# Patient Record
Sex: Male | Born: 1955 | Race: White | Hispanic: No | Marital: Married | Smoking: Current some day smoker
Health system: Southern US, Community
[De-identification: ages and names within clinical notes are randomized; demographics above are authoritative.]

## PROBLEM LIST (undated history)

## (undated) DIAGNOSIS — E7800 Pure hypercholesterolemia, unspecified: Secondary | ICD-10-CM

## (undated) DIAGNOSIS — G894 Chronic pain syndrome: Secondary | ICD-10-CM

## (undated) DIAGNOSIS — M549 Dorsalgia, unspecified: Secondary | ICD-10-CM

## (undated) DIAGNOSIS — J189 Pneumonia, unspecified organism: Secondary | ICD-10-CM

## (undated) DIAGNOSIS — R0600 Dyspnea, unspecified: Secondary | ICD-10-CM

## (undated) DIAGNOSIS — F419 Anxiety disorder, unspecified: Secondary | ICD-10-CM

## (undated) DIAGNOSIS — M1990 Unspecified osteoarthritis, unspecified site: Secondary | ICD-10-CM

## (undated) DIAGNOSIS — H3530 Unspecified macular degeneration: Secondary | ICD-10-CM

## (undated) DIAGNOSIS — R0609 Other forms of dyspnea: Secondary | ICD-10-CM

## (undated) DIAGNOSIS — I4439 Other atrioventricular block: Secondary | ICD-10-CM

## (undated) DIAGNOSIS — I10 Essential (primary) hypertension: Secondary | ICD-10-CM

## (undated) DIAGNOSIS — G8929 Other chronic pain: Secondary | ICD-10-CM

## (undated) DIAGNOSIS — I219 Acute myocardial infarction, unspecified: Secondary | ICD-10-CM

## (undated) DIAGNOSIS — I442 Atrioventricular block, complete: Secondary | ICD-10-CM

## (undated) DIAGNOSIS — I5033 Acute on chronic diastolic (congestive) heart failure: Secondary | ICD-10-CM

## (undated) DIAGNOSIS — J42 Unspecified chronic bronchitis: Secondary | ICD-10-CM

## (undated) DIAGNOSIS — J449 Chronic obstructive pulmonary disease, unspecified: Secondary | ICD-10-CM

## (undated) DIAGNOSIS — J439 Emphysema, unspecified: Secondary | ICD-10-CM

## (undated) DIAGNOSIS — Z8719 Personal history of other diseases of the digestive system: Secondary | ICD-10-CM

## (undated) DIAGNOSIS — K760 Fatty (change of) liver, not elsewhere classified: Secondary | ICD-10-CM

## (undated) DIAGNOSIS — I509 Heart failure, unspecified: Secondary | ICD-10-CM

## (undated) HISTORY — PX: JOINT REPLACEMENT: SHX530

## (undated) HISTORY — PX: BACK SURGERY: SHX140

## (undated) HISTORY — PX: CARDIAC CATHETERIZATION: SHX172

## (undated) HISTORY — DX: Other forms of dyspnea: R06.09

## (undated) HISTORY — PX: SHOULDER ARTHROSCOPY WITH ROTATOR CUFF REPAIR: SHX5685

## (undated) HISTORY — DX: Other atrioventricular block: I44.39

---

## 1986-12-09 HISTORY — PX: ANTERIOR CERVICAL DECOMP/DISCECTOMY FUSION: SHX1161

## 1998-10-18 ENCOUNTER — Encounter: Admission: RE | Admit: 1998-10-18 | Discharge: 1998-10-18 | Payer: Self-pay | Admitting: Family Medicine

## 1998-11-13 ENCOUNTER — Encounter: Admission: RE | Admit: 1998-11-13 | Discharge: 1998-11-13 | Payer: Self-pay | Admitting: Sports Medicine

## 1999-03-07 ENCOUNTER — Encounter: Admission: RE | Admit: 1999-03-07 | Discharge: 1999-03-07 | Payer: Self-pay | Admitting: Sports Medicine

## 1999-07-10 ENCOUNTER — Encounter: Admission: RE | Admit: 1999-07-10 | Discharge: 1999-07-10 | Payer: Self-pay | Admitting: Family Medicine

## 1999-08-09 ENCOUNTER — Encounter: Admission: RE | Admit: 1999-08-09 | Discharge: 1999-08-09 | Payer: Self-pay | Admitting: Family Medicine

## 1999-09-27 ENCOUNTER — Encounter: Admission: RE | Admit: 1999-09-27 | Discharge: 1999-09-27 | Payer: Self-pay | Admitting: Family Medicine

## 1999-11-23 ENCOUNTER — Ambulatory Visit (HOSPITAL_COMMUNITY): Admission: RE | Admit: 1999-11-23 | Discharge: 1999-11-23 | Payer: Self-pay | Admitting: Family Medicine

## 1999-11-23 ENCOUNTER — Encounter: Admission: RE | Admit: 1999-11-23 | Discharge: 1999-11-23 | Payer: Self-pay | Admitting: Family Medicine

## 1999-12-17 ENCOUNTER — Ambulatory Visit (HOSPITAL_COMMUNITY): Admission: RE | Admit: 1999-12-17 | Discharge: 1999-12-17 | Payer: Self-pay | Admitting: Sports Medicine

## 2000-01-07 ENCOUNTER — Encounter: Admission: RE | Admit: 2000-01-07 | Discharge: 2000-01-07 | Payer: Self-pay | Admitting: Family Medicine

## 2001-03-23 ENCOUNTER — Encounter: Admission: RE | Admit: 2001-03-23 | Discharge: 2001-03-23 | Payer: Self-pay | Admitting: Family Medicine

## 2001-04-09 ENCOUNTER — Encounter: Admission: RE | Admit: 2001-04-09 | Discharge: 2001-04-09 | Payer: Self-pay | Admitting: Family Medicine

## 2001-05-15 ENCOUNTER — Encounter: Admission: RE | Admit: 2001-05-15 | Discharge: 2001-05-15 | Payer: Self-pay | Admitting: Family Medicine

## 2002-03-02 ENCOUNTER — Ambulatory Visit (HOSPITAL_BASED_OUTPATIENT_CLINIC_OR_DEPARTMENT_OTHER): Admission: RE | Admit: 2002-03-02 | Discharge: 2002-03-02 | Payer: Self-pay | Admitting: Orthopaedic Surgery

## 2002-10-11 ENCOUNTER — Inpatient Hospital Stay (HOSPITAL_COMMUNITY): Admission: RE | Admit: 2002-10-11 | Discharge: 2002-10-12 | Payer: Self-pay | Admitting: Neurosurgery

## 2002-10-11 ENCOUNTER — Encounter: Payer: Self-pay | Admitting: Neurosurgery

## 2003-01-17 ENCOUNTER — Encounter: Admission: RE | Admit: 2003-01-17 | Discharge: 2003-01-17 | Payer: Self-pay | Admitting: Family Medicine

## 2003-02-09 ENCOUNTER — Encounter: Payer: Self-pay | Admitting: Emergency Medicine

## 2003-02-09 ENCOUNTER — Observation Stay (HOSPITAL_COMMUNITY): Admission: EM | Admit: 2003-02-09 | Discharge: 2003-02-10 | Payer: Self-pay | Admitting: Emergency Medicine

## 2003-02-23 ENCOUNTER — Encounter: Admission: RE | Admit: 2003-02-23 | Discharge: 2003-02-23 | Payer: Self-pay | Admitting: Family Medicine

## 2003-02-28 ENCOUNTER — Encounter: Admission: RE | Admit: 2003-02-28 | Discharge: 2003-02-28 | Payer: Self-pay | Admitting: Family Medicine

## 2003-03-14 ENCOUNTER — Encounter: Admission: RE | Admit: 2003-03-14 | Discharge: 2003-03-14 | Payer: Self-pay | Admitting: Family Medicine

## 2003-04-04 ENCOUNTER — Encounter: Admission: RE | Admit: 2003-04-04 | Discharge: 2003-04-04 | Payer: Self-pay | Admitting: Family Medicine

## 2003-04-26 ENCOUNTER — Encounter: Admission: RE | Admit: 2003-04-26 | Discharge: 2003-04-26 | Payer: Self-pay | Admitting: Family Medicine

## 2003-05-27 ENCOUNTER — Encounter: Admission: RE | Admit: 2003-05-27 | Discharge: 2003-05-27 | Payer: Self-pay | Admitting: Family Medicine

## 2003-06-29 ENCOUNTER — Encounter: Admission: RE | Admit: 2003-06-29 | Discharge: 2003-06-29 | Payer: Self-pay | Admitting: Family Medicine

## 2003-08-09 ENCOUNTER — Encounter: Admission: RE | Admit: 2003-08-09 | Discharge: 2003-08-09 | Payer: Self-pay | Admitting: Family Medicine

## 2003-09-08 ENCOUNTER — Encounter: Admission: RE | Admit: 2003-09-08 | Discharge: 2003-09-08 | Payer: Self-pay | Admitting: Family Medicine

## 2003-10-11 ENCOUNTER — Encounter: Admission: RE | Admit: 2003-10-11 | Discharge: 2003-10-11 | Payer: Self-pay | Admitting: Sports Medicine

## 2003-11-10 ENCOUNTER — Encounter: Admission: RE | Admit: 2003-11-10 | Discharge: 2003-11-10 | Payer: Self-pay | Admitting: Family Medicine

## 2003-12-15 ENCOUNTER — Encounter: Admission: RE | Admit: 2003-12-15 | Discharge: 2003-12-15 | Payer: Self-pay | Admitting: Family Medicine

## 2004-01-11 ENCOUNTER — Encounter: Admission: RE | Admit: 2004-01-11 | Discharge: 2004-01-11 | Payer: Self-pay | Admitting: Family Medicine

## 2004-02-24 ENCOUNTER — Encounter: Admission: RE | Admit: 2004-02-24 | Discharge: 2004-02-24 | Payer: Self-pay | Admitting: Sports Medicine

## 2004-03-27 ENCOUNTER — Encounter: Admission: RE | Admit: 2004-03-27 | Discharge: 2004-03-27 | Payer: Self-pay | Admitting: Family Medicine

## 2004-12-28 ENCOUNTER — Ambulatory Visit: Payer: Self-pay | Admitting: Sports Medicine

## 2005-06-10 ENCOUNTER — Ambulatory Visit: Payer: Self-pay | Admitting: Family Medicine

## 2005-07-23 ENCOUNTER — Ambulatory Visit: Payer: Self-pay | Admitting: Family Medicine

## 2005-09-13 ENCOUNTER — Ambulatory Visit: Payer: Self-pay | Admitting: Family Medicine

## 2005-12-30 ENCOUNTER — Ambulatory Visit: Payer: Self-pay | Admitting: Sports Medicine

## 2006-01-30 ENCOUNTER — Ambulatory Visit: Payer: Self-pay | Admitting: Family Medicine

## 2006-02-26 ENCOUNTER — Ambulatory Visit: Payer: Self-pay | Admitting: Family Medicine

## 2006-11-04 ENCOUNTER — Ambulatory Visit (HOSPITAL_BASED_OUTPATIENT_CLINIC_OR_DEPARTMENT_OTHER): Admission: RE | Admit: 2006-11-04 | Discharge: 2006-11-04 | Payer: Self-pay | Admitting: Orthopaedic Surgery

## 2007-01-07 ENCOUNTER — Ambulatory Visit: Payer: Self-pay | Admitting: Family Medicine

## 2007-02-16 ENCOUNTER — Ambulatory Visit: Payer: Self-pay | Admitting: Family Medicine

## 2007-02-16 ENCOUNTER — Encounter (INDEPENDENT_AMBULATORY_CARE_PROVIDER_SITE_OTHER): Payer: Self-pay | Admitting: *Deleted

## 2007-02-16 DIAGNOSIS — E7800 Pure hypercholesterolemia, unspecified: Secondary | ICD-10-CM | POA: Insufficient documentation

## 2007-02-16 DIAGNOSIS — I10 Essential (primary) hypertension: Secondary | ICD-10-CM | POA: Insufficient documentation

## 2007-02-18 ENCOUNTER — Encounter (INDEPENDENT_AMBULATORY_CARE_PROVIDER_SITE_OTHER): Payer: Self-pay | Admitting: *Deleted

## 2007-02-18 LAB — CONVERTED CEMR LAB
ALT: 18 units/L (ref 0–53)
AST: 16 units/L (ref 0–37)
Albumin: 4.2 g/dL (ref 3.5–5.2)
Alkaline Phosphatase: 65 units/L (ref 39–117)
BUN: 9 mg/dL (ref 6–23)
CO2: 21 meq/L (ref 19–32)
Calcium: 9.3 mg/dL (ref 8.4–10.5)
Chloride: 104 meq/L (ref 96–112)
Cholesterol: 181 mg/dL (ref 0–200)
Creatinine, Ser: 0.77 mg/dL (ref 0.40–1.50)
Glucose, Bld: 100 mg/dL — ABNORMAL HIGH (ref 70–99)
HDL: 32 mg/dL — ABNORMAL LOW (ref >39)
LDL Cholesterol: 113 mg/dL — ABNORMAL HIGH (ref 0–99)
Potassium: 4.9 meq/L (ref 3.5–5.3)
Sodium: 140 meq/L (ref 135–145)
TSH: 1.158 microintl units/mL (ref 0.350–5.50)
Total Bilirubin: 0.5 mg/dL (ref 0.3–1.2)
Total CHOL/HDL Ratio: 5.7
Total Protein: 7 g/dL (ref 6.0–8.3)
Triglycerides: 180 mg/dL — ABNORMAL HIGH (ref <150)
VLDL: 36 mg/dL (ref 0–40)

## 2007-04-07 ENCOUNTER — Ambulatory Visit: Payer: Self-pay | Admitting: Family Medicine

## 2007-04-07 ENCOUNTER — Encounter (INDEPENDENT_AMBULATORY_CARE_PROVIDER_SITE_OTHER): Payer: Self-pay | Admitting: *Deleted

## 2007-04-07 DIAGNOSIS — Z0000 Encounter for general adult medical examination without abnormal findings: Secondary | ICD-10-CM | POA: Insufficient documentation

## 2007-04-07 DIAGNOSIS — F17200 Nicotine dependence, unspecified, uncomplicated: Secondary | ICD-10-CM | POA: Insufficient documentation

## 2007-04-08 LAB — CONVERTED CEMR LAB: Uric Acid, Serum: 5.4 mg/dL (ref 2.4–7.0)

## 2007-05-07 ENCOUNTER — Telehealth (INDEPENDENT_AMBULATORY_CARE_PROVIDER_SITE_OTHER): Payer: Self-pay | Admitting: *Deleted

## 2007-05-27 ENCOUNTER — Inpatient Hospital Stay (HOSPITAL_COMMUNITY): Admission: EM | Admit: 2007-05-27 | Discharge: 2007-05-29 | Payer: Self-pay | Admitting: Family Medicine

## 2007-05-27 ENCOUNTER — Ambulatory Visit: Payer: Self-pay | Admitting: Family Medicine

## 2007-06-19 ENCOUNTER — Ambulatory Visit: Payer: Self-pay | Admitting: Family Medicine

## 2007-06-19 DIAGNOSIS — K219 Gastro-esophageal reflux disease without esophagitis: Secondary | ICD-10-CM | POA: Insufficient documentation

## 2007-06-22 ENCOUNTER — Ambulatory Visit: Payer: Self-pay | Admitting: Family Medicine

## 2007-06-25 ENCOUNTER — Telehealth (INDEPENDENT_AMBULATORY_CARE_PROVIDER_SITE_OTHER): Payer: Self-pay | Admitting: *Deleted

## 2007-06-25 ENCOUNTER — Ambulatory Visit: Payer: Self-pay | Admitting: Family Medicine

## 2007-09-23 ENCOUNTER — Ambulatory Visit: Payer: Self-pay | Admitting: Family Medicine

## 2007-09-23 ENCOUNTER — Telehealth (INDEPENDENT_AMBULATORY_CARE_PROVIDER_SITE_OTHER): Payer: Self-pay | Admitting: *Deleted

## 2007-10-07 ENCOUNTER — Ambulatory Visit: Payer: Self-pay | Admitting: Family Medicine

## 2007-10-15 ENCOUNTER — Ambulatory Visit: Payer: Self-pay | Admitting: Family Medicine

## 2007-10-15 ENCOUNTER — Encounter: Admission: RE | Admit: 2007-10-15 | Discharge: 2007-10-15 | Payer: Self-pay | Admitting: Sports Medicine

## 2007-10-15 ENCOUNTER — Encounter (INDEPENDENT_AMBULATORY_CARE_PROVIDER_SITE_OTHER): Payer: Self-pay | Admitting: Family Medicine

## 2007-10-15 LAB — CONVERTED CEMR LAB
Basophils Absolute: 0 10*3/uL (ref 0.0–0.1)
Basophils Relative: 0 % (ref 0–1)
Eosinophils Absolute: 0.2 10*3/uL (ref 0.0–0.7)
Eosinophils Relative: 2 % (ref 0–5)
HCT: 43.2 % (ref 39.0–52.0)
Hemoglobin: 14.1 g/dL (ref 13.0–17.0)
Lymphocytes Relative: 38 % (ref 12–46)
Lymphs Abs: 3.7 10*3/uL — ABNORMAL HIGH (ref 0.7–3.3)
MCHC: 32.6 g/dL (ref 30.0–36.0)
MCV: 93.5 fL (ref 78.0–100.0)
Monocytes Absolute: 0.9 10*3/uL — ABNORMAL HIGH (ref 0.2–0.7)
Monocytes Relative: 9 % (ref 3–11)
Neutro Abs: 4.9 10*3/uL (ref 1.7–7.7)
Neutrophils Relative %: 51 % (ref 43–77)
Platelets: 274 10*3/uL (ref 150–400)
RBC: 4.62 M/uL (ref 4.22–5.81)
RDW: 13.8 % (ref 11.5–14.0)
WBC: 9.7 10*3/uL (ref 4.0–10.5)

## 2007-11-13 ENCOUNTER — Ambulatory Visit: Payer: Self-pay | Admitting: Family Medicine

## 2008-01-06 ENCOUNTER — Telehealth: Payer: Self-pay | Admitting: *Deleted

## 2008-01-06 ENCOUNTER — Ambulatory Visit: Payer: Self-pay | Admitting: Family Medicine

## 2008-01-19 ENCOUNTER — Telehealth (INDEPENDENT_AMBULATORY_CARE_PROVIDER_SITE_OTHER): Payer: Self-pay | Admitting: *Deleted

## 2008-01-27 ENCOUNTER — Ambulatory Visit: Payer: Self-pay | Admitting: Family Medicine

## 2008-02-03 ENCOUNTER — Telehealth: Payer: Self-pay | Admitting: *Deleted

## 2008-02-17 ENCOUNTER — Telehealth (INDEPENDENT_AMBULATORY_CARE_PROVIDER_SITE_OTHER): Payer: Self-pay | Admitting: *Deleted

## 2008-02-27 ENCOUNTER — Telehealth (INDEPENDENT_AMBULATORY_CARE_PROVIDER_SITE_OTHER): Payer: Self-pay | Admitting: Family Medicine

## 2008-02-27 ENCOUNTER — Emergency Department (HOSPITAL_COMMUNITY): Admission: EM | Admit: 2008-02-27 | Discharge: 2008-02-27 | Payer: Self-pay | Admitting: Emergency Medicine

## 2008-04-20 ENCOUNTER — Encounter (INDEPENDENT_AMBULATORY_CARE_PROVIDER_SITE_OTHER): Payer: Self-pay | Admitting: *Deleted

## 2008-04-20 ENCOUNTER — Ambulatory Visit: Payer: Self-pay | Admitting: Family Medicine

## 2008-05-11 ENCOUNTER — Encounter (INDEPENDENT_AMBULATORY_CARE_PROVIDER_SITE_OTHER): Payer: Self-pay | Admitting: *Deleted

## 2008-05-26 ENCOUNTER — Encounter (INDEPENDENT_AMBULATORY_CARE_PROVIDER_SITE_OTHER): Payer: Self-pay | Admitting: *Deleted

## 2008-05-26 ENCOUNTER — Ambulatory Visit: Payer: Self-pay | Admitting: Family Medicine

## 2008-05-31 ENCOUNTER — Encounter (INDEPENDENT_AMBULATORY_CARE_PROVIDER_SITE_OTHER): Payer: Self-pay | Admitting: *Deleted

## 2008-07-13 ENCOUNTER — Telehealth (INDEPENDENT_AMBULATORY_CARE_PROVIDER_SITE_OTHER): Payer: Self-pay | Admitting: Family Medicine

## 2008-07-21 ENCOUNTER — Ambulatory Visit: Payer: Self-pay | Admitting: Family Medicine

## 2008-07-21 ENCOUNTER — Encounter (INDEPENDENT_AMBULATORY_CARE_PROVIDER_SITE_OTHER): Payer: Self-pay | Admitting: Family Medicine

## 2008-07-21 LAB — CONVERTED CEMR LAB

## 2008-07-22 LAB — CONVERTED CEMR LAB
ALT: 10 units/L (ref 0–53)
AST: 11 units/L (ref 0–37)
Albumin: 4.3 g/dL (ref 3.5–5.2)
Alkaline Phosphatase: 66 units/L (ref 39–117)
BUN: 7 mg/dL (ref 6–23)
CO2: 22 meq/L (ref 19–32)
Calcium: 9.2 mg/dL (ref 8.4–10.5)
Chloride: 107 meq/L (ref 96–112)
Creatinine, Ser: 0.54 mg/dL (ref 0.40–1.50)
Direct LDL: 85 mg/dL
Glucose, Bld: 94 mg/dL (ref 70–99)
PSA: 0.15 ng/mL (ref 0.10–4.00)
Potassium: 4.2 meq/L (ref 3.5–5.3)
Sodium: 140 meq/L (ref 135–145)
Total Bilirubin: 0.4 mg/dL (ref 0.3–1.2)
Total Protein: 7.1 g/dL (ref 6.0–8.3)

## 2008-08-09 ENCOUNTER — Telehealth (INDEPENDENT_AMBULATORY_CARE_PROVIDER_SITE_OTHER): Payer: Self-pay | Admitting: *Deleted

## 2008-08-25 ENCOUNTER — Ambulatory Visit: Payer: Self-pay | Admitting: Family Medicine

## 2008-08-25 DIAGNOSIS — M159 Polyosteoarthritis, unspecified: Secondary | ICD-10-CM

## 2008-09-29 ENCOUNTER — Telehealth (INDEPENDENT_AMBULATORY_CARE_PROVIDER_SITE_OTHER): Payer: Self-pay | Admitting: *Deleted

## 2008-11-21 ENCOUNTER — Telehealth: Payer: Self-pay | Admitting: *Deleted

## 2008-11-29 ENCOUNTER — Ambulatory Visit: Payer: Self-pay | Admitting: Family Medicine

## 2008-11-29 ENCOUNTER — Encounter (INDEPENDENT_AMBULATORY_CARE_PROVIDER_SITE_OTHER): Payer: Self-pay | Admitting: Family Medicine

## 2008-11-30 LAB — CONVERTED CEMR LAB
Albumin: 4.1 g/dL (ref 3.5–5.2)
Alkaline Phosphatase: 74 units/L (ref 39–117)
BUN: 6 mg/dL (ref 6–23)
Chloride: 106 meq/L (ref 96–112)
Creatinine, Ser: 0.58 mg/dL (ref 0.40–1.50)
Glucose, Bld: 92 mg/dL (ref 70–99)
HDL: 44 mg/dL (ref >39)
LDL Cholesterol: 65 mg/dL (ref 0–99)
Sodium: 141 meq/L (ref 135–145)
Total Bilirubin: 0.4 mg/dL (ref 0.3–1.2)
Total CHOL/HDL Ratio: 2.9
Triglycerides: 98 mg/dL (ref <150)

## 2008-12-14 ENCOUNTER — Telehealth (INDEPENDENT_AMBULATORY_CARE_PROVIDER_SITE_OTHER): Payer: Self-pay | Admitting: *Deleted

## 2008-12-30 ENCOUNTER — Ambulatory Visit: Payer: Self-pay | Admitting: Family Medicine

## 2009-02-08 ENCOUNTER — Ambulatory Visit: Payer: Self-pay | Admitting: Family Medicine

## 2009-02-24 ENCOUNTER — Telehealth (INDEPENDENT_AMBULATORY_CARE_PROVIDER_SITE_OTHER): Payer: Self-pay | Admitting: Family Medicine

## 2009-03-27 ENCOUNTER — Telehealth: Payer: Self-pay | Admitting: *Deleted

## 2009-04-25 ENCOUNTER — Telehealth: Payer: Self-pay | Admitting: *Deleted

## 2009-05-24 ENCOUNTER — Ambulatory Visit: Payer: Self-pay | Admitting: Family Medicine

## 2009-05-24 DIAGNOSIS — F411 Generalized anxiety disorder: Secondary | ICD-10-CM

## 2009-06-23 ENCOUNTER — Telehealth: Payer: Self-pay | Admitting: *Deleted

## 2009-07-03 ENCOUNTER — Ambulatory Visit: Payer: Self-pay | Admitting: Family Medicine

## 2009-07-03 ENCOUNTER — Telehealth: Payer: Self-pay | Admitting: Family Medicine

## 2009-07-04 ENCOUNTER — Telehealth: Payer: Self-pay | Admitting: Family Medicine

## 2009-07-13 ENCOUNTER — Ambulatory Visit: Payer: Self-pay | Admitting: Family Medicine

## 2009-07-19 ENCOUNTER — Encounter: Payer: Self-pay | Admitting: Family Medicine

## 2009-08-23 ENCOUNTER — Telehealth: Payer: Self-pay | Admitting: *Deleted

## 2009-10-13 ENCOUNTER — Telehealth: Payer: Self-pay | Admitting: *Deleted

## 2009-11-22 ENCOUNTER — Telehealth: Payer: Self-pay | Admitting: Family Medicine

## 2009-12-06 ENCOUNTER — Ambulatory Visit: Payer: Self-pay | Admitting: Family Medicine

## 2009-12-27 ENCOUNTER — Ambulatory Visit: Payer: Self-pay | Admitting: Family Medicine

## 2009-12-27 ENCOUNTER — Telehealth: Payer: Self-pay | Admitting: Family Medicine

## 2010-03-26 ENCOUNTER — Telehealth: Payer: Self-pay | Admitting: Family Medicine

## 2010-05-10 ENCOUNTER — Encounter: Payer: Self-pay | Admitting: Family Medicine

## 2010-05-22 ENCOUNTER — Telehealth: Payer: Self-pay | Admitting: *Deleted

## 2010-06-05 ENCOUNTER — Encounter: Payer: Self-pay | Admitting: Sports Medicine

## 2010-06-05 ENCOUNTER — Ambulatory Visit: Payer: Self-pay | Admitting: Family Medicine

## 2010-10-15 ENCOUNTER — Encounter: Payer: Self-pay | Admitting: Sports Medicine

## 2010-10-26 ENCOUNTER — Encounter: Payer: Self-pay | Admitting: Sports Medicine

## 2010-10-26 ENCOUNTER — Ambulatory Visit: Payer: Self-pay | Admitting: Family Medicine

## 2010-10-26 DIAGNOSIS — J449 Chronic obstructive pulmonary disease, unspecified: Secondary | ICD-10-CM | POA: Insufficient documentation

## 2010-10-26 LAB — CONVERTED CEMR LAB
ALT: 12 units/L (ref 0–53)
AST: 16 U/L (ref 0–37)
Albumin: 4.1 g/dL (ref 3.5–5.2)
Alkaline Phosphatase: 57 U/L (ref 39–117)
BUN: 5 mg/dL — ABNORMAL LOW (ref 6–23)
CO2: 24 meq/L (ref 19–32)
Calcium: 8.6 mg/dL (ref 8.4–10.5)
Chloride: 105 meq/L (ref 96–112)
Creatinine, Ser: 0.53 mg/dL (ref 0.40–1.50)
Direct LDL: 118 mg/dL — ABNORMAL HIGH
Glucose, Bld: 95 mg/dL (ref 70–99)
PSA: 0.24 ng/mL (ref <=4.00)
Potassium: 4 meq/L (ref 3.5–5.3)
Pro B Natriuretic peptide (BNP): 14.9 pg/mL (ref 0.0–100.0)
Sodium: 140 meq/L (ref 135–145)
Total Bilirubin: 0.4 mg/dL (ref 0.3–1.2)
Total Protein: 6.8 g/dL (ref 6.0–8.3)

## 2010-11-16 ENCOUNTER — Ambulatory Visit: Payer: Self-pay | Admitting: Family Medicine

## 2010-11-16 DIAGNOSIS — M6740 Ganglion, unspecified site: Secondary | ICD-10-CM

## 2010-12-10 ENCOUNTER — Emergency Department (HOSPITAL_BASED_OUTPATIENT_CLINIC_OR_DEPARTMENT_OTHER)
Admission: EM | Admit: 2010-12-10 | Discharge: 2010-12-10 | Payer: Self-pay | Source: Home / Self Care | Admitting: Emergency Medicine

## 2010-12-14 ENCOUNTER — Telehealth: Payer: Self-pay | Admitting: Sports Medicine

## 2010-12-17 ENCOUNTER — Encounter: Payer: Self-pay | Admitting: *Deleted

## 2010-12-25 ENCOUNTER — Ambulatory Visit: Admission: RE | Admit: 2010-12-25 | Discharge: 2010-12-25 | Payer: Self-pay | Source: Home / Self Care

## 2011-01-08 NOTE — Assessment & Plan Note (Signed)
Summary: mouth infection/bolden,df   Vital Signs:  Patient profile:   55 year old male Height:      67.75 inches Weight:      232 pounds BMI:     35.66 Temp:     98.4 degrees F oral Pulse rate:   72 / minute BP sitting:   158 / 98  (left arm) Cuff size:   regular  Vitals Entered By: Tessie Fass CMA (December 27, 2009 10:40 AM) CC: mouth infection Is Patient Diabetic? No Pain Assessment Patient in pain? yes     Location: dental Intensity: 9   Primary Care Provider:  Lequita Asal  MD  CC:  mouth infection.  History of Present Illness: 1) Tooth pain: Presents with acute LEFT mandibular and gingival pain.  Started yesterday wiith pain and swelling in left madibular region, relieved somewhat by vicodin and wamr compresses. History of chronic poor dentition w/ caries. Has appointment with dentist in February for extraction.  Pain is particularly bad with eating. Denies fever, emesis, nausea, dysphagia, difficulty swallowing, diarrhea, purulent drainage, eye pain.   Habits & Providers  Alcohol-Tobacco-Diet     Tobacco Status: current     Cigarette Packs/Day: 2.0  Current Medications (verified): 1)  Lisinopril 20 Mg Tabs (Lisinopril) .... Take 1 Tablet By Mouth Once A Day 2)  Lopressor 50 Mg  Tabs (Metoprolol Tartrate) .... One Tab By Mouth Bid 3)  Neurontin 300 Mg Caps (Gabapentin) .... One Tab By Mouth Qhs 4)  Nexium 40 Mg Cpdr (Esomeprazole Magnesium) .... Take 1 Capsule By Mouth Every Morning 5)  Simvastatin 40 Mg Tabs (Simvastatin) .Marland Kitchen.. 1 Tablet By Mouth Every Evening 6)  Hydrocodone-Acetaminophen 5-500 Mg Tabs (Hydrocodone-Acetaminophen) .Marland Kitchen.. 1-2 Tabs By Mouth Three Times A Day As Needed 7)  Fish Oil 1200 Mg  Caps (Omega-3 Fatty Acids) .... 2 Tablets By Mouth Daily 8)  Ranitidine Hcl 150 Mg Caps (Ranitidine Hcl) .Marland Kitchen.. 1 Tablet By Mouth Two Times A Day For Heartburn/reflux - Pt May Not Pick Up Right Away 9)  Buspirone Hcl 10 Mg Tabs (Buspirone Hcl) .... 1/2 Tablet By  Mouth Two Times A Day X 3 Days, Then 1 Tablet By Mouth Two Times A Day 10)  Ativan 0.5 Mg Tabs (Lorazepam) .Marland Kitchen.. 1 Tablet By Mouth As Needed For Severe Panic Attack 11)  Penicillin V Potassium 500 Mg Tabs (Penicillin V Potassium) .Marland Kitchen.. 1 Tablet By Mouth Three Times A Day X 10 Days For Dental Infection  Allergies (verified): 1)  Lasix (Furosemide) 2)  Sulfamethoxazole (Sulfamethoxazole) 3)  Fentanyl Citrate (Fentanyl Citrate) 4)  Hydrochlorothiazide (Hydrochlorothiazide)  Social History: Packs/Day:  2.0  Physical Exam  General:  Pleasant, cooperative, poorly groomed male in no acute distress Head:  no frontal or maxillary sinus pain w/ palpation  Mouth:  Edentulous maxilla.  Mandible with several molars missing.  Premolars on LEFT mandible are in various states of rotting.  Gingivitis. Tender to palpation w/o obvious abscess Neck:  Swollen LEFT neck with no fluctuance or tenderness Lungs:  Clear to auscultation bilaterally.  Normal work of breathing.  No wheezes, rales, or rhonchi.  Heart:  Regular rate and rhythm.  No murmur, rub, or gallop.  2+ Dorsalis Pedis pulses.  Neurologic:  alert & oriented X3 and cranial nerves II-XII intact.     Impression & Recommendations:  Problem # 1:  DENTAL PAIN (ICD-525.9) Assessment Deteriorated  No obvious drainable abscess. Will give PCN x10 days.  No narcotics provided, he is already prescribed this by Dr. Lanier Prude  and has refills available. Advised sooner follow up with dentist.  Orders: [facility]- Est Level  3 ([address]78)  Orders: [facility]- Est Level  3 ([address])  Complete Medication List: 1)  Lisinopril 20 Mg Tabs (Lisinopril) .... Take 1 tablet by mouth once a day 2)  Lopressor 50 Mg Tabs (Metoprolol tartrate) .... One tab by mouth bid 3)  Neurontin 300 Mg Caps (Gabapentin) .... One tab by mouth qhs 4)  Nexium 40 Mg Cpdr (Esomeprazole magnesium) .... Take 1 capsule by mouth every morning 5)  Simvastatin 40 Mg Tabs (Simvastatin) .Marland Kitchen.. 1 tablet by mouth  every evening 6)  Hydrocodone-acetaminophen 5-500 Mg Tabs (Hydrocodone-acetaminophen) .Marland Kitchen.. 1-2 tabs by mouth three times a day as needed 7)  Fish Oil 1200 Mg Caps (Omega-3 fatty acids) .... 2 tablets by mouth daily 8)  Ranitidine Hcl 150 Mg Caps (Ranitidine hcl) .Marland Kitchen.. 1 tablet by mouth two times a day for heartburn/reflux - pt may not pick up right away 9)  Buspirone Hcl 10 Mg Tabs (Buspirone hcl) .... 1/2 tablet by mouth two times a day x 3 days, then 1 tablet by mouth two times a day 10)  Ativan 0.5 Mg Tabs (Lorazepam) .Marland Kitchen.. 1 tablet by mouth as needed for severe panic attack 11)  Penicillin V Potassium 500 Mg Tabs (Penicillin v potassium) .Marland Kitchen.. 1 tablet by mouth three times a day x 10 days for dental infection  Patient Instructions: 1)  It was great to see you today!  2)  I have written a prescription for Penicillin, please take as instructed. 3)  Get to a dentist as soon as possible to get this taken care of. You run the risk of making this infection worse.  4)  It can be dangerous to your liver to take too much Vicodin.  Please only take as directed. Prescriptions: PENICILLIN V POTASSIUM 500 MG TABS (PENICILLIN V POTASSIUM) 1 tablet by mouth three times a day x 10 days for dental infection  #30 x 0   Entered and Authorized by:   Bobby Rumpf  MD   Signed by:   Bobby Rumpf  MD on 12/27/2009   Method used:   Electronically to        [facility] [address] [phone]* (retail)       [address] [place], [place]  [address]       Ph: [phone]       Fax: [phone]   RxID:   [id number]

## 2011-01-08 NOTE — Miscellaneous (Signed)
  Clinical Lists Changes  Problems: Removed problem of DENTAL PAIN (ICD-525.9) Removed problem of ANKLE PAIN, CHRONIC (ICD-719.47) Removed problem of KNEE PAIN, CHRONIC (ICD-719.46) Removed problem of SPECIAL SCREENING FOR MALIGNANT NEOPLASMS COLON (ICD-V76.51)

## 2011-01-08 NOTE — Progress Notes (Signed)
  Phone Note Call from Patient   Caller: Patient Summary of Call: Patient calls stating his mouth is infected again.  Has this happen to him frequently.  Requests to be added to the "work-in list" this morning.  Informed him I'd be happy to forward a note to our [place], our triage nurse, but that I can't put him in the schedule.  He'll still need to call at 8:30 to make the appointment.  Patient agreed.  Initial call taken by: Lamar Laundry, MD January 19th, 2010 5:35AM     Appended Document:  left message

## 2011-01-08 NOTE — Assessment & Plan Note (Signed)
Summary: f/up,tcb   Vital Signs:  Patient profile:   55 year old male Weight:      239.1 pounds Temp:     98.4 degrees F oral Pulse rate:   100 / minute Pulse rhythm:   regular BP sitting:   168 / 109  (right arm) Cuff size:   regular  Vitals Entered By: Loralee Pacas CMA (June 05, 2010 4:38 PM)  Primary Care Provider:  Lequita Asal  MD  CC:  f/u pain meds and HTN.  History of Present Illness: 1. Dental Pain - Has several bad teeth, but has been unable to see a dentist due to lack of insurance or employment.  Reports that one in particularly tends to flare up intermittantly and get infected.  currentlywith swelling, pain of L mandible. denies fever, chills, n/v  2. Chronic Knee Pain - Unchanged.  Has Osteoarthritis, diagnosed long ago, but hasn't had recent x-rays due to inability to afford them.  On chronic Vicodin for knee pain and multiple other sources of pain (back/neck).  Steroid injections have helped in the past and patient had been getting them Q3 months. .  He is willing to see an orthopedist once he has insurance again.  3. HTN- recent death of family member where patient had to perform CPR. denies chest pain, headaches, blurred vision, swelling.   Habits & Providers  Alcohol-Tobacco-Diet     Tobacco Status: current     Tobacco Counseling: to quit use of tobacco products     Cigarette Packs/Day: 2.0  Current Medications (verified): 1)  Lisinopril 20 Mg Tabs (Lisinopril) .... Take 1 Tablet By Mouth Once A Day 2)  Lopressor 50 Mg  Tabs (Metoprolol Tartrate) .... One Tab By Mouth Bid 3)  Simvastatin 40 Mg Tabs (Simvastatin) .Marland Kitchen.. 1 Tablet By Mouth Every Evening 4)  Fish Oil 1200 Mg  Caps (Omega-3 Fatty Acids) .... 2 Tablets By Mouth Daily 5)  Ranitidine Hcl 150 Mg Caps (Ranitidine Hcl) .Marland Kitchen.. 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 vitals reviewed.  Mouth:  poor dentition. bullous lesion along left lower gum line. +TTP Lungs:  Clear to auscultation bilaterally.  Normal work of breathing.  No wheezes, rales, or rhonchi.  Heart:  Regular rate and rhythm.  No murmur, rub, or gallop.  2+ Dorsalis Pedis pulses.  Extremities:  no edema of BLE   Impression & Recommendations:  Problem # 1:  HYPERTENSION, BENIGN (ICD-401.1) Assessment Deteriorated  likely 2/2 stress. no changes for now. patient refused labs.   His updated medication list for this problem includes:    Lisinopril 20 Mg Tabs (Lisinopril) .Marland Kitchen... Take 1 tablet by mouth once a day    Lopressor 50 Mg Tabs (Metoprolol tartrate) ..... One tab by mouth bid  Orders: [facility]- Est Level  3 ([address])  Problem # 2:  DENTAL PAIN (ICD-525.9) Assessment: Deteriorated  rx for penicillin and pain meds.  Orders: [facility]- Est Level  3 ([address])  Problem # 3:  OSTEOARTHRITIS, MULTIPLE JOINTS (ICD-715.89) Assessment: Unchanged  vicodin refilled; narc contract renewed.   His updated medication list for this problem includes:    Vicodin 5-500 Mg Tabs (Hydrocodone-acetaminophen) ..... One to two tabs by mouth three times a day as needed for pain. do not exceed 8 tablets a day.  Orders: [facility]- Est Level  3 ([address])  Patient Instructions: 1)  Follow up in 6 months  with Dr. [name] Prescriptions: VICODIN 5-500 MG TABS (HYDROCODONE-ACETAMINOPHEN) one to two tabs by mouth three times a day as needed for pain. DO NOT EXCEED 8 TABLETS A DAY.  #180 x 5   Entered and Authorized by:   Lequita Asal  MD   Signed by:   Lequita Asal  MD on 06/05/2010   Method used:   Telephoned to ...       [facility]  [address] [phone]* (retail)       [address]       [place], [place]  [address]       Ph: [phone] or [phone]       Fax: [phone]   RxID:   [id number] VICODIN 5-500 MG TABS (HYDROCODONE-ACETAMINOPHEN) one to two tabs by mouth three times a day as needed for pain. DO NOT EXCEED 8 TABLETS A DAY.  #180 x 3   Entered and Authorized by:   Lequita Asal  MD   Signed by:   Lequita Asal  MD on 06/05/2010   Method used:   Printed then faxed to ...       [facility] [address] [phone]* (retail)       [address]       [place], [place]  [address]       Ph: [phone] or [phone]       Fax: [phone]   RxID:   [id number]  Rxs called in by T. Laureen Ochs to [facility]   Prevention & Chronic Care Immunizations   Influenza vaccine: Not documented   Influenza vaccine deferral: Not available  (06/05/2010)    Tetanus booster: 06/13/2005: Done.   Tetanus booster due: 06/14/2015    Pneumococcal vaccine: Not documented  Colorectal Screening   Hemoccult: Not documented    Colonoscopy: Not documented  Other Screening   PSA: 0.15  (07/21/2008)   PSA due due: 07/21/2009   Smoking status: current  (06/05/2010)   Smoking cessation counseling: yes  (02/08/2009)  Lipids   Total Cholesterol: 129  (11/29/2008)   Lipid panel action/deferral: Refused   LDL: 65  (11/29/2008)   LDL Direct: 85  (07/21/2008)   HDL: 44  (11/29/2008)   Triglycerides: 98  (11/29/2008)    SGOT (AST): 9  (11/29/2008)   BMP action: Refused   SGPT (ALT): 10  (11/29/2008)   Alkaline phosphatase: 74  (11/29/2008)   Total  bilirubin: 0.4  (11/29/2008)    Lipid flowsheet reviewed?: Yes   Progress toward LDL goal: At goal  Hypertension   Last Blood Pressure: 168 / 109  (06/05/2010)   Serum creatinine: 0.58  (11/29/2008)   BMP action: Refused   Serum potassium 4.4  (11/29/2008)    Hypertension flowsheet reviewed?: Yes   Progress toward BP goal: Deteriorated  Self-Management Support :   Personal Goals (by the next clinic visit) :      Personal blood pressure goal: 140/90  (06/05/2010)     Personal LDL goal: 130  (06/05/2010)  Hypertension self-management support: Not documented    Lipid self-management support: Not documented

## 2011-01-08 NOTE — Progress Notes (Signed)
Summary: Rx Req  Phone Note Refill Request Call back at [phone] Message from:  George Leon  Refills Requested: Medication #1:  LISINOPRIL 20 MG TABS Take 1 tablet by mouth once a day  Medication #2:  RANITIDINE HCL 150 MG CAPS 1 tablet by mouth two times a day for heartburn/reflux - pt may not pick up right away.  Medication #3:  LOPRESSOR 50 MG  TABS one tab by mouth bid The first 3 listed above go to [facility]/ Simvastatin goes to [facility]/Hydrocodone #[facility] [address]  All the others he likes 90 days supply of.  Has a f/up on Tues the 28th.  He will be going out of town leaving this Saturday.  Initial call taken by: Clydell Hakim,  May 22, 2010 2:29 PM  Follow-up for Phone Call        wil not refill narcotic as I have not seen him since August of 2010.  Follow-up by: Lequita Asal  MD,  May 22, 2010 2:38 PM    New/Updated Medications: LISINOPRIL 20 MG TABS (LISINOPRIL) Take 1 tablet by mouth once a day LOPRESSOR 50 MG  TABS (METOPROLOL TARTRATE) one tab by mouth bid SIMVASTATIN 40 MG TABS (SIMVASTATIN) 1 tablet by mouth every evening RANITIDINE HCL 150 MG CAPS (RANITIDINE HCL) 1 tablet by mouth two times a day for heartburn/reflux Prescriptions: SIMVASTATIN 40 MG TABS (SIMVASTATIN) 1 tablet by mouth every evening  #90 x 0   Entered and Authorized by:   Lequita Asal  MD   Signed by:   Lequita Asal  MD on 05/22/2010   Method used:   Faxed to ...       [facility] [phone] (retail)       [address]       [place], [place]  [address]       Ph: [phone]       Fax: [phone]   RxID:   [id number] RANITIDINE HCL 150 MG CAPS (RANITIDINE HCL) 1 tablet by mouth two times a day for heartburn/reflux  #180 x 0   Entered and Authorized by:   Lequita Asal  MD   Signed by:   Lequita Asal  MD on 05/22/2010   Method used:   Electronically to        [facility] (retail)       [address]  [place], [place]  [address]       Ph: [phone]       Fax: [phone]   RxID:   [id number] LOPRESSOR 50 MG  TABS (METOPROLOL TARTRATE) one tab by mouth bid  #180 x 0   Entered and Authorized by:   Lequita Asal  MD   Signed by:   Lequita Asal  MD on 05/22/2010   Method used:   Electronically to        [facility] (retail)       [address]       [place], [place]  [address]       Ph: [phone]       Fax: [phone]   RxID:   [id number] LISINOPRIL 20 MG TABS (LISINOPRIL) Take 1 tablet by mouth once a day  #90 x 0   Entered and Authorized by:   Lequita Asal  MD   Signed by:   Lequita Asal  MD on 05/22/2010   Method used:  Electronically to        [facility] (retail)       [address]       [place], [place]  [address]       Ph: [phone]       Fax: [phone]   RxID:   [id number]  scripts called in to [facility] pt notified.Loralee Pacas CMA  May 22, 2010 4:34 PM

## 2011-01-08 NOTE — Miscellaneous (Signed)
Summary: NO MORE NARC REFILLS/SUMMARY  Patient has not been seen in several months for chronic problems. Previously prescribed Vicodin for chronic pain. Last refill in April 2011, with none further without appointment. No appointment made.  ***WOULD NOT REFILL ANYMORE WITHOUT APPOINTMENT***  Patient usually comes sporadically for dental pain/infection. Below is summary of two problems which concern patient most according to last PCP.  1. Dental Pain - Has several bad teeth, but has been unable to see a dentist due to lack of insurance or employment.  Reports that one in particularly tends to flare up intermittantly and get infected.    2. Chronic Knee Pain - Unchanged.  Has Osteoarthritis, diagnosed long ago, but hasn't had recent x-rays due to inability to afford them.  On chronic Vicodin for knee pain and multiple other sources of pain (back/neck).  Steroid injections have helped in the past and patient has been getting them Q3 months.  He is aware of the risks of repeated injections including accelerated cartilage deterioration and wishes to proceed.  He is willing to see an orthopedist once he has insurance again.  Medications: Removed medication of ATIVAN 0.5 MG TABS (LORAZEPAM) 1 tablet by mouth as needed for severe panic attack Removed medication of HYDROCODONE-ACETAMINOPHEN 5-500 MG TABS (HYDROCODONE-ACETAMINOPHEN) 1-2 tabs by mouth three times a day as needed Removed medication of BUSPIRONE HCL 10 MG TABS (BUSPIRONE HCL) 1/2 tablet by mouth two times a day x 3 days, then 1 tablet by mouth two times a day Removed medication of PENICILLIN V POTASSIUM 500 MG TABS (PENICILLIN V POTASSIUM) 1 tablet by mouth three times a day x 10 days for dental infection

## 2011-01-08 NOTE — Miscellaneous (Signed)
  Clinical Lists Changes  Medications: Removed medication of NEURONTIN 300 MG CAPS (GABAPENTIN) One tab by mouth qhs Removed medication of NEXIUM 40 MG CPDR (ESOMEPRAZOLE MAGNESIUM) Take 1 capsule by mouth every morning

## 2011-01-08 NOTE — Progress Notes (Signed)
Summary: Rx Req  Phone Note Refill Request Call back at [phone] Message from:  Patient  Refills Requested: Medication #1:  HYDROCODONE-ACETAMINOPHEN 5-500 MG TABS 1-2 tabs by mouth three times a day as needed Initial call taken by: Clydell Hakim,  March 26, 2010 2:32 PM  Follow-up for Phone Call        will send additional refill, but patient needs appt.  Follow-up by: Lequita Asal  MD,  March 26, 2010 2:42 PM  Additional Follow-up for Phone Call Additional follow up Details #1::        pt notified Rx at pharm and to call to sched apt before he needs refill, voiced understanding Additional Follow-up by: Gladstone Pih,  March 26, 2010 2:59 PM    Prescriptions: HYDROCODONE-ACETAMINOPHEN 5-500 MG TABS (HYDROCODONE-ACETAMINOPHEN) 1-2 tabs by mouth three times a day as needed  #180 x 0   Entered and Authorized by:   Lequita Asal  MD   Signed by:   Lequita Asal  MD on 03/26/2010   Method used:   Printed then faxed to ...       [facility] (retail)       [address]       [place], [place]  [address]       Ph: [phone]       Fax: [phone]   RxID:   [id number]

## 2011-01-08 NOTE — Miscellaneous (Signed)
Summary: [facility] Controlled Substance Contract  [facility] Controlled [facility]stance Contract   Imported By: Clydell Hakim 06/13/2010 16:01:06  _____________________________________________________________________  External Attachment:    Type:   Image     Comment:   External Document

## 2011-01-08 NOTE — Assessment & Plan Note (Signed)
Summary: f/u uri,df   Vital Signs:  Patient profile:   55 year old male Height:      67.75 inches Weight:      245.6 pounds BMI:     37.76 Temp:     98.4 degrees F oral Pulse rate:   83 / minute BP sitting:   154 / 93  (left arm) Cuff size:   large  Vitals Entered By: Jimmy Footman, CMA (November 16, 2010 2:44 PM) CC: URI follow up, pain med refill Is Patient Diabetic? No   Primary Care Provider:  Rodney Langton MD  CC:  URI follow up and pain med refill.  History of Present Illness: 55 yo male here fo fu.  Sinusitis:  Better/resolved s/p augmentin.  L wrist:  mass palpable lateral aspect.  Present a long time.  painful.  Chronic pain:  needs vicodin.  Habits & Providers  Alcohol-Tobacco-Diet     Tobacco Status: current     Tobacco Counseling: to quit use of tobacco products     Cigarette Packs/Day: 2.0     Year Quit: 2 weeks  Current Medications (verified): 1)  Lisinopril 20 Mg Tabs (Lisinopril) .... Take 1 Tablet By Mouth Once A Day 2)  Lopressor 100 Mg Tabs (Metoprolol Tartrate) .... One Tab By Mouth Two Times A Day 3)  Pravastatin Sodium 40 Mg Tabs (Pravastatin Sodium) .... One Tab By Mouth Daily 4)  Fish Oil 1200 Mg  Caps (Omega-3 Fatty Acids) .... 2 Tablets By Mouth Daily 5)  Ranitidine Hcl 150 Mg Caps (Ranitidine Hcl) .Marland Kitchen.. 1 Tablet By Mouth Two Times A Day For Heartburn/reflux 6)  Vicodin 5-500 Mg Tabs (Hydrocodone-Acetaminophen) .... One To Two Tabs By Mouth Three Times A Day As Needed For Pain. Do Not Exceed 8 Tablets A Day. 7)  Augmentin 500-125 Mg Tabs (Amoxicillin-Pot Clavulanate) .... One Tab By Mouth Two Times A Day X 10d 8)  Mucinex Maximum Strength 1200 Mg Xr12h-Tab (Guaifenesin) .... One Tab Po Two Times A Day 9)  Proair Hfa 108 (90 Base) Mcg/act Aers (Albuterol Sulfate) .... One To Two Puffs Q4h As Needed For Sob/wheeze/cough 10)  Prednisone 50 Mg Tabs (Prednisone) .... One Tab By Mouth Daily X 5d  Allergies (verified): 1)  Lasix  (Furosemide) 2)  Sulfamethoxazole (Sulfamethoxazole) 3)  Fentanyl Citrate (Fentanyl Citrate) 4)  Hydrochlorothiazide (Hydrochlorothiazide)  Review of Systems       See HPI  Physical Exam  General:  Well-developed,well-nourished,in no acute distress; alert,appropriate and cooperative throughout examination Lungs:  Normal respiratory effort, chest expands symmetrically. Lungs are clear to auscultation, no crackles or wheezes. Heart:  Normal rate and regular rhythm. S1 and S2 normal without gallop, murmur, click, rub or other extra sounds. Msk:  L Wrist: Inspection normal with no visible erythema or swelling. ROM smooth and normal with good flexion and extension and ulnar/radial deviation that is symmetrical with opposite wrist. Palpation is normal over metacarpals, navicular, lunate, and TFCC; tendons without tenderness.  Pt does have well defined, hard palpable/moveable mass over 1st extensor compartment at radiocarpal joint. Strength 5/5 in all directions without pain. Negative Finkelstein, tinel's and phalens.    Impression & Recommendations:  Problem # 1:  GANGLION CYST, WRIST, LEFT (ICD-727.41) Assessment New Will MSK U/S at next visit and if far enough from radial artery, will aspirate and then inject with kenalog.  Orders: [facility]- Est  Level 4 (56213)  Problem # 2:  HYPERTENSION, BENIGN (ICD-401.1) Assessment: Improved Better but not at goal Increasing lopressor.  His updated medication list for this problem includes:    Lisinopril 20 Mg Tabs (Lisinopril) .Marland Kitchen... Take 1 tablet by mouth once a day    Lopressor 100 Mg Tabs (Metoprolol tartrate) ..... One tab by mouth two times a day  Orders: [facility]- Est  Level 4 (25366)  Problem # 3:  OSTEOARTHRITIS, MULTIPLE JOINTS (ICD-715.89) Assessment: Unchanged Refilled vicodin.  His updated medication list for this problem includes:    Vicodin 5-500 Mg Tabs (Hydrocodone-acetaminophen) ..... One to two tabs by mouth three times a day  as needed for pain. do not exceed 8 tablets a day.  Complete Medication List: 1)  Lisinopril 20 Mg Tabs (Lisinopril) .... Take 1 tablet by mouth once a day 2)  Lopressor 100 Mg Tabs (Metoprolol tartrate) .... One tab by mouth two times a day 3)  Pravastatin Sodium 40 Mg Tabs (Pravastatin sodium) .... One tab by mouth daily 4)  Fish Oil 1200 Mg Caps (Omega-3 fatty acids) .... 2 tablets by mouth daily 5)  Ranitidine Hcl 150 Mg Caps (Ranitidine hcl) .Marland Kitchen.. 1 tablet by mouth two times a day for heartburn/reflux 6)  Vicodin 5-500 Mg Tabs (Hydrocodone-acetaminophen) .... One to two tabs by mouth three times a day as needed for pain. do not exceed 8 tablets a day. 7)  Augmentin 500-125 Mg Tabs (Amoxicillin-pot clavulanate) .... One tab by mouth two times a day x 10d 8)  Mucinex Maximum Strength 1200 Mg Xr12h-tab (Guaifenesin) .... One tab po two times a day 9)  Proair Hfa 108 (90 Base) Mcg/act Aers (Albuterol sulfate) .... One to two puffs q4h as needed for sob/wheeze/cough 10)  Prednisone 50 Mg Tabs (Prednisone) .... One tab by mouth daily x 5d  Patient Instructions: 1)  Increase lopressor to 100mg  two times a day. 2)  Refilled vicodin. 3)  Come back to look at the cyst on your wrist, I will ultrasound it and if it is far enough from the radial artery we will aspirate it. 4)  -Dr. Karie Schwalbe. Prescriptions: VICODIN 5-500 MG TABS (HYDROCODONE-ACETAMINOPHEN) one to two tabs by mouth three times a day as needed for pain. DO NOT EXCEED 8 TABLETS A DAY.  #180 x 0   Entered and Authorized by:   Rodney Langton MD   Signed by:   Rodney Langton MD on 11/16/2010   Method used:   Print then Give to Patient   RxID:   [id number] LOPRESSOR 100 MG TABS (METOPROLOL TARTRATE) One tab by mouth two times a day  #60 x 2   Entered and Authorized by:   Rodney Langton MD   Signed by:   Rodney Langton MD on 11/16/2010   Method used:   Print then Give to Patient   RxID:   [id number]    Orders  Added: 1)  [facility]- Est  Level 4 [30160]

## 2011-01-08 NOTE — Assessment & Plan Note (Signed)
Summary: meet new doc,df   Vital Signs:  Patient profile:   55 year old male Height:      67.75 inches Weight:      241 pounds BMI:     37.05 Temp:     98.2 degrees F oral Pulse rate:   94 / minute BP sitting:   179 / 109  (left arm) Cuff size:   large  Vitals Entered By: Jimmy Footman, CMA (October 26, 2010 9:16 AM) CC: URI, cough x10 days Is Patient Diabetic? No   Primary Care Provider:  Rodney Langton MD  CC:  URI and cough x10 days.  History of Present Illness: 55 yo male with MMP here for URI symptoms and cough.  Cough: Present 10 d, congestion, occasional epistaxis, subjective fevers/chills, NP cough, facial pain/pressure, double sickening present, R ear pain, no myalgias.  Habits & Providers  Alcohol-Tobacco-Diet     Tobacco Status: current  Current Medications (verified): 1)  Lisinopril 20 Mg Tabs (Lisinopril) .... Take 1 Tablet By Mouth Once A Day 2)  Lopressor 50 Mg  Tabs (Metoprolol Tartrate) .... One Tab By Mouth Bid 3)  Pravastatin Sodium 40 Mg Tabs (Pravastatin Sodium) .... One Tab By Mouth Daily 4)  Fish Oil 1200 Mg  Caps (Omega-3 Fatty Acids) .... 2 Tablets By Mouth Daily 5)  Ranitidine Hcl 150 Mg Caps (Ranitidine Hcl) .Marland Kitchen.. 1 Tablet By Mouth Two Times A Day For Heartburn/reflux 6)  Vicodin 5-500 Mg Tabs (Hydrocodone-Acetaminophen) .... One To Two Tabs By Mouth Three Times A Day As Needed For Pain. Do Not Exceed 8 Tablets A Day. 7)  Augmentin 500-125 Mg Tabs (Amoxicillin-Pot Clavulanate) .... One Tab By Mouth Two Times A Day X 10d 8)  Mucinex Maximum Strength 1200 Mg Xr12h-Tab (Guaifenesin) .... One Tab Po Two Times A Day 9)  Proair Hfa 108 (90 Base) Mcg/act Aers (Albuterol Sulfate) .... One To Two Puffs Q4h As Needed For Sob/wheeze/cough 10)  Prednisone 50 Mg Tabs (Prednisone) .... One Tab By Mouth Daily X 5d  Allergies (verified): 1)  Lasix (Furosemide) 2)  Sulfamethoxazole (Sulfamethoxazole) 3)  Fentanyl Citrate (Fentanyl Citrate) 4)   Hydrochlorothiazide (Hydrochlorothiazide)  Review of Systems       See HPI  Physical Exam  General:  Well-developed,well-nourished,in no acute distress; alert,appropriate and cooperative throughout examination Head:  Normocephalic and atraumatic without obvious abnormalities.  Eyes:  No corneal or conjunctival inflammation noted. EOMI. Perrl Ears:  External ear exam shows no significant lesions or deformities.  Otoscopic examination reveals clear canals, tympanic membranes are intact bilaterally without bulging, retraction, inflammation or discharge. Hearing is grossly normal bilaterally. Nose:  External nasal examination shows no deformity or inflammation. Nasal mucosa are pink and moist without lesions or exudates. Mouth:  Oral mucosa and oropharynx without lesions or exudates. Neck:  No deformities, masses, or tenderness noted. Lungs:  Diffuse instp and exp wheezing, no crackles or rales. Heart:  Normal rate and regular rhythm. S1 and S2 normal without gallop, murmur, click, rub or other extra sounds. Abdomen:  Bowel sounds positive,abdomen soft and non-tender without masses, organomegaly or hernias noted. Extremities:  1+ pitting edema.   Impression & Recommendations:  Problem # 1:  ACUTE SINUSITIS, UNSPECIFIED (ICD-461.9) Assessment New Augmentin as below. mucinex. RTC as needed.  His updated medication list for this problem includes:    Augmentin 500-125 Mg Tabs (Amoxicillin-pot clavulanate) ..... One tab by mouth two times a day x 10d    Mucinex Maximum Strength 1200 Mg Xr12h-tab (Guaifenesin) ..... One  tab po two times a day  Orders: [facility]- Est  Level 4 (46962)  Problem # 2:  COPD (ICD-496) Assessment: New No definitive diagnosis with PFTs however long history of smoking and current smoking, with symptoms is suggestive. he has no money and no bronchodilators or inhaled steroids and cannot afford such.  Makes too much money for [facility] per pt. Rx;ed albuterol. With LE  edema, checking BNP.  His updated medication list for this problem includes:    Proair Hfa 108 (90 Base) Mcg/act Aers (Albuterol sulfate) ..... One to two puffs q4h as needed for sob/wheeze/cough  Orders: [facility]- Est  Level 4 (99214)  Problem # 3:  SPECIAL SCREENING MALIGNANT NEOPLASM OF PROSTATE (ICD-V76.44) Assessment: New Ordered PSA.  Orders: PSA-[facility] (95284-13244)  Problem # 4:  SHORTNESS OF BREATH (ICD-786.05) Assessment: New See #2. Orders: B Nat Peptide-[facility] ([phone])  Problem # 5:  HYPERCHOLESTEROLEMIA (ICD-272.0) Assessment: Unchanged Checking lipids.  His updated medication list for this problem includes:    Pravastatin Sodium 40 Mg Tabs (Pravastatin sodium) ..... One tab by mouth daily  Orders: [facility]- Est  Level 4 (99214) Comp Met-[facility] (44034-74259) Direct LDL-[facility] (56387-56433)  Problem # 6:  HYPERTENSION, BENIGN (ICD-401.1) Assessment: Unchanged Very elevated however pt not taking any of his HTN or HLD medications.  His updated medication list for this problem includes:    Lisinopril 20 Mg Tabs (Lisinopril) .Marland Kitchen... Take 1 tablet by mouth once a day    Lopressor 50 Mg Tabs (Metoprolol tartrate) ..... One tab by mouth bid  Orders: [facility]- Est  Level 4 (99214) Comp Met-[facility] (29518-84166)  Complete Medication List: 1)  Lisinopril 20 Mg Tabs (Lisinopril) .... Take 1 tablet by mouth once a day 2)  Lopressor 50 Mg Tabs (Metoprolol tartrate) .... One tab by mouth bid 3)  Pravastatin Sodium 40 Mg Tabs (Pravastatin sodium) .... One tab by mouth daily 4)  Fish Oil 1200 Mg Caps (Omega-3 fatty acids) .... 2 tablets by mouth daily 5)  Ranitidine Hcl 150 Mg Caps (Ranitidine hcl) .Marland Kitchen.. 1 tablet by mouth two times a day for heartburn/reflux 6)  Vicodin 5-500 Mg Tabs (Hydrocodone-acetaminophen) .... One to two tabs by mouth three times a day as needed for pain. do not exceed 8 tablets a day. 7)  Augmentin 500-125 Mg Tabs (Amoxicillin-pot clavulanate) .... One tab by mouth two  times a day x 10d 8)  Mucinex Maximum Strength 1200 Mg Xr12h-tab (Guaifenesin) .... One tab po two times a day 9)  Proair Hfa 108 (90 Base) Mcg/act Aers (Albuterol sulfate) .... One to two puffs q4h as needed for sob/wheeze/cough 10)  Prednisone 50 Mg Tabs (Prednisone) .... One tab by mouth daily x 5d  Patient Instructions: 1)  Refilleds meds. 2)  Augmentin x 10d for sinusitis. 3)  Come back to see me in 2 weeks to discuss other medical problems. 4)  -Dr. Karie Schwalbe. Prescriptions: PREDNISONE 50 MG TABS (PREDNISONE) One tab by mouth daily x 5d  #5 x 0   Entered and Authorized by:   Rodney Langton MD   Signed by:   Rodney Langton MD on 10/26/2010   Method used:   Print then Give to Patient   RxID:   [id number] PRAVASTATIN SODIUM 40 MG TABS (PRAVASTATIN SODIUM) One tab by mouth daily  #90 x 0   Entered and Authorized by:   Rodney Langton MD   Signed by:   Rodney Langton MD on 10/26/2010   Method used:   Print then Give to Patient  RxID:   [id number] FISH OIL 1200 MG  CAPS (OMEGA-3 FATTY ACIDS) 2 tablets by mouth daily  #180 x 0   Entered and Authorized by:   Rodney Langton MD   Signed by:   Rodney Langton MD on 10/26/2010   Method used:   Print then Give to Patient   RxID:   [id number] RANITIDINE HCL 150 MG CAPS (RANITIDINE HCL) 1 tablet by mouth two times a day for heartburn/reflux  #180 x 0   Entered and Authorized by:   Rodney Langton MD   Signed by:   Rodney Langton MD on 10/26/2010   Method used:   Print then Give to Patient   RxID:   [id number] LOPRESSOR 50 MG  TABS (METOPROLOL TARTRATE) one tab by mouth bid  #180 x 0   Entered and Authorized by:   Rodney Langton MD   Signed by:   Rodney Langton MD on 10/26/2010   Method used:   Print then Give to Patient   RxID:   [id number] LISINOPRIL 20 MG TABS (LISINOPRIL) Take 1 tablet by mouth once a day  #90 x 0   Entered and Authorized by:   Rodney Langton  MD   Signed by:   Rodney Langton MD on 10/26/2010   Method used:   Print then Give to Patient   RxID:   [id number] PROAIR HFA 108 (90 BASE) MCG/ACT AERS (ALBUTEROL SULFATE) One to two puffs q4h as needed for SOB/wheeze/cough  #2 x 0   Entered and Authorized by:   Rodney Langton MD   Signed by:   Rodney Langton MD on 10/26/2010   Method used:   Print then Give to Patient   RxID:   [id number] MUCINEX MAXIMUM STRENGTH 1200 MG XR12H-TAB (GUAIFENESIN) One tab Po two times a day  #30 x 0   Entered and Authorized by:   Rodney Langton MD   Signed by:   Rodney Langton MD on 10/26/2010   Method used:   Print then Give to Patient   RxID:   [id number] AUGMENTIN 500-125 MG TABS (AMOXICILLIN-POT CLAVULANATE) One tab by mouth two times a day x 10d  #20 x 0   Entered and Authorized by:   Rodney Langton MD   Signed by:   Rodney Langton MD on 10/26/2010   Method used:   Print then Give to Patient   RxID:   [id number]    Orders Added: 1)  [facility]- Est  Level 4 [60737] 2)  Comp Met-[facility] [10626-94854] 3)  Direct LDL-[facility] [62703-50093] 4)  B Nat Peptide-[facility] [83880-55185] 5)  PSA-[facility] [81829-93716]

## 2011-01-10 NOTE — Assessment & Plan Note (Signed)
Summary: f/u ear/kh   Vital Signs:  Patient profile:   55 year old male Height:      67.75 inches Weight:      241.1 pounds BMI:     37.06 Temp:     98.6 degrees F oral Pulse rate:   71 / minute BP sitting:   143 / 84  (left arm) Cuff size:   large  Vitals Entered By: Garen Grams LPN (December 25, 2010 8:56 AM) CC: f/u wrist Is Patient Diabetic? No Pain Assessment Patient in pain? yes     Location: left ear   Primary Care Provider:  Rodney Langton MD  CC:  f/u wrist.  History of Present Illness: 55 yo male with L wrist ganglion cyst here for aspiration/injection.  Habits & Providers  Alcohol-Tobacco-Diet     Tobacco Status: current     Tobacco Counseling: to quit use of tobacco products     Cigarette Packs/Day: 2.0     Year Quit: 2 weeks  Current Medications (verified): 1)  Lisinopril 20 Mg Tabs (Lisinopril) .... Take 1 Tablet By Mouth Once A Day 2)  Lopressor 100 Mg Tabs (Metoprolol Tartrate) .... One Tab By Mouth Two Times A Day 3)  Pravastatin Sodium 40 Mg Tabs (Pravastatin Sodium) .... One Tab By Mouth Daily 4)  Fish Oil 1200 Mg  Caps (Omega-3 Fatty Acids) .... 2 Tablets By Mouth Daily 5)  Ranitidine Hcl 150 Mg Caps (Ranitidine Hcl) .Marland Kitchen.. 1 Tablet By Mouth Two Times A Day For Heartburn/reflux 6)  Vicodin 5-500 Mg Tabs (Hydrocodone-Acetaminophen) .... One To Two Tabs By Mouth Three Times A Day As Needed For Pain. Do Not Exceed 8 Tablets A Day. 7)  Augmentin 500-125 Mg Tabs (Amoxicillin-Pot Clavulanate) .... One Tab By Mouth Two Times A Day X 10d 8)  Mucinex Maximum Strength 1200 Mg Xr12h-Tab (Guaifenesin) .... One Tab Po Two Times A Day 9)  Proair Hfa 108 (90 Base) Mcg/act Aers (Albuterol Sulfate) .... One To Two Puffs Q4h As Needed For Sob/wheeze/cough 10)  Prednisone 50 Mg Tabs (Prednisone) .... One Tab By Mouth Daily X 5d  Allergies (verified): 1)  Lasix (Furosemide) 2)  Sulfamethoxazole (Sulfamethoxazole) 3)  Fentanyl Citrate (Fentanyl Citrate) 4)   Hydrochlorothiazide (Hydrochlorothiazide)  Review of Systems       See HPI  Physical Exam  General:  Well-developed,well-nourished,in no acute distress; alert,appropriate and cooperative throughout examination Msk:  L Wrist: Inspection normal with no visible erythema or swelling. ROM smooth and normal with good flexion and extension and ulnar/radial deviation that is symmetrical with opposite wrist. Palpation is normal over metacarpals, navicular, lunate, and TFCC; tendons without tenderness.  Pt does have well defined, hard palpable/moveable mass over 1st extensor compartment at radiocarpal joint. Strength 5/5 in all directions without pain. Negative Finkelstein, tinel's and phalens.  Additional Exam:  MSK US guided aspiration/injection  performed. Trans and long views taken, it is cystic, overlying the radiocarpal joint.  It is a good distance away from the radial artery, there are some compressible veins in the vicinity.  Verbal consent obtained. Area prepped with alcohol. 1cc lidocaine 1% infiltrated over surface. US probe reapplied, area cleaned again. 23g needle advanced into lesion under real time guidance, images saved. Attempted aspiration, no fluid obtained. Syringe switched and 1cc kenalog 40 injected into cyst, capsule seen distending. Area cleaned, gauze placed and ace wrapped. Aftercare advised. Images saved.    Impression & Recommendations:  Problem # 1:  GANGLION CYST, WRIST, LEFT (ICD-727.41) Assessment Improved Injected with  kenalog under real time US guidance. Will follow for resolution. Expect 50% to resolve and 50% to recur.  Orders: [place] NDL PLMT IMG S&I [phone]) Aspirate/Inject Ganglion Cyst (60454) [place] LIMITED (09811)  Complete Medication List: 1)  Lisinopril 20 Mg Tabs (Lisinopril) .... Take 1 tablet by mouth once a day 2)  Lopressor 100 Mg Tabs (Metoprolol tartrate) .... One tab by mouth two times a day 3)  Pravastatin Sodium 40 Mg Tabs  (Pravastatin sodium) .... One tab by mouth daily 4)  Fish Oil 1200 Mg Caps (Omega-3 fatty acids) .... 2 tablets by mouth daily 5)  Ranitidine Hcl 150 Mg Caps (Ranitidine hcl) .Marland Kitchen.. 1 tablet by mouth two times a day for heartburn/reflux 6)  Vicodin 5-500 Mg Tabs (Hydrocodone-acetaminophen) .... One to two tabs by mouth three times a day as needed for pain. do not exceed 8 tablets a day. 7)  Proair Hfa 108 (90 Base) Mcg/act Aers (Albuterol sulfate) .... One to two puffs q4h as needed for sob/wheeze/cough 8)  Nexium 40 Mg Cpdr (Esomeprazole magnesium) .... One tab by mouth at dinnertime. Prescriptions: NEXIUM 40 MG CPDR (ESOMEPRAZOLE MAGNESIUM) One tab by mouth at dinnertime.  #30 x 11   Entered and Authorized by:   Rodney Langton MD   Signed by:   Rodney Langton MD on 12/25/2010   Method used:   Handwritten   RxID:   [id number]    Orders Added: 1)  [place] NDL PLMT IMG S&I [78469] 2)  Aspirate/Inject Ganglion Cyst [20612] 3)  [place] LIMITED [62952]

## 2011-01-10 NOTE — Progress Notes (Signed)
Summary: Vicodin Refill  Phone Note Refill Request Call back at [phone]   Refills Requested: Medication #1:  VICODIN 5-500 MG TABS one to two tabs by mouth three times a day as needed for pain. DO NOT EXCEED 8 TABLETS A DAY. Call patient when ready.  Initial call taken by: Abundio Miu,  December 14, 2010 2:44 PM  Follow-up for Phone Call        Script at front. Follow-up by: Rodney Langton MD,  December 14, 2010 5:16 PM    Prescriptions: VICODIN 5-500 MG TABS (HYDROCODONE-ACETAMINOPHEN) one to two tabs by mouth three times a day as needed for pain. DO NOT EXCEED 8 TABLETS A DAY.  #180 x 0   Entered and Authorized by:   Rodney Langton MD   Signed by:   Rodney Langton MD on 12/14/2010   Method used:   Print then Give to Patient   RxID:   [id number]

## 2011-01-14 ENCOUNTER — Encounter: Payer: Self-pay | Admitting: Sports Medicine

## 2011-01-16 ENCOUNTER — Telehealth: Payer: Self-pay | Admitting: Sports Medicine

## 2011-01-16 MED ORDER — HYDROCODONE-ACETAMINOPHEN 5-500 MG PO TABS: 1.0000 | ORAL_TABLET | Freq: Four times a day (QID) | ORAL | Status: DC | PRN

## 2011-01-16 NOTE — Telephone Encounter (Signed)
Refilled, script at front.

## 2011-01-21 ENCOUNTER — Emergency Department (HOSPITAL_COMMUNITY): Payer: No Typology Code available for payment source

## 2011-01-21 ENCOUNTER — Emergency Department (HOSPITAL_COMMUNITY)
Admission: EM | Admit: 2011-01-21 | Discharge: 2011-01-21 | Disposition: A | Discharge status: Home / Self Care | Payer: No Typology Code available for payment source | Attending: Emergency Medicine | Admitting: Emergency Medicine

## 2011-01-21 DIAGNOSIS — M542 Cervicalgia: Secondary | ICD-10-CM | POA: Insufficient documentation

## 2011-01-21 DIAGNOSIS — E7800 Pure hypercholesterolemia, unspecified: Secondary | ICD-10-CM | POA: Insufficient documentation

## 2011-01-21 DIAGNOSIS — I700 Atherosclerosis of aorta: Secondary | ICD-10-CM | POA: Insufficient documentation

## 2011-01-21 DIAGNOSIS — S335XXA Sprain of ligaments of lumbar spine, initial encounter: Secondary | ICD-10-CM | POA: Insufficient documentation

## 2011-01-21 DIAGNOSIS — I10 Essential (primary) hypertension: Secondary | ICD-10-CM | POA: Insufficient documentation

## 2011-01-21 DIAGNOSIS — Z79899 Other long term (current) drug therapy: Secondary | ICD-10-CM | POA: Insufficient documentation

## 2011-01-21 DIAGNOSIS — M47817 Spondylosis without myelopathy or radiculopathy, lumbosacral region: Secondary | ICD-10-CM | POA: Insufficient documentation

## 2011-01-21 DIAGNOSIS — M47814 Spondylosis without myelopathy or radiculopathy, thoracic region: Secondary | ICD-10-CM | POA: Insufficient documentation

## 2011-01-24 NOTE — Miscellaneous (Signed)
  Clinical Lists Changes  Medications: Changed medication from LISINOPRIL 20 MG TABS (LISINOPRIL) Take 1 tablet by mouth once a day to ACCUPRIL 20 MG TABS (QUINAPRIL HCL) One tab by mouth daily - Signed Changed medication from PRAVASTATIN SODIUM 40 MG TABS (PRAVASTATIN SODIUM) One tab by mouth daily to LIPITOR 10 MG TABS (ATORVASTATIN CALCIUM) One tab by mouth daily - Signed Changed medication from LOPRESSOR 100 MG TABS (METOPROLOL TARTRATE) One tab by mouth two times a day to TOPROL XL 200 MG XR24H-TAB (METOPROLOL SUCCINATE) One tab by mouth daily - Signed Rx of ACCUPRIL 20 MG TABS (QUINAPRIL HCL) One tab by mouth daily;  #90 x 3;  Signed;  Entered by: Rodney Langton MD;  Authorized by: Rodney Langton MD;  Method used: Faxed to [facility], [address] [place], [place], [place]  [address], Ph: [phone], Fax: [phone] Rx of LIPITOR 10 MG TABS (ATORVASTATIN CALCIUM) One tab by mouth daily;  #90 x 3;  Signed;  Entered by: Rodney Langton MD;  Authorized by: Rodney Langton MD;  Method used: Faxed to [facility], [address] [place], [place], [place]  [address], Ph: [phone], Fax: [phone] Rx of TOPROL XL 200 MG XR24H-TAB (METOPROLOL SUCCINATE) One tab by mouth daily;  #90 x 3;  Signed;  Entered by: Rodney Langton MD;  Authorized by: Rodney Langton MD;  Method used: Faxed to [facility], [address] [place], [place], [place]  [address], Ph: [phone], Fax: [phone]    Prescriptions: TOPROL XL 200 MG XR24H-TAB (METOPROLOL SUCCINATE) One tab by mouth daily  #90 x 3   Entered and Authorized by:   Rodney Langton MD   Signed by:   Rodney Langton MD on 01/14/2011   Method used:   Faxed to ...       [facility] Department (retail)       [address] [place], [place]  [address]       Ph: [phone]       Fax: [phone]   RxID:   [id number] LIPITOR 10 MG TABS (ATORVASTATIN  CALCIUM) One tab by mouth daily  #90 x 3   Entered and Authorized by:   Rodney Langton MD   Signed by:   Rodney Langton MD on 01/14/2011   Method used:   Faxed to ...       [facility] Department (retail)       [address] [place], [place]  [address]       Ph: [phone]       Fax: [phone]   RxID:   [id number] ACCUPRIL 20 MG TABS (QUINAPRIL HCL) One tab by mouth daily  #90 x 3   Entered and Authorized by:   Rodney Langton MD   Signed by:   Rodney Langton MD on 01/14/2011   Method used:   Faxed to ...       [facility] Department (retail)       [address] [place], [place]  [address]       Ph: [phone]       Fax: [phone]   RxID:   [id number]

## 2011-02-12 ENCOUNTER — Encounter: Payer: Self-pay | Admitting: Family Medicine

## 2011-02-12 ENCOUNTER — Ambulatory Visit (INDEPENDENT_AMBULATORY_CARE_PROVIDER_SITE_OTHER): Payer: Self-pay | Admitting: Family Medicine

## 2011-02-12 DIAGNOSIS — S335XXA Sprain of ligaments of lumbar spine, initial encounter: Secondary | ICD-10-CM | POA: Insufficient documentation

## 2011-02-12 DIAGNOSIS — Z9189 Other specified personal risk factors, not elsewhere classified: Secondary | ICD-10-CM | POA: Insufficient documentation

## 2011-02-14 ENCOUNTER — Telehealth: Payer: Self-pay | Admitting: Sports Medicine

## 2011-02-14 ENCOUNTER — Other Ambulatory Visit: Payer: Self-pay | Admitting: Sports Medicine

## 2011-02-14 MED ORDER — HYDROCODONE-ACETAMINOPHEN 5-325 MG PO TABS: ORAL_TABLET | ORAL | Status: DC

## 2011-02-14 NOTE — Telephone Encounter (Signed)
Need rx for his Hydrocodone but need the miligrams changed from 5/500 to  50 5/ 325.

## 2011-02-14 NOTE — Telephone Encounter (Signed)
Script at front in envelope. 
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
## 2011-02-26 ENCOUNTER — Encounter: Payer: Self-pay | Admitting: Family Medicine

## 2011-02-26 ENCOUNTER — Ambulatory Visit (INDEPENDENT_AMBULATORY_CARE_PROVIDER_SITE_OTHER): Payer: Self-pay | Admitting: Family Medicine

## 2011-02-26 DIAGNOSIS — S335XXA Sprain of ligaments of lumbar spine, initial encounter: Secondary | ICD-10-CM

## 2011-02-26 DIAGNOSIS — Z9189 Other specified personal risk factors, not elsewhere classified: Secondary | ICD-10-CM

## 2011-02-26 MED ORDER — MELOXICAM 15 MG PO TABS: 15.0000 mg | ORAL_TABLET | Freq: Every day | ORAL | Status: DC
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
## 2011-03-01 ENCOUNTER — Telehealth: Payer: Self-pay | Admitting: Family Medicine
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
## 2011-03-04 ENCOUNTER — Encounter: Payer: Self-pay | Admitting: Family Medicine

## 2011-03-18 ENCOUNTER — Telehealth: Payer: Self-pay | Admitting: Sports Medicine

## 2011-03-18 NOTE — Telephone Encounter (Signed)
Needs refill on Hydrocodone °

## 2011-03-21 MED ORDER — HYDROCODONE-ACETAMINOPHEN 5-325 MG PO TABS: ORAL_TABLET | ORAL | Status: DC

## 2011-03-21 NOTE — Telephone Encounter (Signed)
Please call him at [phone] when ready - he called on Monday and needs this week.

## 2011-03-21 NOTE — Telephone Encounter (Signed)
Will provide enough to get him through until Dr. Karie Schwalbe gets back.  Rx ready up front.

## 2011-03-26 ENCOUNTER — Ambulatory Visit: Payer: No Typology Code available for payment source | Admitting: Family Medicine

## 2011-03-28 ENCOUNTER — Telehealth: Payer: Self-pay | Admitting: Sports Medicine

## 2011-03-28 NOTE — Telephone Encounter (Signed)
Needs refill on Hydrocodone 5/325 - will be out on Saturday -

## 2011-03-29 ENCOUNTER — Other Ambulatory Visit: Payer: Self-pay | Admitting: Sports Medicine

## 2011-03-29 MED ORDER — HYDROCODONE-ACETAMINOPHEN 5-325 MG PO TABS: ORAL_TABLET | ORAL | Status: DC

## 2011-03-29 NOTE — Telephone Encounter (Signed)
In envelope at front.  George Leon. [name], M.D.

## 2011-04-03 ENCOUNTER — Emergency Department (HOSPITAL_COMMUNITY): Payer: Self-pay

## 2011-04-03 ENCOUNTER — Emergency Department (HOSPITAL_COMMUNITY)
Admission: EM | Admit: 2011-04-03 | Discharge: 2011-04-03 | Disposition: A | Discharge status: Home / Self Care | Payer: Self-pay | Attending: Emergency Medicine | Admitting: Emergency Medicine

## 2011-04-03 DIAGNOSIS — I2510 Atherosclerotic heart disease of native coronary artery without angina pectoris: Secondary | ICD-10-CM | POA: Insufficient documentation

## 2011-04-03 DIAGNOSIS — I10 Essential (primary) hypertension: Secondary | ICD-10-CM | POA: Insufficient documentation

## 2011-04-03 DIAGNOSIS — R61 Generalized hyperhidrosis: Secondary | ICD-10-CM | POA: Insufficient documentation

## 2011-04-03 DIAGNOSIS — E669 Obesity, unspecified: Secondary | ICD-10-CM | POA: Insufficient documentation

## 2011-04-03 DIAGNOSIS — R091 Pleurisy: Secondary | ICD-10-CM | POA: Insufficient documentation

## 2011-04-03 DIAGNOSIS — R0602 Shortness of breath: Secondary | ICD-10-CM | POA: Insufficient documentation

## 2011-04-03 DIAGNOSIS — R0609 Other forms of dyspnea: Secondary | ICD-10-CM | POA: Insufficient documentation

## 2011-04-03 DIAGNOSIS — J4489 Other specified chronic obstructive pulmonary disease: Secondary | ICD-10-CM | POA: Insufficient documentation

## 2011-04-03 DIAGNOSIS — R0989 Other specified symptoms and signs involving the circulatory and respiratory systems: Secondary | ICD-10-CM | POA: Insufficient documentation

## 2011-04-03 DIAGNOSIS — R079 Chest pain, unspecified: Secondary | ICD-10-CM | POA: Insufficient documentation

## 2011-04-03 DIAGNOSIS — R112 Nausea with vomiting, unspecified: Secondary | ICD-10-CM | POA: Insufficient documentation

## 2011-04-03 DIAGNOSIS — J449 Chronic obstructive pulmonary disease, unspecified: Secondary | ICD-10-CM | POA: Insufficient documentation

## 2011-04-03 DIAGNOSIS — R609 Edema, unspecified: Secondary | ICD-10-CM | POA: Insufficient documentation

## 2011-04-03 DIAGNOSIS — Z79899 Other long term (current) drug therapy: Secondary | ICD-10-CM | POA: Insufficient documentation

## 2011-04-03 LAB — CBC
Hemoglobin: 13.7 g/dL (ref 13.0–17.0)
MCH: 31 pg (ref 26.0–34.0)
Platelets: 196 10*3/uL (ref 150–400)
RBC: 4.42 MIL/uL (ref 4.22–5.81)

## 2011-04-03 LAB — CK TOTAL AND CKMB (NOT AT ARMC)
CK, MB: 1.9 ng/mL (ref 0.3–4.0)
Total CK: 88 U/L (ref 7–232)

## 2011-04-03 LAB — BASIC METABOLIC PANEL
CO2: 26 mEq/L (ref 19–32)
Chloride: 107 mEq/L (ref 96–112)
Glucose, Bld: 90 mg/dL (ref 70–99)
Potassium: 4 mEq/L (ref 3.5–5.1)
Sodium: 139 mEq/L (ref 135–145)

## 2011-04-03 LAB — DIFFERENTIAL
Basophils Absolute: 0.1 10*3/uL (ref 0.0–0.1)
Basophils Relative: 0 % (ref 0–1)
Eosinophils Absolute: 0.2 10*3/uL (ref 0.0–0.7)
Monocytes Relative: 8 % (ref 3–12)
Neutro Abs: 7 10*3/uL (ref 1.7–7.7)
Neutrophils Relative %: 51 % (ref 43–77)

## 2011-04-03 LAB — TROPONIN I: Troponin I: 0.02 ng/mL (ref 0.00–0.06)

## 2011-04-08 ENCOUNTER — Ambulatory Visit (INDEPENDENT_AMBULATORY_CARE_PROVIDER_SITE_OTHER): Payer: Self-pay | Admitting: Sports Medicine

## 2011-04-08 ENCOUNTER — Encounter: Payer: Self-pay | Admitting: Sports Medicine

## 2011-04-08 ENCOUNTER — Ambulatory Visit: Payer: No Typology Code available for payment source | Admitting: Sports Medicine

## 2011-04-08 VITALS — BP 138/90 | HR 69 | Temp 98.1°F | Ht 69.0 in | Wt 234.0 lb

## 2011-04-08 DIAGNOSIS — R5381 Other malaise: Secondary | ICD-10-CM

## 2011-04-08 DIAGNOSIS — R5383 Other fatigue: Secondary | ICD-10-CM

## 2011-04-08 DIAGNOSIS — E349 Endocrine disorder, unspecified: Secondary | ICD-10-CM | POA: Insufficient documentation

## 2011-04-08 DIAGNOSIS — M6740 Ganglion, unspecified site: Secondary | ICD-10-CM

## 2011-04-08 LAB — COMPREHENSIVE METABOLIC PANEL
ALT: 9 U/L (ref 0–53)
AST: 12 U/L (ref 0–37)
Albumin: 4.3 g/dL (ref 3.5–5.2)
Alkaline Phosphatase: 59 U/L (ref 39–117)
Calcium: 9.4 mg/dL (ref 8.4–10.5)
Chloride: 103 mEq/L (ref 96–112)
Potassium: 4.3 mEq/L (ref 3.5–5.3)

## 2011-04-08 MED ORDER — HYDROCODONE-ACETAMINOPHEN 5-325 MG PO TABS: ORAL_TABLET | ORAL | Status: DC

## 2011-04-08 NOTE — Assessment & Plan Note (Addendum)
Pt agrees to further discuss this at next office visit. Will go ahead and check some prelim bloodwork. CBC normal 2d ago in ED. CMET, testosterone, TSH.

## 2011-04-08 NOTE — Assessment & Plan Note (Signed)
Recurrent. Last inj/asp 8 months ago. Pt prefers re-injection, then if recurs again can send to hand surgery for excision.

## 2011-04-08 NOTE — Progress Notes (Signed)
  Subjective:    Patient ID: REG BIRCHER, male    DOB: [birth year], 55 y.o.   MRN: [id number]  HPI Comes in for tx of his L wrist ganglion cyst. Last inj 12/2010.  Symptoms have been gone for 4 months.  No fevers/chills.  No trauma.   Review of Systems    See HPI Objective:   Physical Exam     General:  Well-developed,well-nourished,in no acute distress; alert,appropriate and cooperative throughout examination Msk:  L Wrist: Inspection normal with no visible erythema or swelling. ROM smooth and normal with good flexion and extension and ulnar/radial deviation that is symmetrical with opposite wrist. Palpation is normal over metacarpals, navicular, lunate, and TFCC; tendons without tenderness.  Pt does have well defined, hard palpable/moveable mass over 1st extensor compartment at radiocarpal joint. Strength 5/5 in all directions without pain. Negative Finkelstein, tinel's and phalens.  No overlying erythema, induration. Additional Exam:   MSK US  performed. Trans and long views taken, it is cystic, overlying the radiocarpal joint.  It is a good distance away from the radial artery, there are some compressible veins in the vicinity.  Written consent obtained. Area prepped with alcohol. 2cc lidocaine 1% infiltrated over surface. 25g needle advanced into cyst. Attempted aspiration, no fluid obtained. 0.5cc kenalog 40 injected into cyst. Area cleaned, bandaid applied Aftercare advised.    Assessment & Plan:

## 2011-04-08 NOTE — Patient Instructions (Signed)
Injected your wrist. Checking some bloodwork. Come back to see me to discuss the fatigue.  George Leon. [name], M.D.

## 2011-04-09 ENCOUNTER — Telehealth: Payer: Self-pay | Admitting: *Deleted

## 2011-04-09 LAB — TESTOSTERONE, FREE, TOTAL, SHBG: Testosterone-% Free: 1.9 % (ref 1.6–2.9)

## 2011-04-09 NOTE — Telephone Encounter (Signed)
Message copied by Jimmy Footman on Tue Apr 09, 2011  9:46 AM ------      Message from: Rodney Langton      Created: Tue Apr 09, 2011  9:04 AM       Pls let Darious know his testosterone levels were normal but in the lower part of the normal range.  We will discuss this at his next office visit.      Ihor Austin. [name], M.D.

## 2011-04-09 NOTE — Telephone Encounter (Signed)
LVM for patient to call back to inform of below 

## 2011-04-09 NOTE — Telephone Encounter (Signed)
Message copied by Jimmy Footman on Tue Apr 09, 2011  9:45 AM ------      Message from: Rodney Langton      Created: Tue Apr 09, 2011  9:04 AM       Pls let Jylan know his testosterone levels were normal but in the lower part of the normal range.  We will discuss this at his next office visit.      Ihor Austin. [name], M.D.

## 2011-04-09 NOTE — Telephone Encounter (Signed)
LVM for patient to call back. ?

## 2011-04-10 NOTE — Telephone Encounter (Signed)
LVM for patient to call back. ?

## 2011-04-11 ENCOUNTER — Ambulatory Visit: Payer: No Typology Code available for payment source | Admitting: Family Medicine

## 2011-04-11 NOTE — Telephone Encounter (Signed)
Will send letter to patient due to being unable to reach numerous times

## 2011-04-18 ENCOUNTER — Ambulatory Visit (INDEPENDENT_AMBULATORY_CARE_PROVIDER_SITE_OTHER): Payer: Self-pay | Admitting: Family Medicine

## 2011-04-18 VITALS — BP 152/92

## 2011-04-18 DIAGNOSIS — S335XXA Sprain of ligaments of lumbar spine, initial encounter: Secondary | ICD-10-CM
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
## 2011-04-22 ENCOUNTER — Ambulatory Visit (INDEPENDENT_AMBULATORY_CARE_PROVIDER_SITE_OTHER): Payer: Self-pay | Admitting: Sports Medicine

## 2011-04-22 ENCOUNTER — Encounter: Payer: Self-pay | Admitting: Sports Medicine

## 2011-04-22 DIAGNOSIS — R5383 Other fatigue: Secondary | ICD-10-CM

## 2011-04-22 DIAGNOSIS — M159 Polyosteoarthritis, unspecified: Secondary | ICD-10-CM

## 2011-04-22 DIAGNOSIS — R5381 Other malaise: Secondary | ICD-10-CM

## 2011-04-22 MED ORDER — TESTOSTERONE 12.5 MG/ACT (1%) TD GEL: 4.0000 | Freq: Every day | TRANSDERMAL | Status: DC

## 2011-04-22 NOTE — Assessment & Plan Note (Signed)
Injected both knees,  RTC prn.

## 2011-04-22 NOTE — Patient Instructions (Addendum)
Great to see you. Script for testosterone. Come back to see [place] to recheck testosterone in 3 months if you get the medication. Injected both knees.  George Leon. [name], M.D.

## 2011-04-22 NOTE — Progress Notes (Signed)
  Subjective:    Patient ID: George Leon, male    DOB: [birth year], 55 y.o.   MRN: [id number]  HPI Pt comes in for f/u of labs:  Fatigue:  Recent labs with normal CMET, TSH, CBC in the past, testosterone total was low.  He had been having a generalized sensation of fatigue.  Lack of sexual desire, function.  Interested in testo supplementation.  Knee pain:  Bilateral, for decades, has been getting injections by Dr. Deirdre Peer, last injection was over a year ago.  Pain is bilateral, worse with walking, better with rest, dull, located inside the knee.  No trauma.   Review of Systems    See HPI Objective:   Physical Exam  Constitutional: He appears well-developed and well-nourished.  Skin: Skin is warm and dry.   Consent obtained and verified. Time-out conducted. Noted no overlying erythema, induration, or other signs of local infection. Sterile betadine prep. Furthur cleansed with alcohol. Topical analgesic spray: Ethyl chloride. Joint: Bilateral knees Approached in typical fashion with: 25g needle, medial approach. Completed without difficulty Meds: 1cc kenalog 40, 2 cc lidocaine 2% no epi. Advised to call if fevers/chills, erythema, induration, drainage, or persistent bleeding.     Assessment & Plan:

## 2011-04-22 NOTE — Assessment & Plan Note (Signed)
Will replete. Androgel 4 pumps daily. Recheck q3 months until testosterone stable >500. Then may space out to q 6 months. Recent PSA 10/26/10: 0.24.

## 2011-04-23 NOTE — Cardiovascular Report (Signed)
NAMEJERMEL, George Leon NO.:  [id number]   MEDICAL RECORD NO.:  [id number]          PATIENT TYPE:  INP   LOCATION:  3710                         FACILITY:  [facility]   PHYSICIAN:  Madaline Savage, M.D.DATE OF BIRTH:  06/19/1956   DATE OF PROCEDURE:  05/29/2007  DATE OF DISCHARGE:                            CARDIAC CATHETERIZATION   The patient is a 55 year old white gentleman who went to the Urgent Care  Center for chest pain, graded 4 on a scale from 1-10.  He was  hospitalized here by the hospitalist service members and his subsequent  cardiac enzymes have been negative.  He had a similar presentation for  chest pain, tobacco use, incomplete right bundle branch block, and  obesity back in March 2008.  Today, he presents to the cath lab.  The  procedure was completed without complications.  No intervention was  required.   RESULTS:  Pressures the left ventricular pressure was 130/11, end-  diastolic pressure is 20, central aortic pressure 130/78, mean of 102.  No aortic valve gradient by pullback technique.   ANGIOGRAPHIC RESULTS:  The patient's coronary arteries were patent  throughout.   Left main coronary artery was medium in length and about 4.5-mm in  diameter.  No lesions were seen.  LAD coursed to the cardiac apex giving  rise to one major diagonal branch and a second smaller diagonal branch  and two tiny diagonal branches distally.  The LAD bifurcated at the  apex.  There was one major septal perforator branch.  No lesions were  seen in the entirety of the LAD coronary system.   Left circumflex coronary artery gave rise to a single obtuse marginal  branch.  The circumflex itself proximally mid and distal was normal and  terminated as a small posterolateral branch.  There was an atrial  circumflex branch arising from the circumflex as well that was normal.   Right coronary artery was a large and dominant vessel approximately 5-mm  in diameter.  No  lesions were seen throughout the vessel.  There was a  shepherd's crook deformity proximally, some tortuosity in the  posterolateral branch.  No lesions seen throughout the dominant RCA.   The left ventricle showed an ejection fraction of 50-60%, qualitatively  estimated.  There were no wall motion abnormalities and no evidence of  mitral regurgitation.   FINAL DIAGNOSES:  1. Angiographically patent coronary arteries with normal left      ventricular systolic function.  2. Multiple causes for shortness of breath including tobacco smoking,      obesity, and an elevated blood pressure.   PLAN:  Per hospitalist group.           ______________________________  Madaline Savage, M.D.     WHG/MEDQ  D:  05/29/2007  T:  05/29/2007  Job:  [id number]   cc:   Etta Grandchild, M.D.  [facility]

## 2011-04-23 NOTE — H&P (Signed)
NAMETITUS, DRONE NO.:  [id number]   MEDICAL RECORD NO.:  [id number]          PATIENT TYPE:  INP   LOCATION:  3710                         FACILITY:  [facility]   PHYSICIAN:  Johney Maine, M.D.   DATE OF BIRTH:  01-22-1956   DATE OF ADMISSION:  05/27/2007  DATE OF DISCHARGE:                              HISTORY & PHYSICAL   CHIEF COMPLAINT:  Chest pain, dizziness, left arm pain.   HISTORY OF PRESENT ILLNESS:  This is a 55 year old male who has not  been feeling right for the  past 3 days.  At 8:00 a.m. this morning he  had an episode of chest pain with dizziness and left arm pain.  At this  time he was sitting at his desk.  He went to Urgent Care for this chest  pain which he described as a dull ache and rated it a 4/10.  However, it  got as high as an 8/10.  The patient said that the dull ache then  radiated to his left arm with tingling.  This pain is different than his  usual tingling of his arms because it was in different fingers and felt  different.  The patient also had some sweating at this time with the  chest pain.  However, that is no longer there.  Later this afternoon he  complained of chills.  Patient says IV morphine helped the pain;  however, the nitroglycerin did not.  He had an episode of nausea that  was mild;  however, no vomiting and now the nausea is gone.  The patient  also had some abdominal pain this afternoon.  The patient was short of  breath chronically; however, his wife feels that he may have a little  more short of breath earlier with this episode of chest pain..  The  patient states that his chest pain occurred sitting and at rest and was  not worse with exertion and nothing relieved it.  He says it is been off  and on for few days; however, this morning was worse.  During the  interview,  he also commented the patient the pain almost feels like a  muscle pull at times.   PAST MEDICAL HISTORY:  1. Hypertension.  2.  Hypertriglyceridemia.  3. 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  4. He had disk removal at C4-C5 with plate and he has neuropathic      chronic pain   MEDICATIONS:  1. Amlodipine 10 mg p.o. daily.  2. Lisinopril 20 mg p.o. daily.  3. Neurontin 300 mg q.i.d. per patient.  4. Norco 10/325 mg t.i.d.  5. Nexium daily.   SURGERIES:  1. Disk removal with plate insertion.  2. Arthroscopic surgery of the right shoulder.  3. Plantar fasciitis surgery.   SOCIAL HISTORY:  He smokes Smokes two packs a day for 35 years and no  desire to quit.  He is  a social drinker with one every 2 weeks at most  although he has a significant history of alcohol use in the remote past.  He denies drug use.  Also has a past history of this in his younger  years.  He lives with his wife.   FAMILY HISTORY:  Father with a ruptured aortic aneurysm.  He has  multiple family members with either aortic aneurysm or brain aneurysm  and was told that this skips a generation and his son needs a work-up.  No other known heart history.   ALLERGIES:  Is allergic to SULFA MEDICATIONS and he gets anaphylaxis.   REVIEW OF SYSTEMS:  As in HPI with the following additions:  Positive  numbness and tingling on his middle fingers and little fingers of both  hands.  This is chronic.  He denies bowel movement changes.  He denies  tarry stools.  He denies blood in stools.  He denies weight loss.  He  denies dysuria.   PHYSICAL EXAMINATION:  VITAL SIGNS:  His temperature 96.6 with a T-max  of  97.3.  Heart rate ranges from 88 to 104, blood pressure is 136/88  with a high of 157/107.  Respiratory rate is 18.  His oxygen is 98 on  room air.   PHYSICAL EXAMINATION:  GENERAL:  Not in acute distress.  HEENT:  Pupils equal, round, react to light and accommodation.  Extraocular muscles intact.  Throat is  without erythema.  He has moist  mucous membranes.  NECK:  Negative lymphadenopathy.  Negative thyromegaly.  CARDIOVASCULAR:  Regular rate and rhythm.  No rubs, gallops or murmurs.  CHEST WALL:  Nonreproducible chest pain, nontender.  PULMONARY:  Clear to auscultation bilaterally.  ABDOMEN:  Soft.  Mild tenderness to palpation in the right middle  section of the abdomen;  however, nonspecific quadrant.  MUSCULOSKELETAL:  Shows 5/5 upper and lower strength bilaterally.  Full  range of movement.  NEUROLOGIC:  Cranial nerves II-XII grossly intact.  Cerebellar function  intact.  He is alert and oriented x3.  SKIN:  There are no lesions or rashes.   LABORATORY DATA:  CMP:  Sodium 135, potassium 3.8, chloride 105, bicarb  21, BUN 4, creatinine 0.79, glucose 90, calcium 8.9, albumin 3.9, AST  19, ALT 20.  CBC:  White blood cells 13.7, hemoglobin 15.2, hematocrit  44.6, platelets 239,000, 64% neutrophils,  ANC mildly elevated at 8.8.  Point of care enzymes were all negative.  Troponin less than 0.05.  CK-  MB less than 1, myoglobin 55, INR 0.9.  ESR is 3.  Chest x-ray shows no  active cardiopulmonary disease.  A D-dimer is negative at less than  0.22.  EKG shows normal sinus rhythm with a right bundle branch block,  unchanged from previous exam.  A fasting lipid panel performed on February 16, 2007 in the [facility] showed total cholesterol 181, HDL  32, LDL 115, triglycerides 180.   ASSESSMENT/PLAN:  This is a 55 year old male with atypical chest pain.  1. Chest pain atypical nature.  Differential diagnosis of cardiac, GI      versus musculoskeletal etiology.  Will admit for rule out given his      questionable history of myocardial infarction.  Will do cardiac      enzymes q.6 h x2.  He has already had three negative point of cares      in the emergency department.  We will get an EKG in the morning  although the current one shows no change.  Will start aspirin.  We      will  try a GI cocktail if this offers relief.  Will continue the      patient's Nexium.  Though likely not cardiac in nature given      negative point of cares and no EKG changes and no relief with      nitroglycerin, fasting lipid panel was reasonable.  If cardiac      enzymes are negative, likely okay for discharge.  Will check a      morning EKG.  If any concerns, will consult [facility]      [facility].  2. Hypertension.  Continue amlodipine.  Continue Norvasc.  3. Neck pain, tingling:  Continue Neurontin and Norco as per home      regimen.  4. Tobacco abuse.  The patient does not want to quit nor does he want      to even discuss it.   DISPOSITION:  Discharge if cardiac enzymes negative or unchanged EKG in  the morning and no further events.           ______________________________  Johney Maine, M.D.     JT/MEDQ  D:  05/28/2007  T:  05/28/2007  Job:  [id number]

## 2011-04-26 NOTE — Op Note (Signed)
[name], [name]              ACCOUNT NO.:  [id number]   MEDICAL RECORD NO.:  [id number]          PATIENT TYPE:  AMB   LOCATION:  DSC                          FACILITY:  [facility]   PHYSICIAN:  Lubertha Basque. Dalldorf, M.D.DATE OF BIRTH:  1956-10-13   DATE OF PROCEDURE:  11/04/2006  DATE OF DISCHARGE:                               OPERATIVE REPORT   PREOPERATIVE DIAGNOSIS:  Right foot chronic plantar fasciitis.   POSTOPERATIVE DIAGNOSIS:  Right foot chronic plantar fasciitis.   PROCEDURE:  Right foot endoscopic plantar fascial release.   ANESTHESIA:  General.   ATTENDING SURGEON:  Lubertha Basque. Jerl Santos, M.D.   ASSISTANTHarolyn Rutherford, PA   INDICATIONS FOR PROCEDURE:  The patient is a 55 year old male with a  long history of right heel pain.  This has persisted despite shoe  inserts and stretching as well as at least three plantar fascial  injections.  He has pain which limits his ability to walk and stand and  rest and he is offered an endoscopic plantar fascial release.  Informed  operative consent was obtained after discussion of possible  complications of reaction to anesthesia, infection, neurovascular  injury.   SUMMARY OF FINDINGS AND PROCEDURE:  Under general anesthesia through two  portals, a right plantar fascial endoscopic release was performed.  We  used the Topaz device by [employer] to perform our release.   DESCRIPTION OF PROCEDURE:  The patient was taken to the operating suite  where general anesthetic was applied without difficulty.  He was  positioned supine and prepped, draped normal sterile fashion.  After  administration of preop IV Kefzol, the right leg was elevated,  exsanguinated, tourniquet inflated about the calf.  We made a small  portal sharply through skin with blunt dissection down to the plantar  fascia on the medial aspect of the heel.  A trocar was then placed  across to the lateral aspect of the heel where a second stab wound was  made through skin  only with blunt dissection down to the trocar which  then was pushed through the lateral portal.  I then placed a scope and  visualized the plantar fascia.  I used the Topaz device by [employer]  and performed the perforation of the fascia in approximately 20  locations both above and below this cannula.  The portals were irrigated  followed by release of tourniquet.  The foot became pink and warm  immediately.  We then injected with some Depo-Medrol about the plantar  fascial origin.  We used simple sutures of nylon to loosely  reapproximate the portals followed by dry gauze and loose Ace wrap.  Estimated blood loss and intraoperative fluids as well as accurate  tourniquet time can obtained from anesthesia records.   DISPOSITION:  The patient was extubated in the operating room and taken  to recovery room in stable addition.  He was to go home same-day and  follow up in the office less than week.  I will contact him by phone  tonight.      Lubertha Basque Jerl Santos, M.D.  Electronically Signed     PGD/MEDQ  D:  11/04/2006  T:  11/04/2006  Job:  [id number]

## 2011-04-26 NOTE — Discharge Summary (Signed)
NAME:  HERON, PITCOCK                        ACCOUNT NO.:  [id number]   MEDICAL RECORD NO.:  [id number]                   PATIENT TYPE:  INP   LOCATION:  4734                                 FACILITY:  [facility]   PHYSICIAN:  Leighton Roach McDiarmid, M.D.             DATE OF BIRTH:  26-Dec-1955   DATE OF ADMISSION:  02/09/2003  DATE OF DISCHARGE:  02/10/2003                                 DISCHARGE SUMMARY   DISCHARGE DIAGNOSES:  1. Chest pain, rule out myocardial infarction.  2. Tobacco abuse.  3. Incomplete right bundle branch block.  4. Hypercholesterolemia.  5. Obesity.  6. History of peptic ulcer disease and gastroesophageal reflux disease.   DISCHARGE MEDICATIONS:  1. Protonix 80 mg p.o. daily x2 weeks.  2. Robaxin 750 mg one p.o. q.4h p.r.n. muscle spasm.  3. Lasix 40 mg one p.o. q.a.m. x4 days, then stop.   DISCHARGE INSTRUCTIONS:  The patient is to stop all NSAID use including  Advil, Motrin, and ibuprofen. He may take Tylenol as needed.  He is  instructed on no heavy lifting or vigorous activity until his Cardiolite  study is done. He is to stick to a low fat, low cholesterol diet.  For any  worsening of his chest pain associated with trouble breathing, sweating,  nausea, arm pain, or jaw pain that does not resolve with rest, he is to call  his doctor.   FOLLOW UP:  At [facility] [place] on March 17, at 10:40 with Dalbert Mayotte, M.D.  His Cardiolite stress test is set up with [facility] on March 12, at 1:15.  Phone number provided.   HISTORY OF PRESENT ILLNESS:  The patient is a 55 year old Caucasian male  that is obese and has a past medical history consistent with tobacco abuse,  dyspepsia, and high cholesterol, who presented with a one-day history of  intermittent, left-sided chest pain, lightheadedness, and dizziness.  It  started on the morning of admission while he was at work during his  sedentary job.  He described the chest pain as pressure  rated at 3 to 4 out  of 10 and lasting about 10 to 15 minutes with radiation to his left arm and  associated mild nausea. He had no associated chest pain, diaphoresis, or  similar symptoms previously.  He also reported three to four days of  bilateral lower extremity edema.   REVIEW OF SYSTEMS:  As per above, plus dyspnea on exertion and cervical disk  disease.   PHYSICAL EXAMINATION:  VITAL SIGNS:  Normal.  HEART:  Regular rate and rhythm. Normal PMI.  EXTREMITIES: No lower extremity edema.  LUNGS:  Clear to auscultation bilaterally with decreased breath sounds  throughout with prolonged expiratory phase and nonlabored breathing.  NEUROLOGY: Nonfocal.   LABORATORY DATA:  CBC; white count 8.4, hemoglobin 15.5, hematocrit 44.5,  platelet count 166.  His chemistry panel showed a sodium of 138,  potassium  4.0, chloride 105, bicarb 26, BUN 8, creatinine 0.7, glucose 96, calcium  9.1.   His chest x-ray on admission showed COPD with mild vascular congestion and a  slightly prominent heart.   EKG on admission showed normal sinus rhythm with an incomplete right bundle  branch block.   HOSPITAL COURSE:  Problem 1.  For his chest pain we wanted to rule out  myocardial infarction.  Three sets of cardiac enzymes were obtained x3 q.8h  and were negative x3.  EKG revealed no ischemic changes and the patient was  monitored on telemetry floor for 23-hour observation.  For his other risk  factors including tobacco abuse, we encouraged cessation. He had a  noticeably elevated blood pressure with no history of hypertension. This was  likely related to his fluid status as he has been taking multiple NSAIDs  daily.  Given his history of peptic ulcer disease and gastroesophageal  reflux disease, etiology of his chest pain, may likely be related to his  esophageal disease.  His Protonix was increased to 80 mg p.o. daily during  his hospital stay and he will be discharged home with this dose for two   weeks. In order to completely risk stratify, a Cardiolite stress test has  been set up for discharge at [facility] and will be performed  on March 12.   FOLLOW UP:  1. Compliance with stopping his NSAID use.  2. Evaluate his lower extremity edema.  He will receive Lasix for four days     following discharge home.  3. Blood pressure as his blood pressures have been elevated during his     hospital stay with no history of hypertension.  4. Follow up on results of the Cardiolite stress test.  5. Follow-up on chest pain relief with Protonix at high dose. He may warrant     a further GI workup.  6. Follow-up on low fat diet, cholesterol, and weight loss.     Lorne Skeens, D.O.                         Etta Grandchild, M.D.    Erick Alley  D:  02/10/2003  T:  02/11/2003  Job:  [id number]

## 2011-04-26 NOTE — Op Note (Signed)
NAME:  George Leon, George Leon                      ACCOUNT NO.:  [id number]   MEDICAL RECORD NO.:  [id number]                   PATIENT TYPE:  INP   LOCATION:  NA                                   FACILITY:  [facility]   PHYSICIAN:  Hewitt Shorts, M.D.            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   OPERATION PERFORMED:  C5-6 anterior cervical diskectomy and arthrodesis with  iliac crest allograft and tether cervical plating.   SURGEON:  Hewitt Shorts, M.D.   ASSISTANT:  Coletta Memos, M.D.   ANESTHESIA:  General endotracheal.   INDICATIONS FOR PROCEDURE:  The patient is a 55 year old man who presented  with myeloradiculopathy.  He had radicular symptoms.  He was found to have  some hyperreflexia on exam.  MRI scan revealed spondylitic disk herniation  at C5-6 with significant canal stenosis.  There was increased signal and  spinal cord x-ray showed mild retrolisthesis of C5 on 6 which was stable and  static through flexion and extension.  Decision was made to proceed with  diskectomy and arthrodesis.   DESCRIPTION OF PROCEDURE:  The patient was brought to the operating room and  placed under general endotracheal anesthesia.  The patient was placed in 10  pounds of halter traction, neck was prepped with Betadine soap and solution  and draped in sterile fashion.  A horizontal incision was made on the left  side of the neck.  The line of the incision was infiltrated with local  anesthetic with epinephrine.  Incision was made with Shaw scalpel at a  temperature of 120.  Dissection was carried down to the subcutaneous tissues  and  platysma.  Dissection was then carried out through an avascular plane  leaving the sternocleidomastoid muscle, carotid artery and jugular vein  laterally and the trachea and esophagus medially.  The ventral aspect of the  vertebral column was identified and localizing x-ray taken and the C5-6  intervertebral disk space identified.  Diskectomy was begun with incision of  the annulus and continued with microcurets and pituitary rongeurs.  The  cartilaginous end plates of the corresponding vertebrae were removed using  microcurets as well as the Micromax drill.  The operating microscope was  draped and brought into the field to provide additional magnification,  illumination and visualization and the remainder of the procedure was  performed using microdissection and microsurgical technique. Posterior  osteophytic overgrowth was removed using the Micromax drill as well as 2 mm  Kerrison punches with thin footplate.  Posterior longitudinal ligament was  removed and the spinal canal and thecal sac were decompressed.  Foramina  were examined as well to  ensure that nerve roots were decompressed within  the foramina and once the decompression was completed, we proceeded with the  arthrodesis.  A wedge of iliac crest allograft was selected.  It was cut and  shaped to size and positioned in the intervertebral disk space and  countersunk.  We then selected a 14 mm tether cervical plate and it was  secured to each of the vertebrae with a pair of  4.0 x 14 mm screws.  Each screw hole was drilled and tapped.  Screws placed  and x-ray was taken.  One screw required adjustment.  This was done.  Another x-ray was taken and we were pleased with the position of the plate  and screws as well as the graft.  The wound was irrigated with bacitracin  solution and checked for hemostasis which was established and confirmed and  then we proceeded with closure.  The platysma was closed with inverted  interrupted 2-0  undyed Vicryl sutures, the subcutaneous and subcuticular  layer were closed with inverted interrupted 3-0 undyed Vicryl sutures.  The  skin was approximated with Dermabond.  The patient tolerated the procedure  well.  The estimated blood loss was  25 cc.  Sponge, needle and instrument  counts were correct.  Following surgery the patient was taken out of  cervical traction once the bone graft was placed and prior to the plating,  was then reversed from the anesthetic to be extubated and was placed in a  soft cervical collar and transferred to the recovery room for further care.  Was found to be moving all four extremities to command.                                                 Hewitt Shorts, M.D.    RWN/MEDQ  D:  10/11/2002  T:  10/11/2002  Job:  [id number]

## 2011-04-26 NOTE — Op Note (Signed)
[place]  Patient:    George Leon, George Leon Visit Number: [id number] MRN: [id number]          Service Type: DSU Location: [facility] Attending Physician:  Marcene Corning Dictated by:   Lubertha Basque. Jerl Santos, M.D. Proc. Date: 03/02/02 Admit Date:  03/02/2002                             Operative Report  PREOPERATIVE DIAGNOSIS:  Right shoulder impingement.  POSTOPERATIVE DIAGNOSIS: 1. Right shoulder impingement. 2. Right shoulder bursitis and partial rotator cuff tear.  OPERATION PERFORMED: 1. Right shoulder arthroscopic acromioplasty. 2. Right shoulder arthroscopic debridement.  ANESTHESIA:  General.  ATTENDING SURGEON:  Lubertha Basque. Jerl Santos, M.D.  ASSISTANT:  Lindwood Qua, P.A.  INDICATIONS FOR PROCEDURE:  The patient is a  55 year old man with a long history of right shoulder pain.  This has persisted despite oral anti-inflammatories and injections which have afforded him transient relief. He was offered an arthroscopy at this point as he is having difficulty with overhead use and difficulty sleeping at night.  Planned procedure was for an arthroscopy.  The procedure was discussed with the patient and informed operative consent was obtained after discussion of possible complications of reaction to anesthesia and infection.  DESCRIPTION OF PROCEDURE:  The patient was taken to an operating suite where general anesthetic was applied after a block was given in the preanesthesia area.  He was then positioned in beach chair position and prepped and draped in normal sterile fashion.  After administration of preop intravenous antibiotics, an arthroscopy of the right shoulder was performed through a total of two portals.  The glenohumeral joint showed no degenerative change and the biceps tendon and labral structures were all intact.  The rotator cuff appeared benign from below.  In the subacromial space, he had a great deal of bursitis and some  partial thickness tearing of the rotator cuff which was minor.  A thorough bursectomy and debridement was done.  No full thickness tear was seen.  No partial thickness tear of consequence was seen.  He had a prominent subacromial spur addressed with an arthroscopic subacromial decompression.  This was done with the bur in the lateral position followed by transfer of the bur to posterior position.  The [facility] joint was nontender and no spurs were seen on x-ray or arthroscopy so this joint was not addressed.  The shoulder was thoroughly irrigated at the end of the case followed by placement of Marcaine with epinephrine and morphine.  Simple sutures of nylon were used to loosely reapproximate the portals followed by Adaptic and a dry gauze dressing with tape.  Estimated blood loss and intraoperative fluids can be obtained from Anesthesia records.  DISPOSITION:  The patient was extubated in the operating room and taken to the recovery room in stable condition.  Plans were for him to go home the same day and to follow up in the office in less than a week.  I will contact him by phone tonight. Dictated by:   Lubertha Basque Jerl Santos, M.D. Attending Physician:  Marcene Corning DD:  03/02/02 TD:  03/03/02 Job: [id number]

## 2011-04-26 NOTE — Discharge Summary (Signed)
NAMEANTONIS, LOR NO.:  [id number]   MEDICAL RECORD NO.:  [id number]          PATIENT TYPE:  INP   LOCATION:  3710                         FACILITY:  [facility]   PHYSICIAN:  Johney Maine, M.D.   DATE OF BIRTH:  1956-10-28   DATE OF ADMISSION:  05/27/2007  DATE OF DISCHARGE:  05/29/2007                               DISCHARGE SUMMARY   ATTENDING PHYSICIAN:  Leighton Roach McDiarmid, M.D., [facility].   ADMISSION DIAGNOSES:  1. Chest pain.  2. Hypertension.  3. Chronic pain.  4. Hyperlipidemia.  5. Tobacco abuse.   DISCHARGE DIAGNOSES:  1. Chest pain, gastrointestinal in nature.  2. Hypertension.  3. Chronic pain.  4. Hyperlipidemia.  5. Tobacco abuse.   DISCHARGE MEDICATIONS:  1. Amlodipine 10 mg p.o. daily.  2. Lisinopril 20 mg p.o. daily.  3. Neurontin 300 mg p.o. q.i.d. or as directed by Dr. Irving Burton.  4. Metoprolol 25 mg p.o. b.i.d.  5. Nexium 40 mg p.o. daily.  6. Norco 10/325 one tablet p.o. t.i.d.  7. Zocor 40 mg p.o. daily.   HOSPITAL COURSE:  This is a 55 year old male who presented to the  emergency room with a three-day course of chest pain that worsened on  the day of admission.  There was left-sided radiation and diaphoresis,  as well as dizziness.  Depending on when you ask him, the pain was and  was not relieved by nitroglycerin.  However, it is hard to determine as  it was given at the same time as the morphine.  Over the hospital  course, the patient's chest pain got better.  He had a cardiac cath  which was within normal limits.  Please see below for details.   1. Given the patient's risk factors, we did consider that this patient      had atypical chest pain.  We wanted to rule him out for myocardial      infarction.  His troponins remained negative throughout his      hospital course.  His EKG showed a right bundle branch block which      he had had on previous admissions.  There were no ST changes.      However,  given the patient's risk factors of hypertension and      extensive smoking history, we consulted cardiology.  Cardiology      felt also with these risk factors and his questionable history of a      myocardial infarction in the past, that he should undergo a cardiac      catheterization electively.  He underwent a cardiac catheterization      which showed patent coronary arteries with normal left ventricular      systolic function.  There was an elevated blood pressure.  However,      no treatment was needed during the cath.  Instead, for the chest      pain, we attributed it to GI in nature and continued the patient's      Nexium.  We did maximize the patient's blood pressure therapy and  cardiac protective therapy by adding on metoprolol to the patient's      medication list as well as lisinopril.  2. Hypertension.  The patient continued to have elevated pressures      during this hospital stay.  We we continued him on his home      amlodipine and lisinopril.  However, we did add metoprolol 25 mg      b.i.d. to help with cardiac protection.  The patient will be      followed by Dr. Irving Burton in the outpatient setting to help      determine any further blood pressure medication titration.  3. GERD:  The patient had GERD and this appeared to be a flareup.  We      continued him on his Nexium.  We can consider going up if this      persists.  4. Tobacco abuse:  The patient is not even in the pre-contemplation      phase of smoking cessation.  He does not want to quit smoking.  We      continued to discuss this throughout the hospital stay.  He smokes      two packs a day for 35 years and does not want to quit.  However,      after the cardiac catheterization and after numerous conversations      with the patient, he did agree to cut-back on his smoking.  During      his hospital stay, we continued a nicotine patch as the patient      wanted to smoke.  5. Hyperlipidemia.  The patient  came in without any medications as he      refused the offer of Niaspan in the outpatient setting to help his      HDL and triglycerides.  However, during his hospital stay, we      followed cardiology recommendations and started a statin as this      patient needs to be at an LDL of less than 70 ideally or at least      less than 100 which is he is not.  We started Zocor 40 mg p.o.      daily and the patient states he will continue to take this.  6. Chronic pain.  We continued the patient on his home dose of      Neurontin and Norco.   IMAGING:  Chest x-ray:  No acute pulmonary disease.   PERTINENT LABORATORY DATA:  Cardiac enzymes remain negative with a  troponin at its highest of 0.02 on admission.  He had an ESR of 3, a D-  dimer of less than 0.22.  Comprehensive metabolic panel:  Sodium 135,  potassium 3.8, chloride 105, CO2 21, glucose 90, BUN 4, creatinine 0.79.  On day of discharge, there were no further BMPs.  PT INR 12.2 and 0.9.  CBC on admission:  White blood cell count 13.7, hemoglobin 15.2,  hematocrit 44.6, platelets 239.  On day of discharge, the CBC was  unremarkable with no changes and a hemoglobin of 13.3, hematocrit 39.2,  white cells 9.6.  Lipid profile:  Total cholesterol 183, triglycerides  elevated at 241, HDL low at 23, LDL elevated at 112, BLGL 48.  No  further labs were drawn.   CONDITION ON DISCHARGE:  The patient was discharged in improved and  stable condition.  He was instructed to follow up for close follow up  with the [facility].  The patient was  given post-cath  instructions.           ______________________________  Johney Maine, M.D.     JT/MEDQ  D:  05/31/2007  T:  05/31/2007  Job:  [id number]   cc:   Arturo Morton. Riley Kill, MD, [facility]  Angeline Slim, M.D.

## 2011-05-02 ENCOUNTER — Ambulatory Visit: Payer: Self-pay | Admitting: Sports Medicine

## 2011-05-20 ENCOUNTER — Telehealth: Payer: Self-pay | Admitting: Sports Medicine

## 2011-05-20 NOTE — Telephone Encounter (Signed)
Needs for Dr T to call Cathy at [facility] about testosterone.  Cost too much out of pocket and cathy can tell him how to work around that.  Her number is [phone]

## 2011-05-20 NOTE — Telephone Encounter (Signed)
Pls just call her and ask her what form and location I need to send a script to.

## 2011-05-21 ENCOUNTER — Encounter: Payer: Self-pay | Admitting: Sports Medicine

## 2011-05-21 NOTE — Telephone Encounter (Signed)
Pt could go thru patient assistance thru the pharmaceutical to get meds for free. Testosterone is controlled substance. pcp can Write Rx for shot and get it filled at the pharmacy and he can bring it into office to be administered and take it back home with him after.Loralee Pacas [place]  Called pt and informed him that Dr. [name] would prefer that he have the topical testosterone not the injectable. He will need to go online and find out the process of how to get this medication for free. Pt stated that he will call Cathy at [facility] and find out the website and go from there.Marland KitchenMarland KitchenLoralee Pacas [place]

## 2011-05-21 NOTE — Telephone Encounter (Signed)
Called number for [facility] (cathy) and asked her to call back to obtain further information about RX.Marland KitchenLoralee Pacas [place]

## 2011-05-23 ENCOUNTER — Telehealth: Payer: Self-pay | Admitting: Sports Medicine

## 2011-05-23 MED ORDER — TESTOSTERONE 30 MG/ACT TD SOLN: 1.0000 "application " | Freq: Every day | TRANSDERMAL | Status: DC

## 2011-05-23 NOTE — Telephone Encounter (Signed)
Script at front for pickup. He will need to return in 2 months to have testosterone rechecked.

## 2011-05-23 NOTE — Telephone Encounter (Signed)
Pt is calling regarding testosterone med that was prescribed.  Says it cost 300.00 per month at that's too expensive for him.  Received a vocher for another med called Axiron topical cream.  Need a written rx for 30 day supply.  Call when ready.

## 2011-05-23 NOTE — Telephone Encounter (Signed)
Informed pt that Rx is up front for P/U.Marland KitchenLoralee Pacas [place]

## 2011-05-31 ENCOUNTER — Ambulatory Visit (INDEPENDENT_AMBULATORY_CARE_PROVIDER_SITE_OTHER): Payer: Self-pay | Admitting: Sports Medicine

## 2011-05-31 ENCOUNTER — Encounter: Payer: Self-pay | Admitting: Sports Medicine

## 2011-05-31 VITALS — BP 130/91 | HR 74 | Temp 98.0°F | Ht 69.0 in | Wt 236.0 lb

## 2011-05-31 DIAGNOSIS — L909 Atrophic disorder of skin, unspecified: Secondary | ICD-10-CM

## 2011-05-31 DIAGNOSIS — L918 Other hypertrophic disorders of the skin: Secondary | ICD-10-CM | POA: Insufficient documentation

## 2011-05-31 NOTE — Progress Notes (Signed)
  Subjective:    Patient ID: George Leon, male    DOB: [birth year], 55 y.o.   MRN: [id number]  HPI Bump on left ear, present 2 years, no change, no trauma.  Would like this removed.   Review of Systems    See HPI Objective:   Physical Exam  Constitutional: He appears well-developed and well-nourished.  Skin: Skin is warm and dry.       2 small skin tags on top of left ear.  Procedure: Removal of skin tags.  Risks, benefits, and alternatives explained and consent obtained. Time out conducted. Surface cleaned with alcohol/betadine/chlorhexidine. 0.5 cc lidocaine with epinephine infiltrated under skin tags Adequate anesthesia ensured. Forceps used to elevate skin tag and scalpel used to excise skin tag from skin surface. Hyfrecator used to achieve hemostasis and smooth edges of wound. Dressing applied. Pt stable. Pt advised to call or RTC for continued bleeding, spreading erythema/induration, fevers, or chills.     Assessment & Plan:

## 2011-05-31 NOTE — Assessment & Plan Note (Signed)
Removed and resolved.

## 2011-07-16 ENCOUNTER — Encounter: Payer: Self-pay | Admitting: Sports Medicine

## 2011-07-16 ENCOUNTER — Ambulatory Visit (INDEPENDENT_AMBULATORY_CARE_PROVIDER_SITE_OTHER): Payer: Self-pay | Admitting: Sports Medicine

## 2011-07-16 DIAGNOSIS — M159 Polyosteoarthritis, unspecified: Secondary | ICD-10-CM

## 2011-07-16 DIAGNOSIS — M6740 Ganglion, unspecified site: Secondary | ICD-10-CM

## 2011-07-16 NOTE — Assessment & Plan Note (Addendum)
This has recurred, however it is much smaller than before, and nearly imperceptible. Should this start to bother him he would need referral to hand surgery as he has already been aspirated and injected at the family practice center.

## 2011-07-16 NOTE — Progress Notes (Signed)
  Subjective:    Patient ID: George Leon, male    DOB: [birth year], 55 y.o.   MRN: [id number]  HPI Bilateral knee osteoarthritis: The patient comes in for repeat corticosteroid injections, the last of which were approximately 3 months ago.  He typically gets about 3 months of benefit. He is asking for a refill on his narcotic pain reliever, however he was made aware that his primary care provider at the family practice center should do this.  Left index finger laceration: Accidentally cut his left index finger on the dorsum one day ago. Has no loss of function, no bleeding, no drainage.  Past medical history, family family history, social history, surgical history, allergies are unchanged from prior family practice notes. Review of Systems    negative except as noted above in the history of present illness Objective:   Physical Exam General: Well-developed, well-nourished Caucasian male in no acute distress. Musculoskeletal: Index finger: There is a 1 cm laceration over the dorsum of the index finger on the left side. He has full strength to extension and flexion at the metacarpophalangeal, proximal interphalangeal, and distal interphalangeal joints. A Band-Aid was placed over the healing laceration.  Knees: Both are unremarkable to inspection, range of motion is full and there is no effusion.  Consent obtained and verified. Time-out conducted. Noted no overlying erythema, induration, or other signs of local infection. Sterile betadine prep. Furthur cleansed with alcohol. Topical analgesic spray: Ethyl chloride. Joint: Bilateral tibiofemoral joint. Approached in typical fashion with: Medial approach Completed without difficulty Meds: 40 mg of Depo-Medrol, 4 cc lidocaine 1% into each knee. Advised to call if fevers/chills, erythema, induration, drainage, or persistent bleeding.     Assessment & Plan:

## 2011-07-16 NOTE — Assessment & Plan Note (Signed)
Injected as above. He may return in 3 months for repeat injections if needed. I would like him to wear bilateral knee sleeves particularly while at work for support, we currently do not have any here at the sports medicine center and so he can go to the family practice center to pick these up. Chronic narcotics must be refilled by his primary care provider.

## 2011-08-06 ENCOUNTER — Ambulatory Visit (INDEPENDENT_AMBULATORY_CARE_PROVIDER_SITE_OTHER): Payer: Self-pay | Admitting: Family Medicine

## 2011-08-06 ENCOUNTER — Encounter: Payer: Self-pay | Admitting: Family Medicine

## 2011-08-06 VITALS — BP 136/94 | HR 79 | Temp 97.7°F | Ht 68.5 in | Wt 232.0 lb

## 2011-08-06 DIAGNOSIS — I10 Essential (primary) hypertension: Secondary | ICD-10-CM

## 2011-08-06 DIAGNOSIS — E7800 Pure hypercholesterolemia, unspecified: Secondary | ICD-10-CM

## 2011-08-06 DIAGNOSIS — G8929 Other chronic pain: Secondary | ICD-10-CM

## 2011-08-06 DIAGNOSIS — R252 Cramp and spasm: Secondary | ICD-10-CM

## 2011-08-06 MED ORDER — ATORVASTATIN CALCIUM 10 MG PO TABS: 10.0000 mg | ORAL_TABLET | Freq: Every day | ORAL | Status: DC

## 2011-08-06 MED ORDER — HYDROCODONE-ACETAMINOPHEN 5-325 MG PO TABS: ORAL_TABLET | ORAL | Status: DC

## 2011-08-06 MED ORDER — METOPROLOL SUCCINATE ER 200 MG PO TB24: 100.0000 mg | ORAL_TABLET | Freq: Every day | ORAL | Status: DC

## 2011-08-06 MED ORDER — QUINAPRIL HCL 20 MG PO TABS: 20.0000 mg | ORAL_TABLET | Freq: Every day | ORAL | Status: DC

## 2011-08-06 NOTE — Patient Instructions (Signed)
It was good to meet you today. I want you to try stretching your legs at night. I am providing you with refills on medications.  Any additional refills on narcotics will require office visits per clinic policy. I am getting some electrolyte labs on you today to see if there is anything you need to supplement in your diet.

## 2011-08-07 LAB — BASIC METABOLIC PANEL
BUN: 6 mg/dL (ref 6–23)
Chloride: 105 mEq/L (ref 96–112)
Glucose, Bld: 89 mg/dL (ref 70–99)
Potassium: 4.1 mEq/L (ref 3.5–5.3)
Sodium: 137 mEq/L (ref 135–145)

## 2011-08-07 LAB — MAGNESIUM: Magnesium: 1.9 mg/dL (ref 1.5–2.5)

## 2011-08-07 LAB — PHOSPHORUS: Phosphorus: 3.8 mg/dL (ref 2.3–4.6)

## 2011-08-08 ENCOUNTER — Encounter: Payer: Self-pay | Admitting: Family Medicine

## 2011-08-15 NOTE — Assessment & Plan Note (Signed)
Pt has been maintained on chronic narcotics for some time now and they seem to significantly help his function.  Will continue to provide refills on these.

## 2011-08-16 NOTE — Assessment & Plan Note (Signed)
Will continue pt's current management.  Has slightly high diastolic but will not add any new medications at this time.

## 2011-08-16 NOTE — Assessment & Plan Note (Signed)
Changed to lipitor 10 due to pharmacy formulary.  Will continue this.  Pt due for FLP at the time of his next visit.

## 2011-08-16 NOTE — Progress Notes (Signed)
Subjective: Pt presents today to meet new doctor and to obtain medication refills.  He reports that he has one  New problem.  He has been having nocternal foot cramps 4-5 times per week.  Cramps are on the lateral aspect of his foot and extend somewhat to the bottom.  They only occur at night.  They are bilateral.  No aggrevating/alleviating factors.  Does not sleep with a tight sheet over his feet.  No recent trauma.  Pt also has problems with right arm/hand pain that radiates from his neck.  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  This was to prevent loss of use of his hand.  However, he has continued to have pain for years.  He has tried morphine, percocet, lortab, and methadone, none of which have worked terribly well.  He says that high doses of neurontin did work for him but made him too sleepy.  He has also tried elivil an dflexeral, both of which made hime too sleepy.  Medications reviewed and updated.  Objective:  Filed Vitals:   08/06/11 1015  BP: 136/94  Pulse: 79  Temp: 97.7 F (36.5 C)   Gen: NAD CV: RRR Resp: Resp Abd: SNTND Ext: No swelling/erythema.  Slightly decreased strength on the right.  Decreased sensation in the right arm.  Assessment/Plan: Will obtain BMET/Mg/Phos to eval for electrolyte abnormalities that might account for cramps.  Otherwise, recommend stretching and exercise.  No evidence of concerning etiology for cramps. Please also see individual problems in problem list for problem-specific plans.

## 2011-09-19 ENCOUNTER — Ambulatory Visit (INDEPENDENT_AMBULATORY_CARE_PROVIDER_SITE_OTHER): Payer: Self-pay | Admitting: Family Medicine

## 2011-09-19 ENCOUNTER — Encounter: Payer: Self-pay | Admitting: Family Medicine

## 2011-09-19 VITALS — BP 164/85 | HR 69 | Temp 98.3°F | Ht 68.0 in | Wt 237.0 lb

## 2011-09-19 DIAGNOSIS — K044 Acute apical periodontitis of pulpal origin: Secondary | ICD-10-CM

## 2011-09-19 DIAGNOSIS — R6882 Decreased libido: Secondary | ICD-10-CM

## 2011-09-19 DIAGNOSIS — E291 Testicular hypofunction: Secondary | ICD-10-CM

## 2011-09-19 DIAGNOSIS — R5383 Other fatigue: Secondary | ICD-10-CM

## 2011-09-19 DIAGNOSIS — K047 Periapical abscess without sinus: Secondary | ICD-10-CM | POA: Insufficient documentation

## 2011-09-19 DIAGNOSIS — R5381 Other malaise: Secondary | ICD-10-CM

## 2011-09-19 DIAGNOSIS — Z23 Encounter for immunization: Secondary | ICD-10-CM

## 2011-09-19 MED ORDER — OXYCODONE-ACETAMINOPHEN 5-325 MG PO TABS: 1.0000 | ORAL_TABLET | Freq: Four times a day (QID) | ORAL | Status: DC | PRN

## 2011-09-19 MED ORDER — AMOXICILLIN 500 MG PO CAPS: 500.0000 mg | ORAL_CAPSULE | Freq: Two times a day (BID) | ORAL | Status: DC

## 2011-09-19 NOTE — Assessment & Plan Note (Signed)
I am not entirely clear per the patient's diagnosis of low testosterone comes from. The only labs I can find are approximately 6 months old and at that time the patient's testosterone level was normal. We will recheck the patient's testosterone level today and have him return to clinic to discuss the results at a later point.

## 2011-09-19 NOTE — Patient Instructions (Signed)
It was good to see you today. I have sent in a antibiotic for you to take for the next 10 days. I'm also giving her a small supply of Percocet to take for the pain for the next several days. We need to check your testosterone level before we do any serious discussions about how much to give you. Make an appointment to get her blood drawn in the next several days and then come back to see me in about a week so we can discuss it further.

## 2011-09-19 NOTE — Progress Notes (Signed)
Subjective: The patient presents today with several complaints.  1: The patient reports that approximately 3 days ago he injured one of his bottom teeth. This has been a chronic issue for him, as he has very poor dentition. Since that time he has been having problems with significantly increased pain in his jaw, some facial swelling, and some low-grade fevers. He has had problems with this in the past and has required antibiotics. He has not seen a dentist in some time.  2: The patient does have significant problems sleeping due to the pain caused by #1. He has been taking his Norco, but it does not appear to be significantly helping his pain, and this is causing significant problems for his sleep.  3: The patient had previously been placed on topical testosterone but was unable to afford this. He is interested in testosterone injections as these will be covered by an orange card. He reports that he generally feels wiped out and does have some sexual dysfunction that he feels would be helped by these  Objective:  Filed Vitals:   09/19/11 1551  BP: 164/85  Pulse: 69  Temp: 98.3 F (36.8 C)   Gen: No acute distress HEENT: The patient is missing most T., and his front teeth are in the process of DKA. There is no visible purulence, however there is some soft tissue swelling on the right side of the patient's face along with significant tenderness along the mandible and in the submandibular area.   Assessment/Plan: I am not entirely clear per the patient's diagnosis of low testosterone comes from. The only labs I can find are approximately 6 months old and at that time the patient's testosterone level was normal. We will recheck the patient's testosterone level today and have him return to clinic to discuss the results at a later point.  I am concerned that the patient may have a dental infection. I encouraged him strongly to go to a dentist. I will provide him with a prescription for amoxicillin,  and I will also give him a short course of Percocet as this has helped significantly in the past. I am somewhat concerned having reviewed some of his old records that he may begin to exhibit narcotic seeking behavior. While I have agreed to continue providing him with Norco for his chronic pain related to his neck injury, I feel that great caution is needed in any other narcotic prescriptions.  Please also see individual problems in problem list for problem-specific plans.

## 2011-09-19 NOTE — Assessment & Plan Note (Signed)
I am concerned that the patient may have a dental infection. I encouraged him strongly to go to a dentist. I will provide him with a prescription for amoxicillin, and I will also give him a short course of Percocet as this has helped significantly in the past. I am somewhat concerned having reviewed some of his old records that he may begin to exhibit narcotic seeking behavior. While I have agreed to continue providing him with Norco for his chronic pain related to his neck injury, I feel that great caution is needed in any other narcotic prescriptions.

## 2011-09-24 ENCOUNTER — Other Ambulatory Visit: Payer: Self-pay

## 2011-09-24 ENCOUNTER — Ambulatory Visit (INDEPENDENT_AMBULATORY_CARE_PROVIDER_SITE_OTHER): Payer: Self-pay | Admitting: Sports Medicine

## 2011-09-24 ENCOUNTER — Encounter: Payer: Self-pay | Admitting: Sports Medicine

## 2011-09-24 DIAGNOSIS — M179 Osteoarthritis of knee, unspecified: Secondary | ICD-10-CM

## 2011-09-24 DIAGNOSIS — R5383 Other fatigue: Secondary | ICD-10-CM

## 2011-09-24 DIAGNOSIS — M722 Plantar fascial fibromatosis: Secondary | ICD-10-CM

## 2011-09-24 DIAGNOSIS — IMO0002 Reserved for concepts with insufficient information to code with codable children: Secondary | ICD-10-CM

## 2011-09-24 DIAGNOSIS — R6882 Decreased libido: Secondary | ICD-10-CM

## 2011-09-24 DIAGNOSIS — I10 Essential (primary) hypertension: Secondary | ICD-10-CM

## 2011-09-24 DIAGNOSIS — M1710 Unilateral primary osteoarthritis, unspecified knee: Secondary | ICD-10-CM

## 2011-09-24 NOTE — Patient Instructions (Signed)
I am sorry you are hurting today.  Your last knee injection was in August, we can only do them every three months.  Please come back in one month for injections.  You also likely have plantar fascitis in your right foot.  Try wearing these green sports insoles to try to relieve the pressure.

## 2011-09-24 NOTE — Progress Notes (Signed)
  Subjective:    Patient ID: George Leon, male    DOB: 11/03/1956, 55 y.o.   MRN: [id number]  HPI The patient returns with bilateral knee pain. He was injected approximately 2 months ago, and is starting to have some pain come back. He didn't realize it was too early to have another set of injections.  He also is complaining of some pain in his right heel. He notes this after walking from the bus stop to the sports medicine center. This is not necessarily worse in the morning. It does not radiate. Is not associated with swelling. He does complain of some numbness in his toes after walking long distances. He's had an injection for this in the past, he's also had heel cups, neither of which have provided him much benefit.   Review of Systems    no fevers, chills, night sweats, weight loss. Objective:   Physical Exam General: Well-developed, overweight Caucasian male in no acute distress. Skin: Warm and dry. Respiratory: Not using accessory muscles. Neuro: Alert and oriented x3, extraocular muscles intact. Musculoskeletal: Both knees are unremarkable to inspection, no effusion, full range of motion from 0-135, strength 5 out of 5, no joint line tenderness. Right heel tender to palpation the calcaneal insertion of the plantar fascia.  MSK ultrasound: Right plantar fascia 0.53 cm. Left plantar fascia is 0.39 cm. Images saved.     Assessment & Plan:  1. Osteoarthritis of both knees: He will come back in a period of one month, at that point we can inject his knees again. We did discuss Visco supplementation, however he has no insurance and no job and is unable to afford this currently.  2. Plantar fasciitis: I placed sports insoles into his shoes. The sports insoles do have a doughnut under the plantar fascia that she take some pressure off of it. I will see him back in one month to discuss this as well. Should his symptoms be no better we can consider an injection, or heel lifts.  3.  Hypertension: Advised to followup with his primary care physician regarding his elevated blood pressure.

## 2011-09-24 NOTE — Progress Notes (Signed)
LABS DONE TODAY Amber Williard 

## 2011-09-25 LAB — CK TOTAL AND CKMB (NOT AT ARMC)
CK, MB: 2.1
CK, MB: 2.3
Relative Index: 1.8
Total CK: 115
Total CK: 184

## 2011-09-25 LAB — POCT CARDIAC MARKERS
CKMB, poc: <1 — ABNORMAL LOW
Myoglobin, poc: 49.3
Myoglobin, poc: 60.9
Operator id: 285841
Troponin i, poc: <0.05

## 2011-09-25 LAB — CBC
HCT: 39.2
MCHC: 33.8
MCHC: 34.1
MCV: 86.6
MCV: 87
Platelets: 218
Platelets: 239
RBC: 5.13
WBC: 9.6

## 2011-09-25 LAB — PROTIME-INR: INR: 0.9

## 2011-09-25 LAB — DIFFERENTIAL
Eosinophils Absolute: 0.2
Eosinophils Relative: 2
Lymphocytes Relative: 28
Lymphs Abs: 3.9 — ABNORMAL HIGH
Monocytes Absolute: 0.7
Monocytes Relative: 5

## 2011-09-25 LAB — APTT: aPTT: 25

## 2011-09-25 LAB — TESTOSTERONE: Testosterone: 224.24 ng/dL — ABNORMAL LOW (ref 250–890)

## 2011-09-25 LAB — TROPONIN I: Troponin I: 0.02

## 2011-09-25 LAB — COMPREHENSIVE METABOLIC PANEL
ALT: 20
AST: 19
Albumin: 3.9
CO2: 21
Calcium: 8.9
Creatinine, Ser: 0.79
GFR calc Af Amer: >60
GFR calc non Af Amer: >60
Sodium: 135
Total Protein: 6.9

## 2011-09-25 LAB — LIPID PANEL: HDL: 23 — ABNORMAL LOW

## 2011-09-25 LAB — D-DIMER, QUANTITATIVE: D-Dimer, Quant: <0.22

## 2011-09-25 LAB — TESTOSTERONE, % FREE: Testosterone-% Free: 1.5 % — ABNORMAL LOW (ref 1.6–2.9)

## 2011-09-25 LAB — TESTOSTERONE, FREE: Testosterone, Free: 34.2 pg/mL — ABNORMAL LOW (ref 47.0–244.0)

## 2011-10-02 ENCOUNTER — Ambulatory Visit (INDEPENDENT_AMBULATORY_CARE_PROVIDER_SITE_OTHER): Payer: Self-pay | Admitting: Family Medicine

## 2011-10-02 ENCOUNTER — Encounter: Payer: Self-pay | Admitting: Family Medicine

## 2011-10-02 DIAGNOSIS — R7989 Other specified abnormal findings of blood chemistry: Secondary | ICD-10-CM

## 2011-10-02 DIAGNOSIS — E291 Testicular hypofunction: Secondary | ICD-10-CM

## 2011-10-04 MED ORDER — TESTOSTERONE CYPIONATE 100 MG/ML IM SOLN: 100.0000 mg | INTRAMUSCULAR | Status: AC

## 2011-10-06 DIAGNOSIS — R7989 Other specified abnormal findings of blood chemistry: Secondary | ICD-10-CM | POA: Insufficient documentation

## 2011-10-06 NOTE — Assessment & Plan Note (Signed)
I will call in the patient's testosterone injections. We will plan to administer them at home, although I have talked with him about administering the first one in clinic in a nurse visit so we can make certain that he is doing it correctly. We will start at 100 mg injected every 14 days and recheck his testosterone level in about five weeks.

## 2011-10-06 NOTE — Progress Notes (Signed)
Subjective: The patient presents today to follow-up his lab results. He continues to report decreased libido, poor energy, and easy fatigue. The patient's lab results were reviewed, and he is noted to have a mildly decreased testosterone level. The patient has previously had this diagnosis, and was tried on topical androgens. Unfortunately, this was too expensive for the patient. Accordingly, he is interested in testosterone injections.  Objective:  Filed Vitals:   10/02/11 1527  BP: 160/92  Pulse: 90  Temp: 98 F (36.7 C)   Gen: No acute distress CV: Regular rate and rhythm Resp: Cleared auscultation bilaterally Extremities: no edema, 2+ pulses  Assessment/Plan: I will call in the patient's testosterone injections. We will plan to administer them at home, although I have talked with him about administering the first one in clinic in a nurse visit so we can make certain that he is doing it correctly. We will start at 100 mg injected every 14 days and recheck his testosterone level in about five weeks.  Please also see individual problems in problem list for problem-specific plans.

## 2011-10-07 ENCOUNTER — Ambulatory Visit: Payer: Self-pay | Admitting: Family Medicine

## 2011-10-10 ENCOUNTER — Encounter (HOSPITAL_BASED_OUTPATIENT_CLINIC_OR_DEPARTMENT_OTHER): Payer: Self-pay | Admitting: *Deleted

## 2011-10-10 ENCOUNTER — Ambulatory Visit: Payer: Self-pay

## 2011-10-10 ENCOUNTER — Emergency Department (HOSPITAL_BASED_OUTPATIENT_CLINIC_OR_DEPARTMENT_OTHER)
Admission: EM | Admit: 2011-10-10 | Discharge: 2011-10-10 | Disposition: A | Discharge status: Home / Self Care | Payer: Self-pay | Attending: Emergency Medicine | Admitting: Emergency Medicine

## 2011-10-10 DIAGNOSIS — H109 Unspecified conjunctivitis: Secondary | ICD-10-CM | POA: Insufficient documentation

## 2011-10-10 DIAGNOSIS — H00019 Hordeolum externum unspecified eye, unspecified eyelid: Secondary | ICD-10-CM | POA: Insufficient documentation

## 2011-10-10 DIAGNOSIS — I252 Old myocardial infarction: Secondary | ICD-10-CM | POA: Insufficient documentation

## 2011-10-10 DIAGNOSIS — I10 Essential (primary) hypertension: Secondary | ICD-10-CM | POA: Insufficient documentation

## 2011-10-10 DIAGNOSIS — G8929 Other chronic pain: Secondary | ICD-10-CM | POA: Insufficient documentation

## 2011-10-10 HISTORY — DX: Essential (primary) hypertension: I10

## 2011-10-10 HISTORY — DX: Acute myocardial infarction, unspecified: I21.9

## 2011-10-10 MED ORDER — ERYTHROMYCIN 5 MG/GM OP OINT
TOPICAL_OINTMENT | Freq: Four times a day (QID) | OPHTHALMIC | Status: DC
  Administered 2011-10-10: 21:00:00 via OPHTHALMIC

## 2011-10-10 MED ORDER — ERYTHROMYCIN 5 MG/GM OP OINT
TOPICAL_OINTMENT | OPHTHALMIC | Status: AC
  Filled 2011-10-10: qty 3.5

## 2011-10-10 NOTE — ED Provider Notes (Signed)
History     CSN: [id number] Arrival date & time: 10/10/2011  8:18 PM   First MD Initiated Contact with Patient 10/10/11 2020      Chief Complaint  Patient presents with  . Eye Problem    (Consider location/radiation/quality/duration/timing/severity/associated sxs/prior treatment) HPI Comments: Pt states that he has swelling to both lids last week and with compresses the left swelling and redness went away:pt states that the right has not gone away and now he is having redness and drainage to the left eye  Patient is a 55 y.o. male presenting with eye problem. The history is provided by the patient. No language interpreter was used.  Eye Problem  This is a new problem. The current episode started more than 2 days ago. The problem occurs constantly. The problem has not changed since onset.There is pain in the right eye. There was no injury mechanism. The patient is experiencing no pain. There is no history of trauma to the eye. There is no known exposure to pink eye. He does not wear contacts. Associated symptoms include eye redness and itching. Pertinent negatives include no foreign body sensation. Treatments tried: warm soaks. The treatment provided mild relief.    Past Medical History  Diagnosis Date  . Hypertension   . Acute MI   . Chronic pain     Past Surgical History  Procedure Date  . Joint replacement   . Back surgery     History reviewed. No pertinent family history.  History  Substance Use Topics  . Smoking status: Current Everyday Smoker -- 1.0 packs/day  . Smokeless tobacco: Never Used  . Alcohol Use: No      Review of Systems  Constitutional: Negative.   Eyes: Positive for redness.  Respiratory: Negative.   Cardiovascular: Negative.   Skin: Positive for itching.  Neurological: Negative.     Allergies  Furosemide; Hydrochlorothiazide; Sulfamethoxazole; and Adhesive  Home Medications   Current Outpatient Rx  Name Route Sig Dispense Refill  .  ALBUTEROL SULFATE HFA 108 (90 BASE) MCG/ACT IN AERS Inhalation Inhale 1-2 puffs into the lungs every 4 (four) hours as needed. As needed for SOB/wheeze/cough    . ATORVASTATIN CALCIUM 10 MG PO TABS Oral Take 1 tablet (10 mg total) by mouth daily. 30 tablet 3  . ESOMEPRAZOLE MAGNESIUM 40 MG PO CPDR Oral Take 40 mg by mouth daily.      Marland Kitchen HYDROCODONE-ACETAMINOPHEN 5-325 MG PO TABS Oral Take 2 tablets by mouth 4 (four) times daily as needed. For pain     . METOPROLOL SUCCINATE 200 MG PO TB24 Oral Take 200 mg by mouth daily.      . QUINAPRIL HCL 20 MG PO TABS Oral Take 1 tablet (20 mg total) by mouth at bedtime. 30 tablet 3  . TESTOSTERONE CYPIONATE 100 MG/ML IM OIL Intramuscular Inject 1 mL (100 mg total) into the muscle every 14 (fourteen) days. 2 mL 3    BP 149/97  Pulse 87  Temp(Src) 98.7 F (37.1 C) (Oral)  Resp 16  Ht 5\' 7"  (1.702 m)  Wt 230 lb (104.327 kg)  BMI 36.02 kg/m2  SpO2 99%  Physical Exam  Nursing note and vitals reviewed. Constitutional: He is oriented to person, place, and time. He appears well-developed and well-nourished.  HENT:  Head: Normocephalic and atraumatic.  Eyes: Pupils are equal, round, and reactive to light. Right eye exhibits discharge and hordeolum. Right conjunctiva is injected.  Cardiovascular: Normal rate and regular rhythm.   Pulmonary/Chest: Effort normal  and breath sounds normal.  Neurological: He is alert and oriented to person, place, and time.    ED Course  Procedures (including critical care time)  Labs Reviewed - No data to display No results found.   1. Stye   2. Conjunctivitis       MDM  Pt given ointment here to treat conjunctivitis   Osvaldo Human, M.D.      Teressa Lower, NP 10/10/11 2112  Carleene Cooper III, MD 10/11/11 [id number]

## 2011-10-10 NOTE — ED Notes (Signed)
Pt c/o right eye lid swelling and redness

## 2011-10-15 ENCOUNTER — Encounter: Payer: Self-pay | Admitting: Sports Medicine

## 2011-10-15 ENCOUNTER — Ambulatory Visit (INDEPENDENT_AMBULATORY_CARE_PROVIDER_SITE_OTHER): Payer: Self-pay | Admitting: Sports Medicine

## 2011-10-15 DIAGNOSIS — S335XXA Sprain of ligaments of lumbar spine, initial encounter: Secondary | ICD-10-CM

## 2011-10-15 DIAGNOSIS — M159 Polyosteoarthritis, unspecified: Secondary | ICD-10-CM

## 2011-10-15 MED ORDER — AMITRIPTYLINE HCL 25 MG PO TABS: 25.0000 mg | ORAL_TABLET | Freq: Every day | ORAL | Status: DC

## 2011-10-15 NOTE — Progress Notes (Addendum)
  Subjective:    Patient ID: George Leon, male    DOB: 02/23/1956, 55 y.o.   MRN: [id number]  HPI  Bilateral knee osteoarthritis: The patient comes in for followup of his bilateral knee degenerative joint disease, we've been maintaining him on bilateral knee corticosteroid injections, the last of which were approximately 3 months ago on July 16, 2011.  He typically gets about 3 months of benefit.   He had some heel pain at the last visit consistent with plantar fasciitis, I placed some sports insoles at that point.  Past medical history, family family history, social history, surgical history, allergies are unchanged from prior family practice notes. Review of Systems    negative except as noted above in the history of present illness Objective:   Physical Exam  General: Well-developed, well-nourished Caucasian male in no acute distress. Musculoskeletal: Knees: Both are unremarkable to inspection, range of motion is full and there is no effusion.  Consent obtained and verified. Time-out conducted. Noted no overlying erythema, induration, or other signs of local infection. Sterile betadine prep. Furthur cleansed with alcohol. Topical analgesic spray: Ethyl chloride. Joint: Bilateral tibiofemoral joint. Approached in typical fashion with: Medial approach Completed without difficulty Meds: 40 mg of Depo-Medrol, 4 cc lidocaine 1% into each knee. Advised to call if fevers/chills, erythema, induration, drainage, or persistent bleeding.     Assessment & Plan:  1. bilateral knee osteoarthritis: Injected as above, he may continue his home modalities. We can see him back in a period of 3 months to reinject. If the benefit of the injections wanes, and we can send him to [facility] orthopedics for Visco supplementation.  2. Lumbar spasm: The patient is having a flare of his lumbar spasm, he does have Flexeril but this makes him too drowsy, we'll go ahead and try some amitriptyline 25 at  bedtime and see him back in 3 months to reassess his knee osteoarthritis.

## 2011-10-21 ENCOUNTER — Other Ambulatory Visit: Payer: Self-pay | Admitting: Family Medicine

## 2011-10-21 NOTE — Telephone Encounter (Signed)
Refill request

## 2011-10-24 ENCOUNTER — Encounter: Payer: Self-pay | Admitting: Family Medicine

## 2011-10-24 ENCOUNTER — Ambulatory Visit (INDEPENDENT_AMBULATORY_CARE_PROVIDER_SITE_OTHER): Payer: Self-pay | Admitting: Family Medicine

## 2011-10-24 DIAGNOSIS — G8929 Other chronic pain: Secondary | ICD-10-CM

## 2011-10-24 DIAGNOSIS — E291 Testicular hypofunction: Secondary | ICD-10-CM

## 2011-10-24 MED ORDER — HYDROCODONE-ACETAMINOPHEN 5-325 MG PO TABS: 2.0000 | ORAL_TABLET | Freq: Four times a day (QID) | ORAL | Status: DC | PRN

## 2011-10-24 NOTE — Progress Notes (Signed)
Subjective: The patient presents today for a monthly appointment for pain medication refills. He is one week early because he is leaving town in the next several days for a job in [place] and will be gone for the next 2 weeks. He is very happy about the job as he has been unemployed for 2 years. He is not reporting any increase in his pain symptoms, and is able to manage his activities of life on his current pain medications.  The patient reports that he began his testosterone injections 2 week ago. He thinks he may be noticing a bit of a difference in his energy level.  Objective:  Filed Vitals:   10/24/11 1341  BP: 157/94  Pulse: 65  Temp: 98.4 F (36.9 C)   Gen: NAD CV: RRR Resp: CTABL, mildly decreased Abd: SNTND Ext: No edema, 2+ pulses  Assessment/Plan:  Please also see individual problems in problem list for problem-specific plans.

## 2011-10-24 NOTE — Patient Instructions (Signed)
It was great to see you today! I am giving you a refill on your Norco prescription.  I hope your job goes well! Come back in about 4 weeks for [place] to recheck your testosterone level.

## 2011-10-24 NOTE — Assessment & Plan Note (Signed)
Will plan to recheck levels in 6 weeks.

## 2011-10-24 NOTE — Assessment & Plan Note (Signed)
I will give the patient his prescription one week early this time. If this becomes repeated pattern, we will have to have a further discussion.

## 2011-11-07 ENCOUNTER — Telehealth: Payer: Self-pay | Admitting: Family Medicine

## 2011-11-07 NOTE — Telephone Encounter (Signed)
George Leon,  I spoke with this patient and when I asked him about the appointment he mentioned he said that he did not have one at all and that he was going to "maybe" look this week into going to one. He says that something has to be done about this pain in his mouth, but he was holding off due to you giving him medication for this. He was on amoxicillin for this and it looks like the last time it was filled on 10/11. He is taking hydrocodone right now and that is not helping this. He says you usually give him amoxicillin and Percocet, not Vicodin, and that if he is unable to get the Percocet he will "double up" on vicodin. -----Huntley Dec

## 2011-11-07 NOTE — Telephone Encounter (Signed)
Needs an appointment for any of the above per clinic policy.

## 2011-11-07 NOTE — Telephone Encounter (Signed)
Pt says he is working on seeing an Transport planner next week but currently has an infection in his teeth and says Dr. Louanne Belton has given him abx in the past for it, is asking if MD will consider it again?

## 2011-11-08 ENCOUNTER — Encounter: Payer: Self-pay | Admitting: Family Medicine

## 2011-11-08 ENCOUNTER — Ambulatory Visit (INDEPENDENT_AMBULATORY_CARE_PROVIDER_SITE_OTHER): Payer: Self-pay | Admitting: Family Medicine

## 2011-11-08 DIAGNOSIS — K044 Acute apical periodontitis of pulpal origin: Secondary | ICD-10-CM

## 2011-11-08 DIAGNOSIS — K047 Periapical abscess without sinus: Secondary | ICD-10-CM

## 2011-11-08 MED ORDER — OXYCODONE-ACETAMINOPHEN 5-325 MG PO TABS: 1.0000 | ORAL_TABLET | Freq: Three times a day (TID) | ORAL | Status: AC | PRN

## 2011-11-08 MED ORDER — OXYCODONE-ACETAMINOPHEN 5-300 MG PO TABS: 1.0000 | ORAL_TABLET | Freq: Three times a day (TID) | ORAL | Status: DC | PRN

## 2011-11-08 MED ORDER — AMOXICILLIN 500 MG PO CAPS: 500.0000 mg | ORAL_CAPSULE | Freq: Two times a day (BID) | ORAL | Status: AC

## 2011-11-08 MED ORDER — CLINDAMYCIN HCL 300 MG PO CAPS: 300.0000 mg | ORAL_CAPSULE | Freq: Three times a day (TID) | ORAL | Status: AC

## 2011-11-08 NOTE — Telephone Encounter (Signed)
Made an appointment wit dr. Gwendolyn Grant today @ 4pm because patient could not wait until 12/4 to see Dr. Louanne Belton

## 2011-11-10 NOTE — Assessment & Plan Note (Signed)
Concerned for increasing swelling, questionable systemic symptoms (chills), and lymph node enlargement.   Broadening coverage to clindamycin for anaerobe coverage. I read Dr. Radonna Ricker note regarding possible narcotic seeking behavior.  With evidence of acute worsening of infection and increasing pain, provided patient with short-term course of Percocet. Discussed there is no reason to refill this medication after he has been definitely treated by oral surgeon; patient agreeable to this. Had patient NOT had previously scheduled appointment with oral surgeon and clear end-point for narcotics use (removal of teeth and possible drainage of abscess) I would NOT have filled script for Percocet.

## 2011-11-10 NOTE — Progress Notes (Signed)
  Subjective:    Patient ID: George Leon, male    DOB: 1956/09/18, 56 y.o.   MRN: [id number]  HPI  1.  Tooth infection:  Has had multiple episodes of this in past, treated with Amoxicillin and Percocet.  Patient has established an appt with oral surgeon this coming Tuesday for consultation, will establish appt for actual treatment of infection with 3-5 days of consultation (per patient).    Currently, complaining of pain and swelling Right lower mandible, pain with eating, pain when cold air touches jaw.  No fevers but does complain of chills.  No nausea, vomiting, abdominal pain.  Eating less due to pain in jaw.  Review of Systems See HPI above for review of systems.       Objective:   Physical Exam Gen:  Alert, cooperative patient who appears stated age in no distress, but clearly in pain.  Vital signs reviewed. Mouth:  MMM.  Right lower mandible at area of canine is swollen and tender to palpation.  No overlying skin edema.  Poor dentition, upper teeth have been removed.   Neck:  Cervical lymphadenopathy noted Right anterior lymph nodes       Assessment & Plan:

## 2011-12-17 ENCOUNTER — Other Ambulatory Visit: Payer: Self-pay | Admitting: Family Medicine

## 2011-12-17 ENCOUNTER — Other Ambulatory Visit: Payer: Self-pay

## 2011-12-17 DIAGNOSIS — S335XXA Sprain of ligaments of lumbar spine, initial encounter: Secondary | ICD-10-CM

## 2011-12-17 DIAGNOSIS — R7989 Other specified abnormal findings of blood chemistry: Secondary | ICD-10-CM

## 2011-12-17 MED ORDER — ATORVASTATIN CALCIUM 10 MG PO TABS: 10.0000 mg | ORAL_TABLET | Freq: Every day | ORAL | Status: DC

## 2011-12-17 MED ORDER — HYDROCODONE-ACETAMINOPHEN 5-325 MG PO TABS: 2.0000 | ORAL_TABLET | Freq: Four times a day (QID) | ORAL | Status: DC | PRN

## 2011-12-17 MED ORDER — ESOMEPRAZOLE MAGNESIUM 40 MG PO CPDR: 40.0000 mg | DELAYED_RELEASE_CAPSULE | Freq: Every day | ORAL | Status: DC

## 2011-12-17 MED ORDER — QUINAPRIL HCL 20 MG PO TABS: 20.0000 mg | ORAL_TABLET | Freq: Every day | ORAL | Status: DC

## 2011-12-17 MED ORDER — METOPROLOL SUCCINATE ER 200 MG PO TB24: 200.0000 mg | ORAL_TABLET | Freq: Every day | ORAL | Status: DC

## 2011-12-17 MED ORDER — AMITRIPTYLINE HCL 25 MG PO TABS: 25.0000 mg | ORAL_TABLET | Freq: Every day | ORAL | Status: DC

## 2011-12-17 NOTE — Progress Notes (Signed)
Pt was here with son.  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  Am refilling for 3 month supply on all meds except for Norco which is one month.  Also due for testosterone level check which I am ordering.

## 2011-12-18 ENCOUNTER — Other Ambulatory Visit: Payer: Self-pay

## 2011-12-20 ENCOUNTER — Other Ambulatory Visit: Payer: Self-pay

## 2011-12-27 ENCOUNTER — Other Ambulatory Visit: Payer: Self-pay

## 2012-01-02 ENCOUNTER — Other Ambulatory Visit: Payer: Self-pay

## 2012-01-02 DIAGNOSIS — R7989 Other specified abnormal findings of blood chemistry: Secondary | ICD-10-CM

## 2012-01-02 NOTE — Progress Notes (Signed)
Labs done today George Leon 

## 2012-01-03 LAB — TESTOSTERONE, % FREE: Testosterone-% Free: 1.5 % — ABNORMAL LOW (ref 1.6–2.9)

## 2012-01-03 LAB — SEX HORMONE BINDING GLOBULIN: Sex Hormone Binding: 50 nmol/L (ref 13–71)

## 2012-01-07 ENCOUNTER — Encounter: Payer: Self-pay | Admitting: Family Medicine

## 2012-01-07 ENCOUNTER — Ambulatory Visit (INDEPENDENT_AMBULATORY_CARE_PROVIDER_SITE_OTHER): Payer: Self-pay | Admitting: Family Medicine

## 2012-01-07 VITALS — BP 140/88 | HR 84 | Temp 97.9°F | Ht 67.0 in | Wt 235.3 lb

## 2012-01-07 DIAGNOSIS — R7989 Other specified abnormal findings of blood chemistry: Secondary | ICD-10-CM

## 2012-01-07 DIAGNOSIS — G8929 Other chronic pain: Secondary | ICD-10-CM

## 2012-01-07 DIAGNOSIS — E291 Testicular hypofunction: Secondary | ICD-10-CM

## 2012-01-07 DIAGNOSIS — M159 Polyosteoarthritis, unspecified: Secondary | ICD-10-CM

## 2012-01-07 MED ORDER — HYDROCODONE-ACETAMINOPHEN 5-500 MG PO TABS: ORAL_TABLET | ORAL | Status: DC

## 2012-01-07 NOTE — Patient Instructions (Signed)
It was good to see you today! The earliest you can get another joint injection is at the end of April. I am giving you a refill on the vicodin.  As has happened before, our clinic's policies on chronic narcotic prescriptions are in the process of changing.  The next time you need a refill we will probably have to do some additional talking about the pain and how the medicine helps. Continue the testosterone at your current dose.  We will plan to recheck your blood levels in 3-4 months.
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
## 2012-01-08 ENCOUNTER — Other Ambulatory Visit: Payer: Self-pay | Admitting: Family Medicine

## 2012-01-08 MED ORDER — ESOMEPRAZOLE MAGNESIUM 40 MG PO CPDR: 40.0000 mg | DELAYED_RELEASE_CAPSULE | Freq: Every day | ORAL | Status: DC

## 2012-01-10 ENCOUNTER — Encounter: Payer: Self-pay | Admitting: Family Medicine

## 2012-01-10 NOTE — Assessment & Plan Note (Signed)
Refill meds

## 2012-01-10 NOTE — Assessment & Plan Note (Signed)
Cont current dose, recheck levels in 3 months.  Currently appropriate and patient appears to be benifiting.

## 2012-01-10 NOTE — Assessment & Plan Note (Signed)
Bilateral knee injections today

## 2012-03-18 ENCOUNTER — Ambulatory Visit (INDEPENDENT_AMBULATORY_CARE_PROVIDER_SITE_OTHER): Payer: Self-pay | Admitting: Family Medicine

## 2012-03-18 ENCOUNTER — Encounter: Payer: Self-pay | Admitting: Family Medicine

## 2012-03-18 VITALS — BP 158/93 | HR 103 | Temp 98.1°F | Ht 69.0 in | Wt 235.1 lb

## 2012-03-18 DIAGNOSIS — R0781 Pleurodynia: Secondary | ICD-10-CM

## 2012-03-18 DIAGNOSIS — R079 Chest pain, unspecified: Secondary | ICD-10-CM

## 2012-03-18 MED ORDER — WRAP/PAIN FREE MISC: 1.0000 | Freq: Once | Status: DC

## 2012-03-18 MED ORDER — OXYCODONE HCL 5 MG PO TABS: 5.0000 mg | ORAL_TABLET | Freq: Four times a day (QID) | ORAL | Status: DC | PRN

## 2012-03-18 NOTE — Patient Instructions (Signed)
Please stop your Vicodin and use oxycodone 5 mg up to 4 times a day for the next one to 2 weeks. I have given you enough for 2 weeks. You can also take some Tylenol up to 3 g or 3000 mg a day Once you feel like the rib pain is under control, go back to regular pain regimen If the pain gets worse or is not helped by the binder, come back in for reevaluation.

## 2012-03-18 NOTE — Assessment & Plan Note (Signed)
Rib pain in a patient complicated by significant narcotic history. I think that he may have a rib fracture because there is a step off and point tenderness. I will put him in a rib binder and increase his pain regimen for the next one to 2 weeks. I discussed his pain regimen with him carefully. He has been using Vicodin 3-5 tablets per day for the last 9 years. This is consistent with what is in the chart. I have asked him to stop the Vicodin and switch to oxycodone 5 mg up to 4 times a day as needed. I given him enough for 2 weeks. He is to use it until his pain is better controlled and then switch back to his regular regimen. he is to return in 2 weeks for followup. He is to return earlier if he is worse. No x-ray today but would consider in the future. Given the low force in his injury, would consider evaluation for osteoporosis in the future especially given his testosterone deficiency.
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
## 2012-04-07 ENCOUNTER — Other Ambulatory Visit: Payer: Self-pay | Admitting: Family Medicine

## 2012-04-07 MED ORDER — ESOMEPRAZOLE MAGNESIUM 40 MG PO CPDR: 40.0000 mg | DELAYED_RELEASE_CAPSULE | Freq: Every day | ORAL | Status: DC

## 2012-04-08 ENCOUNTER — Telehealth: Payer: Self-pay | Admitting: *Deleted

## 2012-04-08 ENCOUNTER — Ambulatory Visit (INDEPENDENT_AMBULATORY_CARE_PROVIDER_SITE_OTHER): Payer: Self-pay | Admitting: Family Medicine

## 2012-04-08 ENCOUNTER — Encounter: Payer: Self-pay | Admitting: Family Medicine

## 2012-04-08 VITALS — BP 138/96 | HR 82 | Temp 97.6°F | Ht 67.0 in | Wt 233.7 lb

## 2012-04-08 DIAGNOSIS — R0781 Pleurodynia: Secondary | ICD-10-CM

## 2012-04-08 DIAGNOSIS — J069 Acute upper respiratory infection, unspecified: Secondary | ICD-10-CM

## 2012-04-08 DIAGNOSIS — R079 Chest pain, unspecified: Secondary | ICD-10-CM

## 2012-04-08 DIAGNOSIS — M159 Polyosteoarthritis, unspecified: Secondary | ICD-10-CM

## 2012-04-08 DIAGNOSIS — G8929 Other chronic pain: Secondary | ICD-10-CM

## 2012-04-08 MED ORDER — HYDROCODONE-ACETAMINOPHEN 5-500 MG PO TABS: ORAL_TABLET | ORAL | Status: DC

## 2012-04-08 MED ORDER — OXYCODONE HCL 5 MG PO TABS: 5.0000 mg | ORAL_TABLET | Freq: Four times a day (QID) | ORAL | Status: AC | PRN

## 2012-04-08 NOTE — Telephone Encounter (Signed)
Received call from Jillyn Hidden at [facility].  Calling to verify Oxycodone Rx and has question about Rx received for Hydrocodone.  Wants to make sure Dr. Louanne Belton wants patient to take both meds.  Also, Hydrocodone Rx has note not to fill until 01/17/12.  Will check with Dr. Louanne Belton and call pharmacy back.   Gaylene Brooks, RN

## 2012-04-08 NOTE — Assessment & Plan Note (Signed)
I'm happy to provide the patient with knee injections, however he will need to make a separate appointment for this.
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
## 2012-04-08 NOTE — Telephone Encounter (Signed)
Ok per Dr. Louanne Belton for patient to take both meds and can fill hydrocodone today.  Pharmacy informed.  Gaylene Brooks, RN

## 2012-04-08 NOTE — Assessment & Plan Note (Signed)
I will provide the patient with a refill on his medications. We are using Vicodin currently due to cost concerns.

## 2012-04-08 NOTE — Patient Instructions (Signed)
It was good to see you today! If you start having problems breathing, start running high fevers, or start coughing up a lot more, come back to see [place]. I am giving you another 35 of the straight oxycodone while your rib finishes healing.  I do not expect that I will be giving you more of these for this condition.  I am also giving you a new prescription for your Vicodin. Make an appointment with me sometime in the next several weeks for your knee injections.

## 2012-04-08 NOTE — Assessment & Plan Note (Signed)
Pain does appear to be improving, but is not completely resolved. Due to the patient's relatively high tolerance to opiates, I will give him 35 additional 5 mg oxycodone pills. I made clear to him that I will not be continuing this into the future. We are unable to obtain an x-ray to determine if there is an actual fracture due to the fact that he does not have an orange card and cannot afford the price of the x-ray.

## 2012-04-13 ENCOUNTER — Other Ambulatory Visit: Payer: Self-pay | Admitting: Family Medicine

## 2012-04-13 DIAGNOSIS — G8929 Other chronic pain: Secondary | ICD-10-CM

## 2012-04-23 ENCOUNTER — Ambulatory Visit (INDEPENDENT_AMBULATORY_CARE_PROVIDER_SITE_OTHER): Payer: Self-pay | Admitting: Family Medicine

## 2012-04-23 ENCOUNTER — Encounter: Payer: Self-pay | Admitting: Family Medicine

## 2012-04-23 VITALS — BP 179/108 | HR 85 | Temp 98.1°F | Ht 67.0 in | Wt 234.7 lb

## 2012-04-23 DIAGNOSIS — M159 Polyosteoarthritis, unspecified: Secondary | ICD-10-CM

## 2012-04-23 DIAGNOSIS — G8929 Other chronic pain: Secondary | ICD-10-CM

## 2012-04-23 MED ORDER — CYCLOBENZAPRINE HCL 10 MG PO TABS: 10.0000 mg | ORAL_TABLET | Freq: Three times a day (TID) | ORAL | Status: AC | PRN

## 2012-04-23 NOTE — Assessment & Plan Note (Signed)
Gave flexeril for acute low back strain today.  He requests change to vicodin 5/500 to 5/325 due to cost.  Just received rx with 2 refills on 04/08/12- not urgent.  Will send to his PCP to address.

## 2012-04-23 NOTE — Assessment & Plan Note (Signed)
Bilateral knees injected today with steroid.  Advised on aftercare and red flags for return.

## 2012-04-23 NOTE — Patient Instructions (Signed)
Both knees injected today.  Flexeril for back strain  I will send a message to Dr. Louanne Belton about your medication switch.  You should have enough medicine until then.  Follow-up with Dr. Louanne Belton  Lumbosacral Strain Lumbosacral strain is one of the most common causes of back pain. There are many causes of back pain. Most are not serious conditions. CAUSES   Your backbone (spinal column) is made up of 24 main vertebral bodies, the sacrum, and the coccyx. These are held together by muscles and tough, fibrous tissue (ligaments). Nerve roots pass through the openings between the vertebrae. A sudden move or injury to the back may cause injury to, or pressure on, these nerves. This may result in localized back pain or pain movement (radiation) into the buttocks, down the leg, and into the foot. Sharp, shooting pain from the buttock down the back of the leg (sciatica) is frequently associated with a ruptured (herniated) disk. Pain may be caused by muscle spasm alone. Your caregiver can often find the cause of your pain by the details of your symptoms and an exam. In some cases, you may need tests (such as X-rays). Your caregiver will work with you to decide if any tests are needed based on your specific exam. HOME CARE INSTRUCTIONS    Avoid an underactive lifestyle. Active exercise, as directed by your caregiver, is your greatest weapon against back pain.   Avoid hard physical activities (tennis, racquetball, waterskiing) if you are not in proper physical condition for it. This may aggravate or create problems.   If you have a back problem, avoid sports requiring sudden body movements. Swimming and walking are generally safer activities.   Maintain good posture.   Avoid becoming overweight (obese).   Use bed rest for only the most extreme, sudden (acute) episode. Your caregiver will help you determine how much bed rest is necessary.   For acute conditions, you may put ice on the injured area.   Put  ice in a plastic bag.   Place a towel between your skin and the bag.   Leave the ice on for 15 to 20 minutes at a time, every 2 hours, or as needed.   After you are improved and more active, it may help to apply heat for 30 minutes before activities.  See your caregiver if you are having pain that lasts longer than expected. Your caregiver can advise appropriate exercises or therapy if needed. With conditioning, most back problems can be avoided. SEEK IMMEDIATE MEDICAL CARE IF:    You have numbness, tingling, weakness, or problems with the use of your arms or legs.   You experience severe back pain not relieved with medicines.   There is a change in bowel or bladder control.   You have increasing pain in any area of the body, including your belly (abdomen).   You notice shortness of breath, dizziness, or feel faint.   You feel sick to your stomach (nauseous), are throwing up (vomiting), or become sweaty.   You notice discoloration of your toes or legs, or your feet get very cold.   Your back pain is getting worse.   You have a fever.  MAKE SURE YOU:    Understand these instructions.   Will watch your condition.   Will get help right away if you are not doing well or get worse.  Document Released: 09/04/2005 Document Revised: 11/14/2011 Document Reviewed: 02/24/2009 [facility] Patient Information 2012 [place], [place].

## 2012-04-23 NOTE — Progress Notes (Signed)
  Subjective:    Patient ID: George Leon, male    DOB: [birth year], 56 y.o.   MRN: [id number]  HPI Here for low back pain and knee injection  Knees:  Bilateral knee arthritis,  Gets steroid injections 3-4 times per year with good relief x 3 months.  Last injection 01-07-2012.  Requests injections today.  Back pain:  Chronic low back pain, strained it 4 days ago trying to get out of a memory foam bed.  Feels some shooting pain down his legs.  No numbness, urinary or fecal incontinence or weakness.  Requests refill of pain medications- states his 5/500 are now too expensive and would like 5/325's.  Last fill by his PCP 04/08/12   Review of Systemssee HPI    Objective:   Physical Exam GEN: Alert & Oriented, No acute distress, obese Back: paraspinal ttp lumbar, sacral spine.  Neg straight leg raise.  strength LE 5/5.  Bilateral knee injection:  A steroid injection was performed at both knees, sterilized with iodine and alcohol swab. Cold spray applied.  medial approach using 1% plain Lidocaine and 40 mg of Kenalog. This was well tolerated.         Assessment & Plan:

## 2012-06-17 ENCOUNTER — Ambulatory Visit (INDEPENDENT_AMBULATORY_CARE_PROVIDER_SITE_OTHER): Payer: Self-pay | Admitting: Family Medicine

## 2012-06-17 ENCOUNTER — Encounter: Payer: Self-pay | Admitting: Family Medicine

## 2012-06-17 VITALS — BP 142/90 | HR 77 | Temp 97.2°F | Wt 234.0 lb

## 2012-06-17 DIAGNOSIS — K224 Dyskinesia of esophagus: Secondary | ICD-10-CM

## 2012-06-17 DIAGNOSIS — G8929 Other chronic pain: Secondary | ICD-10-CM

## 2012-06-17 MED ORDER — DIAZEPAM 2 MG PO TABS: 2.0000 mg | ORAL_TABLET | Freq: Four times a day (QID) | ORAL | Status: DC | PRN

## 2012-06-17 MED ORDER — HYDROCODONE-ACETAMINOPHEN 5-325 MG PO TABS: 1.0000 | ORAL_TABLET | Freq: Four times a day (QID) | ORAL | Status: DC | PRN

## 2012-06-17 NOTE — Progress Notes (Signed)
Subjective: The patient is a 56 y.o. year old male who presents today for followup:  1. Back pain: Patient reports that his back pain and sciatica has improved significantly since his last visit. He inquires whether or not an epidural steroid injection might be something to be considered at some time in the future. He is not having significant numbness. He is not having any bowel or bladder symptoms. He is not having any weakness.  2. Neck pain: Patient returns today for refill on his pain medications. He is requesting this pain medicine be changed back to Norco from Vicodin. This pain has been relatively stable. He is making a request for an increase in the narcotic amount. He is not experiencing any new symptoms. He does report that the medication seems to be wearing off after 2-3 hours.  3. Abdominal/chest pain: Patient reports that he has had problems with spasms in his epigastric region intermittently for some time now. The spasms appear to be related to stress. They're intensely painful and are made significantly worse by swallowing. He is not previously mentioned them in his office appointments. He has not had any weight loss or dysphasia.  He was given a valium pill by his brother and tried taking it sublingually and found immediate relief.  Patient's past medical, social, and family history were reviewed and updated as appropriate. History  Substance Use Topics  . Smoking status: Current Everyday Smoker -- 1.5 packs/day  . Smokeless tobacco: Never Used  . Alcohol Use: No   Objective:  Filed Vitals:   06/17/12 1048  BP: 142/90  Pulse: 77  Temp: 97.2 F (36.2 C)   Gen: Obese male, no immediate distress Abd: Obese, no hernias, no pain on palpation. Abdomen is soft and nondistended. Back: Back is without obvious deformity. There are no palpated deformities or point tenderness.  Assessment/Plan:  I would be happy to refer the patient for an epidural steroid injection, unfortunately  do to his lack of insurance this with problematic. As he is currently asymptomatic we will hold off on any attempted referral.  Please also see individual problems in problem list for problem-specific plans.

## 2012-06-17 NOTE — Patient Instructions (Signed)
It was great to see you today! Come back to see me in 2 months. If you need to come back for labs, please schedule a lab appointment at the front desk on your way out. If we drew labs today, I will call you if any results are abnormal.  Otherwise you will get a letter in the mail.

## 2012-06-17 NOTE — Assessment & Plan Note (Signed)
I discussed with the patient the role of chronic narcotics in management of pain. I am not interested in increasing the dose of the narcotic. I am happy to change from the 500 mg of Tylenol to 325 mg. I provided him with a one-month supply with 2 refills.

## 2012-06-17 NOTE — Assessment & Plan Note (Signed)
The patient's description I wonder if this could be esophageal spasm. I will write him with a limited supply of diazepam as this has helped. If it continues to be problematic we may try a calcium channel blocker these are better evidence and benzodiazepines.

## 2012-07-22 ENCOUNTER — Other Ambulatory Visit: Payer: Self-pay | Admitting: Family Medicine

## 2012-07-22 ENCOUNTER — Encounter: Payer: Self-pay | Admitting: Family Medicine

## 2012-07-22 ENCOUNTER — Ambulatory Visit (INDEPENDENT_AMBULATORY_CARE_PROVIDER_SITE_OTHER): Payer: Self-pay | Admitting: Family Medicine

## 2012-07-22 VITALS — BP 158/92 | Temp 98.2°F | Wt 239.0 lb

## 2012-07-22 DIAGNOSIS — M159 Polyosteoarthritis, unspecified: Secondary | ICD-10-CM

## 2012-07-22 DIAGNOSIS — E7800 Pure hypercholesterolemia, unspecified: Secondary | ICD-10-CM

## 2012-07-22 DIAGNOSIS — G8929 Other chronic pain: Secondary | ICD-10-CM

## 2012-07-22 DIAGNOSIS — I10 Essential (primary) hypertension: Secondary | ICD-10-CM

## 2012-07-22 DIAGNOSIS — E291 Testicular hypofunction: Secondary | ICD-10-CM

## 2012-07-22 DIAGNOSIS — E349 Endocrine disorder, unspecified: Secondary | ICD-10-CM

## 2012-07-22 MED ORDER — AMLODIPINE BESYLATE 5 MG PO TABS: 5.0000 mg | ORAL_TABLET | Freq: Every day | ORAL | Status: DC

## 2012-07-22 MED ORDER — AMLODIPINE BESYLATE 2.5 MG PO TABS: 5.0000 mg | ORAL_TABLET | Freq: Every day | ORAL | Status: DC

## 2012-07-22 MED ORDER — HYDROCODONE-ACETAMINOPHEN 5-325 MG PO TABS
1.0000 | ORAL_TABLET | Freq: Four times a day (QID) | ORAL | Status: DC | PRN
Start: 2012-07-28 — End: 2012-09-15

## 2012-07-22 NOTE — Patient Instructions (Signed)
I have called the pharmacy about your Norco.  I have changed it to 1-2 pills every 6 hours as needed with a monthly number of 180.  You will be able to pick up the new prescription on 07/28/12.  (You should have adequate pills until then as you picked up your last prescription on 07/15/12). I will send you a letter about the results of your testosterone, if we need to adjust the dose any. We are starting the medication Amlodipine for your blood pressure.  Please come back for a nurse blood pressure check in about 2 weeks.  We may need to go up from 5mg  daily to 10mg  daily.

## 2012-07-22 NOTE — Assessment & Plan Note (Signed)
Injection today.  Next knee injection 10/2012.

## 2012-07-22 NOTE — Assessment & Plan Note (Signed)
Called pharmacy and changed rx.  Details in patient instructions.  Will not increase dose beyond 180 per month.

## 2012-07-22 NOTE — Progress Notes (Signed)
Patient ID: George Leon, male   DOB: 01/25/1956, 56 y.o.   MRN: [id number] Subjective: The patient is a 55 y.o. year old male who presents today for multiple issues  1. Knee pain: Patient gets q3 month steroid injections in knees.  This is for OA of knees.  Is here for this today.  Not interested in knee replacement at this time as symptoms well controlled with injections.  2. Neck pain: chronic.  Reports pharmacy changed him from 180 to 120 pills per month due to instructions on rx being 4 pills per day.  Requesting this be changed back.  3. BP: Patient reports bp elevated at home when checking.  No CP/SOB/DOE.  No new headaches/visual changes.  4. Testosterone: Reports continued self administration of testosterone replacement.  No problems with injections.  Reports fatigue much improved.  Is time to re-test levels to make certain replacement is appropriate.  Patient's past medical, social, and family history were reviewed and updated as appropriate. History  Substance Use Topics  . Smoking status: Current Everyday Smoker -- 1.5 packs/day  . Smokeless tobacco: Never Used  . Alcohol Use: No   Objective:  Filed Vitals:   07/22/12 1401  BP: 158/92  Temp: 98.2 F (36.8 C)   Gen: NAD, obese CV: RRR Ext: No swelling of lower extremities.  Bilateral knee injection: A steroid injection was performed at both knees, sterilized with iodine. Cold spray applied. Medial approach using 1% plain Lidocaine and 40 mg of Kenalog. This was well tolerated.   Assessment/Plan:  Please also see individual problems in problem list for problem-specific plans.

## 2012-07-22 NOTE — Assessment & Plan Note (Signed)
On continued replacement.  Recheck levels today.

## 2012-07-23 LAB — BASIC METABOLIC PANEL
CO2: 25 mEq/L (ref 19–32)
Chloride: 105 mEq/L (ref 96–112)
Creat: 0.53 mg/dL (ref 0.50–1.35)

## 2012-07-23 LAB — LDL CHOLESTEROL, DIRECT: Direct LDL: 85 mg/dL

## 2012-08-04 ENCOUNTER — Other Ambulatory Visit: Payer: Self-pay | Admitting: Family Medicine

## 2012-08-04 MED ORDER — QUINAPRIL HCL 20 MG PO TABS: 20.0000 mg | ORAL_TABLET | Freq: Every day | ORAL | Status: DC

## 2012-08-25 ENCOUNTER — Ambulatory Visit (INDEPENDENT_AMBULATORY_CARE_PROVIDER_SITE_OTHER): Payer: Self-pay | Admitting: Family Medicine

## 2012-08-25 ENCOUNTER — Encounter: Payer: Self-pay | Admitting: Family Medicine

## 2012-08-25 VITALS — BP 112/82 | HR 82 | Temp 98.7°F | Ht 67.0 in | Wt 238.0 lb

## 2012-08-25 DIAGNOSIS — K224 Dyskinesia of esophagus: Secondary | ICD-10-CM

## 2012-08-25 DIAGNOSIS — Z23 Encounter for immunization: Secondary | ICD-10-CM

## 2012-08-25 DIAGNOSIS — G8929 Other chronic pain: Secondary | ICD-10-CM

## 2012-08-25 DIAGNOSIS — I10 Essential (primary) hypertension: Secondary | ICD-10-CM

## 2012-08-25 MED ORDER — DIAZEPAM 2 MG PO TABS: 2.0000 mg | ORAL_TABLET | Freq: Four times a day (QID) | ORAL | Status: DC | PRN

## 2012-08-25 MED ORDER — DILTIAZEM HCL 60 MG PO TABS: 60.0000 mg | ORAL_TABLET | Freq: Three times a day (TID) | ORAL | Status: DC

## 2012-08-25 MED ORDER — PREDNISONE 50 MG PO TABS: 50.0000 mg | ORAL_TABLET | Freq: Every day | ORAL | Status: DC

## 2012-08-25 NOTE — Assessment & Plan Note (Signed)
Not able to see GI due to insurance.  On nexium for reflux.  Could, conceivable, by reflux esophagitis but most likely is espohageal spasm.  Will refill diazapam and also start TID dilt.  RTC 1-2 months.

## 2012-08-25 NOTE — Progress Notes (Signed)
Patient ID: George Leon, male   DOB: [birth year], 56 y.o.   MRN: [id number] Subjective: The patient is a 56 y.o. year old male who presents today for back pain.  1. Back pain: Several weeks of increased back pain.  No obvious inciting factor.  Pain is fairly constant.  No symptoms of sciatica, no weakness.  Pain in low back.  2. Esophageal spasm:  Has been having significant pain with eating.  Is out of diazapam, which had previously helped.  No actual dysphagia, just pain with swallowing solids.  No obvious inciting factor.  His mother has history of similar problem.  3. HTN: Tolerating norvasc w/o complications.  BP better controlled.  No CP or new SOB  Patient's past medical, social, and family history were reviewed and updated as appropriate. History  Substance Use Topics  . Smoking status: Current Every Day Smoker -- 1.5 packs/day  . Smokeless tobacco: Never Used  . Alcohol Use: No   Objective:  Filed Vitals:   08/25/12 0959  BP: 112/82  Pulse: 82  Temp: 98.7 F (37.1 C)   Gen: Obese, NAD Back: Diffuse paraspinous muscle tenderness in low back Abd: No obvious deformities or hernias Ext: No edema  Assessment/Plan:  Please also see individual problems in problem list for problem-specific plans.

## 2012-08-25 NOTE — Patient Instructions (Signed)
It was good to see you today! We will try a 7 day steroid burst to see if that helps your back. I am giving you another prescription for valium for your esophageal pain. I also want you to try taking the medicine diltiazem three times daily for your esophagus.

## 2012-08-25 NOTE — Assessment & Plan Note (Signed)
Per patient request will try steroid burst.  Informed patient that this would be somewhat less likely to work in him due to lack of sciatica symptoms but he feels this is appropriate to try given that only other options is eval for epidural steroid injection, which he is somewhat hesitant to do.

## 2012-08-25 NOTE — Assessment & Plan Note (Signed)
Improved.  Continue current treatment.  If dilt helps with esophagus, may come off of amlodipine and transition just to dilt.

## 2012-09-15 ENCOUNTER — Encounter (HOSPITAL_COMMUNITY): Payer: Self-pay | Admitting: Physical Medicine and Rehabilitation

## 2012-09-15 ENCOUNTER — Emergency Department (HOSPITAL_COMMUNITY)
Admission: EM | Admit: 2012-09-15 | Discharge: 2012-09-15 | Disposition: A | Discharge status: Home / Self Care | Payer: Self-pay | Attending: Emergency Medicine | Admitting: Emergency Medicine

## 2012-09-15 DIAGNOSIS — W260XXA Contact with knife, initial encounter: Secondary | ICD-10-CM | POA: Insufficient documentation

## 2012-09-15 DIAGNOSIS — Y998 Other external cause status: Secondary | ICD-10-CM | POA: Insufficient documentation

## 2012-09-15 DIAGNOSIS — F17200 Nicotine dependence, unspecified, uncomplicated: Secondary | ICD-10-CM | POA: Insufficient documentation

## 2012-09-15 DIAGNOSIS — G8929 Other chronic pain: Secondary | ICD-10-CM | POA: Insufficient documentation

## 2012-09-15 DIAGNOSIS — I252 Old myocardial infarction: Secondary | ICD-10-CM | POA: Insufficient documentation

## 2012-09-15 DIAGNOSIS — I10 Essential (primary) hypertension: Secondary | ICD-10-CM | POA: Insufficient documentation

## 2012-09-15 DIAGNOSIS — S61209A Unspecified open wound of unspecified finger without damage to nail, initial encounter: Secondary | ICD-10-CM | POA: Insufficient documentation

## 2012-09-15 DIAGNOSIS — S61219A Laceration without foreign body of unspecified finger without damage to nail, initial encounter: Secondary | ICD-10-CM

## 2012-09-15 DIAGNOSIS — Y9389 Activity, other specified: Secondary | ICD-10-CM | POA: Insufficient documentation

## 2012-09-15 MED ORDER — BACITRACIN 500 UNIT/GM EX OINT
1.0000 "application " | TOPICAL_OINTMENT | Freq: Once | CUTANEOUS | Status: AC
  Administered 2012-09-15: 1 via TOPICAL

## 2012-09-15 MED ORDER — TETANUS-DIPHTH-ACELL PERTUSSIS 5-2.5-18.5 LF-MCG/0.5 IM SUSP
0.5000 mL | Freq: Once | INTRAMUSCULAR | Status: AC
  Administered 2012-09-15: 0.5 mL via INTRAMUSCULAR
  Filled 2012-09-15: qty 0.5

## 2012-09-15 MED ORDER — IBUPROFEN 800 MG PO TABS: 800.0000 mg | ORAL_TABLET | Freq: Three times a day (TID) | ORAL | Status: DC | PRN

## 2012-09-15 MED ORDER — CEPHALEXIN 500 MG PO CAPS: 500.0000 mg | ORAL_CAPSULE | Freq: Two times a day (BID) | ORAL | Status: DC
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
## 2012-09-15 NOTE — ED Provider Notes (Signed)
Medical screening examination/treatment/procedure(s) were performed by non-physician practitioner and as supervising physician I was immediately available for consultation/collaboration.   Carleene Cooper III, MD 09/15/12 2022
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
## 2012-10-16 ENCOUNTER — Ambulatory Visit (INDEPENDENT_AMBULATORY_CARE_PROVIDER_SITE_OTHER): Payer: Self-pay | Admitting: Family Medicine

## 2012-10-16 ENCOUNTER — Encounter: Payer: Self-pay | Admitting: Family Medicine

## 2012-10-16 VITALS — BP 113/96 | HR 73 | Temp 98.1°F | Wt 248.0 lb

## 2012-10-16 DIAGNOSIS — M159 Polyosteoarthritis, unspecified: Secondary | ICD-10-CM

## 2012-10-16 DIAGNOSIS — G8929 Other chronic pain: Secondary | ICD-10-CM

## 2012-10-16 DIAGNOSIS — Z23 Encounter for immunization: Secondary | ICD-10-CM

## 2012-10-16 MED ORDER — HYDROCODONE-ACETAMINOPHEN 5-325 MG PO TABS: 1.0000 | ORAL_TABLET | Freq: Four times a day (QID) | ORAL | Status: DC | PRN

## 2012-10-16 MED ORDER — HYDROCODONE-ACETAMINOPHEN 5-325 MG PO TABS: 2.0000 | ORAL_TABLET | Freq: Four times a day (QID) | ORAL | Status: DC

## 2012-10-16 MED ORDER — DIAZEPAM 2 MG PO TABS: 2.0000 mg | ORAL_TABLET | Freq: Four times a day (QID) | ORAL | Status: DC | PRN

## 2012-10-16 MED ORDER — DILTIAZEM HCL 60 MG PO TABS: 60.0000 mg | ORAL_TABLET | Freq: Three times a day (TID) | ORAL | Status: DC

## 2012-10-16 MED ORDER — AMLODIPINE BESYLATE 5 MG PO TABS: 5.0000 mg | ORAL_TABLET | Freq: Every day | ORAL | Status: DC

## 2012-10-16 NOTE — Patient Instructions (Signed)
It was good to see you today! Plan on coming back in about 3 months

## 2012-10-22 NOTE — Assessment & Plan Note (Signed)
Pt requesting that rx be written for #240 so he will only have to fill it once every month and a half.  We will give a trial on this.  If 180 pills have been lasting for 30 days, 240 pills should last for 40 days.

## 2012-10-22 NOTE — Assessment & Plan Note (Signed)
Injected today, no complications.
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
## 2012-10-24 ENCOUNTER — Other Ambulatory Visit: Payer: Self-pay | Admitting: Family Medicine

## 2012-12-07 ENCOUNTER — Other Ambulatory Visit: Payer: Self-pay | Admitting: Family Medicine

## 2013-01-05 ENCOUNTER — Telehealth: Payer: Self-pay | Admitting: *Deleted

## 2013-01-05 NOTE — Telephone Encounter (Signed)
Needs refill on Toprol XL sent to [facility] pharmacy. Fax placed in Dr. Jamie Kato box.

## 2013-01-14 MED ORDER — METOPROLOL SUCCINATE ER 200 MG PO TB24: 200.0000 mg | ORAL_TABLET | Freq: Every day | ORAL | Status: DC

## 2013-01-15 ENCOUNTER — Ambulatory Visit (INDEPENDENT_AMBULATORY_CARE_PROVIDER_SITE_OTHER): Payer: No Typology Code available for payment source | Admitting: Family Medicine

## 2013-01-15 ENCOUNTER — Encounter: Payer: Self-pay | Admitting: Family Medicine

## 2013-01-15 VITALS — BP 186/111 | HR 79 | Temp 99.3°F | Ht 67.0 in | Wt 242.0 lb

## 2013-01-15 DIAGNOSIS — K224 Dyskinesia of esophagus: Secondary | ICD-10-CM

## 2013-01-15 DIAGNOSIS — G8929 Other chronic pain: Secondary | ICD-10-CM

## 2013-01-15 DIAGNOSIS — M159 Polyosteoarthritis, unspecified: Secondary | ICD-10-CM

## 2013-01-15 DIAGNOSIS — I10 Essential (primary) hypertension: Secondary | ICD-10-CM

## 2013-01-15 MED ORDER — AMLODIPINE BESYLATE 5 MG PO TABS: 5.0000 mg | ORAL_TABLET | Freq: Every day | ORAL | Status: DC

## 2013-01-15 MED ORDER — METHYLPREDNISOLONE ACETATE 80 MG/ML IJ SUSP
80.0000 mg | Freq: Once | INTRAMUSCULAR | Status: AC
  Administered 2013-01-15: 80 mg via INTRAMUSCULAR

## 2013-01-15 MED ORDER — QUINAPRIL HCL 20 MG PO TABS: 20.0000 mg | ORAL_TABLET | Freq: Every morning | ORAL | Status: DC

## 2013-01-15 MED ORDER — HYDROCODONE-ACETAMINOPHEN 5-325 MG PO TABS: 2.0000 | ORAL_TABLET | Freq: Four times a day (QID) | ORAL | Status: DC

## 2013-01-15 MED ORDER — METOPROLOL SUCCINATE ER 200 MG PO TB24: 200.0000 mg | ORAL_TABLET | Freq: Every day | ORAL | Status: DC

## 2013-01-15 MED ORDER — ESOMEPRAZOLE MAGNESIUM 40 MG PO CPDR: 40.0000 mg | DELAYED_RELEASE_CAPSULE | Freq: Every day | ORAL | Status: DC | PRN

## 2013-01-15 MED ORDER — DILTIAZEM HCL 60 MG PO TABS: 60.0000 mg | ORAL_TABLET | Freq: Three times a day (TID) | ORAL | Status: DC

## 2013-01-15 MED ORDER — DIAZEPAM 2 MG PO TABS: 2.0000 mg | ORAL_TABLET | Freq: Four times a day (QID) | ORAL | Status: DC | PRN

## 2013-01-15 NOTE — Assessment & Plan Note (Signed)
Restart Norvasc. Patient to monitor blood pressure at home and is not at goal he will call and make an appointment for titration medications.

## 2013-01-15 NOTE — Assessment & Plan Note (Signed)
Bilateral knee injections. Discussed natural history of disease and the fact he will likely need knee replacements at some point in time.

## 2013-01-15 NOTE — Assessment & Plan Note (Signed)
Pain meds refilled. Refills on time.
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
## 2013-01-15 NOTE — Addendum Note (Signed)
Addended by: Farrell Ours on: 01/15/2013 03:41 PM   Modules accepted: Orders

## 2013-01-15 NOTE — Patient Instructions (Signed)
It was great to see you today! We injected your knees today. I have refilled your medications today. Plan on stopping by once you are back on all your blood pressure medications for several weeks to get your blood pressure rechecked.  Make a nurse visit for this. We should check your stool for blood.

## 2013-01-15 NOTE — Assessment & Plan Note (Signed)
Meds refills

## 2013-03-15 ENCOUNTER — Other Ambulatory Visit: Payer: Self-pay | Admitting: Family Medicine

## 2013-03-16 ENCOUNTER — Encounter: Payer: Self-pay | Admitting: Family Medicine

## 2013-03-16 ENCOUNTER — Ambulatory Visit (INDEPENDENT_AMBULATORY_CARE_PROVIDER_SITE_OTHER): Payer: No Typology Code available for payment source | Admitting: Family Medicine

## 2013-03-16 VITALS — BP 192/95 | HR 74 | Temp 98.0°F | Wt 241.1 lb

## 2013-03-16 DIAGNOSIS — G8929 Other chronic pain: Secondary | ICD-10-CM

## 2013-03-16 DIAGNOSIS — I10 Essential (primary) hypertension: Secondary | ICD-10-CM

## 2013-03-16 DIAGNOSIS — K224 Dyskinesia of esophagus: Secondary | ICD-10-CM

## 2013-03-16 MED ORDER — QUINAPRIL HCL 20 MG PO TABS: 20.0000 mg | ORAL_TABLET | Freq: Every morning | ORAL | Status: DC

## 2013-03-16 MED ORDER — HYDROCODONE-ACETAMINOPHEN 5-325 MG PO TABS: 2.0000 | ORAL_TABLET | Freq: Four times a day (QID) | ORAL | Status: DC

## 2013-03-16 MED ORDER — DIAZEPAM 2 MG PO TABS: 2.0000 mg | ORAL_TABLET | Freq: Four times a day (QID) | ORAL | Status: DC | PRN

## 2013-03-16 MED ORDER — DIAZEPAM 2 MG PO TABS: 2.0000 mg | ORAL_TABLET | Freq: Four times a day (QID) | ORAL | Status: AC | PRN

## 2013-03-16 MED ORDER — METOPROLOL SUCCINATE ER 200 MG PO TB24: 200.0000 mg | ORAL_TABLET | Freq: Every day | ORAL | Status: DC

## 2013-03-16 MED ORDER — AMLODIPINE BESYLATE 5 MG PO TABS: 5.0000 mg | ORAL_TABLET | Freq: Every day | ORAL | Status: DC

## 2013-03-16 MED ORDER — DILTIAZEM HCL 60 MG PO TABS: 60.0000 mg | ORAL_TABLET | Freq: Three times a day (TID) | ORAL | Status: DC

## 2013-03-16 MED ORDER — ESOMEPRAZOLE MAGNESIUM 40 MG PO CPDR: 40.0000 mg | DELAYED_RELEASE_CAPSULE | Freq: Every day | ORAL | Status: DC | PRN

## 2013-03-16 MED ORDER — SPIRONOLACTONE 25 MG PO TABS: 25.0000 mg | ORAL_TABLET | Freq: Every day | ORAL | Status: DC

## 2013-03-16 MED ORDER — ATORVASTATIN CALCIUM 10 MG PO TABS: 10.0000 mg | ORAL_TABLET | Freq: Every day | ORAL | Status: DC

## 2013-03-16 NOTE — Patient Instructions (Addendum)
General Instructions:  I have refilled both your pain medications and your diazepam. I am also giving you refills on your other medications that you can fill as needed. We are starting a new medication, aldactone, today for your blood pressure.  You need to come in for blood work to check on it in about 2 weeks.  Treatment Goals:  Goals (1 Years of Data) as of 03/16/13         As of Today 01/15/13 10/16/12 09/15/12 08/25/12     Blood Pressure    . Blood Pressure < 140/90  192/95 186/111 113/96 117/78 112/82     Lifestyle    . Quit smoking / using tobacco           Result Component    . LDL CALC < 130            Progress Toward Treatment Goals:  Treatment Goal 03/16/2013  Blood pressure unchanged  Stop smoking smoking the same amount    Self Care Goals & Plans:  Self Care Goal 03/16/2013  Monitor my health keep track of my blood pressure       Care Management & Community Referrals:  Referral 03/16/2013  Referrals made for care management support none needed

## 2013-03-23 ENCOUNTER — Ambulatory Visit: Payer: No Typology Code available for payment source | Admitting: Family Medicine

## 2013-03-23 NOTE — Assessment & Plan Note (Signed)
Refills changed as noted in overview.

## 2013-03-23 NOTE — Progress Notes (Signed)
Patient ID: JUANDANIEL MANFREDO, male   DOB: 06-Nov-1956, 57 y.o.   MRN: [id number] Subjective: The patient is a 57 y.o. year old male who presents today for routine f/u.  1. Chronic pain: continues on norco qid for back/neck pain.  Would like to switch back to 1 month supply instead of 5 week supply as it is getting out of sync with his other meds.  2. Esophageal spasm: Ends up taking valium 2-3 times per day and verapamil.  Without these meds, he has significant problems with pain after swallowing that feels like a spasm.  Has not seen GI due to insurance problems.  3. HTN: Hypertensive today.  No cp/sob/doe.  Claims compliance with meds.  Patient's past medical, social, and family history were reviewed and updated as appropriate. History  Substance Use Topics  . Smoking status: Current Every Day Smoker -- 1.50 packs/day    Types: Cigarettes  . Smokeless tobacco: Never Used  . Alcohol Use: No   Objective:  Filed Vitals:   03/16/13 1613  BP: 192/95  Pulse: 74  Temp: 98 F (36.7 C)   Gen: NAD, overweight CV: RRR Resp: CTABL Ext: No edema  Assessment/Plan:  Please also see individual problems in problem list for problem-specific plans.

## 2013-03-23 NOTE — Assessment & Plan Note (Signed)
Refill meds.  Would like to see GI but unable to afford.  May need escalation of PPI therapy.

## 2013-03-23 NOTE — Assessment & Plan Note (Signed)
Poorly controlled despite multiple medications.  Unable to see cards or afford any workup.   Will add aldactone and recheck in several weeks.

## 2013-03-30 ENCOUNTER — Ambulatory Visit (INDEPENDENT_AMBULATORY_CARE_PROVIDER_SITE_OTHER): Payer: No Typology Code available for payment source | Admitting: Family Medicine

## 2013-03-30 ENCOUNTER — Encounter: Payer: Self-pay | Admitting: Family Medicine

## 2013-03-30 VITALS — BP 167/95 | HR 78 | Temp 98.1°F | Ht 67.0 in | Wt 241.0 lb

## 2013-03-30 DIAGNOSIS — M533 Sacrococcygeal disorders, not elsewhere classified: Secondary | ICD-10-CM | POA: Insufficient documentation

## 2013-03-30 DIAGNOSIS — M5430 Sciatica, unspecified side: Secondary | ICD-10-CM

## 2013-03-30 DIAGNOSIS — M5432 Sciatica, left side: Secondary | ICD-10-CM

## 2013-03-30 MED ORDER — METHYLPREDNISOLONE ACETATE 40 MG/ML IJ SUSP
40.0000 mg | Freq: Once | INTRAMUSCULAR | Status: AC
  Administered 2013-03-30: 40 mg via INTRAMUSCULAR

## 2013-03-30 MED ORDER — AMITRIPTYLINE HCL 25 MG PO TABS: 25.0000 mg | ORAL_TABLET | Freq: Every day | ORAL | Status: DC

## 2013-03-30 MED ORDER — OXYCODONE-ACETAMINOPHEN 10-325 MG PO TABS: 1.0000 | ORAL_TABLET | Freq: Four times a day (QID) | ORAL | Status: DC | PRN

## 2013-03-30 MED ORDER — CYCLOBENZAPRINE HCL 10 MG PO TABS: 10.0000 mg | ORAL_TABLET | Freq: Once | ORAL | Status: DC

## 2013-03-30 NOTE — Patient Instructions (Signed)
I have given you a few of the percocet 10's to help with sleep. I am also giving you amitriptyline for nerve pain and sleep. Flexeril for muscle spasm.

## 2013-03-30 NOTE — Assessment & Plan Note (Signed)
See PI for plan.  No concerning findings on exam.

## 2013-03-30 NOTE — Progress Notes (Signed)
Patient ID: KYRAN CONNAUGHTON, male   DOB: [birth year], 57 y.o.   MRN: [id number] Subjective: The patient is a 57 y.o. year old male who presents today for sciatica.  Patient reports that one week ago he stood up from a chair and got an immediate pain in his lower back radiating down his left leg. Pain is made worse by standing and is accompanied by some numbness and tingling. The patient has had sciatica before he reports that this is a same sensation. He denies any true weakness or falls. No bowel or bladder dysfunction. He's been taking his standard Vicodin with no relief.  Patient's past medical, social, and family history were reviewed and updated as appropriate. History  Substance Use Topics  . Smoking status: Current Every Day Smoker -- 1.50 packs/day    Types: Cigarettes  . Smokeless tobacco: Never Used  . Alcohol Use: No   Objective:  Filed Vitals:   03/30/13 1413  BP: 167/95  Pulse: 78  Temp: 98.1 F (36.7 C)   Gen: NAD Back: Pain on palpation over left superior SI joint.  Slight pain with pelvic rock. Legs: 5/5 strength bilaterally, 2+ reflexes bilaterally  Procedure note: Consent obtained, time out performed.  Left superior SI joint prepped and injected with 4cc 1% lidocain without epi and 1cc depo-medrol (40mg ).  Pt tolerated well.  Assessment/Plan:   Please also see individual problems in problem list for problem-specific plans.

## 2013-03-30 NOTE — Assessment & Plan Note (Signed)
Injected today.  F/u PRN

## 2013-04-30 ENCOUNTER — Ambulatory Visit (INDEPENDENT_AMBULATORY_CARE_PROVIDER_SITE_OTHER): Payer: No Typology Code available for payment source | Admitting: Family Medicine

## 2013-04-30 ENCOUNTER — Encounter: Payer: Self-pay | Admitting: Family Medicine

## 2013-04-30 VITALS — BP 146/86 | HR 93 | Temp 98.9°F | Ht 67.0 in | Wt 236.0 lb

## 2013-04-30 DIAGNOSIS — M25569 Pain in unspecified knee: Secondary | ICD-10-CM

## 2013-04-30 DIAGNOSIS — I10 Essential (primary) hypertension: Secondary | ICD-10-CM

## 2013-04-30 DIAGNOSIS — M25561 Pain in right knee: Secondary | ICD-10-CM

## 2013-04-30 DIAGNOSIS — M159 Polyosteoarthritis, unspecified: Secondary | ICD-10-CM

## 2013-04-30 MED ORDER — METHYLPREDNISOLONE ACETATE 80 MG/ML IJ SUSP
80.0000 mg | Freq: Once | INTRAMUSCULAR | Status: AC
  Administered 2013-04-30: 80 mg via INTRA_ARTICULAR

## 2013-05-10 NOTE — Assessment & Plan Note (Signed)
Injections today.  07/31/2013 is next injection date.

## 2013-05-10 NOTE — Assessment & Plan Note (Signed)
HTN better controlled today.  Does not wish to make further med changes right now.  Plan to f/u in 1-2 months.  If still elevated, will need further med adjustment.

## 2013-05-10 NOTE — Progress Notes (Signed)
Patient ID: George Leon, male   DOB: 1956-06-26, 57 y.o.   MRN: [id number] Subjective: The patient is a 57 y.o. year old male who presents today for knee injections.  Patient has osteoarthritis of bilateral knees and has been receiving knee injections every 3 months for several years now. He reports that they do provide some relief although the length of time of this relief has been decreasing recently. He reports approximately one to one and a half months of relief currently. He understands it is likely means that he will need knee replacements in the not-too-distant future but does not wish to pursue this currently.  Patient's past medical, social, and family history were reviewed and updated as appropriate. History  Substance Use Topics  . Smoking status: Current Every Day Smoker -- 1.50 packs/day    Types: Cigarettes  . Smokeless tobacco: Never Used  . Alcohol Use: No   Objective:  Filed Vitals:   04/30/13 1357  BP: 146/86  Pulse: 93  Temp: 98.9 F (37.2 C)   INJECTION: Patient was given informed consent, signed copy in the chart. Appropriate time out was taken. Area prepped and draped in usual sterile fashion. 1 cc of methylprednisolone 40 mg/ml plus  3 cc of 1% lidocaine without epinephrine was injected into the bilateral knees using a(n) medial approach. The patient tolerated the procedure well. There were no complications. Post procedure instructions were given.  Assessment/Plan:  Please also see individual problems in problem list for problem-specific plans.

## 2013-06-17 ENCOUNTER — Telehealth: Payer: Self-pay | Admitting: *Deleted

## 2013-06-17 DIAGNOSIS — J449 Chronic obstructive pulmonary disease, unspecified: Secondary | ICD-10-CM

## 2013-06-17 MED ORDER — ALBUTEROL SULFATE HFA 108 (90 BASE) MCG/ACT IN AERS: 1.0000 | INHALATION_SPRAY | RESPIRATORY_TRACT | Status: DC | PRN

## 2013-06-17 NOTE — Telephone Encounter (Signed)
Prescription request for ventolin - not on [facility] Wyatt Haste, RN-BSN

## 2013-07-13 ENCOUNTER — Telehealth: Payer: Self-pay | Admitting: Sports Medicine

## 2013-07-13 NOTE — Telephone Encounter (Signed)
Pt needs enough refill to get to his appointment on 8/15 of hydrocondone 5-325 mg and dilitiazem 2 mg . JW

## 2013-07-13 NOTE — Telephone Encounter (Signed)
Will fwd to MD.  Garland Smouse L, CMA  

## 2013-07-14 ENCOUNTER — Ambulatory Visit (INDEPENDENT_AMBULATORY_CARE_PROVIDER_SITE_OTHER): Payer: No Typology Code available for payment source | Admitting: Sports Medicine

## 2013-07-14 ENCOUNTER — Telehealth: Payer: Self-pay | Admitting: *Deleted

## 2013-07-14 ENCOUNTER — Encounter: Payer: Self-pay | Admitting: Sports Medicine

## 2013-07-14 ENCOUNTER — Ambulatory Visit: Payer: No Typology Code available for payment source | Admitting: Sports Medicine

## 2013-07-14 VITALS — BP 127/78 | HR 97 | Temp 98.8°F | Ht 67.0 in | Wt 241.0 lb

## 2013-07-14 DIAGNOSIS — M533 Sacrococcygeal disorders, not elsewhere classified: Secondary | ICD-10-CM

## 2013-07-14 DIAGNOSIS — G8929 Other chronic pain: Secondary | ICD-10-CM

## 2013-07-14 DIAGNOSIS — M159 Polyosteoarthritis, unspecified: Secondary | ICD-10-CM

## 2013-07-14 DIAGNOSIS — F17200 Nicotine dependence, unspecified, uncomplicated: Secondary | ICD-10-CM

## 2013-07-14 DIAGNOSIS — I10 Essential (primary) hypertension: Secondary | ICD-10-CM

## 2013-07-14 DIAGNOSIS — Z0000 Encounter for general adult medical examination without abnormal findings: Secondary | ICD-10-CM

## 2013-07-14 LAB — COMPREHENSIVE METABOLIC PANEL
Albumin: 4.1 g/dL (ref 3.5–5.2)
CO2: 25 mEq/L (ref 19–32)
Calcium: 9.2 mg/dL (ref 8.4–10.5)
Chloride: 100 mEq/L (ref 96–112)
Glucose, Bld: 84 mg/dL (ref 70–99)
Sodium: 133 mEq/L — ABNORMAL LOW (ref 135–145)
Total Bilirubin: 0.5 mg/dL (ref 0.3–1.2)
Total Protein: 6.6 g/dL (ref 6.0–8.3)

## 2013-07-14 MED ORDER — DIAZEPAM 5 MG PO TABS: 5.0000 mg | ORAL_TABLET | Freq: Two times a day (BID) | ORAL | Status: DC | PRN

## 2013-07-14 MED ORDER — CYCLOBENZAPRINE HCL 10 MG PO TABS: 10.0000 mg | ORAL_TABLET | Freq: Once | ORAL | Status: DC

## 2013-07-14 MED ORDER — HYDROCODONE-ACETAMINOPHEN 5-325 MG PO TABS: 2.0000 | ORAL_TABLET | Freq: Four times a day (QID) | ORAL | Status: DC

## 2013-07-14 MED ORDER — METOPROLOL TARTRATE 50 MG PO TABS: 50.0000 mg | ORAL_TABLET | Freq: Three times a day (TID) | ORAL | Status: DC

## 2013-07-14 NOTE — Assessment & Plan Note (Addendum)
Stable/Well Controlled - no changes at this time. I am checking a couple of labs today Given the patient's significant history of prior MI and questionable angina I spent extensive time in discussing the merits of referral to cardiology.  He declines referral at this time in spite of my recommendations he also declines nitroglycerin and aspirin therapy.  Emergent precautions reviewed with him as well as his high risk of sudden cardiac death. We will continue him on his Lipitor. > Will need to continue to readdress reevaluation by cardiology and aspirin therapy.

## 2013-07-14 NOTE — Patient Instructions (Signed)
It was nice to see you today, thanks for coming in!  Problem List Items Addressed This Visit   TOBACCO ABUSE - Primary     Keep cutting back Chantix and Wellbutrin not tolerated Think about calling [phone].Quit.Now    PAIN, CHRONIC NEC     Refilled #180 today    Relevant Medications      HYDROcodone-acetaminophen (NORCO/VICODIN) 5-325 MG per tablet      cyclobenzaprine (FLEXERIL) tablet   HYPERTENSION, BENIGN     Stable/Well Controlled - no changes at this time. I am checking a couple of labs today    Relevant Medications      metoprolol (LOPRESSOR) tablet   Other Relevant Orders      Comprehensive metabolic panel    Other Visit Diagnoses   Healthcare maintenance        Relevant Orders       Hepatitis C antibody       Please plan to return to see me in 1 month.  If you need anything prior to your next visit please call the clinic.  Please Bring all medications or accurate medication list with you to each appointment; an accurate medication list is essential in providing you the best care possible.

## 2013-07-14 NOTE — Telephone Encounter (Signed)
Patient originally sch'd for today, appt cancelled due to MD overbooked schedule. Patient is completely out of meds and his [employer] runs out tomorrow (reason he was getting his meds today). Patient needs his rx today and needs enough until his appt on 8/15. Please call patient when rx are ready for pickup or sent.

## 2013-07-14 NOTE — Telephone Encounter (Signed)
Pt was scheduled to be seen today at 2:45. George Leon, Virgel Bouquet

## 2013-07-14 NOTE — Assessment & Plan Note (Signed)
Keep cutting back Chantix and Wellbutrin not tolerated Think about calling [phone].Quit.Now

## 2013-07-14 NOTE — Assessment & Plan Note (Addendum)
Refilled #180 today Refilled his Valium for when necessary dosing.  This will need to be addressed at the next visit. Had patient's on chronic pain contract. Injections performed

## 2013-07-14 NOTE — Telephone Encounter (Signed)
Pt appointment rescheduled for 8/6 Wyatt Haste, RN-BSN

## 2013-07-15 LAB — HEPATITIS C ANTIBODY: HCV Ab: NEGATIVE

## 2013-07-15 NOTE — Assessment & Plan Note (Signed)
Left Knee Injection Performed today:  The risks, benefits, and expected outcomes of the injection were reviewed and he wishes to undergo the above named procedure.  After an appropriate time out was taken, the left knee was prepped in a clean fashion and injected from a inferior Lateral, bent knee approach with a 25g syringe using 4cc of 1% plain Lidocaine and 40mg  of methylprednisolone. .  A bandaid was applied to the area.  This procedure was well tolerated and there were no complications.   R Knee Injection Performed today:  The risks, benefits, and expected outcomes of the injection were reviewed and he wishes to undergo the above named procedure.  After an appropriate time out was taken, the R Knee was prepped in a clean fashion and injected from a inferior Lateral, bent approach with a 25g syringe using 4cc of 1% plain Lidocaine and 40mg  of Kenalog. .  A bandaid was applied to the area.  This procedure was well tolerated and there were no complications.

## 2013-07-15 NOTE — Assessment & Plan Note (Signed)
Left SI Injection Performed today:  The risks, benefits, and expected outcomes of the injection were reviewed and he wishes to undergo the above named procedure.  After an appropriate time out was taken, the left SI joint was prepped in a clean fashion and injected directly with a 25-gauge g syringe using 4 cc of 1% plain Lidocaine and 1 cc of 40 mg per mL of methylprednisolone. .  A bandaid was applied to the area.  This procedure was well tolerated and there were no complications.

## 2013-07-15 NOTE — Progress Notes (Signed)
[facility]  Patient name: George Leon MRN [id number]  Date of birth: September 22, 1956  CC & HPI:  George Leon is a 57 y.o. male presenting to clinic.  This is my first time meeting frank as his new PCP  Chief Complaint  Patient presents with  . Medical Managment of Chronic Issues    occasional midsternal discomfort, spasms, #  Hypertension - chronic problem, well controlled, hx of poorly controlled   no orthostasis,   no peripheral edema  occasional chest pain, midsternal described as a muscle spasm.  With last between 2-3 minutes.  Occasional has exertional component. , no dyspnea on exertion, no orthopnea/PND  no episodes of unilateral weakness, dysarthria or acute visual changes  #  Diabetes - chronic problem, poorly controlled.   no hypoglycemic symptoms/episodes.   no poluria, no polydipsia,  no new visual problems,  LE dysesthesias: Yes - chronic and unchanged  self foot checks performed: Weekly  # Tobacco dependence: Patient is potentially interested in quitting but not at this time  TLC Compliance Diet: noncompliant much of the time  Exercise: noncompliant much of the time  MEDS: noncompliant much of the time    . 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 Rash    chornic forarm, easy abraison        ROS:  PER HPI  Pertinent History Reviewed:  Medical & Surgical Hx:  Reviewed: Significant for acute MI  s/p cath, declines further cardiology intervention Medications: Reviewed & Updated - see associated section Social History: Reviewed -  reports that he has been smoking Cigarettes.  He has been smoking about 1.00 pack per day. He has never used smokeless tobacco.  Objective Findings:  Vitals: BP 127/78  Pulse 97  Temp(Src) 98.8 F (37.1 C) (Oral)  Ht 5\' 7"  (1.702 m)  Wt 241 lb (109.317 kg)  BMI 37.74 kg/m2 PE: GENERAL: Adult obese caucasian  male. In no discomfort; no respiratory distress  PSYCH:  alert and appropriate, good insight = slightly behavior but very pleasant   HNEENT:  There is mild hepatomegaly reflux   CARDIO:  RRR, S1/S2 heard, no murmur  LUNGS:  CTA B, no wheezes, no crackles  ABDOMEN:  Protuberant, no fluid wave   EXTREM:   GU:   SKIN: Significant thinning and mild senile pupura with secondary excoriations  NEUROMSK: Lower extremity myotomes R. 5+/5, lower extremity dermatomes are grossly intact to sensation DTRs are diminished diffusely in the lower extremity to 1/4   back exam Exam: Appearance:  normal-appearing   Palpation:  tender to palpation over the left SI joint that causes minimal radicular symptoms down the posterior aspect of his leg to the mid hamstring.    MSK Testing:  bilateral negative straight leg raise       bilateral  knee Exam: Appearance:  chronic osteoarthritic changes   Palpation:  there is mild patellar grind and tenderness palpation over the medial and lateral joint lines bilaterally   ROM: Active Passive  Good however limited in full extension        EKG obtained today:   Assessment & Plan:  See problem associated charting

## 2013-07-16 ENCOUNTER — Encounter: Payer: Self-pay | Admitting: Sports Medicine

## 2013-07-23 ENCOUNTER — Ambulatory Visit: Payer: Self-pay | Admitting: Sports Medicine

## 2013-08-23 ENCOUNTER — Encounter: Payer: Self-pay | Admitting: Sports Medicine

## 2013-08-23 ENCOUNTER — Ambulatory Visit (INDEPENDENT_AMBULATORY_CARE_PROVIDER_SITE_OTHER): Payer: No Typology Code available for payment source | Admitting: Sports Medicine

## 2013-08-23 VITALS — BP 139/87 | HR 87 | Temp 98.2°F | Ht 67.0 in | Wt 239.4 lb

## 2013-08-23 DIAGNOSIS — K224 Dyskinesia of esophagus: Secondary | ICD-10-CM

## 2013-08-23 DIAGNOSIS — F17200 Nicotine dependence, unspecified, uncomplicated: Secondary | ICD-10-CM

## 2013-08-23 DIAGNOSIS — I2510 Atherosclerotic heart disease of native coronary artery without angina pectoris: Secondary | ICD-10-CM

## 2013-08-23 DIAGNOSIS — I739 Peripheral vascular disease, unspecified: Secondary | ICD-10-CM | POA: Insufficient documentation

## 2013-08-23 DIAGNOSIS — Z23 Encounter for immunization: Secondary | ICD-10-CM

## 2013-08-23 DIAGNOSIS — G8929 Other chronic pain: Secondary | ICD-10-CM

## 2013-08-23 DIAGNOSIS — K219 Gastro-esophageal reflux disease without esophagitis: Secondary | ICD-10-CM

## 2013-08-23 DIAGNOSIS — I10 Essential (primary) hypertension: Secondary | ICD-10-CM

## 2013-08-23 MED ORDER — DILTIAZEM HCL 60 MG PO TABS: 60.0000 mg | ORAL_TABLET | Freq: Three times a day (TID) | ORAL | Status: DC

## 2013-08-23 MED ORDER — ASPIRIN EC 81 MG PO TBEC: 81.0000 mg | DELAYED_RELEASE_TABLET | Freq: Every day | ORAL | Status: DC

## 2013-08-23 MED ORDER — DIAZEPAM 5 MG PO TABS: 5.0000 mg | ORAL_TABLET | Freq: Two times a day (BID) | ORAL | Status: DC | PRN

## 2013-08-23 MED ORDER — METOPROLOL SUCCINATE ER 200 MG PO TB24: 200.0000 mg | ORAL_TABLET | Freq: Every day | ORAL | Status: DC

## 2013-08-23 NOTE — Patient Instructions (Signed)
It was nice to see you today, thanks for coming in!  1. Essential hypertension, benign Keep taking your regular BP meds Start ASA 81 mg daily  2. Tobacco use disorder STOP SMOKING!!!!1  3. Esophageal spasm Restart your Diltiazem Valium Refilled USE YOUR NEXIUM DAILY   Please plan to return to see me in 2 months.    If you need anything prior to your next visit please call the clinic. Please Bring all medications or accurate medication list with you to each appointment; an accurate medication list is essential in providing you the best care possible.

## 2013-08-23 NOTE — Assessment & Plan Note (Signed)
Encouraged to quit. 

## 2013-08-23 NOTE — Assessment & Plan Note (Signed)
Refilled Valium & restart Diltizem Encouraged daily use of nexium

## 2013-08-23 NOTE — Assessment & Plan Note (Addendum)
Claudication like symptoms. Start ASA > Consider starting Plavix/Pletal in the future but he should undergo a more thorough evaluation for that; declines at this time

## 2013-08-23 NOTE — Assessment & Plan Note (Signed)
Should be on his daily PPI

## 2013-08-23 NOTE — Progress Notes (Signed)
  [facility]  George Leon - 57 y.o. male MRN [id number]  Date of birth: 09/04/1956  CC & HPI & ROS  George Leon presents today for followup of his chronic medical problems:  #  Hypertension & CAD - chronic problem, marginally controlled.     no orthostasis,   no peripheral edema  no chest pain, no dyspnea on exertion, no orthopnea/PND at this time  no episodes of unilateral weakness, dysarthria or acute visual changes  He is having pain in the back of his legs  Declines further cardiology evaluation at this time   Not taking a baby aspirin  #  HLD - chronic problem, well controlled.    Continues Statin  No complaints  TLC Compliance Diet: noncompliant much of the time  Exercise: noncompliant much of the time  MEDS: noncompliant much of the time   He reports he continues to have issues with esophageal spasms and is requesting a 3 times a day does of his Valium.  He has not been taking his Nexium and was unable to fill the Cardizem he was previously prescribed due to cost.  He is having no medication side effects  Acute Medical concerns: Had a fall a 2 weeks ago.  Reports injuring his right rib .  He is not having significant difficulty with breathing.  Reports pain medicine as well.  He is not coughing.  Pt denies any fevers, chills, or rigors.    Pertinent History  Medical & Surgical Hx:  Past Medical History  Diagnosis Date  . Hypertension   . Acute MI   . Chronic pain     Medications: Reviewed - See EMR Social Hx:  Social History   Occupational History  . Not on file.   Social History Main Topics  . Smoking status: Current Every Day Smoker -- 1.00 packs/day    Types: Cigarettes  . Smokeless tobacco: Never Used  . Alcohol Use: No     Comment: 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  . Drug Use: No  . Sexual Activity: No   Objective Findings:  Vitals: BP 139/87  Pulse 87  Temp(Src) 98.2 F (36.8 C) (Oral)  Ht 5\' 7"  (1.702  m)  Wt 239 lb 6.4 oz (108.591 kg)  BMI 37.49 kg/m2 PE: GENERAL:  adult obese male. In no discomfort; no respiratory distress  PSYCH:  alert and appropriate, good insight   HNEENT:    CARDIO:  RRR, S1/S2 heard, no murmur  LUNGS:  CTA B, no wheezes, no crackles; he is tenderness palpation with small amount of residual ecchymosis on the right lateral chest wall there is no palpable crepitation, he has good movement of the thoracic cage   ABDOMEN:    EXTREM:  chronic venous stasis changes, trace edema, trace pedal pulses bilaterally   GU:   SKIN:   NEUROMSK:      Assessment & Plan:  See problem associated charting

## 2013-08-23 NOTE — Assessment & Plan Note (Signed)
Will need to be addressed at next appointment given changes with hydrocodone prescriptions.

## 2013-08-23 NOTE — Assessment & Plan Note (Addendum)
Stable/Well Controlled - no changes at this time. Restarting Cardizem for esophageal spasm

## 2013-08-23 NOTE — Assessment & Plan Note (Addendum)
Start ASA, on Lipitor, declines Nitro/further eval Stop smoking

## 2013-09-02 ENCOUNTER — Telehealth: Payer: Self-pay | Admitting: *Deleted

## 2013-09-02 NOTE — Telephone Encounter (Signed)
Dr.Rigby. Please call H.D. To clarify Rx Flexeril (to take once daily?) see Rx from 07/14/13. You can leave a message for Olegario Messier at the H.D. # [phone] Thank you. Lorenda Hatchet, Renato Battles

## 2013-09-02 NOTE — Telephone Encounter (Signed)
Clarified order.  Flexeril 10mg  tid prn #30 with 3 refills

## 2013-09-13 ENCOUNTER — Other Ambulatory Visit: Payer: Self-pay | Admitting: Sports Medicine

## 2013-09-13 NOTE — Progress Notes (Signed)
Received refill request for Valium.  He is reportedly taking 3 tablets per day now.  This is not the way his medications were written and this will need to be addressed during the next office visit.  Further refills refused at this time.

## 2013-11-01 ENCOUNTER — Encounter: Payer: Self-pay | Admitting: Sports Medicine

## 2013-11-01 ENCOUNTER — Ambulatory Visit (INDEPENDENT_AMBULATORY_CARE_PROVIDER_SITE_OTHER): Payer: No Typology Code available for payment source | Admitting: Sports Medicine

## 2013-11-01 VITALS — BP 156/78 | HR 75 | Temp 98.1°F | Ht 67.0 in | Wt 240.0 lb

## 2013-11-01 DIAGNOSIS — M159 Polyosteoarthritis, unspecified: Secondary | ICD-10-CM

## 2013-11-01 DIAGNOSIS — I10 Essential (primary) hypertension: Secondary | ICD-10-CM

## 2013-11-01 DIAGNOSIS — J449 Chronic obstructive pulmonary disease, unspecified: Secondary | ICD-10-CM

## 2013-11-01 DIAGNOSIS — E7800 Pure hypercholesterolemia, unspecified: Secondary | ICD-10-CM

## 2013-11-01 DIAGNOSIS — I2510 Atherosclerotic heart disease of native coronary artery without angina pectoris: Secondary | ICD-10-CM

## 2013-11-01 DIAGNOSIS — G8929 Other chronic pain: Secondary | ICD-10-CM

## 2013-11-01 DIAGNOSIS — F17200 Nicotine dependence, unspecified, uncomplicated: Secondary | ICD-10-CM

## 2013-11-01 MED ORDER — DIAZEPAM 5 MG PO TABS: 5.0000 mg | ORAL_TABLET | Freq: Two times a day (BID) | ORAL | Status: DC | PRN

## 2013-11-01 MED ORDER — METOPROLOL SUCCINATE ER 200 MG PO TB24: 200.0000 mg | ORAL_TABLET | Freq: Every day | ORAL | Status: DC

## 2013-11-01 MED ORDER — HYDROCODONE-ACETAMINOPHEN 5-325 MG PO TABS: 2.0000 | ORAL_TABLET | ORAL | Status: DC | PRN

## 2013-11-01 MED ORDER — ALBUTEROL SULFATE HFA 108 (90 BASE) MCG/ACT IN AERS: 1.0000 | INHALATION_SPRAY | RESPIRATORY_TRACT | Status: DC | PRN

## 2013-11-01 NOTE — Patient Instructions (Addendum)
   Think about a sleep study  Follow up in 6 weeks to recheck your BP    If you need anything prior to your next visit please call the clinic. Please Bring all medications or accurate medication list with you to each appointment; an accurate medication list is essential in providing you the best care possible.

## 2013-11-01 NOTE — Progress Notes (Deleted)
  George Leon - 57 y.o. male MRN [id number]  Date of birth: [birth year]  CC, HPI, Interval History & ROS  [name] is here today to followup on his chronic medical conditions including:  Sleep, Fatigue,    He reports ***  Other acute problems include: ***  Pertinent History & Care Coordination  GOES BY "George Leon"  History  Smoking status  . Current Every Day Smoker -- 1.00 packs/day  . Types: Cigarettes  Smokeless tobacco  . Never Used   Health Maintenance Due  Topic  . Colonoscopy    No results found for this basename: HGBA1C, TRIG, CHOL, HDL, LDLCALC, TSH,  in the last 8760 hours   Otherwise past Medical, Surgical, Social, and Family History Reviewed per EMR Medications and Allergies reviewed and all updated if necessary. Objective Findings  VITALS: HR: 75 bpm  BP: 182/92 mmHg  TEMP: 98.1 F (36.7 C) (Oral)  RESP:    HT: 5\' 7"  (170.2 cm)  WT: 240 lb (108.863 kg)  BMI: 37.7   BP Readings from Last 3 Encounters:  11/01/13 182/92  08/23/13 139/87  07/14/13 127/78   Wt Readings from Last 3 Encounters:  11/01/13 240 lb (108.863 kg)  08/23/13 239 lb 6.4 oz (108.591 kg)  07/14/13 241 lb (109.317 kg)     PHYSICAL EXAM: GENERAL: {description} male. In no discomfort; no respiratory distress  PSYCH: alert and appropriate, good insight   HNEENT:   CARDIO:   LUNGS:   ABDOMEN:   EXTREM:    GU:   SKIN:     Assessment & Plan   Problems addressed today: General Plan & Pt Instructions:  No diagnosis found.    {Pt instructions & general plan}  {Additional follow up}     For further discussion of A/P and for follow up issues see problem based charting.

## 2013-11-01 NOTE — Progress Notes (Signed)
George Leon - 57 y.o. male MRN [id number]  Date of birth: 04/16/1956  CC, HPI, Interval History & ROS  George Leon is here today to followup on his chronic medical conditions including:  Hypertension, hyperlipidemia, chronic pain syndrome, tobacco abuse, CAD, PVD  He reports doing well at his baseline  Has persistent chest pain that is unchanged in nature.  He is not interested in seeing cardiology.  Continues to smoke daily.  Chronic pain medications are keeping him functional and allowing him to perform his necessary duties at home.  Requesting bilateral knee injections today.  It has been greater than 3 months and he seemed to do well for him for usually between 2 and 3 months at a time.  Patient denies any facial asymmetry, unilateral weakness, or dysarthria.  He has been out of multiple of his blood pressure medications for 3-5 days.  He does report daytime somnolence and he does snore.  Not interested in a sleep study   Pertinent History & Care Coordination  George Leon ([employer])  major active medical problems include: # CVD: HTN, HLD, Known CAD, PVD, Obesity Pt refuses to follow up with Cardiology in spite of know whatn disease # MSK: B Knee OA, S/p Cervical  Fusion with Chronic Pain syndrome, SI Joint  Chronic Opioids per [facility]  Other Pertinent Med/Surg/Hosp History: # Tobacco abuse  # Anxiety # Morbid Obesity # COPD # Low T   Follow up Issues:  Cardiology  Sleep study     History  Smoking status  . Current Every Day Smoker -- 1.00 packs/day  . Types: Cigarettes  Smokeless tobacco  . Never Used   Health Maintenance Due  Topic  . Colonoscopy    No results found for this basename: HGBA1C, TRIG, CHOL, HDL, LDLCALC, TSH,  in the last 8760 hours   Otherwise past Medical, Surgical, Social, and Family History Reviewed per EMR Medications and Allergies reviewed and all updated if necessary. Objective Findings  VITALS: HR: 75 bpm  BP:  156/78 mmHg  TEMP: 98.1 F (36.7 C) (Oral)  RESP:    HT: 5\' 7"  (170.2 cm)  WT: 240 lb (108.863 kg)  BMI: 37.7   BP Readings from Last 3 Encounters:  11/01/13 156/78  08/23/13 139/87  07/14/13 127/78   Wt Readings from Last 3 Encounters:  11/01/13 240 lb (108.863 kg)  08/23/13 239 lb 6.4 oz (108.591 kg)  07/14/13 241 lb (109.317 kg)     PHYSICAL EXAM: GENERAL:  adult obese Caucasian male. In no discomfort; no respiratory distress  PSYCH: alert and appropriate, good insight   HNEENT:  no JVD   CARDIO: RRR, S1/S2 heard, no murmur  LUNGS: CTA B, no wheezes, no crackles  ABDOMEN:  obese abdomen, no fluid wave   EXTREM:  Warm, well perfused.  Moves all 4 extremities spontaneously; no lateralization. Distal pulses 1+/4.  Trace pretibial edema. Bilateral knee exam: Crepitation with patellar grind.  There is no effusion.  Full range of motion bilaterally.  Antalgic gait   GU:   SKIN:     Assessment & Plan   Problems addressed today: General Plan & Pt Instructions:  1. COPD   2. PAIN, CHRONIC NEC   3. CAD (coronary artery disease)   4. HYPERTENSION, BENIGN   5. TOBACCO ABUSE   6. HYPERCHOLESTEROLEMIA   7. OSTEOARTHRITIS, MULTIPLE JOINTS       Think about a sleep study  Follow up in 6 weeks to recheck your BP  For further discussion of A/P and for follow up issues see problem based charting.

## 2013-11-03 NOTE — Assessment & Plan Note (Signed)
Patient has been out of blood pressure medications. Continue TLC (Therapeutic Lifestyle Changes)

## 2013-11-03 NOTE — Assessment & Plan Note (Signed)
Once again encouraged to quit 

## 2013-11-03 NOTE — Assessment & Plan Note (Signed)
Medications refilled today with multiple prescription provided.

## 2013-11-03 NOTE — Assessment & Plan Note (Signed)
Right Knee Injection: Performed today  The risks, benefits, and expected outcomes of the injection were reviewed and he wishes to undergo the above named procedure.  After an appropriate time out was taken, the R knee was prepped in a clean fashion and injected from a inferior Lateral, bent knee approach with a 25g syringe using 5cc of 1% plain Lidocaine and 40mg  of Depomedrol.  A bandaid was applied to the area.  This procedure was well tolerated and there were no complications.  Left Knee Injection: Performed today:  The risks, benefits, and expected outcomes of the injection were reviewed and he wishes to undergo the above named procedure.  After an appropriate time out was taken, the Right Knee was prepped in a clean fashion and injected from a inferior Medial, bent knee approach with a 25g syringe using 4cc of 1% plain Lidocaine and 40mg  of Depomedrol. .  A bandaid was applied to the area.  This procedure was well tolerated and there were no complications.

## 2013-11-03 NOTE — Assessment & Plan Note (Signed)
Needs fasting levels checked. > Consider titrating Lipitor

## 2013-11-03 NOTE — Assessment & Plan Note (Signed)
Continue Lipitor, patient has refills of blood pressure medicines but just needs to pick him up from the pharmacy. Continue aspirin daily. Followup with cardiology if agreeable

## 2013-11-26 ENCOUNTER — Other Ambulatory Visit: Payer: Self-pay | Admitting: Sports Medicine

## 2013-11-26 MED ORDER — ATORVASTATIN CALCIUM 10 MG PO TABS: 10.0000 mg | ORAL_TABLET | Freq: Every day | ORAL | Status: DC

## 2013-12-22 ENCOUNTER — Ambulatory Visit: Payer: No Typology Code available for payment source

## 2013-12-23 ENCOUNTER — Encounter: Payer: Self-pay | Admitting: Sports Medicine

## 2013-12-23 ENCOUNTER — Ambulatory Visit (INDEPENDENT_AMBULATORY_CARE_PROVIDER_SITE_OTHER): Payer: No Typology Code available for payment source | Admitting: Sports Medicine

## 2013-12-23 ENCOUNTER — Ambulatory Visit: Payer: No Typology Code available for payment source

## 2013-12-23 VITALS — BP 141/91 | HR 78 | Temp 99.4°F | Ht 67.0 in | Wt 242.0 lb

## 2013-12-23 DIAGNOSIS — G8929 Other chronic pain: Secondary | ICD-10-CM

## 2013-12-23 DIAGNOSIS — M159 Polyosteoarthritis, unspecified: Secondary | ICD-10-CM

## 2013-12-23 DIAGNOSIS — I10 Essential (primary) hypertension: Secondary | ICD-10-CM

## 2013-12-23 MED ORDER — QUINAPRIL HCL 40 MG PO TABS: 40.0000 mg | ORAL_TABLET | Freq: Every morning | ORAL | Status: DC

## 2013-12-23 MED ORDER — ATORVASTATIN CALCIUM 40 MG PO TABS: 40.0000 mg | ORAL_TABLET | Freq: Every day | ORAL | Status: DC

## 2013-12-23 NOTE — Patient Instructions (Signed)
Ice compression to the knee. Follow up 1 month  Increased Lipitor to 40mg   Increase Accupril to 40mg  (okay to take 2 for now)      If you need anything prior to your next visit please call the clinic. Please Bring all medications or accurate medication list with you to each appointment; an accurate medication list is essential in providing you the best care possible.

## 2013-12-23 NOTE — Progress Notes (Signed)
  George Leon - 58 y.o. male MRN [id number]  Date of birth: Apr 14, 1956  CC, HPI, Interval History & ROS  George Leon is here today to followup on his chronic medical conditions including:  Acute on Chronic Knee Pain, HTN, CAD,   He reports his left knee has been bothering him for the past month and a half since tripping over a misplaced item on the floor.  No fall but continued pain.  BP Logs Early December [id number] systolic; Early Jan [id number] consistently.  No anginal equivalent pain.    Patient denies any facial asymmetry, unilateral weakness, or dysarthria.  Not interested in returning to cardiology or in sleep study, continue smoke.  Reports his chronic pain medications are allowing him to function on a regular basis.  He denies any significant side effects from his medications.  Reports regular bowel movements.  Pertinent History & Care Coordination  George Leon ([employer])  major active medical problems include: # CVD: HTN, HLD, Known CAD, PVD, Obesity Pt refuses to follow up with Cardiology in spite of know whatn disease # MSK: B Knee OA, S/p Cervical  Fusion with Chronic Pain syndrome, SI Joint  Chronic Opioids per [facility]  Other Pertinent Med/Surg/Hosp History: # Tobacco abuse  # Anxiety # Morbid Obesity # COPD # Low T   Follow up Issues:  Cardiology  Sleep study     History  Smoking status  . Current Every Day Smoker -- 1.00 packs/day  . Types: Cigarettes  Smokeless tobacco  . Never Used   Health Maintenance Due  Topic  . Colonoscopy    No results found for this basename: HGBA1C, TRIG, CHOL, HDL, LDLCALC, TSH,  in the last 8760 hours   Otherwise past Medical, Surgical, Social, and Family History Reviewed per EMR Medications and Allergies reviewed and all updated if necessary. Objective Findings  VITALS: HR: 78 bpm  BP: 141/91 mmHg  TEMP: 99.4 F (37.4 C) (Oral)  RESP:    HT: 5\' 7"  (170.2 cm)  WT: 242 lb (109.77 kg)  BMI: 38   BP  Readings from Last 3 Encounters:  12/23/13 141/91  11/01/13 156/78  08/23/13 139/87   Wt Readings from Last 3 Encounters:  12/23/13 242 lb (109.77 kg)  11/01/13 240 lb (108.863 kg)  08/23/13 239 lb 6.4 oz (108.591 kg)     PHYSICAL EXAM: GENERAL:  appearance older than age Caucasian male. In no discomfort; no respiratory distress  PSYCH: alert and appropriate, good insight   HNEENT:   CARDIO: RRR, S1/S2 heard, no murmur  LUNGS: CTA B, no wheezes, no crackles  ABDOMEN:  protuberant,   EXTREM:   left knee Exam: Appear:  generally edematous, no patellar ballottement, mild fluid wave   Palp:  tenderness to palpation diffusely but not along the joint line   ROM:  loss of terminal extension and flexion   NV:   neurovascularly intact   Testing:      GU:   SKIN:      Assessment & Plan   Problems addressed today: General Plan & Pt Instructions:  1. PAIN, CHRONIC NEC   2. HYPERTENSION, BENIGN   3. OSTEOARTHRITIS, MULTIPLE JOINTS       Increased Lipitor to 40mg   Increase Accupril to 40mg  (okay to take 2 for now)       For further discussion of A/P and for follow up issues see problem based charting.

## 2014-01-01 ENCOUNTER — Other Ambulatory Visit: Payer: Self-pay | Admitting: Sports Medicine

## 2014-01-01 DIAGNOSIS — J449 Chronic obstructive pulmonary disease, unspecified: Secondary | ICD-10-CM

## 2014-01-01 MED ORDER — ROSUVASTATIN CALCIUM 10 MG PO TABS: 10.0000 mg | ORAL_TABLET | Freq: Every day | ORAL | Status: DC

## 2014-01-01 MED ORDER — ALBUTEROL SULFATE HFA 108 (90 BASE) MCG/ACT IN AERS: 1.0000 | INHALATION_SPRAY | RESPIRATORY_TRACT | Status: DC | PRN

## 2014-01-01 NOTE — Assessment & Plan Note (Signed)
Accupril titrated up

## 2014-01-01 NOTE — Assessment & Plan Note (Signed)
Patient with questionable trace effusion of left knee that is likely contributing to some of his pain.  Patient is aware he is early for injections but is amenable to trying a arthrocentesis although due to his body habitus is difficult to tell if he has a true effusion or obesity and synovitis.  After consent was obtained, using sterile technique the left knee was prepped and plain Lidocaine 1% was used as local anesthetic. The joint was entered from the superior lateral aspect and minimal fluid was withdrawn.  After 3 attempts, the procedure was terminated.  The procedure was well tolerated.  The patient is asked to continue to rest and use ice daily on the joint for a few more days before resuming regular activities.  It may be more painful for the first 1-2 days.  Watch for fever, or increased swelling or persistent pain in the joint. Call or return to clinic prn if such symptoms occur or there is failure to improve as anticipated.

## 2014-01-01 NOTE — Assessment & Plan Note (Signed)
Refills provided today.

## 2014-01-21 ENCOUNTER — Other Ambulatory Visit: Payer: Self-pay | Admitting: *Deleted

## 2014-01-24 MED ORDER — ESOMEPRAZOLE MAGNESIUM 40 MG PO CPDR: 40.0000 mg | DELAYED_RELEASE_CAPSULE | Freq: Every day | ORAL | Status: DC | PRN

## 2014-01-26 ENCOUNTER — Ambulatory Visit (INDEPENDENT_AMBULATORY_CARE_PROVIDER_SITE_OTHER): Payer: No Typology Code available for payment source | Admitting: Sports Medicine

## 2014-01-26 ENCOUNTER — Encounter: Payer: Self-pay | Admitting: Sports Medicine

## 2014-01-26 VITALS — BP 146/85 | HR 71 | Temp 98.1°F | Ht 67.0 in | Wt 245.7 lb

## 2014-01-26 DIAGNOSIS — G8929 Other chronic pain: Secondary | ICD-10-CM

## 2014-01-26 DIAGNOSIS — I10 Essential (primary) hypertension: Secondary | ICD-10-CM

## 2014-01-26 DIAGNOSIS — F17200 Nicotine dependence, unspecified, uncomplicated: Secondary | ICD-10-CM

## 2014-01-26 DIAGNOSIS — M159 Polyosteoarthritis, unspecified: Secondary | ICD-10-CM

## 2014-01-26 MED ORDER — HYDROCODONE-ACETAMINOPHEN 5-325 MG PO TABS: 2.0000 | ORAL_TABLET | ORAL | Status: DC | PRN

## 2014-01-26 MED ORDER — DIAZEPAM 5 MG PO TABS: 5.0000 mg | ORAL_TABLET | Freq: Two times a day (BID) | ORAL | Status: DC | PRN

## 2014-01-26 NOTE — Progress Notes (Signed)
George Leon - 58 y.o. male MRN [id number]  Date of birth: February 06, 1956  CC & Problems Addressed: General Plan & Pt Instructions:  Chief Complaint  Patient presents with   Medication Refill      Come in sooner to further discuss your other ongoing medical issues  Refills provided, injections in both knees     SUBJECTIVE:   He reports he is here for medication refills and bilateral knee injections "like always" reports he is applying for disability due to his decreased function however does not want any further referrals at this time or changes to his regimen. For further subjective including (HPI, Interval History & ROS) please see problem based charting  HISTORY: [name]  major active medical problems include: # CVD: HTN, HLD, Known CAD, PVD, Obesity Pt refuses to follow up with Cardiology in spite of know whatn disease # MSK: B Knee OA, S/p Cervical  Fusion with Chronic Pain syndrome, SI Joint  Chronic Opioids per [facility]  Other Pertinent Med/Surg/Hosp History: # Tobacco abuse  # Anxiety # Morbid Obesity # COPD # Low T   Follow up Issues:  Cardiology  Sleep study    No results found for this basename: HGBA1C, TRIG, CHOL, HDL, LDLCALC, LDLDIRECT, TSH, T3FREE, FREET4, T3TOTAL, T4TOTAL, THYROIDAB,  in the last 8760 hours Wt Readings from Last 3 Encounters:  01/26/14 245 lb 11.2 oz (111.449 kg)  12/23/13 242 lb (109.77 kg)  11/01/13 240 lb (108.863 kg)   BP Readings from Last 3 Encounters:  01/26/14 146/85  12/23/13 141/91  11/01/13 156/78    History  Smoking status   Current Every Day Smoker -- 1.00 packs/day   Types: Cigarettes  Smokeless tobacco   Never Used   Health Maintenance Due  Topic   Colonoscopy     Otherwise past Medical, Surgical, Social, and Family History Reviewed per EMR Medications and Allergies reviewed and updated per below.  VITALS: Reviewed per EMR.   PHYSICAL EXAM: GENERAL:  adult obese  Caucasian male. In no discomfort; no respiratory distress  PSYCH: alert and appropriate, poor insight, stream of thought conversation.    HNEENT:  broad-based, no JVD   CARDIO: RRR, S1/S2 heard, no murmur  LUNGS: CTA B, no wheezes, no crackles  ABDOMEN:  obese   EXTREM:  Warm, well perfused.  Moves all 4 extremities spontaneously; no lateralization.  Distal pulses 1+/4 PT B.  trace pretibial edema.Marland Kitchen  sparse lower extremity hair   GU:   SKIN:     MEDICATIONS, LABS & OTHER ORDERS: Previous Medications   ALBUTEROL (PROAIR HFA) 108 (90 BASE) MCG/ACT INHALER    Inhale 1-2 puffs into the lungs every 4 (four) hours as needed. As needed for shortness of breath /wheeze/cough   ASPIRIN EC 81 MG TABLET    Take 1 tablet (81 mg total) by mouth daily.   CYCLOBENZAPRINE (FLEXERIL) 10 MG TABLET    Take 1 tablet (10 mg total) by mouth once.   DILTIAZEM (CARDIZEM) 60 MG TABLET    Take 1 tablet (60 mg total) by mouth 3 (three) times daily.   ESOMEPRAZOLE (NEXIUM) 40 MG CAPSULE    Take 1 capsule (40 mg total) by mouth daily as needed. indigestion   FISH OIL-OMEGA-3 FATTY ACIDS 1000 MG CAPSULE    Take 1 g by mouth 2 (two) times daily.   METOPROLOL (TOPROL XL) 200 MG 24 HR TABLET    Take 1 tablet (200 mg total) by mouth daily.  QUINAPRIL (ACCUPRIL) 40 MG TABLET    Take 1 tablet (40 mg total) by mouth every morning.   ROSUVASTATIN (CRESTOR) 10 MG TABLET    Take 1 tablet (10 mg total) by mouth daily. Substitute for Lipitor   Modified Medications   Modified Medication Previous Medication   DIAZEPAM (VALIUM) 5 MG TABLET diazepam (VALIUM) 5 MG tablet      Take 1 tablet (5 mg total) by mouth every 12 (twelve) hours as needed for anxiety.    Take 1 tablet (5 mg total) by mouth every 12 (twelve) hours as needed for anxiety.   HYDROCODONE-ACETAMINOPHEN (NORCO) 5-325 MG PER TABLET HYDROcodone-acetaminophen (NORCO) 5-325 MG per tablet      Take 2 tablets by mouth every 4 (four) hours as needed for moderate pain.    Take  2 tablets by mouth every 4 (four) hours as needed for moderate pain.   HYDROCODONE-ACETAMINOPHEN (NORCO) 5-325 MG PER TABLET HYDROcodone-acetaminophen (NORCO) 5-325 MG per tablet      Take 2 tablets by mouth every 4 (four) hours as needed for moderate pain.    Take 2 tablets by mouth every 4 (four) hours as needed for moderate pain.   HYDROCODONE-ACETAMINOPHEN (NORCO/VICODIN) 5-325 MG PER TABLET HYDROcodone-acetaminophen (NORCO/VICODIN) 5-325 MG per tablet      Take 2 tablets by mouth every 4 (four) hours as needed for moderate pain. To last 30 days    Take 2 tablets by mouth every 4 (four) hours as needed for moderate pain. To last 30 days   New Prescriptions   No medications on file   Discontinued Medications   No medications on file  No orders of the defined types were placed in this encounter.    ASSESSMENT & PLAN:  See problem based charting & AVS for pt instructions.

## 2014-01-26 NOTE — Patient Instructions (Signed)
   Come in sooner to further discuss your other ongoing medical issues  Refills provided, injections in both knees   If you need anything prior to your next visit please call the clinic. Please Bring all medications or accurate medication list with you to each appointment; an accurate medication list is essential in providing you the best care possible.

## 2014-02-03 ENCOUNTER — Encounter: Payer: Self-pay | Admitting: Sports Medicine

## 2014-02-03 NOTE — Assessment & Plan Note (Signed)
Problem Based Documentation:    Subjective Report: Denies any adverse effects to pain medications including nausea, vomiting, confusion, sleepiness, fatigue, constipation. Current medication regimen is significantly improving functional status although his functional status is declining overall. He is now reporting bilateral but right worse than left weakness in his leg with ambulation.  Reproducible.  Relieved with rest.  Occasional cramping with this     Assessment & Plan & Follow up Issues:  Chronic condition from multifactorial etiologies New lower extremity pain potentially from PVD and claudication.  Patient declines any further evaluation at this time. 1. Refill chronic pain medications given improvement in function overall > Consider readdressing evaluation by pain management however patient currently declining. > Consider some aspect of opioid induced hyperalgesia.

## 2014-02-03 NOTE — Assessment & Plan Note (Signed)
Problem Based Documentation:    Subjective Report:   Pt denies chest pain, dyspnea at rest or exertion, PND, lower extremity edema.  Patient denies any facial asymmetry, unilateral weakness, or dysarthria.  TLC Compliance Diet:  poor dietary compliance   Exercise:  minimal activity, no specific exercise   MEDS:  reports good compliance       Assessment & Plan & Follow up Issues:  Chronic, moderately controlled condition Patient not receptive to make changes at this time Unwilling to followup with cardiology 1. No changes > Consider referral back to cardiology, encourage smoking cessation, increased activity, low salt diet

## 2014-02-03 NOTE — Assessment & Plan Note (Signed)
Problem Based Documentation:    Subjective Report:  1 pack per day     Assessment & Plan & Follow up Issues:  Chronic condition Pre-contemplative 1. Encouraged to quit once again, extensive counseling provided and tried to help him relate to his overall chronic medical conditions to his tobacco use

## 2014-02-03 NOTE — Assessment & Plan Note (Signed)
Bilateral Knee Injections Performed today:  The risks, benefits, and expected outcomes of the injection were reviewed and he wishes to undergo the above named procedure;  Including gradual worsening of underlying OA  After an appropriate time out was taken, the left knee was prepped in a clean fashion and injected from a middle Lateral, bent knee approach with a 21g syringe using 4cc of 1% plain Lidocaine and 40mg  of DepoMedrol.  A bandaid was applied to the area.  This procedure was well tolerated and there were no complications.  Then the right knee was prepped in a clean fashion and injected from a middle Lateral, bent approach with a 21g syringe using 4cc of 1% plain Lidocaine and 40mg  of DepoMedrol.  A bandaid was applied to the area.  This procedure was well tolerated and there were no complications.

## 2014-02-04 ENCOUNTER — Telehealth: Payer: Self-pay | Admitting: *Deleted

## 2014-02-04 NOTE — Telephone Encounter (Signed)
Received message from [facility] needing clarification for Rx written for Accupril 20mg .  Has this Accupril been increased to 40 mg tab written on 12/23/2013, [facility] has been providing the pt Accupril 20 mg tab.  Rx for Crestor 10 mg was given on 01/01/2014 and a Rx for Lipitor 40 mg given on 12/23/2013.  Would the Crestor 10 mg Rx replace the Lipitor, if not is Lipitor 40 mg the active Rx.  Please call [facility] at [phone] to clarify.  Derl Barrow, RN

## 2014-02-07 NOTE — Telephone Encounter (Signed)
Returned call to [facility] regarding medication clarification.  Please see message below from Dr. Paulla Fore.  Norva Riffle

## 2014-02-07 NOTE — Telephone Encounter (Signed)
Yes the patient is supposed to be on 40 mg of Accupril daily as written. I initially wrote for Lipitor, [facility] reported they were unable to fill this and requested a prescription for Crestor.  Hence the new prescription for Crestor.  His current medication list is accurate and is what the patient should be on.  No new prescription should be needed as the 40 mg of Accupril is what was written for last.

## 2014-03-02 ENCOUNTER — Other Ambulatory Visit: Payer: Self-pay | Admitting: *Deleted

## 2014-03-02 DIAGNOSIS — J449 Chronic obstructive pulmonary disease, unspecified: Secondary | ICD-10-CM

## 2014-03-03 MED ORDER — ALBUTEROL SULFATE HFA 108 (90 BASE) MCG/ACT IN AERS: 1.0000 | INHALATION_SPRAY | RESPIRATORY_TRACT | Status: DC | PRN

## 2014-03-10 ENCOUNTER — Telehealth: Payer: Self-pay | Admitting: *Deleted

## 2014-03-10 NOTE — Telephone Encounter (Signed)
Per [facility] they can not get Accupril for Free, would you consider changing patient to Benicar?  If so please send new Rx.  Derl Barrow, RN

## 2014-03-14 MED ORDER — OLMESARTAN MEDOXOMIL 40 MG PO TABS: 40.0000 mg | ORAL_TABLET | Freq: Every day | ORAL | Status: DC

## 2014-03-14 NOTE — Telephone Encounter (Signed)
Change made 40 = 40

## 2014-04-12 ENCOUNTER — Encounter (HOSPITAL_COMMUNITY): Payer: Self-pay | Admitting: Emergency Medicine

## 2014-04-12 ENCOUNTER — Emergency Department (HOSPITAL_COMMUNITY)
Admission: EM | Admit: 2014-04-12 | Discharge: 2014-04-12 | Disposition: A | Discharge status: Home / Self Care | Payer: No Typology Code available for payment source | Attending: Emergency Medicine | Admitting: Emergency Medicine

## 2014-04-12 ENCOUNTER — Emergency Department (HOSPITAL_COMMUNITY): Payer: No Typology Code available for payment source

## 2014-04-12 DIAGNOSIS — J4489 Other specified chronic obstructive pulmonary disease: Secondary | ICD-10-CM | POA: Insufficient documentation

## 2014-04-12 DIAGNOSIS — R05 Cough: Secondary | ICD-10-CM | POA: Insufficient documentation

## 2014-04-12 DIAGNOSIS — I252 Old myocardial infarction: Secondary | ICD-10-CM | POA: Insufficient documentation

## 2014-04-12 DIAGNOSIS — J449 Chronic obstructive pulmonary disease, unspecified: Secondary | ICD-10-CM

## 2014-04-12 DIAGNOSIS — Z888 Allergy status to other drugs, medicaments and biological substances status: Secondary | ICD-10-CM | POA: Insufficient documentation

## 2014-04-12 DIAGNOSIS — Z79899 Other long term (current) drug therapy: Secondary | ICD-10-CM | POA: Insufficient documentation

## 2014-04-12 DIAGNOSIS — Z881 Allergy status to other antibiotic agents status: Secondary | ICD-10-CM | POA: Insufficient documentation

## 2014-04-12 DIAGNOSIS — Z7982 Long term (current) use of aspirin: Secondary | ICD-10-CM | POA: Insufficient documentation

## 2014-04-12 DIAGNOSIS — Z87891 Personal history of nicotine dependence: Secondary | ICD-10-CM | POA: Insufficient documentation

## 2014-04-12 DIAGNOSIS — R059 Cough, unspecified: Secondary | ICD-10-CM | POA: Insufficient documentation

## 2014-04-12 DIAGNOSIS — Z882 Allergy status to sulfonamides status: Secondary | ICD-10-CM | POA: Insufficient documentation

## 2014-04-12 DIAGNOSIS — I10 Essential (primary) hypertension: Secondary | ICD-10-CM | POA: Insufficient documentation

## 2014-04-12 DIAGNOSIS — R062 Wheezing: Secondary | ICD-10-CM | POA: Insufficient documentation

## 2014-04-12 HISTORY — DX: Chronic obstructive pulmonary disease, unspecified: J44.9

## 2014-04-12 LAB — CBC
HEMATOCRIT: 42.8 % (ref 39.0–52.0)
HEMOGLOBIN: 14.5 g/dL (ref 13.0–17.0)
MCH: 31.2 pg (ref 26.0–34.0)
MCHC: 33.9 g/dL (ref 30.0–36.0)
MCV: 92 fL (ref 78.0–100.0)
Platelets: 199 10*3/uL (ref 150–400)
RBC: 4.65 MIL/uL (ref 4.22–5.81)
RDW: 14 % (ref 11.5–15.5)
WBC: 15.4 10*3/uL — ABNORMAL HIGH (ref 4.0–10.5)

## 2014-04-12 LAB — BASIC METABOLIC PANEL
BUN: 5 mg/dL — ABNORMAL LOW (ref 6–23)
CALCIUM: 8.9 mg/dL (ref 8.4–10.5)
CO2: 26 mEq/L (ref 19–32)
Chloride: 99 mEq/L (ref 96–112)
Creatinine, Ser: 0.71 mg/dL (ref 0.50–1.35)
GFR calc Af Amer: >90 mL/min (ref >90)
GLUCOSE: 97 mg/dL (ref 70–99)
Potassium: 3.9 mEq/L (ref 3.7–5.3)
Sodium: 137 mEq/L (ref 137–147)

## 2014-04-12 LAB — I-STAT TROPONIN, ED: Troponin i, poc: 0.01 ng/mL (ref 0.00–0.08)

## 2014-04-12 LAB — PRO B NATRIURETIC PEPTIDE: Pro B Natriuretic peptide (BNP): 120.9 pg/mL (ref 0–125)

## 2014-04-12 MED ORDER — IPRATROPIUM BROMIDE 0.02 % IN SOLN
0.5000 mg | Freq: Once | RESPIRATORY_TRACT | Status: AC
  Administered 2014-04-12: 0.5 mg via RESPIRATORY_TRACT
  Filled 2014-04-12: qty 2.5

## 2014-04-12 MED ORDER — MONTELUKAST SODIUM 4 MG PO CHEW: 4.0000 mg | CHEWABLE_TABLET | Freq: Every day | ORAL | Status: DC

## 2014-04-12 MED ORDER — HYDROCOD POLST-CHLORPHEN POLST 10-8 MG/5ML PO LQCR
5.0000 mL | Freq: Once | ORAL | Status: AC
  Administered 2014-04-12: 5 mL via ORAL
  Filled 2014-04-12: qty 5

## 2014-04-12 MED ORDER — AZITHROMYCIN 250 MG PO TABS: 250.0000 mg | ORAL_TABLET | Freq: Every day | ORAL | Status: DC

## 2014-04-12 MED ORDER — HYDROCOD POLST-CHLORPHEN POLST 10-8 MG/5ML PO LQCR: 5.0000 mL | Freq: Two times a day (BID) | ORAL | Status: DC

## 2014-04-12 MED ORDER — ALBUTEROL SULFATE (2.5 MG/3ML) 0.083% IN NEBU
2.5000 mg | INHALATION_SOLUTION | Freq: Once | RESPIRATORY_TRACT | Status: AC
  Administered 2014-04-12: 2.5 mg via RESPIRATORY_TRACT
  Filled 2014-04-12: qty 3

## 2014-04-12 MED ORDER — PREDNISONE 10 MG PO TABS: 20.0000 mg | ORAL_TABLET | Freq: Every day | ORAL | Status: DC

## 2014-04-12 MED ORDER — PREDNISONE 20 MG PO TABS
60.0000 mg | ORAL_TABLET | Freq: Once | ORAL | Status: AC
  Administered 2014-04-12: 60 mg via ORAL
  Filled 2014-04-12: qty 3

## 2014-04-12 NOTE — Discharge Instructions (Signed)
Chronic Obstructive Pulmonary Disease  Chronic obstructive pulmonary disease (COPD) is a common lung condition in which airflow from the lungs is limited. COPD is a general term that can be used to describe many different lung problems that limit airflow, including both chronic bronchitis and emphysema.  If you have COPD, your lung function will probably never return to normal, but there are measures you can take to improve lung function and make yourself feel better.   CAUSES   · Smoking (common).    · Exposure to secondhand smoke.    · Genetic problems.  · Chronic inflammatory lung diseases or recurrent infections.  SYMPTOMS   · Shortness of breath, especially with physical activity.    · Deep, persistent (chronic) cough with a large amount of thick mucus.    · Wheezing.    · Rapid breaths (tachypnea).    · Gray or bluish discoloration (cyanosis) of the skin, especially in fingers, toes, or lips.    · Fatigue.    · Weight loss.    · Frequent infections or episodes when breathing symptoms become much worse (exacerbations).    · Chest tightness.  DIAGNOSIS   Your healthcare provider will take a medical history and perform a physical examination to make the initial diagnosis.  Additional tests for COPD may include:   · Lung (pulmonary) function tests.  · Chest X-ray.  · CT scan.  · Blood tests.  TREATMENT   Treatment available to help you feel better when you have COPD include:   · Inhaler and nebulizer medicines. These help manage the symptoms of COPD and make your breathing more comfortable  · Supplemental oxygen. Supplemental oxygen is only helpful if you have a low oxygen level in your blood.    · Exercise and physical activity. These are beneficial for nearly all people with COPD. Some people may also benefit from a pulmonary rehabilitation program.  HOME CARE INSTRUCTIONS   · Take all medicines (inhaled or pills) as directed by your health care provider.  · Only take over-the-counter or prescription medicines  for pain, fever, or discomfort as directed by your health care provider.    · Avoid over-the-counter medicines or cough syrups that dry up your airway (such as antihistamines) and slow down the elimination of secretions unless instructed otherwise by your healthcare provider.    · If you are a smoker, the most important thing that you can do is stop smoking. Continuing to smoke will cause further lung damage and breathing trouble. Ask your health care provider for help with quitting smoking. He or she can direct you to community resources or hospitals that provide support.  · Avoid exposure to irritants such as smoke, chemicals, and fumes that aggravate your breathing.  · Use oxygen therapy and pulmonary rehabilitation if directed by your health care provider. If you require home oxygen therapy, ask your healthcare provider whether you should purchase a pulse oximeter to measure your oxygen level at home.    · Avoid contact with individuals who have a contagious illness.  · Avoid extreme temperature and humidity changes.  · Eat healthy foods. Eating smaller, more frequent meals and resting before meals may help you maintain your strength.  · Stay active, but balance activity with periods of rest. Exercise and physical activity will help you maintain your ability to do things you want to do.  · Preventing infection and hospitalization is very important when you have COPD. Make sure to receive all the vaccines your health care provider recommends, especially the pneumococcal and influenza vaccines. Ask your healthcare provider whether you   need a pneumonia vaccine.  · Learn and use relaxation techniques to manage stress.  · Learn and use controlled breathing techniques as directed by your health care provider. Controlled breathing techniques include:    · Pursed lip breathing. Start by breathing in (inhaling) through your nose for 1 second. Then, purse your lips as if you were going to whistle and breathe out (exhale)  through the pursed lips for 2 seconds.    · Diaphragmatic breathing. Start by putting one hand on your abdomen just above your waist. Inhale slowly through your nose. The hand on your abdomen should move out. Then purse your lips and exhale slowly. You should be able to feel the hand on your abdomen moving in as you exhale.    · Learn and use controlled coughing to clear mucus from your lungs. Controlled coughing is a series of short, progressive coughs. The steps of controlled coughing are:    1. Lean your head slightly forward.    2. Breathe in deeply using diaphragmatic breathing.    3. Try to hold your breath for 3 seconds.    4. Keep your mouth slightly open while coughing twice.    5. Spit any mucus out into a tissue.    6. Rest and repeat the steps once or twice as needed.  SEEK MEDICAL CARE IF:   · You are coughing up more mucus than usual.    · There is a change in the color or thickness of your mucus.    · Your breathing is more labored than usual.    · Your breathing is faster than usual.    SEEK IMMEDIATE MEDICAL CARE IF:   · You have shortness of breath while you are resting.    · You have shortness of breath that prevents you from:  · Being able to talk.    · Performing your usual physical activities.    · You have chest pain lasting longer than 5 minutes.    · Your skin color is more cyanotic than usual.  · You measure low oxygen saturations for longer than 5 minutes with a pulse oximeter.  MAKE SURE YOU:   · Understand these instructions.  · Will watch your condition.  · Will get help right away if you are not doing well or get worse.  Document Released: 09/04/2005 Document Revised: 09/15/2013 Document Reviewed: 07/22/2013  [employer]® Patient Information ©2014 [employer].

## 2014-04-12 NOTE — ED Notes (Signed)
Pt reports for 4 weeks having SOB a/w cough with clear phelgm, pt reports slight CP; family member reports that pt looked like he was going to pass out 30 minutes ago

## 2014-04-19 NOTE — ED Provider Notes (Addendum)
CSN: [id number]     Arrival date & time 04/12/14  1933 History   First MD Initiated Contact with Patient 04/12/14 2227     Chief Complaint  Patient presents with  . Shortness of Breath      HPI  Patient c/o SOB for several days.  Coughing, clear phlegm.  DOE, no PND, orthopnea, CP. Edema.  No fever. Past Medical History  Diagnosis Date  . Hypertension   . Acute MI   . Chronic pain   . COPD (chronic obstructive pulmonary disease)    Past Surgical History  Procedure Laterality Date  . Joint replacement    . Back surgery    . Cervical spine surgery  1988   History reviewed. No pertinent family history. History  Substance Use Topics  . Smoking status: Former Smoker -- 1.00 packs/day    Types: Cigarettes    Quit date: 03/22/2014  . Smokeless tobacco: Never Used  . 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  HENT: Negative for mouth sores, sore throat and trouble swallowing.   Eyes: Negative for visual disturbance.  Respiratory: Positive for cough and shortness of breath. Negative for chest tightness and wheezing.   Cardiovascular: Negative for chest pain.  Gastrointestinal: Negative for nausea, vomiting, abdominal pain, diarrhea and abdominal distention.  Endocrine: Negative for polydipsia, polyphagia and polyuria.  Genitourinary: Negative for dysuria, frequency and hematuria.  Musculoskeletal: Negative for gait problem.  Skin: Negative for color change, pallor and rash.  Neurological: Negative for dizziness, syncope, light-headedness and headaches.  Hematological: Does not bruise/bleed easily.  Psychiatric/Behavioral: Negative for behavioral problems and confusion.      Allergies  Adhesive; Augmentin; Furosemide; Hydrochlorothiazide; and Sulfamethoxazole  Home Medications   Prior to Admission medications   Medication Sig Start  Date End Date Taking? Authorizing Provider  albuterol (PROAIR HFA) 108 (90 BASE) MCG/ACT inhaler Inhale 1-2 puffs into the lungs every 4 (four) hours as needed. As needed for shortness of breath /wheeze/cough   Yes Gerda Diss, DO  aspirin EC 81 MG tablet Take 1 tablet (81 mg total) by mouth daily. 08/23/13  Yes Gerda Diss, DO  dextromethorphan (DELSYM) 30 MG/5ML liquid Take 30 mg by mouth as needed for cough.   Yes Historical Provider, MD  diazepam (VALIUM) 5 MG tablet Take 1 tablet (5 mg total) by mouth every 12 (twelve) hours as needed for anxiety. 01/26/14  Yes Gerda Diss, DO  diltiazem (TAZTIA XT) 180 MG 24 hr capsule Take 180 mg by mouth daily.   Yes Historical Provider, MD  esomeprazole (NEXIUM) 40 MG capsule Take 1 capsule (40 mg total) by mouth daily as needed. indigestion   Yes Gerda Diss, DO  fish oil-omega-3 fatty acids 1000 MG capsule Take 1 g by mouth 2 (two) times daily.   Yes Historical Provider, MD  HYDROcodone-acetaminophen (NORCO) 5-325 MG per tablet Take 2 tablets by mouth every 4 (four) hours as needed for moderate pain. 01/26/14  Yes Gerda Diss, DO  metoprolol (TOPROL XL) 200 MG 24 hr tablet Take 1 tablet (200 mg total) by mouth daily. 11/01/13  Yes Gerda Diss, DO  quinapril (ACCUPRIL) 40 MG tablet Take 40 mg by mouth daily.   Yes Historical Provider, MD  rosuvastatin (CRESTOR) 10 MG tablet Take 1 tablet (10 mg total) by mouth daily.  Substitute for Lipitor 01/01/14  Yes Gerda Diss, DO  azithromycin (ZITHROMAX Z-PAK) 250 MG tablet Take 1 tablet (250 mg total) by mouth daily. As directed 04/12/14   Tanna Furry, MD  chlorpheniramine-HYDROcodone [facility]) 10-8 MG/5ML LQCR Take 5 mLs by mouth every 12 (twelve) hours. 04/12/14   Tanna Furry, MD  montelukast (SINGULAIR) 4 MG chewable tablet Chew 1 tablet (4 mg total) by mouth at bedtime. 04/12/14   Tanna Furry, MD  predniSONE (DELTASONE) 10 MG tablet Take 2 tablets (20 mg total) by mouth daily.  04/12/14   Tanna Furry, MD   BP 115/75  Pulse 65  Temp(Src) 98.9 F (37.2 C) (Oral)  Resp 19  Ht 5\' 7"  (1.702 m)  Wt 246 lb (111.585 kg)  BMI 38.52 kg/m2  SpO2 91% Physical Exam  Constitutional: He is oriented to person, place, and time. He appears well-developed and well-nourished. No distress.  HENT:  Head: Normocephalic.  Eyes: Conjunctivae are normal. Pupils are equal, round, and reactive to light. No scleral icterus.  Neck: Normal range of motion. Neck supple. No thyromegaly present.  Cardiovascular: Normal rate and regular rhythm.  Exam reveals no gallop and no friction rub.   No murmur heard. Pulmonary/Chest: Effort normal. No respiratory distress. He has wheezes. He has no rales.  Abdominal: Soft. Bowel sounds are normal. He exhibits no distension. There is no tenderness. There is no rebound.  Musculoskeletal: Normal range of motion.  Neurological: He is alert and oriented to person, place, and time.  Skin: Skin is warm and dry. No rash noted.  Psychiatric: He has a normal mood and affect. His behavior is normal.    ED Course  Procedures (including critical care time) Labs Review Labs Reviewed  CBC - Abnormal; Notable for the following:    WBC 15.4 (*)    All other components within normal limits  BASIC METABOLIC PANEL - Abnormal; Notable for the following:    BUN 5 (*)    All other components within normal limits  PRO B NATRIURETIC PEPTIDE  I-STAT TROPOININ, ED    Imaging Review No results found.    MDM   Final diagnoses:  COPD (chronic obstructive pulmonary disease)    No acute findings.  Pt not hypoxemic.  Plan is out patient tx.    Tanna Furry, MD 04/19/14 1940  Tanna Furry, MD 04/30/14 [id number]

## 2014-04-22 ENCOUNTER — Ambulatory Visit (INDEPENDENT_AMBULATORY_CARE_PROVIDER_SITE_OTHER): Payer: No Typology Code available for payment source | Admitting: Sports Medicine

## 2014-04-22 ENCOUNTER — Encounter: Payer: Self-pay | Admitting: Sports Medicine

## 2014-04-22 VITALS — BP 136/72 | HR 63 | Temp 98.0°F | Ht 67.0 in | Wt 239.0 lb

## 2014-04-22 DIAGNOSIS — F17200 Nicotine dependence, unspecified, uncomplicated: Secondary | ICD-10-CM

## 2014-04-22 DIAGNOSIS — M25561 Pain in right knee: Secondary | ICD-10-CM

## 2014-04-22 DIAGNOSIS — M25569 Pain in unspecified knee: Secondary | ICD-10-CM

## 2014-04-22 DIAGNOSIS — M159 Polyosteoarthritis, unspecified: Secondary | ICD-10-CM

## 2014-04-22 DIAGNOSIS — L600 Ingrowing nail: Secondary | ICD-10-CM | POA: Insufficient documentation

## 2014-04-22 DIAGNOSIS — K219 Gastro-esophageal reflux disease without esophagitis: Secondary | ICD-10-CM

## 2014-04-22 DIAGNOSIS — M25562 Pain in left knee: Secondary | ICD-10-CM

## 2014-04-22 DIAGNOSIS — J449 Chronic obstructive pulmonary disease, unspecified: Secondary | ICD-10-CM

## 2014-04-22 DIAGNOSIS — G8929 Other chronic pain: Secondary | ICD-10-CM

## 2014-04-22 MED ORDER — HYDROCODONE-ACETAMINOPHEN 5-325 MG PO TABS: 2.0000 | ORAL_TABLET | ORAL | Status: DC | PRN

## 2014-04-22 MED ORDER — DIAZEPAM 5 MG PO TABS: 5.0000 mg | ORAL_TABLET | Freq: Two times a day (BID) | ORAL | Status: DC | PRN

## 2014-04-22 MED ORDER — BUDESONIDE-FORMOTEROL FUMARATE 160-4.5 MCG/ACT IN AERO: 2.0000 | INHALATION_SPRAY | Freq: Two times a day (BID) | RESPIRATORY_TRACT | Status: DC

## 2014-04-22 NOTE — Assessment & Plan Note (Signed)
Recurrence of prior chronic condition - Underwent previous medial wedge resection of however evidence of regrowth at the base > Schedule procedure visit for curettage with phenol ablation

## 2014-04-22 NOTE — Patient Instructions (Signed)
Restart Symbicort.  I have gotten you a new prescription. QUIT SMOKING, i'm glad you picked a quit date.  Follow up in the next 1-2 weeks for a PROCEDURE VISIT to have your toenail addressed.  BRING ALL OF YOUR MEDICATIONS WITH YOU TO EACH VISIT

## 2014-04-22 NOTE — Assessment & Plan Note (Signed)
Reports having a quit date. Encouraged to follow through

## 2014-04-22 NOTE — Assessment & Plan Note (Addendum)
Problem Based Documentation:    Subjective Report:  Had a significant flare about 10 days ago was unable to leave the medicines that has resolved spontaneously.  Using albuterol on a regular basis.  No significant dyspnea on exertion     Assessment & Plan & Follow up Issues:  Stable condition, recent flare, unable to fill meds but has improved 1. Restart Symbicort as has been off of this 2. Encouraged to quit smoking > Follow up medication regimen at f/u visit. Marland Kitchen

## 2014-04-22 NOTE — Assessment & Plan Note (Addendum)
Chronic condition, responding well to CSI injections q3 months 1. B inj today, >Plan to repeat in 3 months  R knee Injection Performed today:  The risks, benefits, and expected outcomes of the injection were reviewed and he wishes to undergo the above named procedure.  After an appropriate time out was taken, the Right Knee was prepped in a clean fashion and injected from a middle Lateral, bent knee approach with a 25g syringe using 4cc of 1% plain Lidocaine and 40mg  of depomedrol.  A bandaid was applied to the area.  This procedure was well tolerated and there were no complications.  Left Knee Injection Performed today:  The risks, benefits, and expected outcomes of the injection were reviewed and he wishes to undergo the above named procedure.  After an appropriate time out was taken, the Left knee was prepped in a clean fashion and injected from a middle Medial, bent knee approach with a 25g syringe using 4cc of 1% plain Lidocaine and 40mg  of Depomedrol. .  A bandaid was applied to the area.  This procedure was well tolerated and there were no complications.

## 2014-04-22 NOTE — Assessment & Plan Note (Signed)
Problem Based Documentation:    Subjective Report: Denies any adverse effects to pain medications including nausea, vomiting, confusion, sleepiness, fatigue, constipation. Current medication regimen is significantly improving functional status.     Assessment & Plan & Follow up Issues:  Chronic condition 1. Refill meds X 3 months > Have pt sign new pain contract with new PCP at f/u appointment in 3 months. > Consider random pill count and UDS after establishing with new provider

## 2014-04-22 NOTE — Progress Notes (Signed)
George Leon - 58 y.o. male MRN [id number]  Date of birth: 1956-07-18  CC, SUBJECTIVE & ROS:     If applicable, see problem based charting for additional problem specific documentation. HPI Comments: Patient presents with:   Knee Pain - both, chronic wanting injections and refills; 3 months since last; no new symptoms   COPD - recent flare, does not have meds with him   Toe Pain - left 1st ingrown reoccuring; occasionally flares  HISTORY: Past Medical, Surgical, Social, and Family History Reviewed & Updated per EMR.  Pertinent Historical Findings include: George Leon's (Goes by [name])  major active medical problems include: # CVD: HTN, HLD, Known CAD, PVD, Obesity Pt refuses to follow up with Cardiology in spite of know whatn disease # MSK: B Knee OA, S/p Cervical  Fusion with Chronic Pain syndrome, SI Joint  Chronic Opioids per [facility]  Other Pertinent Med/Surg/Hosp History: # Tobacco abuse  # Anxiety # Morbid Obesity # COPD # Low T   Follow up Issues:  Cardiology  Sleep study     OBJECTIVE:  BP:136/72 mmHg HR:63bpm TEMP:98 F (36.7 C)(Oral) RESP:  HT:5\' 7"  (170.2 cm)  WT:239 lb (108.41 kg) BMI:37.5 Physical Exam  Constitutional: He is well-developed, well-nourished, and in no distress. No distress.  HENT:  Head: Normocephalic and atraumatic.  Eyes: Right eye exhibits no discharge. Left eye exhibits no discharge. No scleral icterus.  Neck: No JVD present. No tracheal deviation present. No thyromegaly present.  Cardiovascular: Normal rate, regular rhythm and normal heart sounds.  Exam reveals no friction rub.   No murmur heard. Pulmonary/Chest: Effort normal. No respiratory distress. He has no wheezes. He has no rales.  Abdominal: He exhibits distension. There is no tenderness. There is no rebound.  Musculoskeletal:       Feet:  Bilateral knees with evidence of osteoarthritic changes.  There is limited in terminal extension.  No significant effusion, no  skin breakdown, no surrounding erythema.  Skin: He is not diaphoretic.  Psychiatric: Mood, memory, affect and judgment normal.    MEDICATIONS, LABS & OTHER ORDERS: Previous Medications   ALBUTEROL (PROAIR HFA) 108 (90 BASE) MCG/ACT INHALER    Inhale 1-2 puffs into the lungs every 4 (four) hours as needed. As needed for shortness of breath /wheeze/cough   ASPIRIN EC 81 MG TABLET    Take 1 tablet (81 mg total) by mouth daily.   DILTIAZEM (TAZTIA XT) 180 MG 24 HR CAPSULE    Take 180 mg by mouth daily.   ESOMEPRAZOLE (NEXIUM) 40 MG CAPSULE    Take 1 capsule (40 mg total) by mouth daily as needed. indigestion   FISH OIL-OMEGA-3 FATTY ACIDS 1000 MG CAPSULE    Take 1 g by mouth 2 (two) times daily.   METOPROLOL (TOPROL XL) 200 MG 24 HR TABLET    Take 1 tablet (200 mg total) by mouth daily.   MONTELUKAST (SINGULAIR) 4 MG CHEWABLE TABLET    Chew 1 tablet (4 mg total) by mouth at bedtime.   QUINAPRIL (ACCUPRIL) 40 MG TABLET    Take 40 mg by mouth daily.   ROSUVASTATIN (CRESTOR) 10 MG TABLET    Take 1 tablet (10 mg total) by mouth daily. Substitute for Lipitor   Modified Medications   Modified Medication Previous Medication   DIAZEPAM (VALIUM) 5 MG TABLET diazepam (VALIUM) 5 MG tablet      Take 1 tablet (5 mg total) by mouth every 12 (twelve) hours as needed for anxiety.  Take 1 tablet (5 mg total) by mouth every 12 (twelve) hours as needed for anxiety.   HYDROCODONE-ACETAMINOPHEN (NORCO) 5-325 MG PER TABLET HYDROcodone-acetaminophen (NORCO) 5-325 MG per tablet      Take 2 tablets by mouth every 4 (four) hours as needed for moderate pain.    Take 2 tablets by mouth every 4 (four) hours as needed for moderate pain.   HYDROCODONE-ACETAMINOPHEN (NORCO) 5-325 MG PER TABLET HYDROcodone-acetaminophen (NORCO) 5-325 MG per tablet      Take 2 tablets by mouth every 4 (four) hours as needed for moderate pain.    Take 2 tablets by mouth every 4 (four) hours as needed for moderate pain.    HYDROCODONE-ACETAMINOPHEN (NORCO/VICODIN) 5-325 MG PER TABLET HYDROcodone-acetaminophen (NORCO/VICODIN) 5-325 MG per tablet      Take 2 tablets by mouth every 4 (four) hours as needed for moderate pain.    Take 2 tablets by mouth every 4 (four) hours as needed for moderate pain. To last 30 days   New Prescriptions   BUDESONIDE-FORMOTEROL (SYMBICORT) 160-4.5 MCG/ACT INHALER    Inhale 2 puffs into the lungs 2 (two) times daily.   Discontinued Medications   AZITHROMYCIN (ZITHROMAX Z-PAK) 250 MG TABLET    Take 1 tablet (250 mg total) by mouth daily. As directed   CHLORPHENIRAMINE-HYDROCODONE (TUSSIONEX PENNKINETIC ER) 10-8 MG/5ML LQCR    Take 5 mLs by mouth every 12 (twelve) hours.   DEXTROMETHORPHAN (DELSYM) 30 MG/5ML LIQUID    Take 30 mg by mouth as needed for cough.   PREDNISONE (DELTASONE) 10 MG TABLET    Take 2 tablets (20 mg total) by mouth daily.  No orders of the defined types were placed in this encounter.   ASSESSMENT & PLAN: See problem based charting & AVS for pt instructions.

## 2014-04-25 MED ORDER — METHYLPREDNISOLONE ACETATE 80 MG/ML IJ SUSP: 80.0000 mg | Freq: Once | INTRAMUSCULAR | Status: DC

## 2014-04-25 MED ORDER — METHYLPREDNISOLONE ACETATE 40 MG/ML IJ SUSP
80.0000 mg | Freq: Once | INTRAMUSCULAR | Status: AC
  Administered 2014-04-25: 80 mg via INTRA_ARTICULAR

## 2014-04-25 NOTE — Addendum Note (Signed)
Addended by: Corinna Capra on: 04/25/2014 11:59 AM   Modules accepted: Orders

## 2014-05-09 ENCOUNTER — Other Ambulatory Visit: Payer: Self-pay | Admitting: *Deleted

## 2014-05-09 DIAGNOSIS — J449 Chronic obstructive pulmonary disease, unspecified: Secondary | ICD-10-CM

## 2014-05-10 ENCOUNTER — Ambulatory Visit (INDEPENDENT_AMBULATORY_CARE_PROVIDER_SITE_OTHER): Payer: No Typology Code available for payment source | Admitting: Sports Medicine

## 2014-05-10 ENCOUNTER — Encounter: Payer: Self-pay | Admitting: Sports Medicine

## 2014-05-10 VITALS — BP 148/82 | HR 68 | Temp 98.2°F | Ht 67.0 in | Wt 238.0 lb

## 2014-05-10 DIAGNOSIS — L600 Ingrowing nail: Secondary | ICD-10-CM

## 2014-05-10 NOTE — Progress Notes (Signed)
  George Leon - 58 y.o. male MRN [id number]  Date of birth: 1956-07-19  CC, SUBJECTIVE & ROS:     If applicable, see problem based charting for additional problem specific documentation. Chief Complaint  Patient presents with  . Nail Problem    left big toenail removal   Patient presents for left first toenail removal due to persistent pain, dystrophic changes and occasional erythema.  This has been present for quite some time he wishes to undergo the above.   HISTORY: Past Medical, Surgical, Social, and Family History Reviewed & Updated per EMR.  Pertinent Historical Findings include: Patient is well known to me  OBJECTIVE:  VS: BP:148/82 mmHg  HR:68bpm  TEMP:98.2 F (36.8 C)(Oral)  RESP:   HT:5\' 7"  (170.2 cm)   WT:238 lb (107.956 kg)  BMI:37.4 PHYSICAL EXAM:  GENERAL:  adult obese Caucasian male. In no discomfort; no respiratory distress  PSYCH: alert and appropriate, good insight   Left foot Exam: Appear:  normal-appearing foot with dystrophic and cracked left first toenail.  The medial aspect is jagged with ingrowth.    Palp:  tenderness to palpation over medial border and lateral border   NV:   capillary refill is less than 2 seconds before and after procedure    ASSESSMENT & PLAN: See problem based charting & AVS for pt instructions.

## 2014-05-10 NOTE — Assessment & Plan Note (Signed)
Chronic condition  - patient is wishing for this to be removed.  Procedure Note: Left greater toenail removal without ablation Dx: Ingrown left greater toenail  Patient given informed consent, signed copy in the chart. Appropriate time out taken.   We did discuss the options for ablation versus not and were initially going to ablate the bed however phenol was unavailable due to being passed the expiration and the patient decided to go ahead with the procedure knowing this may need to be repeated again if when the toenail grows back it continues to cause him problems.  He elected to do this instead of waiting to return when phenol is available.  Rubber band tourniquet was applied to the left greater toe.  The toe was then prepped in clean fashion and 2 cc of 2% Xylocaine was used to provide anesthesia via proximal digital block.  After adequate anesthesia the toe was prepped in sterile fashion with Betadine and fenestrated drape.  Using a nail bed elevator the toenail was lifted and removed easily with gentle traction using a needle driver.  Hemostasis was obtained using direct pressure after the rubber band tourniquet was removed.  Xeroform gauze was applied and the toe was wrapped with Kling wrap.  Capillary refill was less than 2 seconds on the distal toe.     No complications.  10 cc of estimated blood loss.    Pt tolerated the procedure well without complications and was provided instructions for discharge.

## 2014-05-11 MED ORDER — ALBUTEROL SULFATE HFA 108 (90 BASE) MCG/ACT IN AERS: 1.0000 | INHALATION_SPRAY | RESPIRATORY_TRACT | Status: DC | PRN

## 2014-06-15 ENCOUNTER — Other Ambulatory Visit: Payer: Self-pay | Admitting: *Deleted

## 2014-06-15 DIAGNOSIS — J449 Chronic obstructive pulmonary disease, unspecified: Secondary | ICD-10-CM

## 2014-06-15 MED ORDER — ALBUTEROL SULFATE HFA 108 (90 BASE) MCG/ACT IN AERS: 1.0000 | INHALATION_SPRAY | RESPIRATORY_TRACT | Status: DC | PRN

## 2014-06-21 ENCOUNTER — Other Ambulatory Visit: Payer: Self-pay | Admitting: *Deleted

## 2014-06-21 DIAGNOSIS — J449 Chronic obstructive pulmonary disease, unspecified: Secondary | ICD-10-CM

## 2014-06-22 MED ORDER — ALBUTEROL SULFATE HFA 108 (90 BASE) MCG/ACT IN AERS: 1.0000 | INHALATION_SPRAY | RESPIRATORY_TRACT | Status: DC | PRN

## 2014-07-19 ENCOUNTER — Telehealth: Payer: Self-pay | Admitting: Family Medicine

## 2014-07-19 NOTE — Telephone Encounter (Signed)
Need refill on Metoprolol and Diltiazem sent to [facility]

## 2014-07-22 MED ORDER — METOPROLOL SUCCINATE ER 200 MG PO TB24: 200.0000 mg | ORAL_TABLET | Freq: Every day | ORAL | Status: DC

## 2014-07-22 MED ORDER — DILTIAZEM HCL ER BEADS 180 MG PO CP24: 180.0000 mg | ORAL_CAPSULE | Freq: Every day | ORAL | Status: DC

## 2014-07-22 NOTE — Telephone Encounter (Signed)
Spoke with patient and informed him of below 

## 2014-07-22 NOTE — Telephone Encounter (Signed)
Printed these and faxed. Thanks.

## 2014-07-25 ENCOUNTER — Ambulatory Visit: Payer: No Typology Code available for payment source

## 2014-09-07 ENCOUNTER — Ambulatory Visit (INDEPENDENT_AMBULATORY_CARE_PROVIDER_SITE_OTHER): Payer: No Typology Code available for payment source | Admitting: Family Medicine

## 2014-09-07 ENCOUNTER — Encounter: Payer: Self-pay | Admitting: Family Medicine

## 2014-09-07 VITALS — BP 175/111 | Temp 95.1°F | Wt 245.1 lb

## 2014-09-07 DIAGNOSIS — F17200 Nicotine dependence, unspecified, uncomplicated: Secondary | ICD-10-CM

## 2014-09-07 DIAGNOSIS — Z5181 Encounter for therapeutic drug level monitoring: Secondary | ICD-10-CM

## 2014-09-07 DIAGNOSIS — F411 Generalized anxiety disorder: Secondary | ICD-10-CM

## 2014-09-07 DIAGNOSIS — I10 Essential (primary) hypertension: Secondary | ICD-10-CM

## 2014-09-07 DIAGNOSIS — G8929 Other chronic pain: Secondary | ICD-10-CM

## 2014-09-07 DIAGNOSIS — J449 Chronic obstructive pulmonary disease, unspecified: Secondary | ICD-10-CM

## 2014-09-07 DIAGNOSIS — M159 Polyosteoarthritis, unspecified: Secondary | ICD-10-CM

## 2014-09-07 MED ORDER — HYDROCODONE-ACETAMINOPHEN 5-325 MG PO TABS: 2.0000 | ORAL_TABLET | ORAL | Status: DC | PRN

## 2014-09-07 MED ORDER — DIAZEPAM 5 MG PO TABS: 5.0000 mg | ORAL_TABLET | Freq: Two times a day (BID) | ORAL | Status: DC | PRN

## 2014-09-07 NOTE — Patient Instructions (Signed)
It was good to meet you! - I have refilled valium and hydrocodone for the next 3 months - Keep taking your blood pressure medicine and check it at home every time you take your Crestor (WRITE IT DOWN so we can go over it) - Make an appointment for knee injections and blood pressure as soon as you can.   - Dr. Bonner Puna

## 2014-09-07 NOTE — Progress Notes (Signed)
Patient ID: YOAN SALLADE, male   DOB: Jul 02, 1956, 58 y.o.   MRN: [id number]   Subjective:   JAKARRI LESKO is a 58 y.o. male with a history of chronic pain, osteoarthritis, anxiety here for pain management.  He complains of severe chronic knee, back, and neck pain which is stable and well controlled on hydrocodone and receiving knee injections every 3 months. He has been out of pain medications for 2 weeks because he needs to reapply for the orange card and has not done so yet. Pain radiates down his arms from his neck and is nonradiating at his knees.   Pain often impairs ability to sleep, concentrate, and exercise.   He did take his medications today but reports extreme pain at time of BP reading.   Review of Systems:  Denies HA, dizziness, vision or hearing changes, CP, SOB, abd pain, N/V/D, constipation, urinary problems, rashes, swollen joints   Medications: reviewed and updated  SH:  Lives with wife and has extreme anxiety at times which manifests as esophageal spasm. 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 No formal treatment.  Smoking history: Quit April 2015 Objective:  Physical Exam: BP 175/111  Temp(Src) 95.1 F (35.1 C) (Oral)  Wt 245 lb 1.6 oz (111.177 kg)  Gen: Obese, interactive 58 y.o. male in NAD HEENT: MMM, EOMI, PERRL, anicteric sclerae CV: RRR, no MRG, no JVD Resp: Non-labored, CTAB, no wheezes noted Abd: Soft, NTND, BS present, no guarding or organomegaly MSK: Full ROM with nocifensive response  Neuro: Alert and oriented, speech normal, antalgic gait with cane Psych: Neatly groomed. Maintains good eye contact and is cooperative and attentive. Speech is normal volume and rate. Mood is "nervous" with a full affect. No suicidal or homicidal ideation. Does not appear to be responding to any internal stimuli. Able to maintain train of thought and concentrate on the  questions. Assessment:  DYAN CREELMAN is a 58 y.o. male here for pain management.  Plan:     Chronic Pain:  - Discussed goals of opioid treatment including: significant improvement in functional status and not necessarily pain resolution.  - Risks communicated including: nausea, vomiting, confusion, sleepiness, fatigue, constipation, worsening sleep apnea, impairment of operation of equipment or vehicles, possible long-term effects on neuro-endocrine system.  - Written pain agreement discussed and signed.  - UDS - Follow up in 3 months or sooner as needed.

## 2014-09-08 LAB — DRUG SCREEN, URINE
Amphetamine Screen, Ur: NEGATIVE
BARBITURATE QUANT UR: NEGATIVE
Benzodiazepines.: POSITIVE — AB
COCAINE METABOLITES: NEGATIVE
Creatinine,U: 97.73 mg/dL
Marijuana Metabolite: NEGATIVE
Methadone: NEGATIVE
Opiates: NEGATIVE
Phencyclidine (PCP): NEGATIVE
Propoxyphene: NEGATIVE

## 2014-12-23 ENCOUNTER — Ambulatory Visit (INDEPENDENT_AMBULATORY_CARE_PROVIDER_SITE_OTHER): Payer: Medicaid Other | Admitting: Family Medicine

## 2014-12-23 ENCOUNTER — Encounter: Payer: Self-pay | Admitting: Family Medicine

## 2014-12-23 VITALS — BP 136/82 | HR 75 | Temp 97.7°F | Ht 67.0 in | Wt 245.5 lb

## 2014-12-23 DIAGNOSIS — G8929 Other chronic pain: Secondary | ICD-10-CM

## 2014-12-23 DIAGNOSIS — J449 Chronic obstructive pulmonary disease, unspecified: Secondary | ICD-10-CM

## 2014-12-23 DIAGNOSIS — Z23 Encounter for immunization: Secondary | ICD-10-CM | POA: Diagnosis not present

## 2014-12-23 DIAGNOSIS — I10 Essential (primary) hypertension: Secondary | ICD-10-CM

## 2014-12-23 DIAGNOSIS — M150 Primary generalized (osteo)arthritis: Secondary | ICD-10-CM

## 2014-12-23 DIAGNOSIS — Z7189 Other specified counseling: Secondary | ICD-10-CM | POA: Diagnosis not present

## 2014-12-23 DIAGNOSIS — Z720 Tobacco use: Secondary | ICD-10-CM

## 2014-12-23 DIAGNOSIS — E7800 Pure hypercholesterolemia, unspecified: Secondary | ICD-10-CM

## 2014-12-23 DIAGNOSIS — M159 Polyosteoarthritis, unspecified: Secondary | ICD-10-CM

## 2014-12-23 DIAGNOSIS — F17200 Nicotine dependence, unspecified, uncomplicated: Secondary | ICD-10-CM

## 2014-12-23 DIAGNOSIS — F411 Generalized anxiety disorder: Secondary | ICD-10-CM

## 2014-12-23 LAB — BASIC METABOLIC PANEL
BUN: 7 mg/dL (ref 6–23)
CALCIUM: 9.2 mg/dL (ref 8.4–10.5)
CHLORIDE: 98 meq/L (ref 96–112)
CO2: 27 mEq/L (ref 19–32)
CREATININE: 0.64 mg/dL (ref 0.50–1.35)
Glucose, Bld: 82 mg/dL (ref 70–99)
Potassium: 4.5 mEq/L (ref 3.5–5.3)
Sodium: 133 mEq/L — ABNORMAL LOW (ref 135–145)

## 2014-12-23 LAB — LIPID PANEL
CHOL/HDL RATIO: 5.3 ratio
CHOLESTEROL: 174 mg/dL (ref 0–200)
HDL: 33 mg/dL — AB (ref >39)
LDL Cholesterol: 125 mg/dL — ABNORMAL HIGH (ref 0–99)
TRIGLYCERIDES: 80 mg/dL (ref <150)
VLDL: 16 mg/dL (ref 0–40)

## 2014-12-23 LAB — POCT GLYCOSYLATED HEMOGLOBIN (HGB A1C): HEMOGLOBIN A1C: 5.5

## 2014-12-23 MED ORDER — HYDROCODONE-ACETAMINOPHEN 5-325 MG PO TABS: 2.0000 | ORAL_TABLET | ORAL | Status: DC | PRN

## 2014-12-23 MED ORDER — OLMESARTAN MEDOXOMIL 40 MG PO TABS: 40.0000 mg | ORAL_TABLET | Freq: Every day | ORAL | Status: DC

## 2014-12-23 MED ORDER — DIAZEPAM 5 MG PO TABS: 5.0000 mg | ORAL_TABLET | Freq: Two times a day (BID) | ORAL | Status: DC | PRN

## 2014-12-23 NOTE — Assessment & Plan Note (Signed)
COPD: Dx 2008. Charted 2011. Recent PFTs showed significant irreversible morbidity per pt report.  - Get ROI for results from PFTs Oct 15, 2014.  - Continue BB due to mortality benefit with CAD, but may transition to bisoprolol for greater cardioselectivity. - Smoking cessation as of April 2015

## 2014-12-23 NOTE — Patient Instructions (Addendum)
We made no medication changes today:  - Keep taking the medications you've been taking, including benicar.  - We refilled the medications for pain and anxiety today - I'll be happy to fill out any forms that come my way. I need the results of the pulmonary function tests.    High blood pressure is best treated by avoiding salt in your diet and increasing potassium from fruits and vegetables (called the DASH diet) and by losing weight.  Take care,  - Dr. Bonner Puna  DASH Eating Plan DASH stands for "Dietary Approaches to Stop Hypertension." The DASH eating plan is a healthy eating plan that has been shown to reduce high blood pressure (hypertension). Additional health benefits may include reducing the risk of type 2 diabetes mellitus, heart disease, and stroke. The DASH eating plan may also help with weight loss. WHAT DO I NEED TO KNOW ABOUT THE DASH EATING PLAN? For the DASH eating plan, you will follow these general guidelines:  Choose foods with a percent daily value for sodium of less than 5% (as listed on the food label).  Use salt-free seasonings or herbs instead of table salt or sea salt.  Check with your health care provider or pharmacist before using salt substitutes.  Eat lower-sodium products, often labeled as "lower sodium" or "no salt added."  Eat fresh foods.  Eat more vegetables, fruits, and low-fat dairy products.  Choose whole grains. Look for the word "whole" as the first word in the ingredient list.  Choose fish and skinless chicken or [place] more often than red meat. Limit fish, poultry, and meat to 6 oz (170 g) each day.  Limit sweets, desserts, sugars, and sugary drinks.  Choose heart-healthy fats.  Limit cheese to 1 oz (28 g) per day.  Eat more home-cooked food and less restaurant, buffet, and fast food.  Limit fried foods.  Cook foods using methods other than frying.  Limit canned vegetables. If you do use them, rinse them well to decrease the  sodium.  When eating at a restaurant, ask that your food be prepared with less salt, or no salt if possible. WHAT FOODS CAN I EAT? Seek help from a dietitian for individual calorie needs. Grains Whole grain or whole wheat bread. Brown rice. Whole grain or whole wheat pasta. Quinoa, bulgur, and whole grain cereals. Low-sodium cereals. Corn or whole wheat flour tortillas. Whole grain cornbread. Whole grain crackers. Low-sodium crackers. Vegetables Fresh or frozen vegetables (raw, steamed, roasted, or grilled). Low-sodium or reduced-sodium tomato and vegetable juices. Low-sodium or reduced-sodium tomato sauce and paste. Low-sodium or reduced-sodium canned vegetables.  Fruits All fresh, canned (in natural juice), or frozen fruits. Meat and Other Protein Products Ground beef (85% or leaner), grass-fed beef, or beef trimmed of fat. Skinless chicken or [place]. Ground chicken or [place]. Pork trimmed of fat. All fish and seafood. Eggs. Dried beans, peas, or lentils. Unsalted nuts and seeds. Unsalted canned beans. Dairy Low-fat dairy products, such as skim or 1% milk, 2% or reduced-fat cheeses, low-fat ricotta or cottage cheese, or plain low-fat yogurt. Low-sodium or reduced-sodium cheeses. Fats and Oils Tub margarines without trans fats. Light or reduced-fat mayonnaise and salad dressings (reduced sodium). Avocado. Safflower, olive, or canola oils. Natural peanut or almond butter. Other Unsalted popcorn and pretzels.  WHAT FOODS ARE NOT RECOMMENDED? Grains White bread. White pasta. White rice. Refined cornbread. Bagels and croissants. Crackers that contain trans fat. Vegetables Creamed or fried vegetables. Vegetables in a cheese sauce. Regular canned vegetables. Regular canned tomato  sauce and paste. Regular tomato and vegetable juices. Fruits Dried fruits. Canned fruit in light or heavy syrup. Fruit juice. Meat and Other Protein Products Fatty cuts of meat. Ribs, chicken wings, bacon, sausage,  bologna, salami, chitterlings, fatback, hot dogs, bratwurst, and packaged luncheon meats. Salted nuts and seeds. Canned beans with salt. Dairy Whole or 2% milk, cream, half-and-half, and cream cheese. Whole-fat or sweetened yogurt. Full-fat cheeses or blue cheese. Nondairy creamers and whipped toppings. Processed cheese, cheese spreads, or cheese curds. Condiments Onion and garlic salt, seasoned salt, table salt, and sea salt. Canned and packaged gravies. Worcestershire sauce. Tartar sauce. Barbecue sauce. Teriyaki sauce. Soy sauce, including reduced sodium. Steak sauce. Fish sauce. Oyster sauce. Cocktail sauce. Horseradish. Ketchup and mustard. Meat flavorings and tenderizers. Bouillon cubes. Hot sauce. Tabasco sauce. Marinades. Taco seasonings. Relishes. Fats and Oils Butter, stick margarine, lard, shortening, ghee, and bacon fat. Coconut, palm kernel, or palm oils. Regular salad dressings. Other Pickles and olives. Salted popcorn and pretzels. The items listed above may not be a complete list of foods and beverages to avoid. Contact your dietitian for more information. WHERE CAN I FIND MORE INFORMATION? National Heart, [employer], and [employer] Institute: travelstabloid.com

## 2014-12-23 NOTE — Assessment & Plan Note (Signed)
Reports cessation as of 03/28/2014

## 2014-12-23 NOTE — Assessment & Plan Note (Signed)
Stage I, well controlled. - Continue current anti-hypertensive regimen: Benicar 40mg , metoprolol 200mg  - Check renal function - DASH diet and weight loss strategies reviewed in detail
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
## 2014-12-23 NOTE — Assessment & Plan Note (Signed)
Lipid panel due

## 2014-12-23 NOTE — Progress Notes (Signed)
Patient ID: George Leon, male   DOB: Aug 15, 1956, 59 y.o.   MRN: [id number]  Subjective: George Leon is a 59 y.o. male with a history of HTN, PVD, CAD, RBBB, LAFB, COPD, tobacco use, hyperlipidemia and chronic left knee pain here for medication refills.  Describes chronic, constant, waxing/waning, severe bilateral knee pain described as a deep aching. It is worsened by certain positions and walking. Occasionally associated with swelling (has been worse lately). Norco allows him to walk and exercise. No negative medication effect on sleep, appetite, mood, relationships, concentration, physical activity. Denies prescriptions from other providers or using multiple pharmacies.   Can walk about 75-150 yards depending on grade without stopping. Stopped smoking March 28, 2014. Uses ventolin generally daily. Takes symbicort as directed but not singulair due to cost.   Does check BP at home. 130s/80s usually. Misses 0 doses out of 7 days. is exercising and is not adherent to a low-salt diet.  - ROS:  HA, dizziness, vision or hearing changes, CP, SOB, palpitations, syncope, PND, orthopnea, leg swelling, abd pain, N/V/D, constipation, urinary problems, rashes. - SH:  Lives with wife and 8 year old son.  substance abuse issues in the household none Work history: [employer] "disbaled" Presently involved in a lawsuit? No History of substance abuse: None Current substance abuse: None Smoking history: Yes, quit 03/28/14 - FH: No substance abuse - Medications: reviewed and updated . albuterol (PROAIR HFA) 108 (90 BASE) MCG/ACT inhaler  . aspirin EC 81 MG tablet  . budesonide-formoterol (SYMBICORT) 160-4.5 MCG/ACT  . diazepam (VALIUM) 5 MG tablet  . diltiazem (TAZTIA XT) 180 MG 24 hr capsule  . esomeprazole (NEXIUM) 40 MG capsule  . fish oil-omega-3 fatty acids 1000 MG capsule  . HYDROcodone-acetaminophen (NORCO) 5-325 MG  . metoprolol (TOPROL XL) 200 MG 24 hr tablet  . montelukast (SINGULAIR) 4  MG chewable tablet  . quinapril (ACCUPRIL) 40 MG tablet  . rosuvastatin (CRESTOR) 10 MG tablet   Objective: BP 136/82 mmHg  Pulse 75  Temp(Src) 97.7 F (36.5 C) (Oral)  Ht 5\' 7"  (1.702 m)  Wt 245 lb 8 oz (111.358 kg)  BMI 38.44 kg/m2 Gen: Chronically Ill-appearing 59 y.o.male in NAD HEENT: MMM, posterior oropharynx clear, poor dentition Neck: Normal carotid upstroke, no bruits; thyroid not enlarged  Pulm: Non-labored; CTAB CV: Regular rate, no murmur, rub or gallop; no LE edema, no JVD; distal pulses symmetric/1+ GI: Normoactive BS; soft, NT, ND, no HSM  MSK: No gross deformities, normal muscle tone and bulk. Homan's sign negative.  Neuro: Alert and oriented x4, speech and gait are normal, strength symmetric 5/5 throughout, sensation intact to light touch throughout  Lab Results  Component Value Date   CHOL 129 11/29/2008   HDL 44 11/29/2008   LDLCALC 65 11/29/2008   LDLDIRECT 85 07/22/2012   TRIG 98 11/29/2008   CHOLHDL 2.9 Ratio 11/29/2008   Assessment/Plan: George Leon is a 59 y.o. male here for medication refills.

## 2014-12-28 ENCOUNTER — Encounter: Payer: Self-pay | Admitting: Family Medicine

## 2014-12-28 ENCOUNTER — Ambulatory Visit (INDEPENDENT_AMBULATORY_CARE_PROVIDER_SITE_OTHER): Payer: Medicaid Other | Admitting: Family Medicine

## 2014-12-28 VITALS — BP 154/91 | HR 82 | Temp 97.4°F | Ht 67.0 in | Wt 247.9 lb

## 2014-12-28 DIAGNOSIS — M1712 Unilateral primary osteoarthritis, left knee: Secondary | ICD-10-CM | POA: Diagnosis not present

## 2014-12-28 DIAGNOSIS — M1711 Unilateral primary osteoarthritis, right knee: Secondary | ICD-10-CM | POA: Diagnosis not present

## 2014-12-28 MED ORDER — METHYLPREDNISOLONE ACETATE 80 MG/ML IJ SUSP
80.0000 mg | Freq: Once | INTRAMUSCULAR | Status: AC
  Administered 2014-12-28: 80 mg via INTRA_ARTICULAR

## 2014-12-28 NOTE — Progress Notes (Signed)
Patient ID: George Leon, male   DOB: [birth year], 59 y.o.   MRN: [id number]  [name] presents today for bilateral knee injections for relief of pain due to osteoarthritis.   Patient was given informed consent, signed copy in the chart. Appropriate time out was taken. Both knees were prepped and draped in usual sterile fashion. 1 cc of methylprednisolone 80 mg/ml plus  4 cc of 1% lidocaine without epinephrine was injected into the left and right knees using a medial approach. The patient tolerated the procedure well. There were no complications. Post procedure instructions were given.  [name] B. Bonner Puna, MD, PGY-2 12/28/2014 4:16 PM

## 2014-12-28 NOTE — Patient Instructions (Signed)
Knee Injection Joint injections are shots. Your caregiver will place a needle into your knee joint. The needle is used to put medicine into the joint. These shots can be used to help treat different painful knee conditions such as osteoarthritis, bursitis, local flare-ups of rheumatoid arthritis, and pseudogout. Anti-inflammatory medicines such as corticosteroids and anesthetics are the most common medicines used for joint and soft tissue injections.  PROCEDURE  The skin over the kneecap will be cleaned with an antiseptic solution.  Your caregiver will inject a small amount of a local anesthetic (a medicine like Novocaine) just under the skin in the area that was cleaned.  After the area becomes numb, a second injection is done. This second injection usually includes an anesthetic and an anti-inflammatory medicine called a steroid or cortisone. The needle is carefully placed in between the kneecap and the knee, and the medicine is injected into the joint space.  After the injection is done, the needle is removed. Your caregiver may place a bandage over the injection site. The whole procedure takes no more than a couple of minutes. BEFORE THE PROCEDURE  Wash all of the skin around the entire knee area. Try to remove any loose, scaling skin. There is no other specific preparation necessary unless advised otherwise by your caregiver. LET YOUR CAREGIVER KNOW ABOUT:   Allergies.  Medications taken including herbs, eye drops, over the counter medications, and creams.  Use of steroids (by mouth or creams).  Possible pregnancy, if applicable.  Previous problems with anesthetics or Novocaine.  History of blood clots (thrombophlebitis).  History of bleeding or blood problems.  Previous surgery.  Other health problems. RISKS AND COMPLICATIONS Side effects from cortisone shots are rare. They include:   Slight bruising of the skin.  Shrinkage of the normal fatty tissue under the skin where  the shot was given.  Increase in pain after the shot.  Infection.  Weakening of tendons or tendon rupture.  Allergic reaction to the medicine.  Diabetics may have a temporary increase in their blood sugar after a shot.  Cortisone can temporarily weaken the immune system. While receiving these shots, you should not get certain vaccines. Also, avoid contact with anyone who has chickenpox or measles. Especially if you have never had these diseases or have not been previously immunized. Your immune system may not be strong enough to fight off the infection while the cortisone is in your system. AFTER THE PROCEDURE   You can go home after the procedure.  You may need to put ice on the joint 15-20 minutes every 3 or 4 hours until the pain goes away.  You may need to put an elastic bandage on the joint. HOME CARE INSTRUCTIONS   Only take over-the-counter or prescription medicines for pain, discomfort, or fever as directed by your caregiver.  You should avoid stressing the joint. Unless advised otherwise, avoid activities that put a lot of pressure on a knee joint, such as:  Jogging.  Bicycling.  Recreational climbing.  Hiking.  Laying down and elevating the leg/knee above the level of your heart can help to minimize swelling. SEEK MEDICAL CARE IF:   You have repeated or worsening swelling.  There is drainage from the puncture area.  You develop red streaking that extends above or below the site where the needle was inserted. SEEK IMMEDIATE MEDICAL CARE IF:   You develop a fever.  You have pain that gets worse even though you are taking pain medicine.  The area is   red and warm, and you have trouble moving the joint. MAKE SURE YOU:   Understand these instructions.  Will watch your condition.  Will get help right away if you are not doing well or get worse. Document Released: 02/16/2007 Document Revised: 02/17/2012 Document Reviewed: 11/13/2007 ExitCare Patient  Information 2015 [employer]. This information is not intended to replace advice given to you by your health care provider. Make sure you discuss any questions you have with your health care provider.  

## 2015-02-07 ENCOUNTER — Other Ambulatory Visit: Payer: Self-pay | Admitting: *Deleted

## 2015-02-08 MED ORDER — ESOMEPRAZOLE MAGNESIUM 40 MG PO CPDR: 40.0000 mg | DELAYED_RELEASE_CAPSULE | Freq: Every day | ORAL | Status: DC | PRN

## 2015-02-14 ENCOUNTER — Other Ambulatory Visit: Payer: Self-pay | Admitting: *Deleted

## 2015-02-14 DIAGNOSIS — I10 Essential (primary) hypertension: Secondary | ICD-10-CM

## 2015-02-15 ENCOUNTER — Other Ambulatory Visit: Payer: Self-pay | Admitting: Family Medicine

## 2015-02-15 DIAGNOSIS — I10 Essential (primary) hypertension: Secondary | ICD-10-CM

## 2015-02-15 MED ORDER — OLMESARTAN MEDOXOMIL 40 MG PO TABS: 40.0000 mg | ORAL_TABLET | Freq: Every day | ORAL | Status: DC

## 2015-03-14 ENCOUNTER — Encounter: Payer: Self-pay | Admitting: Family Medicine

## 2015-03-15 ENCOUNTER — Other Ambulatory Visit: Payer: Self-pay | Admitting: Family Medicine

## 2015-03-15 DIAGNOSIS — M159 Polyosteoarthritis, unspecified: Secondary | ICD-10-CM

## 2015-03-15 DIAGNOSIS — M150 Primary generalized (osteo)arthritis: Principal | ICD-10-CM

## 2015-03-16 ENCOUNTER — Encounter: Payer: Self-pay | Admitting: Family Medicine

## 2015-03-16 ENCOUNTER — Ambulatory Visit (INDEPENDENT_AMBULATORY_CARE_PROVIDER_SITE_OTHER): Payer: Medicaid Other | Admitting: Family Medicine

## 2015-03-16 VITALS — BP 149/99 | HR 72 | Temp 97.8°F | Ht 67.0 in | Wt 249.0 lb

## 2015-03-16 DIAGNOSIS — M25552 Pain in left hip: Secondary | ICD-10-CM

## 2015-03-16 DIAGNOSIS — M7062 Trochanteric bursitis, left hip: Secondary | ICD-10-CM | POA: Diagnosis not present

## 2015-03-16 MED ORDER — METHYLPREDNISOLONE ACETATE 80 MG/ML IJ SUSP
80.0000 mg | Freq: Once | INTRAMUSCULAR | Status: AC
  Administered 2015-03-16: 80 mg via INTRA_ARTICULAR

## 2015-03-16 MED ORDER — HYDROCODONE-ACETAMINOPHEN 5-325 MG PO TABS: 2.0000 | ORAL_TABLET | ORAL | Status: DC | PRN

## 2015-03-16 NOTE — Assessment & Plan Note (Signed)
Injection performed today. Narcotics refilled by primary and given to me.  I gave Rx's to patient.
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
## 2015-03-16 NOTE — Patient Instructions (Signed)
Take your pain medicine as prescribed.  Follow up with Dr. Bonner Puna if you continue to have hip pain.  Take care  Dr. Lacinda Axon

## 2015-03-28 ENCOUNTER — Encounter: Payer: Self-pay | Admitting: Family Medicine

## 2015-04-21 ENCOUNTER — Encounter: Payer: Self-pay | Admitting: Family Medicine

## 2015-04-21 ENCOUNTER — Ambulatory Visit (INDEPENDENT_AMBULATORY_CARE_PROVIDER_SITE_OTHER): Payer: Medicaid Other | Admitting: Family Medicine

## 2015-04-21 VITALS — BP 138/91 | HR 106 | Temp 98.4°F | Ht 67.0 in | Wt 251.8 lb

## 2015-04-21 DIAGNOSIS — M533 Sacrococcygeal disorders, not elsewhere classified: Secondary | ICD-10-CM | POA: Diagnosis not present

## 2015-04-21 DIAGNOSIS — M1711 Unilateral primary osteoarthritis, right knee: Secondary | ICD-10-CM

## 2015-04-21 DIAGNOSIS — I739 Peripheral vascular disease, unspecified: Secondary | ICD-10-CM

## 2015-04-21 DIAGNOSIS — M1712 Unilateral primary osteoarthritis, left knee: Secondary | ICD-10-CM

## 2015-04-21 DIAGNOSIS — I10 Essential (primary) hypertension: Secondary | ICD-10-CM

## 2015-04-21 DIAGNOSIS — F411 Generalized anxiety disorder: Secondary | ICD-10-CM | POA: Diagnosis not present

## 2015-04-21 DIAGNOSIS — Z0271 Encounter for disability determination: Secondary | ICD-10-CM | POA: Insufficient documentation

## 2015-04-21 MED ORDER — DIAZEPAM 5 MG PO TABS: 5.0000 mg | ORAL_TABLET | Freq: Two times a day (BID) | ORAL | Status: DC | PRN

## 2015-04-21 MED ORDER — ROSUVASTATIN CALCIUM 10 MG PO TABS: 10.0000 mg | ORAL_TABLET | Freq: Every day | ORAL | Status: DC

## 2015-04-21 MED ORDER — DILTIAZEM HCL ER BEADS 180 MG PO CP24: 180.0000 mg | ORAL_CAPSULE | Freq: Every day | ORAL | Status: DC

## 2015-04-21 MED ORDER — METHYLPREDNISOLONE ACETATE 80 MG/ML IJ SUSP
80.0000 mg | Freq: Once | INTRAMUSCULAR | Status: AC
  Administered 2015-04-21: 80 mg via INTRA_ARTICULAR

## 2015-04-21 MED ORDER — METOPROLOL SUCCINATE ER 200 MG PO TB24: 200.0000 mg | ORAL_TABLET | Freq: Every day | ORAL | Status: DC

## 2015-04-24 ENCOUNTER — Telehealth: Payer: Self-pay | Admitting: Family Medicine

## 2015-04-24 NOTE — Telephone Encounter (Signed)
Will forward to MD and RN team to see if PA form was received. George Leon,CMA

## 2015-04-24 NOTE — Telephone Encounter (Signed)
I called to discuss this with George Leon. Our office has not received any forms to authorize these medications he has been on for quite some time. He still has some medication left over and will take those in the meantime. We also discussed his disability claim with the DOE. He has not heard back about a referral at this time.

## 2015-04-24 NOTE — Telephone Encounter (Signed)
Patient is calling because his prescriptions, diltiazem (TAZTIA XT) 180 MG 24 hr capsule and  rosuvastatin (CRESTOR) 10 MG tablet, are on hold at the pharmacy because Medicaid needs authorization first. Please contact the patient once it is complete. Thank you, Fonda Kinder, ASA

## 2015-04-24 NOTE — Telephone Encounter (Signed)
Prior Authorization received from [employer] for [employer]. Formulary and PA form placed in provider box for completion. Derl Barrow, RN

## 2015-04-25 NOTE — Telephone Encounter (Signed)
Received PA approval for Crestor 10 mg tablet via [place] [employer].  Med approved for 04/25/15 - 04/24/16.  [facility] pharmacy informed.  PA confirmation number [id number] Teresita Madura, Rosine Beat, RN

## 2015-04-28 NOTE — Patient Instructions (Signed)
- High blood pressure is best treated by losing weight, taking medications as directed, avoiding salt in your diet and increasing potassium from fruits and vegetables (called the DASH diet). - Follow up sooner if you have any concerns. If you feel faint, experience new/worsening chest pain or shortness of breath, or notice rapid leg swelling and/or weight gain you should call the clinic at [phone] OR go directly to the ER.   Take care,  - Dr. Bonner Puna  DASH Eating Plan DASH stands for "Dietary Approaches to Stop Hypertension." The DASH eating plan is a healthy eating plan that has been shown to reduce high blood pressure (hypertension). Additional health benefits may include reducing the risk of type 2 diabetes mellitus, heart disease, and stroke. The DASH eating plan may also help with weight loss. WHAT DO I NEED TO KNOW ABOUT THE DASH EATING PLAN? For the DASH eating plan, you will follow these general guidelines:  Choose foods with a percent daily value for sodium of less than 5% (as listed on the food label).  Use salt-free seasonings or herbs instead of table salt or sea salt.  Check with your health care provider or pharmacist before using salt substitutes.  Eat lower-sodium products, often labeled as "lower sodium" or "no salt added."  Eat fresh foods.  Eat more vegetables, fruits, and low-fat dairy products.  Choose whole grains. Look for the word "whole" as the first word in the ingredient list.  Choose fish and skinless chicken or [place] more often than red meat. Limit fish, poultry, and meat to 6 oz (170 g) each day.  Limit sweets, desserts, sugars, and sugary drinks.  Choose heart-healthy fats.  Limit cheese to 1 oz (28 g) per day.  Eat more home-cooked food and less restaurant, buffet, and fast food.  Limit fried foods.  Cook foods using methods other than frying.  Limit canned vegetables. If you do use them, rinse them well to decrease the sodium.  When eating at  a restaurant, ask that your food be prepared with less salt, or no salt if possible. WHAT FOODS CAN I EAT? Seek help from a dietitian for individual calorie needs. Grains Whole grain or whole wheat bread. Brown rice. Whole grain or whole wheat pasta. Quinoa, bulgur, and whole grain cereals. Low-sodium cereals. Corn or whole wheat flour tortillas. Whole grain cornbread. Whole grain crackers. Low-sodium crackers. Vegetables Fresh or frozen vegetables (raw, steamed, roasted, or grilled). Low-sodium or reduced-sodium tomato and vegetable juices. Low-sodium or reduced-sodium tomato sauce and paste. Low-sodium or reduced-sodium canned vegetables.  Fruits All fresh, canned (in natural juice), or frozen fruits. Meat and Other Protein Products Ground beef (85% or leaner), grass-fed beef, or beef trimmed of fat. Skinless chicken or [place]. Ground chicken or [place]. Pork trimmed of fat. All fish and seafood. Eggs. Dried beans, peas, or lentils. Unsalted nuts and seeds. Unsalted canned beans. Dairy Low-fat dairy products, such as skim or 1% milk, 2% or reduced-fat cheeses, low-fat ricotta or cottage cheese, or plain low-fat yogurt. Low-sodium or reduced-sodium cheeses. Fats and Oils Tub margarines without trans fats. Light or reduced-fat mayonnaise and salad dressings (reduced sodium). Avocado. Safflower, olive, or canola oils. Natural peanut or almond butter. Other Unsalted popcorn and pretzels.  WHAT FOODS ARE NOT RECOMMENDED? Grains White bread. White pasta. White rice. Refined cornbread. Bagels and croissants. Crackers that contain trans fat. Vegetables Creamed or fried vegetables. Vegetables in a cheese sauce. Regular canned vegetables. Regular canned tomato sauce and paste. Regular tomato and vegetable  juices. Fruits Dried fruits. Canned fruit in light or heavy syrup. Fruit juice. Meat and Other Protein Products Fatty cuts of meat. Ribs, chicken wings, bacon, sausage, bologna, salami,  chitterlings, fatback, hot dogs, bratwurst, and packaged luncheon meats. Salted nuts and seeds. Canned beans with salt. Dairy Whole or 2% milk, cream, half-and-half, and cream cheese. Whole-fat or sweetened yogurt. Full-fat cheeses or blue cheese. Nondairy creamers and whipped toppings. Processed cheese, cheese spreads, or cheese curds. Condiments Onion and garlic salt, seasoned salt, table salt, and sea salt. Canned and packaged gravies. Worcestershire sauce. Tartar sauce. Barbecue sauce. Teriyaki sauce. Soy sauce, including reduced sodium. Steak sauce. Fish sauce. Oyster sauce. Cocktail sauce. Horseradish. Ketchup and mustard. Meat flavorings and tenderizers. Bouillon cubes. Hot sauce. Tabasco sauce. Marinades. Taco seasonings. Relishes. Fats and Oils Butter, stick margarine, lard, shortening, ghee, and bacon fat. Coconut, palm kernel, or palm oils. Regular salad dressings. Other Pickles and olives. Salted popcorn and pretzels. The items listed above may not be a complete list of foods and beverages to avoid. Contact your dietitian for more information. WHERE CAN I FIND MORE INFORMATION? [employer], [employer], [employer]: travelstabloid.com

## 2015-04-28 NOTE — Assessment & Plan Note (Signed)
Continue supportive treatments. Injection performed today with some instant relief.

## 2015-04-28 NOTE — Assessment & Plan Note (Signed)
Reports symptoms worsening which have been treated well with injections in the past. We have no spinal needles and so referral to sports medicine is being made.

## 2015-04-28 NOTE — Assessment & Plan Note (Signed)
As I have discussed at length during this visit, I believe he is best served by an occupational health physician evaluating him for this disability. I have made this referral, though Mr. Gearin strongly prefers that I fill this out.

## 2015-04-28 NOTE — Assessment & Plan Note (Signed)
currently borderline controlled (goal 979/89); certainly improved from previous readings. No evidence of electrolyte derangements due to medications or renal impairment due to CKD. Continue current regimen and consider titrating upwards at next visit if still borderline/elevated.  - Primary elements of DASH diet and weight loss strategies reviewed in detail. Handout given.

## 2015-04-28 NOTE — Progress Notes (Signed)
Subjective: George Leon is a 59 y.o. male here for hypertension follow up.   he never checks BP at home. Misses 0 doses out of 7 days. is exercising and is adherent to a low-salt diet. he is able to walk less than 1 block without dyspnea which is stable. No increased cough, sputum, fever, chills, CP, SOB, palpitations, syncope, dizziness, orthopnea, PND, frequent headaches, vision changes, claudication, leg swelling.  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  - Medications: reviewed and updated  Objective: BP 138/91 mmHg  Pulse 106  Temp(Src) 98.4 F (36.9 C) (Oral)  Ht 5\' 7"  (1.702 m)  Wt 251 lb 12.8 oz (114.216 kg)  BMI 39.43 kg/m2 Gen: Chronically ill-appearing 59 y.o.male in no distress Neck: No bruits; thyroid not enlarged  Pulm: Non-labored breathing room air; CTAB CV: Regular rate with normal S1/S2, no murmur; no LE edema, no JVD; DP and radial pulses symmetric and 2+. Homan's sign absent. cap refill < 2 sec.  Assessment & Plan: George Leon is a 58 y.o. male here for hypertension.

## 2015-05-11 ENCOUNTER — Other Ambulatory Visit: Payer: Self-pay

## 2015-05-11 ENCOUNTER — Encounter: Payer: Self-pay | Admitting: Family Medicine

## 2015-05-11 ENCOUNTER — Ambulatory Visit (INDEPENDENT_AMBULATORY_CARE_PROVIDER_SITE_OTHER): Payer: Medicaid Other | Admitting: Family Medicine

## 2015-05-11 VITALS — BP 128/72 | HR 78 | Temp 98.1°F | Ht 67.5 in | Wt 247.0 lb

## 2015-05-11 DIAGNOSIS — R0602 Shortness of breath: Secondary | ICD-10-CM | POA: Diagnosis present

## 2015-05-11 DIAGNOSIS — R600 Localized edema: Secondary | ICD-10-CM | POA: Insufficient documentation

## 2015-05-11 LAB — POCT UA - MICROSCOPIC ONLY

## 2015-05-11 LAB — CBC
HCT: 41.4 % (ref 39.0–52.0)
Hemoglobin: 14.7 g/dL (ref 13.0–17.0)
MCH: 30.7 pg (ref 26.0–34.0)
MCHC: 35.5 g/dL (ref 30.0–36.0)
MCV: 86.4 fL (ref 78.0–100.0)
MPV: 10.8 fL (ref 8.6–12.4)
PLATELETS: 268 10*3/uL (ref 150–400)
RBC: 4.79 MIL/uL (ref 4.22–5.81)
RDW: 13.7 % (ref 11.5–15.5)
WBC: 14.5 10*3/uL — AB (ref 4.0–10.5)

## 2015-05-11 LAB — COMPREHENSIVE METABOLIC PANEL
ALBUMIN: 3.9 g/dL (ref 3.5–5.2)
ALT: 12 U/L (ref 0–53)
AST: 14 U/L (ref 0–37)
Alkaline Phosphatase: 61 U/L (ref 39–117)
BILIRUBIN TOTAL: 0.4 mg/dL (ref 0.2–1.2)
BUN: 3 mg/dL — ABNORMAL LOW (ref 6–23)
CALCIUM: 8.7 mg/dL (ref 8.4–10.5)
CHLORIDE: 93 meq/L — AB (ref 96–112)
CO2: 25 mEq/L (ref 19–32)
Creat: 0.57 mg/dL (ref 0.50–1.35)
Glucose, Bld: 130 mg/dL — ABNORMAL HIGH (ref 70–99)
Potassium: 3.2 mEq/L — ABNORMAL LOW (ref 3.5–5.3)
SODIUM: 128 meq/L — AB (ref 135–145)
TOTAL PROTEIN: 6.1 g/dL (ref 6.0–8.3)

## 2015-05-11 LAB — POCT URINALYSIS DIPSTICK
Bilirubin, UA: NEGATIVE
GLUCOSE UA: NEGATIVE
KETONES UA: NEGATIVE
Nitrite, UA: NEGATIVE
Protein, UA: 100
Spec Grav, UA: 1.025
Urobilinogen, UA: 4
pH, UA: 6.5

## 2015-05-11 LAB — TSH: TSH: 0.662 u[IU]/mL (ref 0.350–4.500)

## 2015-05-11 MED ORDER — FUROSEMIDE 20 MG PO TABS: 20.0000 mg | ORAL_TABLET | Freq: Every day | ORAL | Status: DC

## 2015-05-11 NOTE — Patient Instructions (Addendum)
Thank you for coming to the clinic today. It was nice seeing you.  There are a lot of conditions that can cause leg swelling. Due to your heart history, I am concerned that you may have congestive heart failure. We will obtain an echocardiogram and blood work today to help determine if this is the cause. We will also obtain blood work today to make sure there are not any other causes for your swelling.  I would like to start you on lasix today. Please take 1 pill every morning.   Please come back in a week for follow up.  If you develop any chest pain, or severe shortness of breath, please seek medical care immediately.

## 2015-05-11 NOTE — Assessment & Plan Note (Addendum)
Concern for new CHF given history of CAD s/p MI x2, and constellation of PND and increased shortness of breath. EKG with minimal criteria for LVH. Will obtain echocardiogram, BNP, CBC, CMP and TSH.   2+ protein noted on UA. Will obtain urine protein:creatinine ratio to rule out nephrotic range proteinuria.   Will treat with lasix 20mg  daily. Will follow up in 1 week.  Strict return precautions given.

## 2015-05-11 NOTE — Addendum Note (Signed)
Addended by: [place], Romonia Yanik on: 05/11/2015 05:27 PM   Modules accepted: Orders

## 2015-05-11 NOTE — Progress Notes (Signed)
George Leon is a 59 y.o. male who presents to the [facility] today with a chief complaint of bilateral ankle swelling. His concerns today include:  HPI:  Lower extremity swelling Patient reports that symptoms started about 1 month ago. He has not had swelling in his legs before. States that swelling "comes and goes," but is overall better than it was yesterday. Also reports increased shortness of breath for the past few weeks. Is able to lie flat, and sleeps on 2 thin pillows. Is only able to stay in bed for a few hours before having to move to a chair due to back pain. Endorses some PND.  Patient has a remote history of MI x2. Has not seen a cardiologist in "a long time."  No chest pain. No symptoms of claudication, though patient reports that he is unable to walk long distances due to his osteoarthritis.    ROS: As per HPI, otherwise all systems reviewed and are negative.  Past Medical History - Reviewed and updated Patient Active Problem List   Diagnosis Date Noted  . Bilateral lower extremity edema 05/11/2015  . Issue of medical certificate for disability examination 04/21/2015  . Trochanteric bursitis of left hip 03/16/2015  . Osteoarthritis of left knee 12/28/2014  . Osteoarthritis of right knee 12/28/2014  . Peripheral vascular disease 08/23/2013  . CAD (coronary artery disease) 08/23/2013  . Sciatica of left side 03/30/2013  . Disorder of SI (sacroiliac) joint 03/30/2013  . GANGLION CYST, WRIST, LEFT 11/16/2010  . COPD (chronic obstructive pulmonary disease) 10/26/2010  . Anxiety state 05/24/2009  . Osteoarthritis of multiple joints 08/25/2008  . OBESITY, MORBID 05/26/2008  . GERD 06/19/2007  . TOBACCO ABUSE 04/07/2007  . Encounter for chronic pain management 04/07/2007  . HYPERCHOLESTEROLEMIA 02/16/2007  . HYPERTENSION, BENIGN 02/16/2007    Medications- reviewed and updated Current Outpatient Prescriptions  Medication Sig Dispense Refill  . albuterol (PROAIR HFA)  108 (90 BASE) MCG/ACT inhaler Inhale 1-2 puffs into the lungs every 4 (four) hours as needed. As needed for shortness of breath /wheeze/cough 1 each 2  . aspirin EC 81 MG tablet Take 1 tablet (81 mg total) by mouth daily.    . budesonide-formoterol (SYMBICORT) 160-4.5 MCG/ACT inhaler Inhale 2 puffs into the lungs 2 (two) times daily. 1 Inhaler 12  . diazepam (VALIUM) 5 MG tablet Take 1 tablet (5 mg total) by mouth every 12 (twelve) hours as needed for anxiety. 60 tablet 2  . diltiazem (TAZTIA XT) 180 MG 24 hr capsule Take 1 capsule (180 mg total) by mouth daily. 90 capsule 3  . esomeprazole (NEXIUM) 40 MG capsule Take 1 capsule (40 mg total) by mouth daily as needed. indigestion 90 capsule 3  . fish oil-omega-3 fatty acids 1000 MG capsule Take 1 g by mouth 2 (two) times daily.    . furosemide (LASIX) 20 MG tablet Take 1 tablet (20 mg total) by mouth daily. 30 tablet 0  . HYDROcodone-acetaminophen (NORCO) 5-325 MG per tablet Take 2 tablets by mouth every 4 (four) hours as needed for moderate pain. 240 tablet 0  . HYDROcodone-acetaminophen (NORCO) 5-325 MG per tablet Take 2 tablets by mouth every 4 (four) hours as needed for moderate pain. 240 tablet 0  . HYDROcodone-acetaminophen (NORCO/VICODIN) 5-325 MG per tablet Take 2 tablets by mouth every 4 (four) hours as needed for moderate pain. 240 tablet 0  . metoprolol (TOPROL XL) 200 MG 24 hr tablet Take 1 tablet (200 mg total) by mouth daily. 90 tablet  3  . montelukast (SINGULAIR) 4 MG chewable tablet Chew 1 tablet (4 mg total) by mouth at bedtime. 30 tablet 0  . olmesartan (BENICAR) 40 MG tablet Take 1 tablet (40 mg total) by mouth daily. 90 tablet 3  . rosuvastatin (CRESTOR) 10 MG tablet Take 1 tablet (10 mg total) by mouth daily. Substitute for Lipitor 90 tablet 3   No current facility-administered medications for this visit.    Objective: Physical Exam: BP 128/72 mmHg  Pulse 78  Temp(Src) 98.1 F (36.7 C) (Oral)  Ht 5' 7.5" (1.715 m)  Wt  247 lb (112.038 kg)  BMI 38.09 kg/m2  SpO2 95%  Gen: 59 yo man in NAD sitting on exam table CV: RRR with no murmurs appreciated, No JVD or hepatojugular reflex noted, though difficult exam due to patient's body habitus.  Lungs: NWOB, Mild diffuse expiratory wheezes, but otherwise CTAB with no crackles Abdomen: Normal bowel sounds present. Soft, Nontender, Nondistended. Ext: 2+ pitting edema to knees bilaterally. Sock lines present. DP pulses 2+ Skin: warm, dry Neuro: grossly normal, moves all extremities  EKG: NSR, 56mm R wave in aVL, no acute ischemic changes  A/P: See problem list  Bilateral lower extremity edema Concern for new CHF given history of CAD s/p MI x2, and constellation of PND and increased shortness of breath. EKG with minimal criteria for LVH. Will obtain echocardiogram, BNP, CBC, CMP and TSH.   2+ protein noted on UA. Will obtain urine protein:creatinine ratio to rule out nephrotic range proteinuria.   Will treat with lasix 20mg  daily. Will follow up in 1 week.  Strict return precautions given.      Orders Placed This Encounter  Procedures  . CBC  . Comprehensive metabolic panel  . Pro b natriuretic peptide  . TSH  . Protein / creatinine ratio, urine  . POCT urinalysis dipstick  . EKG 12-Lead  . Echocardiogram    Standing Status: Future     Number of Occurrences:      Standing Expiration Date: 08/10/2016    Order Specific Question:  Where should this test be performed    Answer:  [place]    Order Specific Question:  Complete or Limited study?    Answer:  Complete    Order Specific Question:  With Image Enhancing Agent or without Image Enhancing Agent?    Answer:  With Image Enhancing Agent    Order Specific Question:  Reason for exam-Echo    Answer:  Other - See Comments Section    Meds ordered this encounter  Medications  . DISCONTD: furosemide (LASIX) 20 MG tablet    Sig: Take 1 tablet (20 mg total) by mouth daily.    Dispense:  30 tablet     Refill:  0  . furosemide (LASIX) 20 MG tablet    Sig: Take 1 tablet (20 mg total) by mouth daily.    Dispense:  30 tablet    Refill:  0     Caleb M. Jerline Pain, [place] Resident PGY-1 05/11/2015 5:15 PM

## 2015-05-12 ENCOUNTER — Telehealth: Payer: Self-pay | Admitting: Family Medicine

## 2015-05-12 LAB — PROTEIN / CREATININE RATIO, URINE
Creatinine, Urine: 129.9 mg/dL
Protein Creatinine Ratio: 0.45 — ABNORMAL HIGH (ref <0.15)
Total Protein, Urine: 59 mg/dL — ABNORMAL HIGH (ref 5–25)

## 2015-05-12 LAB — PRO B NATRIURETIC PEPTIDE: PRO B NATRI PEPTIDE: 91.62 pg/mL (ref <126)

## 2015-05-12 MED ORDER — POTASSIUM CHLORIDE ER 10 MEQ PO TBCR: 20.0000 meq | EXTENDED_RELEASE_TABLET | Freq: Every day | ORAL | Status: DC

## 2015-05-12 NOTE — Progress Notes (Signed)
I was preceptor the day of this visit.   

## 2015-05-12 NOTE — Telephone Encounter (Signed)
Called patient and informed him of lab results. Told him that I would be sending in a prescription for potassium supplements to his pharmacy. Patient acknowledged this and said that he would pick it up today.  Will follow up in 6 days.  George Leon. Jerline Pain, [place] Resident PGY-1 05/12/2015 2:23 PM

## 2015-05-18 ENCOUNTER — Ambulatory Visit (INDEPENDENT_AMBULATORY_CARE_PROVIDER_SITE_OTHER): Payer: Medicaid Other | Admitting: Family Medicine

## 2015-05-18 ENCOUNTER — Encounter: Payer: Self-pay | Admitting: Family Medicine

## 2015-05-18 VITALS — BP 123/75 | HR 86 | Temp 97.9°F | Ht 68.0 in | Wt 244.0 lb

## 2015-05-18 DIAGNOSIS — R739 Hyperglycemia, unspecified: Secondary | ICD-10-CM | POA: Diagnosis not present

## 2015-05-18 DIAGNOSIS — R809 Proteinuria, unspecified: Secondary | ICD-10-CM | POA: Diagnosis present

## 2015-05-18 DIAGNOSIS — I2510 Atherosclerotic heart disease of native coronary artery without angina pectoris: Secondary | ICD-10-CM | POA: Diagnosis not present

## 2015-05-18 DIAGNOSIS — R600 Localized edema: Secondary | ICD-10-CM | POA: Diagnosis not present

## 2015-05-18 LAB — POCT GLYCOSYLATED HEMOGLOBIN (HGB A1C): HEMOGLOBIN A1C: 5.5

## 2015-05-18 NOTE — Patient Instructions (Signed)
It was nice to see you today.  Continue the lasix 40 mg daily.  I will call with your lab results.  Be sure to get your Echo.   Follow up (after I talk with you).  Dr. Lacinda Axon

## 2015-05-19 ENCOUNTER — Telehealth: Payer: Self-pay | Admitting: Family Medicine

## 2015-05-19 ENCOUNTER — Ambulatory Visit (HOSPITAL_COMMUNITY)
Admission: RE | Admit: 2015-05-19 | Discharge: 2015-05-19 | Disposition: A | Discharge status: Home / Self Care | Payer: Medicaid Other | Source: Ambulatory Visit | Attending: Family Medicine | Admitting: Family Medicine

## 2015-05-19 DIAGNOSIS — I2510 Atherosclerotic heart disease of native coronary artery without angina pectoris: Secondary | ICD-10-CM

## 2015-05-19 DIAGNOSIS — I10 Essential (primary) hypertension: Secondary | ICD-10-CM | POA: Insufficient documentation

## 2015-05-19 DIAGNOSIS — J449 Chronic obstructive pulmonary disease, unspecified: Secondary | ICD-10-CM | POA: Diagnosis not present

## 2015-05-19 LAB — BASIC METABOLIC PANEL WITH GFR
BUN: 4 mg/dL — ABNORMAL LOW (ref 6–23)
CO2: 28 meq/L (ref 19–32)
Calcium: 9.1 mg/dL (ref 8.4–10.5)
Chloride: 96 mEq/L (ref 96–112)
Creat: 0.66 mg/dL (ref 0.50–1.35)
GFR, Est African American: >89 mL/min
GFR, Est Non African American: >89 mL/min
Glucose, Bld: 99 mg/dL (ref 70–99)
POTASSIUM: 3.3 meq/L — AB (ref 3.5–5.3)
Sodium: 133 mEq/L — ABNORMAL LOW (ref 135–145)

## 2015-05-19 LAB — PROTEIN / CREATININE RATIO, URINE
CREATININE, URINE: 179.1 mg/dL
PROTEIN CREATININE RATIO: 0.35 — AB (ref <0.15)
TOTAL PROTEIN, URINE: 62 mg/dL — AB (ref 5–25)

## 2015-05-19 NOTE — Progress Notes (Signed)
I was preceptor the day of this visit.   

## 2015-05-19 NOTE — Progress Notes (Signed)
  Echocardiogram 2D Echocardiogram has been performed.  Jennette Dubin 05/19/2015, 12:11 PM

## 2015-05-19 NOTE — Telephone Encounter (Signed)
Called and left voicemail to have patient return call. Please inform of continued protein in the urine. Patient should increase potassium to 40 meq daily. Urgent follow up is not needed, but he should be seen soon by Dr. Bonner Puna (following his Echo).

## 2015-05-19 NOTE — Progress Notes (Signed)
   Subjective:    Patient ID: George Leon, male    DOB: 03-22-1956, 59 y.o.   MRN: [id number]  HPI 59 year old male with HTN, HLD, tobacco abuse, CAD s/p MI x 2, and COPD presents for follow up regarding lower extremity edema.   Patient was recently seen on 6/2 for evaluation of LE edema.  At that visit, given cardiac history and physical exam findings this was thought to be secondary to underlying CHF.  Laboratory studies were obtained and were remarkable for hyponatremia, hypokalemia, normal proBNP, normal renal function, and urine protein creatinine ratio of 0.45.  Echocardiogram was ordered but has not been completed. Additionally, patient was started on Lasix 20 mg daily.  Patient presents today for follow-up.  He continues to endorse lower extremity edema but states it is mildly improved.  He has increased his Lasix to 40 mg daily (of note,patient did increase further to 60 mg daily for 2 days).  He reports some mild associated shortness of breath.  He also endorses PND (but this is only happened a few times in his lifetime). He endorses orthopnea but this is stable (he attributes the number of pillows to his underlying back issues).  He denies any recent chest pain.  No dysuria or hematuria.  He has no other complaints at this time.    Social Hx - Former smoker.   Review of Systems  Constitutional: Negative for fever and chills.  Respiratory: Positive for shortness of breath.   Genitourinary: Negative.       Objective:   Physical Exam Filed Vitals:   05/18/15 1123  BP: 123/75  Pulse: 86  Temp: 97.9 F (36.6 C)   Vital signs reviewed.  Exam: General: chronically ll-appearing obese male in no acute distress. Cardiovascular: RRR. No murmurs, rubs, or gallops. Respiratory: CTAB. No rales, rhonchi, or wheeze. Abdomen: soft, nontender, nondistended. Extremities: 2+ - 3+ pitting lower abdomen edema bilaterally.  Extends proximally to the knees.     Assessment & Plan:  See  Problem List

## 2015-05-19 NOTE — Assessment & Plan Note (Signed)
This is a new problem for the patient. DDX: underlying renal disease, CHF.  Laboratory studies from last visit were reviewed and were notable for normal proBNP and marked proteinuria.  Given concern for CHF especially in the setting of his cardiac history, Echo was reordered today as it was not set up from last visit.  Patient is scheduled for 6/10. We will repeat urine protein/creatinine ratio to confirm proteinuria today (as this could be playing a role).  Also repeating BMP today to assess underlying renal function given recent start of Lasix.  Patient is to continue his Lasix dose of 40 mg daily while awaiting echo and lab studies.  Patient will follow up closely with his primary provider after the above work up.

## 2015-05-19 NOTE — Telephone Encounter (Signed)
Pt informed and appt made for Monday pm, as he had his echo today. George Leon, George Leon

## 2015-05-22 ENCOUNTER — Encounter (INDEPENDENT_AMBULATORY_CARE_PROVIDER_SITE_OTHER): Payer: Medicaid Other | Admitting: Ophthalmology

## 2015-05-22 ENCOUNTER — Encounter: Payer: Self-pay | Admitting: Family Medicine

## 2015-05-22 ENCOUNTER — Ambulatory Visit (INDEPENDENT_AMBULATORY_CARE_PROVIDER_SITE_OTHER): Payer: Medicaid Other | Admitting: Family Medicine

## 2015-05-22 DIAGNOSIS — H35371 Puckering of macula, right eye: Secondary | ICD-10-CM

## 2015-05-22 DIAGNOSIS — I5032 Chronic diastolic (congestive) heart failure: Secondary | ICD-10-CM | POA: Diagnosis not present

## 2015-05-22 DIAGNOSIS — H35033 Hypertensive retinopathy, bilateral: Secondary | ICD-10-CM

## 2015-05-22 DIAGNOSIS — I5030 Unspecified diastolic (congestive) heart failure: Secondary | ICD-10-CM | POA: Insufficient documentation

## 2015-05-22 DIAGNOSIS — R809 Proteinuria, unspecified: Secondary | ICD-10-CM | POA: Insufficient documentation

## 2015-05-22 DIAGNOSIS — I10 Essential (primary) hypertension: Secondary | ICD-10-CM

## 2015-05-22 DIAGNOSIS — H3531 Nonexudative age-related macular degeneration: Secondary | ICD-10-CM | POA: Diagnosis not present

## 2015-05-22 DIAGNOSIS — H3530 Unspecified macular degeneration: Secondary | ICD-10-CM | POA: Diagnosis not present

## 2015-05-22 DIAGNOSIS — M159 Polyosteoarthritis, unspecified: Secondary | ICD-10-CM

## 2015-05-22 DIAGNOSIS — M150 Primary generalized (osteo)arthritis: Secondary | ICD-10-CM

## 2015-05-22 DIAGNOSIS — H43813 Vitreous degeneration, bilateral: Secondary | ICD-10-CM | POA: Diagnosis not present

## 2015-05-22 DIAGNOSIS — I5031 Acute diastolic (congestive) heart failure: Secondary | ICD-10-CM | POA: Insufficient documentation

## 2015-05-22 MED ORDER — OCUVITE ADULT FORMULA PO CAPS: 1.0000 | ORAL_CAPSULE | Freq: Every day | ORAL | Status: AC

## 2015-05-22 NOTE — Assessment & Plan Note (Signed)
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 ?Adult minimal change disease vs. other.  Will place nonurgent referral to nephrology.

## 2015-05-22 NOTE — Progress Notes (Signed)
I was available as preceptor to resident for this patient's office visit.  

## 2015-05-22 NOTE — Patient Instructions (Addendum)
The kidney doctor or our office will get in touch with you about scheduling your appointment.  Let me know if your edema returns off of lasix.

## 2015-05-22 NOTE — Progress Notes (Signed)
Subjective: George Leon is a 59 y.o. male patient with history of HTN, HLD, tobacco abuse, CAD s/p MI x 2, and COPD presenting for follow up of lower extremity edema.  George Leon was seen on 6/2 for evaluation of LE edema thought to be due to CHF. Normal proBNP, normal renal function, and urine protein creatinine ratio of 0.45.Lasix 20mg  was started and he increased the dose to 40mg  by the time he followed up on 6/9 when LE edema improved. He again had proteinuria and was sent for echo. Echo performed 6/10 showed G1DD, EF 60% and no regional wall motion abnormalities. He has continued lasix and potassium but continues to have some pitting edema in both legs which is only very minimally improved by elevation. He denies changes in shortness of breath. He denies orthopnea and PND. He has no history of kidney problems, autoimmune diseases, or risk factors for HIV (though has not been screened for this). No chest pain.   - Former smoker  Objective: BP 138/88 HR 86 Temp: 98.97F RR 16/min Gen: Well-appearing 59 y.o. male in no distress CV: Regular rate, no murmur or gallop. 1+ pitting edema to upper calf bilaterally without leg pain.  Pulm: Clear  ECHOCARDIOGRAM 05/19/2015: The cavity size was normal. Wall thickness wasincreased in a pattern of mild LVH. Systolic function was normal.The estimated ejection fraction was 60%. GLPSS is reduced at-14%, global strain abnormalities. Doppler parameters areconsistent with abnormal left ventricular relaxation (grade 1diastolic dysfunction). The E/e&' ratio is <8, suggesting normalLV filling pressure.  Assessment/Plan: George Leon is a 59 y.o. male here for edema with sub-nephrotic range proteinuria, grade 1 diastolic dysfunction, and normal hepatic function.   See problem list for plan.

## 2015-05-22 NOTE — Assessment & Plan Note (Signed)
G1DD on echo 05/19/2015. On beta blocker, ARB, lasix. Not likely primary cause of LE edema.

## 2015-05-24 ENCOUNTER — Encounter: Payer: Self-pay | Admitting: Family Medicine

## 2015-06-01 ENCOUNTER — Encounter: Payer: Self-pay | Admitting: Family Medicine

## 2015-06-02 ENCOUNTER — Encounter: Payer: Self-pay | Admitting: Family Medicine

## 2015-06-19 ENCOUNTER — Other Ambulatory Visit: Payer: Self-pay | Admitting: Family Medicine

## 2015-06-19 ENCOUNTER — Encounter: Payer: Self-pay | Admitting: Family Medicine

## 2015-06-19 ENCOUNTER — Ambulatory Visit (INDEPENDENT_AMBULATORY_CARE_PROVIDER_SITE_OTHER): Payer: Self-pay | Admitting: Family Medicine

## 2015-06-19 VITALS — BP 154/103 | HR 106 | Temp 98.6°F | Ht 68.0 in | Wt 247.0 lb

## 2015-06-19 DIAGNOSIS — I739 Peripheral vascular disease, unspecified: Secondary | ICD-10-CM

## 2015-06-19 DIAGNOSIS — M159 Polyosteoarthritis, unspecified: Secondary | ICD-10-CM

## 2015-06-19 DIAGNOSIS — I10 Essential (primary) hypertension: Secondary | ICD-10-CM

## 2015-06-19 DIAGNOSIS — M150 Primary generalized (osteo)arthritis: Secondary | ICD-10-CM

## 2015-06-19 DIAGNOSIS — I872 Venous insufficiency (chronic) (peripheral): Secondary | ICD-10-CM | POA: Insufficient documentation

## 2015-06-19 MED ORDER — HYDROCODONE-ACETAMINOPHEN 5-325 MG PO TABS: 2.0000 | ORAL_TABLET | ORAL | Status: DC | PRN

## 2015-06-19 MED ORDER — SPIRONOLACTONE 25 MG PO TABS: 25.0000 mg | ORAL_TABLET | Freq: Every day | ORAL | Status: DC

## 2015-06-19 MED ORDER — MEDICAL COMPRESSION STOCKINGS MISC: 1.0000 | Freq: Once | Status: AC

## 2015-06-19 NOTE — Assessment & Plan Note (Addendum)
Uncontrolled on ARB, BB, diltiazem. Adverse reaction to thiazide (nephrolithiasis). Swelling makes me not want to go with non-DHP CCB nor clonidine. Will check Cr, K now, start spironolactone and follow up labs again later this week. ARB + spiro increases risk of hyperK and this with possible sequelae has been reviewed with him. He agrees to close monitoring of renal function. This should aid with swelling as well.

## 2015-06-19 NOTE — Assessment & Plan Note (Signed)
Palpable pulses so compression stockings ok.

## 2015-06-19 NOTE — Progress Notes (Signed)
Subjective: KORBEN CARCIONE is a 59 y.o. male presenting for chronic pain follow up and swelling.   His chronic pain is stable, no changes. He reports he is able to perform ADLs and remain more active because of the pain relief he gets from norco. No other prescribers.   He has taken all medications as directed, stopped lasix and potassium as directed and his legs are still swollen from the knee down. Swelling is symmetrical, bilateral, and improves but does not resolve with elevation. Work up to date has found no obvious etiology. Legs don't hurt. No personal or family history of clots.   He checks his BP at home and reports for the past 2 weeks it has been high. He remembers 150s / 90s. He denies chest pain, SOB, palpitations, syncope, hand swelling, changes in urination. Denies orthopnea, PND.   Of note, Pilar Plate reports that he will lose medicaid soon due to not having enough medical bills?? He asked if I could find anything to admit him to the hospital for. The obvious ethical principles this would violate were reviewed. I made it clear that this would constitute fraud and neither I nor any other provider will do this.   Objective: BP 154/103 mmHg  Pulse 106  Temp(Src) 98.6 F (37 C) (Oral)  Ht 5\' 8"  (1.727 m)  Wt 247 lb (112.038 kg)  BMI 37.56 kg/m2  SpO2 96% Gen: Obese, chronically ill-appearing  59 y.o. male in no distress walking slowly with a cane.  CV: Regular rate, no murmur or gallop. No JVD. 2+ pitting edema to upper calf bilaterally without leg pain. Palpable DP pulses bilaterally. No hair on legs.  Pulm: Clear, nonlabored. MSK: No swelling of knees. +joint line tenderness + crepitus  Assessment/Plan: LOXLEY SCHMALE is a 59 y.o. male here for chronic pain, HTN, and swelling.   See problem list for plan.

## 2015-06-19 NOTE — Assessment & Plan Note (Signed)
Most likely cause of LE edema given hx with negative work up. Recommended elevation every time he is able, compression stockings Rx provided.

## 2015-06-19 NOTE — Patient Instructions (Signed)
Start spironolactone. Come back for labs Friday. We'll be in touch w/my chart.

## 2015-06-20 LAB — COMPREHENSIVE METABOLIC PANEL
ALK PHOS: 62 U/L (ref 39–117)
ALT: 10 U/L (ref 0–53)
AST: 15 U/L (ref 0–37)
Albumin: 3.6 g/dL (ref 3.5–5.2)
BUN: 2 mg/dL — ABNORMAL LOW (ref 6–23)
CALCIUM: 8.9 mg/dL (ref 8.4–10.5)
CHLORIDE: 103 meq/L (ref 96–112)
CO2: 27 mEq/L (ref 19–32)
Creat: 0.55 mg/dL (ref 0.50–1.35)
Glucose, Bld: 91 mg/dL (ref 70–99)
Potassium: 3.1 mEq/L — ABNORMAL LOW (ref 3.5–5.3)
SODIUM: 139 meq/L (ref 135–145)
Total Bilirubin: 0.5 mg/dL (ref 0.2–1.2)
Total Protein: 6.2 g/dL (ref 6.0–8.3)

## 2015-06-21 ENCOUNTER — Other Ambulatory Visit: Payer: Self-pay | Admitting: Family Medicine

## 2015-06-21 DIAGNOSIS — Z5181 Encounter for therapeutic drug level monitoring: Secondary | ICD-10-CM | POA: Insufficient documentation

## 2015-06-23 ENCOUNTER — Other Ambulatory Visit: Payer: Self-pay | Admitting: *Deleted

## 2015-06-23 DIAGNOSIS — J449 Chronic obstructive pulmonary disease, unspecified: Secondary | ICD-10-CM

## 2015-06-23 MED ORDER — ESOMEPRAZOLE MAGNESIUM 40 MG PO CPDR: 40.0000 mg | DELAYED_RELEASE_CAPSULE | Freq: Every day | ORAL | Status: DC | PRN

## 2015-06-23 MED ORDER — ALBUTEROL SULFATE HFA 108 (90 BASE) MCG/ACT IN AERS: 1.0000 | INHALATION_SPRAY | RESPIRATORY_TRACT | Status: DC | PRN

## 2015-06-26 ENCOUNTER — Other Ambulatory Visit: Payer: Self-pay | Admitting: Family Medicine

## 2015-06-26 DIAGNOSIS — F411 Generalized anxiety disorder: Secondary | ICD-10-CM

## 2015-06-26 MED ORDER — DIAZEPAM 5 MG PO TABS
5.0000 mg | ORAL_TABLET | Freq: Two times a day (BID) | ORAL | Status: DC | PRN
Start: 2015-06-26 — End: 2015-09-21

## 2015-06-26 NOTE — Telephone Encounter (Signed)
Refill request for Valium.  

## 2015-06-27 ENCOUNTER — Encounter: Payer: Self-pay | Admitting: Family Medicine

## 2015-06-30 ENCOUNTER — Other Ambulatory Visit: Payer: Self-pay

## 2015-06-30 DIAGNOSIS — Z5181 Encounter for therapeutic drug level monitoring: Secondary | ICD-10-CM

## 2015-06-30 LAB — BASIC METABOLIC PANEL
BUN: 5 mg/dL — ABNORMAL LOW (ref 6–23)
CALCIUM: 8.6 mg/dL (ref 8.4–10.5)
CO2: 26 meq/L (ref 19–32)
Chloride: 103 mEq/L (ref 96–112)
Creat: 0.59 mg/dL (ref 0.50–1.35)
Glucose, Bld: 99 mg/dL (ref 70–99)
Potassium: 3.3 mEq/L — ABNORMAL LOW (ref 3.5–5.3)
Sodium: 140 mEq/L (ref 135–145)

## 2015-06-30 NOTE — Progress Notes (Signed)
BMP DONE TODAY Saverio Kader 

## 2015-07-03 ENCOUNTER — Telehealth: Payer: Self-pay | Admitting: Family Medicine

## 2015-07-03 NOTE — Telephone Encounter (Signed)
Improving K on ARB and spiro without hyperK. Preserved renal function.

## 2015-07-03 NOTE — Telephone Encounter (Signed)
Spoke with pt.  Happy about results. Will make appt around the 11th.  Pt told me his ankles are still swollen. George Leon.CMA

## 2015-07-04 ENCOUNTER — Encounter: Payer: Self-pay | Admitting: Family Medicine

## 2015-07-13 ENCOUNTER — Ambulatory Visit (INDEPENDENT_AMBULATORY_CARE_PROVIDER_SITE_OTHER): Payer: Self-pay | Admitting: Family Medicine

## 2015-07-13 VITALS — BP 170/99 | HR 95 | Temp 98.8°F | Ht 68.0 in | Wt 249.6 lb

## 2015-07-13 DIAGNOSIS — M1712 Unilateral primary osteoarthritis, left knee: Secondary | ICD-10-CM

## 2015-07-13 DIAGNOSIS — M1711 Unilateral primary osteoarthritis, right knee: Secondary | ICD-10-CM

## 2015-07-13 MED ORDER — METHYLPREDNISOLONE ACETATE 80 MG/ML IJ SUSP
80.0000 mg | Freq: Once | INTRAMUSCULAR | Status: AC
  Administered 2015-07-13: 80 mg via INTRA_ARTICULAR

## 2015-07-13 NOTE — Progress Notes (Signed)
Subjective: George Leon is a 59 y.o. male presenting for chronic pain follow up and swelling.   Knees have been more painful L > R over the past few months since his last steroid injections. These have provided significant relief in the past, improving his mobility. No giving out, locking, injury, falls. No hip pain. Has continued with medications with partial but still unsatisfactory results.   Objective: BP 170/99 mmHg  Pulse 95  Temp(Src) 98.8 F (37.1 C) (Oral)  Ht 5\' 8"  (1.727 m)  Wt 249 lb 9.6 oz (113.218 kg)  BMI 37.96 kg/m2 Gen: Obese, chronically ill-appearing  59 y.o. male in no distress walking slowly with a cane.  CV: Regular rate, no murmur or gallop. No JVD. 2+ pitting edema to upper calf bilaterally without leg pain. Palpable DP pulses bilaterally. No hair on legs.  Pulm: Clear, nonlabored. Knees: Small effusion without erythema or warmth bilaterally. + crepitus with full strength and ROM. Negative anterior drawer, thesally and McMurray also negative.   BILATERAL KNEE INJECTIONS: Patient was given informed consent, signed copy in the chart. Appropriate time out was taken. Area prepped in usual sterile fashion. 1 cc of methylprednisolone 80 mg/ml plus  4 cc of 1% lidocaine without epinephrine was injected into the right knee joint using an infero-lateral approach. The process was repeat on the left knee with an infero-medial approach. The patient tolerated the procedure well. There were no complications. Post procedure instructions were given.  Assessment/Plan: George Leon is a 59 y.o. male here for chronic knee pain, presumably osteoarthritis.   See problem list for plan.

## 2015-07-14 ENCOUNTER — Encounter: Payer: Self-pay | Admitting: Family Medicine

## 2015-07-14 NOTE — Patient Instructions (Signed)
Knee Injection Joint injections are shots. Your caregiver will place a needle into your knee joint. The needle is used to put medicine into the joint. These shots can be used to help treat different painful knee conditions such as osteoarthritis, bursitis, local flare-ups of rheumatoid arthritis, and pseudogout. Anti-inflammatory medicines such as corticosteroids and anesthetics are the most common medicines used for joint and soft tissue injections.  PROCEDURE  The skin over the kneecap will be cleaned with an antiseptic solution.  Your caregiver will inject a small amount of a local anesthetic (a medicine like Novocaine) just under the skin in the area that was cleaned.  After the area becomes numb, a second injection is done. This second injection usually includes an anesthetic and an anti-inflammatory medicine called a steroid or cortisone. The needle is carefully placed in between the kneecap and the knee, and the medicine is injected into the joint space.  After the injection is done, the needle is removed. Your caregiver may place a bandage over the injection site. The whole procedure takes no more than a couple of minutes. BEFORE THE PROCEDURE  Wash all of the skin around the entire knee area. Try to remove any loose, scaling skin. There is no other specific preparation necessary unless advised otherwise by your caregiver. LET YOUR CAREGIVER KNOW ABOUT:   Allergies.  Medications taken including herbs, eye drops, over the counter medications, and creams.  Use of steroids (by mouth or creams).  Possible pregnancy, if applicable.  Previous problems with anesthetics or Novocaine.  History of blood clots (thrombophlebitis).  History of bleeding or blood problems.  Previous surgery.  Other health problems. RISKS AND COMPLICATIONS Side effects from cortisone shots are rare. They include:   Slight bruising of the skin.  Shrinkage of the normal fatty tissue under the skin where  the shot was given.  Increase in pain after the shot.  Infection.  Weakening of tendons or tendon rupture.  Allergic reaction to the medicine.  Diabetics may have a temporary increase in their blood sugar after a shot.  Cortisone can temporarily weaken the immune system. While receiving these shots, you should not get certain vaccines. Also, avoid contact with anyone who has chickenpox or measles. Especially if you have never had these diseases or have not been previously immunized. Your immune system may not be strong enough to fight off the infection while the cortisone is in your system. AFTER THE PROCEDURE   You can go home after the procedure.  You may need to put ice on the joint 15-20 minutes every 3 or 4 hours until the pain goes away.  You may need to put an elastic bandage on the joint. HOME CARE INSTRUCTIONS   Only take over-the-counter or prescription medicines for pain, discomfort, or fever as directed by your caregiver.  You should avoid stressing the joint. Unless advised otherwise, avoid activities that put a lot of pressure on a knee joint, such as:  Jogging.  Bicycling.  Recreational climbing.  Hiking.  Laying down and elevating the leg/knee above the level of your heart can help to minimize swelling. SEEK MEDICAL CARE IF:   You have repeated or worsening swelling.  There is drainage from the puncture area.  You develop red streaking that extends above or below the site where the needle was inserted. SEEK IMMEDIATE MEDICAL CARE IF:   You develop a fever.  You have pain that gets worse even though you are taking pain medicine.  The area is   red and warm, and you have trouble moving the joint. MAKE SURE YOU:   Understand these instructions.  Will watch your condition.  Will get help right away if you are not doing well or get worse. Document Released: 02/16/2007 Document Revised: 02/17/2012 Document Reviewed: 11/13/2007 ExitCare Patient  Information 2015 [employer]. This information is not intended to replace advice given to you by your health care provider. Make sure you discuss any questions you have with your health care provider.  

## 2015-07-14 NOTE — Assessment & Plan Note (Signed)
Injection today, continue exercises, oral analgesics, get repeat XR.

## 2015-07-24 ENCOUNTER — Telehealth: Payer: Self-pay | Admitting: Family Medicine

## 2015-07-24 ENCOUNTER — Encounter: Payer: Self-pay | Admitting: Family Medicine

## 2015-07-24 DIAGNOSIS — I10 Essential (primary) hypertension: Secondary | ICD-10-CM

## 2015-07-24 NOTE — Telephone Encounter (Signed)
I guess he wants these sent to [facility]. Will transition benicar to cozaar (losartan) which is about $22 for a 90-day supply at [facility]. Crestor is generic but $168 for 90-day supply. Next less potent is lipitor which is < $20 / month for 90-day supply. Simvastatin is not optimal for him but the generic is < $20 for 90-day supply at its highest dose. I will discuss these options with him.

## 2015-07-24 NOTE — Telephone Encounter (Signed)
Medication is too expensive (crestor and Benicar), would like to know if there are other options or if there are samples that we can provide. Thank you, Fonda Kinder, ASA

## 2015-07-26 MED ORDER — LOSARTAN POTASSIUM 50 MG PO TABS: 50.0000 mg | ORAL_TABLET | Freq: Every day | ORAL | Status: DC

## 2015-07-26 MED ORDER — ATORVASTATIN CALCIUM 80 MG PO TABS: 80.0000 mg | ORAL_TABLET | Freq: Every day | ORAL | Status: DC

## 2015-09-13 ENCOUNTER — Ambulatory Visit (HOSPITAL_COMMUNITY)
Admission: RE | Admit: 2015-09-13 | Discharge: 2015-09-13 | Disposition: A | Discharge status: Home / Self Care | Payer: Self-pay | Source: Ambulatory Visit | Attending: Family Medicine | Admitting: Family Medicine

## 2015-09-13 DIAGNOSIS — M1712 Unilateral primary osteoarthritis, left knee: Secondary | ICD-10-CM | POA: Insufficient documentation

## 2015-09-13 DIAGNOSIS — M25562 Pain in left knee: Secondary | ICD-10-CM | POA: Insufficient documentation

## 2015-09-13 DIAGNOSIS — M25561 Pain in right knee: Secondary | ICD-10-CM | POA: Insufficient documentation

## 2015-09-13 DIAGNOSIS — M1711 Unilateral primary osteoarthritis, right knee: Secondary | ICD-10-CM

## 2015-09-19 ENCOUNTER — Ambulatory Visit: Payer: Self-pay | Admitting: Family Medicine

## 2015-09-21 ENCOUNTER — Encounter: Payer: Self-pay | Admitting: Family Medicine

## 2015-09-21 ENCOUNTER — Ambulatory Visit (INDEPENDENT_AMBULATORY_CARE_PROVIDER_SITE_OTHER): Payer: Self-pay | Admitting: Family Medicine

## 2015-09-21 VITALS — BP 161/86 | HR 75 | Temp 98.0°F | Ht 68.0 in | Wt 248.0 lb

## 2015-09-21 DIAGNOSIS — Z7189 Other specified counseling: Secondary | ICD-10-CM

## 2015-09-21 DIAGNOSIS — M7021 Olecranon bursitis, right elbow: Secondary | ICD-10-CM

## 2015-09-21 DIAGNOSIS — F411 Generalized anxiety disorder: Secondary | ICD-10-CM

## 2015-09-21 DIAGNOSIS — M150 Primary generalized (osteo)arthritis: Secondary | ICD-10-CM

## 2015-09-21 DIAGNOSIS — M159 Polyosteoarthritis, unspecified: Secondary | ICD-10-CM

## 2015-09-21 DIAGNOSIS — G8929 Other chronic pain: Secondary | ICD-10-CM

## 2015-09-21 DIAGNOSIS — I10 Essential (primary) hypertension: Secondary | ICD-10-CM

## 2015-09-21 MED ORDER — HYDROCODONE-ACETAMINOPHEN 5-325 MG PO TABS: 2.0000 | ORAL_TABLET | ORAL | Status: DC | PRN

## 2015-09-21 MED ORDER — AMLODIPINE BESYLATE 10 MG PO TABS
5.0000 mg | ORAL_TABLET | Freq: Every day | ORAL | Status: DC
Start: 2015-09-21 — End: 2016-03-06

## 2015-09-21 MED ORDER — DIAZEPAM 5 MG PO TABS: 5.0000 mg | ORAL_TABLET | Freq: Two times a day (BID) | ORAL | Status: DC | PRN

## 2015-09-21 NOTE — Assessment & Plan Note (Signed)
Not infected by exam or history. Provided information on condition. Will readdress at next office visit.

## 2015-09-21 NOTE — Patient Instructions (Addendum)
Thank you for coming in today!  - Take valium only as needed and pain medications very carefully.  - Start taking amlodipine (norvasc) for blood pressure and continue wearing compression stockings.  - If the bursitis gets really bothersome, schedule another visit and we'll discuss other treatment options.   Our clinic's number is [phone]. Feel free to call any time with questions or concerns. We will answer any questions after hours with our 24-hour emergency line at that number as well.   - Dr. Drue Stager BURSITIS Nonsurgical Treatment If your doctor suspects that bursitis is due to an infection, he or she may recommend aspirating (removing the fluid from) the bursa with a needle. This is commonly performed as an office procedure. Fluid removal helps relieve symptoms and gives your doctor a sample that can be looked at in a laboratory to identify if any bacteria are growing. This also lets your doctor know if a specific antibiotic is needed to fight the infection.  Your doctor may prescribe antibiotics before the exact type of infection is identified. This is done to prevent the infection from progressing. The antibiotic that your doctor prescribes at this point will treat a number of possible infections.  If the bursitis is not from an infection, it is treated with a number of options.  Elbow pads. An elbow pad may be used to cushion your elbow. Activity changes. Avoid activities that cause direct pressure to your swollen elbow. Medications. Oral medications such as ibuprofen or other anti-inflammatories may be used to reduce swelling and relieve your symptoms. If swelling and pain do not respond to these measures after 3 to 4 weeks, your doctor may recommend removing fluid from the bursa and injecting a corticosteroid medication into the bursa. The steroid medication is an anti-inflammatory drug that is stronger than the medication that can be taken by mouth. Corticosteroid  injections usually work well to relieve pain and swelling. However, symptoms can return.   Your doctor may remove fluid from the swollen bursa to check for infection, or to prepare the bursa for a corticosteroid injection.  Surgery for infected bursa. If the bursa is infected and it does not improve with antibiotics or by removing fluid from the elbow, surgery to remove the entire bursa may be needed. This is often an inpatient procedure, meaning you will need to stay overnight in the hospital. This surgery may be combined with further use of oral or intravenous antibiotics.  The bursa usually grows back as a non-inflamed, normally functioning bursa over a period of several months.  Surgery for noninfected bursa. If elbow bursitis is not a result of infection, surgery may still be needed if nonsurgical treatments do not work. In this case, surgery to remove the bursa is usually performed as an outpatient procedure. The surgery does not disturb any muscle, ligament, or joint structures.  Recovery. Your doctor will apply a splint to your arm after the procedure to protect your skin. In most cases, casts or prolonged immobilization are not necessary.  Although formal physical therapy after surgery is not usually needed, your doctor will recommend specific exercises to improve your range of motion. These are typically permitted within a few days of the surgery.  Your skin should be well healed within 10 to 14 days after the surgery, and after 3 to 4 weeks, your doctor may allow you to fully use your elbow. Your elbow may need to be padded or protected for several months to prevent reinjury.

## 2015-09-21 NOTE — Progress Notes (Signed)
Subjective: George Leon is a 59 y.o. male patient of mine, again accompanied by his wife, presenting for follow up of knee pain.  His knee symptoms have not changed and continue to limit ambulation (even with a cane). He endorses improvement in all pain with prescribed analgesics. No Rx's from another provider, using single pharmacy. He is also very anxious when out of the house which he treats with valium. He is "abundantly clear" on the risks of the coadministration of these medications but believes that they both have benefits (increased activity and socialization) greater than risks.   He picked up spironolactone when prescribed but stopped taking it. He can't remember why. 100% compliance with other medications. Recollects being on norvasc for years without issues. Has been wearing compression stockings for leg swelling.   - ROS: Denies fever, headache, cough, CP, palpitations, SOB, abdominal pain, N/V/D, blood in stool, change in bladder habits, myalgias, arthralgias, and rash.   Objective: BP 161/86 mmHg  Pulse 75  Temp(Src) 98 F (36.7 C) (Oral)  Ht 5\' 8"  (1.727 m)  Wt 248 lb (112.492 kg)  BMI 37.72 kg/m2 Gen: Pleasant well-appearing 59 y.o. male in no distress CV: Regular rate, no murmur; radial, DP and PT pulses 2+ bilaterally; trace LE edema with compression stockings on, no JVD, cap refill < 2 sec. Pulm: Non-labored breathing ambient air; CTAB, no wheezes or crackles Knees: Small effusion without erythema or warmth bilaterally. + crepitus with full strength and ROM. Negative anterior drawer, thesally and McMurray also negative.  Elbow: Enlarged olecranon bursa without warmth.    09/13/2015  R knee XR: WNL L knee XR: Mild to moderate medial compartment joint space loss. Other joint spaces appear preserved. The patella is intact. No joint effusion. Bone mineralization is within normal limits. Mild calcified atherosclerosis in the visible left lower  extremity. IMPRESSION: Medial compartment joint space loss. No joint effusion or acute osseous abnormality identified.  Assessment/Plan: George Leon is a 59 y.o. male here for follow up of arthritis films and chronic pain management.  HYPERTENSION, BENIGN Uncontrolled, though has not been taking spironolactone. Asymptomatic. Will start amlodipine ($4 with goodrx coupon at [facility]).   Osteoarthritis of multiple joints Knee films reviewed in office today, showing left worse than right medial joint space loss consistent with OA and possible degenerative meniscus that would likely not warrant surgical correction. Will continue with chronic pain management. No injection today.   Olecranon bursitis of right elbow Not infected by exam or history. Provided information on condition. Will readdress at next office visit.   Encounter for chronic pain management Needs UDS, re-signing of pain contract at next office visit. Medications refilled with continued warnings of side effects including death especially with coadministration with benzodiazepines.

## 2015-09-21 NOTE — Assessment & Plan Note (Signed)
Uncontrolled, though has not been taking spironolactone. Asymptomatic. Will start amlodipine ($4 with goodrx coupon at [facility]).

## 2015-09-21 NOTE — Assessment & Plan Note (Signed)
Knee films reviewed in office today, showing left worse than right medial joint space loss consistent with OA and possible degenerative meniscus that would likely not warrant surgical correction. Will continue with chronic pain management. No injection today.

## 2015-09-21 NOTE — Assessment & Plan Note (Signed)
Needs UDS, re-signing of pain contract at next office visit. Medications refilled with continued warnings of side effects including death especially with coadministration with benzodiazepines.

## 2015-11-25 ENCOUNTER — Encounter (HOSPITAL_COMMUNITY): Payer: Self-pay | Admitting: Emergency Medicine

## 2015-11-25 ENCOUNTER — Inpatient Hospital Stay (HOSPITAL_COMMUNITY)
Admission: EM | Admit: 2015-11-25 | Discharge: 2015-12-01 | DRG: 207 | Disposition: A | Discharge status: Rehabilitation Facility | Payer: Medicaid Other | Attending: Emergency Medicine | Admitting: Emergency Medicine

## 2015-11-25 ENCOUNTER — Inpatient Hospital Stay (HOSPITAL_COMMUNITY): Payer: Medicaid Other

## 2015-11-25 ENCOUNTER — Emergency Department (HOSPITAL_COMMUNITY): Payer: Medicaid Other

## 2015-11-25 DIAGNOSIS — T380X5A Adverse effect of glucocorticoids and synthetic analogues, initial encounter: Secondary | ICD-10-CM | POA: Diagnosis not present

## 2015-11-25 DIAGNOSIS — K219 Gastro-esophageal reflux disease without esophagitis: Secondary | ICD-10-CM | POA: Diagnosis present

## 2015-11-25 DIAGNOSIS — Z789 Other specified health status: Secondary | ICD-10-CM | POA: Diagnosis not present

## 2015-11-25 DIAGNOSIS — F17200 Nicotine dependence, unspecified, uncomplicated: Secondary | ICD-10-CM | POA: Insufficient documentation

## 2015-11-25 DIAGNOSIS — R000 Tachycardia, unspecified: Secondary | ICD-10-CM | POA: Diagnosis present

## 2015-11-25 DIAGNOSIS — J189 Pneumonia, unspecified organism: Secondary | ICD-10-CM | POA: Diagnosis present

## 2015-11-25 DIAGNOSIS — R0902 Hypoxemia: Secondary | ICD-10-CM | POA: Diagnosis present

## 2015-11-25 DIAGNOSIS — J9622 Acute and chronic respiratory failure with hypercapnia: Secondary | ICD-10-CM

## 2015-11-25 DIAGNOSIS — F419 Anxiety disorder, unspecified: Secondary | ICD-10-CM | POA: Diagnosis present

## 2015-11-25 DIAGNOSIS — Z0189 Encounter for other specified special examinations: Secondary | ICD-10-CM | POA: Diagnosis not present

## 2015-11-25 DIAGNOSIS — J9600 Acute respiratory failure, unspecified whether with hypoxia or hypercapnia: Secondary | ICD-10-CM | POA: Insufficient documentation

## 2015-11-25 DIAGNOSIS — I2510 Atherosclerotic heart disease of native coronary artery without angina pectoris: Secondary | ICD-10-CM | POA: Insufficient documentation

## 2015-11-25 DIAGNOSIS — J440 Chronic obstructive pulmonary disease with acute lower respiratory infection: Secondary | ICD-10-CM | POA: Diagnosis present

## 2015-11-25 DIAGNOSIS — I252 Old myocardial infarction: Secondary | ICD-10-CM

## 2015-11-25 DIAGNOSIS — Z978 Presence of other specified devices: Secondary | ICD-10-CM

## 2015-11-25 DIAGNOSIS — G894 Chronic pain syndrome: Secondary | ICD-10-CM | POA: Diagnosis not present

## 2015-11-25 DIAGNOSIS — Z6838 Body mass index (BMI) 38.0-38.9, adult: Secondary | ICD-10-CM

## 2015-11-25 DIAGNOSIS — J441 Chronic obstructive pulmonary disease with (acute) exacerbation: Secondary | ICD-10-CM | POA: Diagnosis present

## 2015-11-25 DIAGNOSIS — R739 Hyperglycemia, unspecified: Secondary | ICD-10-CM | POA: Diagnosis present

## 2015-11-25 DIAGNOSIS — J9602 Acute respiratory failure with hypercapnia: Secondary | ICD-10-CM | POA: Diagnosis present

## 2015-11-25 DIAGNOSIS — J9621 Acute and chronic respiratory failure with hypoxia: Secondary | ICD-10-CM | POA: Diagnosis not present

## 2015-11-25 DIAGNOSIS — R5381 Other malaise: Secondary | ICD-10-CM | POA: Diagnosis not present

## 2015-11-25 DIAGNOSIS — J9690 Respiratory failure, unspecified, unspecified whether with hypoxia or hypercapnia: Secondary | ICD-10-CM | POA: Diagnosis present

## 2015-11-25 DIAGNOSIS — E871 Hypo-osmolality and hyponatremia: Secondary | ICD-10-CM | POA: Diagnosis present

## 2015-11-25 DIAGNOSIS — I959 Hypotension, unspecified: Secondary | ICD-10-CM | POA: Diagnosis not present

## 2015-11-25 DIAGNOSIS — Z7982 Long term (current) use of aspirin: Secondary | ICD-10-CM | POA: Diagnosis not present

## 2015-11-25 DIAGNOSIS — J9601 Acute respiratory failure with hypoxia: Principal | ICD-10-CM | POA: Insufficient documentation

## 2015-11-25 DIAGNOSIS — R030 Elevated blood-pressure reading, without diagnosis of hypertension: Secondary | ICD-10-CM | POA: Diagnosis not present

## 2015-11-25 DIAGNOSIS — F17210 Nicotine dependence, cigarettes, uncomplicated: Secondary | ICD-10-CM | POA: Insufficient documentation

## 2015-11-25 DIAGNOSIS — I5033 Acute on chronic diastolic (congestive) heart failure: Secondary | ICD-10-CM | POA: Diagnosis present

## 2015-11-25 DIAGNOSIS — E669 Obesity, unspecified: Secondary | ICD-10-CM | POA: Diagnosis present

## 2015-11-25 DIAGNOSIS — G4733 Obstructive sleep apnea (adult) (pediatric): Secondary | ICD-10-CM | POA: Diagnosis present

## 2015-11-25 DIAGNOSIS — Z720 Tobacco use: Secondary | ICD-10-CM | POA: Insufficient documentation

## 2015-11-25 DIAGNOSIS — E876 Hypokalemia: Secondary | ICD-10-CM | POA: Diagnosis present

## 2015-11-25 DIAGNOSIS — F411 Generalized anxiety disorder: Secondary | ICD-10-CM | POA: Diagnosis not present

## 2015-11-25 DIAGNOSIS — J449 Chronic obstructive pulmonary disease, unspecified: Secondary | ICD-10-CM | POA: Diagnosis not present

## 2015-11-25 DIAGNOSIS — G8929 Other chronic pain: Secondary | ICD-10-CM | POA: Diagnosis present

## 2015-11-25 DIAGNOSIS — I10 Essential (primary) hypertension: Secondary | ICD-10-CM | POA: Diagnosis present

## 2015-11-25 DIAGNOSIS — R0600 Dyspnea, unspecified: Secondary | ICD-10-CM | POA: Diagnosis not present

## 2015-11-25 LAB — BASIC METABOLIC PANEL
Anion gap: 10 (ref 5–15)
BUN: 5 mg/dL — AB (ref 6–20)
CO2: 30 mmol/L (ref 22–32)
Calcium: 8.7 mg/dL — ABNORMAL LOW (ref 8.9–10.3)
Chloride: 92 mmol/L — ABNORMAL LOW (ref 101–111)
Creatinine, Ser: 0.73 mg/dL (ref 0.61–1.24)
GFR calc Af Amer: >60 mL/min (ref >60)
GLUCOSE: 125 mg/dL — AB (ref 65–99)
POTASSIUM: 3.4 mmol/L — AB (ref 3.5–5.1)
Sodium: 132 mmol/L — ABNORMAL LOW (ref 135–145)

## 2015-11-25 LAB — URINALYSIS, ROUTINE W REFLEX MICROSCOPIC
GLUCOSE, UA: NEGATIVE mg/dL
Ketones, ur: 15 mg/dL — AB
Leukocytes, UA: NEGATIVE
Nitrite: NEGATIVE
PH: 6 (ref 5.0–8.0)
Specific Gravity, Urine: 1.017 (ref 1.005–1.030)

## 2015-11-25 LAB — I-STAT ARTERIAL BLOOD GAS, ED
BICARBONATE: 31.4 meq/L — AB (ref 20.0–24.0)
O2 Saturation: 88 %
PCO2 ART: 81.1 mmHg — AB (ref 35.0–45.0)
PH ART: 7.196 — AB (ref 7.350–7.450)
PO2 ART: 69 mmHg — AB (ref 80.0–100.0)
TCO2: 34 mmol/L (ref 0–100)

## 2015-11-25 LAB — POCT I-STAT 3, ART BLOOD GAS (G3+)
Acid-Base Excess: 3 mmol/L — ABNORMAL HIGH (ref 0.0–2.0)
Bicarbonate: 31.8 mEq/L — ABNORMAL HIGH (ref 20.0–24.0)
Bicarbonate: 29.2 mEq/L — ABNORMAL HIGH (ref 20.0–24.0)
O2 Saturation: 100 %
O2 Saturation: 99 %
PCO2 ART: 91.9 mmHg — AB (ref 35.0–45.0)
PCO2 ART: 52.1 mmHg — AB (ref 35.0–45.0)
Patient temperature: 98.5
Patient temperature: 99.5
TCO2: 35 mmol/L (ref 0–100)
TCO2: 31 mmol/L (ref 0–100)
pH, Arterial: 7.147 — CL (ref 7.350–7.450)
pH, Arterial: 7.359 (ref 7.350–7.450)
pO2, Arterial: 349 mmHg — ABNORMAL HIGH (ref 80.0–100.0)
pO2, Arterial: 129 mmHg — ABNORMAL HIGH (ref 80.0–100.0)

## 2015-11-25 LAB — CBC WITH DIFFERENTIAL/PLATELET
Basophils Absolute: 0 10*3/uL (ref 0.0–0.1)
Basophils Relative: 0 %
EOS PCT: 0 %
Eosinophils Absolute: 0 10*3/uL (ref 0.0–0.7)
HEMATOCRIT: 41.4 % (ref 39.0–52.0)
Hemoglobin: 13 g/dL (ref 13.0–17.0)
LYMPHS ABS: 2 10*3/uL (ref 0.7–4.0)
LYMPHS PCT: 8 %
MCH: 29.9 pg (ref 26.0–34.0)
MCHC: 31.4 g/dL (ref 30.0–36.0)
MCV: 95.2 fL (ref 78.0–100.0)
MONO ABS: 2.8 10*3/uL — AB (ref 0.1–1.0)
Monocytes Relative: 12 %
NEUTROS ABS: 18.8 10*3/uL — AB (ref 1.7–7.7)
Neutrophils Relative %: 80 %
PLATELETS: 249 10*3/uL (ref 150–400)
RBC: 4.35 MIL/uL (ref 4.22–5.81)
RDW: 14.2 % (ref 11.5–15.5)
WBC: 23.6 10*3/uL — ABNORMAL HIGH (ref 4.0–10.5)

## 2015-11-25 LAB — GLUCOSE, CAPILLARY
Glucose-Capillary: 167 mg/dL — ABNORMAL HIGH (ref 65–99)
Glucose-Capillary: 161 mg/dL — ABNORMAL HIGH (ref 65–99)

## 2015-11-25 LAB — TRIGLYCERIDES: TRIGLYCERIDES: 69 mg/dL (ref <150)

## 2015-11-25 LAB — TROPONIN I
TROPONIN I: 0.08 ng/mL — AB (ref <0.031)
Troponin I: 0.21 ng/mL — ABNORMAL HIGH (ref <0.031)
Troponin I: 0.07 ng/mL — ABNORMAL HIGH (ref <0.031)

## 2015-11-25 LAB — URINE MICROSCOPIC-ADD ON

## 2015-11-25 LAB — INFLUENZA PANEL BY PCR (TYPE A & B)
H1N1FLUPCR: NOT DETECTED
INFLAPCR: NEGATIVE
INFLBPCR: NEGATIVE

## 2015-11-25 LAB — PROCALCITONIN: PROCALCITONIN: 1.13 ng/mL

## 2015-11-25 LAB — LACTIC ACID, PLASMA: LACTIC ACID, VENOUS: 1.3 mmol/L (ref 0.5–2.0)

## 2015-11-25 LAB — I-STAT CG4 LACTIC ACID, ED: LACTIC ACID, VENOUS: 1.41 mmol/L (ref 0.5–2.0)

## 2015-11-25 LAB — BRAIN NATRIURETIC PEPTIDE: B Natriuretic Peptide: 134.7 pg/mL — ABNORMAL HIGH (ref 0.0–100.0)

## 2015-11-25 LAB — MRSA PCR SCREENING: MRSA by PCR: NEGATIVE

## 2015-11-25 MED ORDER — ALBUTEROL SULFATE (2.5 MG/3ML) 0.083% IN NEBU
2.5000 mg | INHALATION_SOLUTION | RESPIRATORY_TRACT | Status: DC | PRN
  Filled 2015-11-25: qty 3

## 2015-11-25 MED ORDER — MONTELUKAST SODIUM 4 MG PO CHEW
4.0000 mg | CHEWABLE_TABLET | Freq: Every day | ORAL | Status: DC
  Administered 2015-11-26 – 2015-11-27 (×2): 4 mg
  Filled 2015-11-25 (×3): qty 1

## 2015-11-25 MED ORDER — ANTISEPTIC ORAL RINSE SOLUTION (CORINZ)
7.0000 mL | Freq: Four times a day (QID) | OROMUCOSAL | Status: DC
  Administered 2015-11-26 – 2015-12-01 (×23): 7 mL via OROMUCOSAL

## 2015-11-25 MED ORDER — IPRATROPIUM-ALBUTEROL 0.5-2.5 (3) MG/3ML IN SOLN
3.0000 mL | Freq: Four times a day (QID) | RESPIRATORY_TRACT | Status: DC
  Administered 2015-11-25 – 2015-11-30 (×22): 3 mL via RESPIRATORY_TRACT
  Filled 2015-11-25 (×23): qty 3

## 2015-11-25 MED ORDER — ALBUTEROL (5 MG/ML) CONTINUOUS INHALATION SOLN
10.0000 mg/h | INHALATION_SOLUTION | Freq: Once | RESPIRATORY_TRACT | Status: AC
  Administered 2015-11-25: 10 mg/h via RESPIRATORY_TRACT
  Filled 2015-11-25: qty 20

## 2015-11-25 MED ORDER — VITAL HIGH PROTEIN PO LIQD
1000.0000 mL | ORAL | Status: DC
  Administered 2015-11-25 – 2015-11-26 (×5)
  Filled 2015-11-25 (×3): qty 1000

## 2015-11-25 MED ORDER — PANTOPRAZOLE SODIUM 40 MG IV SOLR: 40.0000 mg | Freq: Every day | INTRAVENOUS | Status: DC

## 2015-11-25 MED ORDER — DEXTROSE 5 % IV SOLN
1.0000 g | INTRAVENOUS | Status: DC
  Administered 2015-11-26 – 2015-11-30 (×5): 1 g via INTRAVENOUS
  Filled 2015-11-25 (×7): qty 10

## 2015-11-25 MED ORDER — BISACODYL 10 MG RE SUPP: 10.0000 mg | Freq: Every day | RECTAL | Status: DC | PRN

## 2015-11-25 MED ORDER — INSULIN ASPART 100 UNIT/ML ~~LOC~~ SOLN
0.0000 [IU] | SUBCUTANEOUS | Status: DC
  Administered 2015-11-25 (×2): 4 [IU] via SUBCUTANEOUS
  Administered 2015-11-26: 3 [IU] via SUBCUTANEOUS
  Administered 2015-11-26 (×2): 4 [IU] via SUBCUTANEOUS
  Administered 2015-11-26 (×2): 3 [IU] via SUBCUTANEOUS
  Administered 2015-11-26 – 2015-11-27 (×6): 4 [IU] via SUBCUTANEOUS
  Administered 2015-11-28: 3 [IU] via SUBCUTANEOUS
  Administered 2015-11-28 (×4): 4 [IU] via SUBCUTANEOUS
  Administered 2015-11-28 – 2015-11-30 (×6): 3 [IU] via SUBCUTANEOUS
  Administered 2015-11-30: 4 [IU] via SUBCUTANEOUS
  Administered 2015-11-30 – 2015-12-01 (×2): 3 [IU] via SUBCUTANEOUS

## 2015-11-25 MED ORDER — ETOMIDATE 2 MG/ML IV SOLN
20.0000 mg | Freq: Once | INTRAVENOUS | Status: AC
  Administered 2015-11-25: 20 mg via INTRAVENOUS

## 2015-11-25 MED ORDER — DEXTROSE 5 % IV SOLN
1.0000 g | Freq: Once | INTRAVENOUS | Status: AC
  Administered 2015-11-25: 1 g via INTRAVENOUS
  Filled 2015-11-25: qty 10

## 2015-11-25 MED ORDER — PANTOPRAZOLE SODIUM 40 MG PO PACK
40.0000 mg | PACK | ORAL | Status: DC
  Administered 2015-11-25 – 2015-11-30 (×6): 40 mg
  Filled 2015-11-25 (×8): qty 20

## 2015-11-25 MED ORDER — METHYLPREDNISOLONE SODIUM SUCC 125 MG IJ SOLR
125.0000 mg | Freq: Once | INTRAMUSCULAR | Status: AC
  Administered 2015-11-25: 125 mg via INTRAVENOUS
  Filled 2015-11-25: qty 2

## 2015-11-25 MED ORDER — FENTANYL CITRATE (PF) 100 MCG/2ML IJ SOLN
100.0000 ug | INTRAMUSCULAR | Status: DC | PRN
  Administered 2015-11-26 – 2015-11-30 (×14): 100 ug via INTRAVENOUS
  Filled 2015-11-25 (×14): qty 2

## 2015-11-25 MED ORDER — METHYLPREDNISOLONE SODIUM SUCC 40 MG IJ SOLR
40.0000 mg | Freq: Four times a day (QID) | INTRAMUSCULAR | Status: DC
  Administered 2015-11-25 – 2015-11-27 (×8): 40 mg via INTRAVENOUS
  Filled 2015-11-25 (×13): qty 1

## 2015-11-25 MED ORDER — SENNOSIDES 8.8 MG/5ML PO SYRP: 5.0000 mL | ORAL_SOLUTION | Freq: Two times a day (BID) | ORAL | Status: DC | PRN

## 2015-11-25 MED ORDER — ANTISEPTIC ORAL RINSE SOLUTION (CORINZ)
7.0000 mL | OROMUCOSAL | Status: DC
  Administered 2015-11-25: 7 mL via OROMUCOSAL

## 2015-11-25 MED ORDER — PROPOFOL 1000 MG/100ML IV EMUL
0.0000 ug/kg/min | INTRAVENOUS | Status: DC
  Administered 2015-11-25: 50 ug/kg/min via INTRAVENOUS
  Administered 2015-11-25: 25 ug/kg/min via INTRAVENOUS
  Administered 2015-11-25 – 2015-11-26 (×3): 50 ug/kg/min via INTRAVENOUS
  Administered 2015-11-26: 40 ug/kg/min via INTRAVENOUS
  Administered 2015-11-26 (×2): 50 ug/kg/min via INTRAVENOUS
  Administered 2015-11-26: 40 ug/kg/min via INTRAVENOUS
  Administered 2015-11-26: 45 ug/kg/min via INTRAVENOUS
  Administered 2015-11-27: 50 ug/kg/min via INTRAVENOUS
  Filled 2015-11-25 (×3): qty 100
  Filled 2015-11-25: qty 200
  Filled 2015-11-25 (×7): qty 100

## 2015-11-25 MED ORDER — MONTELUKAST SODIUM 4 MG PO CHEW: 4.0000 mg | CHEWABLE_TABLET | Freq: Every day | ORAL | Status: DC

## 2015-11-25 MED ORDER — FENTANYL CITRATE (PF) 100 MCG/2ML IJ SOLN: 100.0000 ug | INTRAMUSCULAR | Status: DC | PRN

## 2015-11-25 MED ORDER — MIDAZOLAM HCL 2 MG/2ML IJ SOLN
4.0000 mg | Freq: Once | INTRAMUSCULAR | Status: AC
  Administered 2015-11-25: 4 mg via INTRAVENOUS
  Filled 2015-11-25: qty 4

## 2015-11-25 MED ORDER — METOPROLOL TARTRATE 25 MG PO TABS: 25.0000 mg | ORAL_TABLET | Freq: Two times a day (BID) | ORAL | Status: DC

## 2015-11-25 MED ORDER — FENTANYL CITRATE (PF) 100 MCG/2ML IJ SOLN
50.0000 ug | Freq: Once | INTRAMUSCULAR | Status: AC
  Administered 2015-11-25: 50 ug via INTRAVENOUS
  Filled 2015-11-25: qty 2

## 2015-11-25 MED ORDER — DEXTROSE 5 % IV SOLN
500.0000 mg | Freq: Once | INTRAVENOUS | Status: AC
  Administered 2015-11-25: 500 mg via INTRAVENOUS
  Filled 2015-11-25: qty 500

## 2015-11-25 MED ORDER — SODIUM CHLORIDE 0.9 % IV SOLN
INTRAVENOUS | Status: DC
Start: 2015-11-25 — End: 2015-12-01
  Administered 2015-11-25 – 2015-11-29 (×2): via INTRAVENOUS
  Administered 2015-11-30: 10 mL/h via INTRAVENOUS

## 2015-11-25 MED ORDER — DEXTROSE 5 % IV SOLN
500.0000 mg | INTRAVENOUS | Status: DC
  Administered 2015-11-26 – 2015-11-28 (×3): 500 mg via INTRAVENOUS
  Filled 2015-11-25 (×6): qty 500

## 2015-11-25 MED ORDER — CHLORHEXIDINE GLUCONATE 0.12% ORAL RINSE (MEDLINE KIT)
15.0000 mL | Freq: Two times a day (BID) | OROMUCOSAL | Status: DC
Start: 2015-11-25 — End: 2015-12-01
  Administered 2015-11-25 – 2015-11-30 (×12): 15 mL via OROMUCOSAL

## 2015-11-25 MED ORDER — HEPARIN SODIUM (PORCINE) 5000 UNIT/ML IJ SOLN
5000.0000 [IU] | Freq: Three times a day (TID) | INTRAMUSCULAR | Status: DC
  Administered 2015-11-25 – 2015-12-01 (×19): 5000 [IU] via SUBCUTANEOUS
  Filled 2015-11-25 (×22): qty 1

## 2015-11-25 MED ORDER — CHLORHEXIDINE GLUCONATE 0.12% ORAL RINSE (MEDLINE KIT): 15.0000 mL | Freq: Two times a day (BID) | OROMUCOSAL | Status: DC

## 2015-11-25 NOTE — H&P (Signed)
PULMONARY / CRITICAL CARE MEDICINE   Name: George Leon MRN: [id number] DOB: Dec 25, 1955    ADMISSION DATE:  11/25/2015  REFERRING MD:  EDP  CHIEF COMPLAINT:  Respiratory failure   HISTORY OF PRESENT ILLNESS:   59yo male smoker with hx COPD, HTN,  CAD presented 12/17 with 3 day hx SOB, non-productive cough, fevers.  Son has been sick with the same.  Yesterday pt was sleepy and "in and out" per wife. Also c/o increased BLE edema and mild orthopnea. Denies chest pain, purulent sputum, hemoptysis, recent travel.  In ER had significant respiratory distress and hypoxia, placed on bipap.  PCCM called for ICU admission.   PAST MEDICAL HISTORY :  He  has a past medical history of Hypertension; Acute MI ([place]); Chronic pain; and COPD (chronic obstructive pulmonary disease) ([place]).  PAST SURGICAL HISTORY: He  has past surgical history that includes Joint replacement; Back surgery; and Cervical spine surgery (1988).  Allergies  Allergen Reactions  . Adhesive [Tape] Rash    Pulled skin off also  . Augmentin [Amoxicillin-Pot Clavulanate] Diarrhea  . Furosemide Nausea Only  . Hydrochlorothiazide Other (See Comments)    Back Pain & gives him kidney stones  . Sulfamethoxazole Swelling    Tongue Swelling    No current facility-administered medications on file prior to encounter.   Current Outpatient Prescriptions on File Prior to Encounter  Medication Sig  . albuterol (PROAIR HFA) 108 (90 BASE) MCG/ACT inhaler Inhale 1-2 puffs into the lungs every 4 (four) hours as needed. As needed for shortness of breath /wheeze/cough  . amLODipine (NORVASC) 10 MG tablet Take 0.5 tablets (5 mg total) by mouth daily.  Marland Kitchen aspirin EC 81 MG tablet Take 1 tablet (81 mg total) by mouth daily.  . budesonide-formoterol (SYMBICORT) 160-4.5 MCG/ACT inhaler Inhale 2 puffs into the lungs 2 (two) times daily.  . diazepam (VALIUM) 5 MG tablet Take 1 tablet (5 mg total) by mouth every 12 (twelve) hours as needed for  anxiety. (Patient taking differently: Take 2.5-5 mg by mouth every 12 (twelve) hours as needed for anxiety. He takes a 0.5 tablet before he eats per wife. It helps him relax so that he can swallow his food.)  . diltiazem (TAZTIA XT) 180 MG 24 hr capsule Take 1 capsule (180 mg total) by mouth daily.  Marland Kitchen esomeprazole (NEXIUM) 40 MG capsule Take 1 capsule (40 mg total) by mouth daily as needed. indigestion  . fish oil-omega-3 fatty acids 1000 MG capsule Take 1 g by mouth 2 (two) times daily.  Marland Kitchen HYDROcodone-acetaminophen (NORCO) 5-325 MG tablet Take 2 tablets by mouth every 4 (four) hours as needed for moderate pain.  Marland Kitchen losartan (COZAAR) 50 MG tablet Take 1 tablet (50 mg total) by mouth daily.  . metoprolol (TOPROL XL) 200 MG 24 hr tablet Take 1 tablet (200 mg total) by mouth daily.  . montelukast (SINGULAIR) 4 MG chewable tablet Chew 1 tablet (4 mg total) by mouth at bedtime.  . Multiple Vitamins-Minerals (OCUVITE ADULT FORMULA) CAPS Take 1 capsule by mouth daily.  . rosuvastatin (CRESTOR) 10 MG tablet Take 1 tablet (10 mg total) by mouth daily. Substitute for Lipitor  . atorvastatin (LIPITOR) 80 MG tablet Take 1 tablet (80 mg total) by mouth daily. (Patient not taking: Reported on 11/25/2015)  . Elastic Bandages & Supports (MEDICAL COMPRESSION STOCKINGS) MISC 1 each by Does not apply route once.    FAMILY HISTORY:  His has no family status information on file.   SOCIAL HISTORY:  He  reports that he has been smoking Cigarettes.  He has been smoking about 1.00 pack per day. He has never used smokeless tobacco. He reports that he does not drink alcohol or use illicit drugs.  REVIEW OF SYSTEMS:   As per HPI obtained from wife. All other systems reviewed and were neg.    SUBJECTIVE:    VITAL SIGNS: BP 119/76 mmHg  Pulse 115  Temp(Src) 100.2 F (37.9 C) (Oral)  Resp 22  Ht 5' 7.25" (1.708 m)  Wt 250 lb (113.399 kg)  BMI 38.87 kg/m2  SpO2 98%  HEMODYNAMICS:    VENTILATOR SETTINGS: Vent  Mode:  [-] BIPAP FiO2 (%):  [60 %-100 %] 60 %  INTAKE / OUTPUT:    PHYSICAL EXAMINATION: General:  Obese, chronically ill appearing male, NAD on bipap  Neuro:  Somnolent but arouses to name, answers some questions appropriately, confused at times, pulling off sat moitor  HEENT:  Mm moist, bipap  Cardiovascular:  s1s2 rrr Lungs:  resps even, tachypneic on bipap, exp wheeze throughout Abdomen:  Round, soft, non tender  Musculoskeletal:  Warm and dry, 1+ BLE edema   LABS:  BMET  Recent Labs Lab 11/25/15 1111  NA 132*  K 3.4*  CL 92*  CO2 30  BUN 5*  CREATININE 0.73  GLUCOSE 125*    Electrolytes  Recent Labs Lab 11/25/15 1111  CALCIUM 8.7*    CBC  Recent Labs Lab 11/25/15 1111  WBC 23.6*  HGB 13.0  HCT 41.4  PLT 249    Coag's No results for input(s): APTT, INR in the last 168 hours.  Sepsis Markers  Recent Labs Lab 11/25/15 1123  LATICACIDVEN 1.41    ABG  Recent Labs Lab 11/25/15 1106  PHART 7.196*  PCO2ART 81.1*  PO2ART 69.0*    Liver Enzymes No results for input(s): AST, ALT, ALKPHOS, BILITOT, ALBUMIN in the last 168 hours.  Cardiac Enzymes  Recent Labs Lab 11/25/15 1111  TROPONINI 0.21*    Glucose No results for input(s): GLUCAP in the last 168 hours.  Imaging Dg Chest Port 1 View  11/25/2015  CLINICAL DATA:  59 year old male with acute shortness of breath for 1 day. EXAM: PORTABLE CHEST 1 VIEW COMPARISON:  04/12/2014 and prior radiographs FINDINGS: The cardiomediastinal silhouette is unremarkable. New interstitial opacities bilaterally are noted. There is no evidence of consolidation, pulmonary edema, suspicious pulmonary nodule/mass, pleural effusion, or pneumothorax. No acute bony abnormalities are identified. IMPRESSION: New interstitial opacities bilaterally which are nonspecific but may represent edema or infection. Electronically Signed   By: Margarette Canada M.D.   On: 11/25/2015 11:16     STUDIES:     CULTURES:   ANTIBIOTICS: Rocephin 12/17>>>  Azithro 12/17>>>  SIGNIFICANT EVENTS:   LINES/TUBES:   DISCUSSION: 59yo male smoker with hx COPD, admitted with multifactorial acute respiratory failure r/t likely AECOPD, viral URI now with PNA +/- decompensated diastolic heart failure likely complicated by undiagnosed OSA and chronic narcs.    ASSESSMENT / PLAN:  PULMONARY Acute hypoxemic and hypercarbic respiratory failure  CAP  AECOPD  Tobacco abuse  Suspect OSA  P:   Continue bipap  F/u ABG  abx as above  Resp status tenuous  BD's - duonebs, PRN albuterol  IV steroids  Flu PCR   CARDIOVASCULAR ?Acute on chronic diastolic heart failure -- increased BLE edema and mild orthopnea over last month per wife. BNP mildly elevated SIRS Tachycardia  Hx HTN P:  Consider gentle diuresis - hold for now with  borderline BP/ concern for sepsis  Trend troponin  Echo  F/u BNP  Hold home losartan, diltiazem for now  Continue half dose home metoprolol, hold for hypotension   RENAL Hyponatremia - mild  P:   F/u chem, mg, phos   GASTROINTESTINAL Hx GERD  P:   NPO while on bipap PPI   HEMATOLOGIC Leukocytosis  P:  F/u cbc  SQ heparin   INFECTIOUS CAP  Viral URI  P:   Check Flu PCR  Rocephin, azithro as above  Trend pct  Consider tamiflu - hold for now   ENDOCRINE Hyperglycemia - no known hx DM  P:   SSI  HgbA1c   NEUROLOGIC AMS - r/t hypercarbia  Hx anxiety, chronic pain  P:   Hold all sedating medications for now  F/u ABG  bipap as above    FAMILY  - Updates:  Wife and son updated at bedside 12/17.    Nickolas Madrid, NP 11/25/2015  2:01 PM Pager: [phone] or [phone]

## 2015-11-25 NOTE — ED Notes (Signed)
Intubated by Dr. Halford Chessman at [phone] with 8.0 ETT. (24 at lip). OGT inserted also.

## 2015-11-25 NOTE — ED Notes (Signed)
Intubating at this time Halford Chessman, MD)

## 2015-11-25 NOTE — ED Notes (Signed)
Placed on BiPap by RT, Kal.

## 2015-11-25 NOTE — ED Notes (Signed)
Cough, cold, congestion for 3 days; SOB today. Son has been sick with the same symptoms. Sats on room air 32; on NRB 91. Diaphoretic. Alert and oriented.

## 2015-11-25 NOTE — Procedures (Signed)
Intubation Procedure Note George Leon [id number] [birth year]  Procedure: Intubation Indications: Respiratory insufficiency  Procedure Details Consent: Risks of procedure as well as the alternatives and risks of each were explained to the (patient/caregiver).  Consent for procedure obtained. Time Out: Verified patient identification, verified procedure, site/side was marked, verified correct patient position, special equipment/implants available, medications/allergies/relevent history reviewed, required imaging and test results available.  Performed  Maximum sterile technique was used including gloves, hand hygiene and mask.  MAC and 4  Given 4 mg versed, 100 mcg fentanyl, 20 mg etomidate.  Inserted #8 ETT to 24 cm at lip.  Used glidescope.  Confirmed with CO2 detector and auscultation.  Evaluation Hemodynamic Status: BP stable throughout; O2 sats: stable throughout Patient's Current Condition: stable Complications: No apparent complications Patient did tolerate procedure well. Chest X-ray ordered to verify placement.  CXR: pending.  George Mires, MD [facility] Pulmonary/Critical Care 11/25/2015, 3:17 PM Pager:  [phone] After 3pm call: [phone]

## 2015-11-25 NOTE — ED Provider Notes (Signed)
CSN: [id number]     Arrival date & time 11/25/15  1025 History   First MD Initiated Contact with Patient 11/25/15 1031     Chief Complaint  Patient presents with  . Respiratory Distress     HPI  Vision presents for evaluation via private vehicle. Accompanied by his son and wife. History of COPD. Has home inhalers, does not have home oxygen or nebulizers. No recent treatment. Son has had URI symptoms for a week. Patient has had symptoms for 3-4 days with runny nose and cough. Marked worsening dyspnea this morning.  Chest pain. No fever. Dry nonproductive cough. History of heart attack no chest pain or peripheral edema.  Past Medical History  Diagnosis Date  . Hypertension   . Acute MI ([place])   . Chronic pain   . COPD (chronic obstructive pulmonary disease) [facility])    Past Surgical History  Procedure Laterality Date  . Joint replacement    . Back surgery    . Cervical spine surgery  1988   History reviewed. No pertinent family history. Social History  Substance Use Topics  . Smoking status: Current Some Day Smoker -- 1.00 packs/day    Types: Cigarettes    Last Attempt to Quit: 03/22/2014  . Smokeless tobacco: Never Used  . 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  HENT: Negative for mouth sores, sore throat and trouble swallowing.   Eyes: Negative for visual disturbance.  Respiratory: Positive for chest tightness, shortness of breath and wheezing. Negative for cough.   Cardiovascular: Negative for chest pain.  Gastrointestinal: Negative for nausea, vomiting, abdominal pain, diarrhea and abdominal distention.  Endocrine: Negative for polydipsia, polyphagia and polyuria.  Genitourinary: Negative for dysuria, frequency and hematuria.  Musculoskeletal: Negative for gait problem.  Skin: Negative for color change, pallor and rash.   Neurological: Negative for dizziness, syncope, light-headedness and headaches.  Hematological: Does not bruise/bleed easily.  Psychiatric/Behavioral: Negative for behavioral problems and confusion.      Allergies  Adhesive; Augmentin; Furosemide; Hydrochlorothiazide; and Sulfamethoxazole  Home Medications   Prior to Admission medications   Medication Sig Start Date End Date Taking? Authorizing Provider  albuterol (PROAIR HFA) 108 (90 BASE) MCG/ACT inhaler Inhale 1-2 puffs into the lungs every 4 (four) hours as needed. As needed for shortness of breath /wheeze/cough 06/23/15  Yes Patrecia Pour, MD  amLODipine (NORVASC) 10 MG tablet Take 0.5 tablets (5 mg total) by mouth daily. 09/21/15  Yes Patrecia Pour, MD  aspirin EC 81 MG tablet Take 1 tablet (81 mg total) by mouth daily. 08/23/13  Yes Gerda Diss, MD  budesonide-formoterol [facility]) 160-4.5 MCG/ACT inhaler Inhale 2 puffs into the lungs 2 (two) times daily. 04/22/14  Yes Gerda Diss, MD  dextromethorphan (DELSYM) 30 MG/5ML liquid Take 30 mg by mouth as needed for cough.    Yes Historical Provider, MD  diazepam (VALIUM) 5 MG tablet Take 1 tablet (5 mg total) by mouth every 12 (twelve) hours as needed for anxiety. Patient taking differently: Take 2.5-5 mg by mouth every 12 (twelve) hours as needed for anxiety. He takes a 0.5 tablet before he eats per wife. It helps him relax so that he can swallow his food. 09/21/15  Yes Patrecia Pour, MD  diltiazem (TAZTIA XT) 180 MG 24 hr capsule Take 1 capsule (180 mg total)  by mouth daily. 04/21/15  Yes Patrecia Pour, MD  esomeprazole (NEXIUM) 40 MG capsule Take 1 capsule (40 mg total) by mouth daily as needed. indigestion 06/23/15  Yes Patrecia Pour, MD  fish oil-omega-3 fatty acids 1000 MG capsule Take 1 g by mouth 2 (two) times daily.   Yes Historical Provider, MD  HYDROcodone-acetaminophen (NORCO) 5-325 MG tablet Take 2 tablets by mouth every 4 (four) hours as needed for moderate pain. 09/21/15   Yes Patrecia Pour, MD  losartan (COZAAR) 50 MG tablet Take 1 tablet (50 mg total) by mouth daily. 07/26/15  Yes Patrecia Pour, MD  metoprolol (TOPROL XL) 200 MG 24 hr tablet Take 1 tablet (200 mg total) by mouth daily. 04/21/15  Yes Patrecia Pour, MD  montelukast (SINGULAIR) 4 MG chewable tablet Chew 1 tablet (4 mg total) by mouth at bedtime. 04/12/14  Yes Tanna Furry, MD  Multiple Vitamins-Minerals (OCUVITE ADULT FORMULA) CAPS Take 1 capsule by mouth daily. 05/22/15  Yes Patrecia Pour, MD  rosuvastatin (CRESTOR) 10 MG tablet Take 1 tablet (10 mg total) by mouth daily. Substitute for Lipitor 04/21/15  Yes Patrecia Pour, MD  atorvastatin (LIPITOR) 80 MG tablet Take 1 tablet (80 mg total) by mouth daily. Patient not taking: Reported on 11/25/2015 07/26/15   Patrecia Pour, MD  Elastic Bandages & Supports ([place]) [place] 1 each by Does not apply route once. 06/19/15   Patrecia Pour, MD  HYDROcodone-acetaminophen (NORCO) 5-325 MG tablet Take 2 tablets by mouth every 4 (four) hours as needed for moderate pain. Patient not taking: Reported on 11/25/2015 09/21/15   Patrecia Pour, MD  HYDROcodone-acetaminophen (NORCO/VICODIN) 5-325 MG tablet Take 2 tablets by mouth every 4 (four) hours as needed for moderate pain. Patient not taking: Reported on 11/25/2015 09/21/15   Patrecia Pour, MD   BP 129/82 mmHg  Pulse 117  Temp(Src) 100.2 F (37.9 C) (Oral)  Resp 28  Ht 5' 7.25" (1.708 m)  Wt 250 lb (113.399 kg)  BMI 38.87 kg/m2  SpO2 97% Physical Exam  Constitutional: He appears distressed.  HENT:  Head: Normocephalic.  Eyes: Conjunctivae are normal. Pupils are equal, round, and reactive to light. No scleral icterus.  Neck: Normal range of motion. Neck supple. No thyromegaly present.  Cardiovascular: Regular rhythm.  Tachycardia present.  Exam reveals no gallop and no friction rub.   No murmur heard. Sinus tachycardia rate 140 on the monitor.  Pulmonary/Chest: Accessory muscle usage present.  Tachypnea noted. He is in respiratory distress. He has decreased breath sounds in the right upper field, the right middle field, the right lower field, the left upper field, the left middle field and the left lower field. He has wheezes in the right upper field, the right middle field, the right lower field, the left upper field, the left middle field and the left lower field. He has no rales.  Abdominal: Soft. Bowel sounds are normal. He exhibits no distension. There is no tenderness. There is no rebound.  Musculoskeletal: Normal range of motion.  Neurological: He is alert.  Speaking, able to comprehend and participate. 1-2 word dyspnea. Can tell me his name., and that he is at [facility]".  Skin: Skin is warm and dry. No rash noted.  Psychiatric: He has a normal mood and affect. His behavior is normal.    ED Course  Procedures (including critical care time) Labs Review Labs Reviewed  CBC WITH DIFFERENTIAL/PLATELET - Abnormal; Notable for the following:  WBC 23.6 (*)    Neutro Abs 18.8 (*)    Monocytes Absolute 2.8 (*)    All other components within normal limits  BASIC METABOLIC PANEL - Abnormal; Notable for the following:    Sodium 132 (*)    Potassium 3.4 (*)    Chloride 92 (*)    Glucose, Bld 125 (*)    BUN 5 (*)    Calcium 8.7 (*)    All other components within normal limits  TROPONIN I - Abnormal; Notable for the following:    Troponin I 0.21 (*)    All other components within normal limits  BRAIN NATRIURETIC PEPTIDE - Abnormal; Notable for the following:    B Natriuretic Peptide 134.7 (*)    All other components within normal limits  I-STAT ARTERIAL BLOOD GAS, ED - Abnormal; Notable for the following:    pH, Arterial 7.196 (*)    pCO2 arterial 81.1 (*)    pO2, Arterial 69.0 (*)    Bicarbonate 31.4 (*)    All other components within normal limits  CULTURE, BLOOD (ROUTINE X 2)  CULTURE, BLOOD (ROUTINE X 2)  BLOOD GAS, ARTERIAL  I-STAT CG4 LACTIC ACID, ED    Imaging  Review Dg Chest Port 1 View  11/25/2015  CLINICAL DATA:  59 year old male with acute shortness of breath for 1 day. EXAM: PORTABLE CHEST 1 VIEW COMPARISON:  04/12/2014 and prior radiographs FINDINGS: The cardiomediastinal silhouette is unremarkable. New interstitial opacities bilaterally are noted. There is no evidence of consolidation, pulmonary edema, suspicious pulmonary nodule/mass, pleural effusion, or pneumothorax. No acute bony abnormalities are identified. IMPRESSION: New interstitial opacities bilaterally which are nonspecific but may represent edema or infection. Electronically Signed   By: Margarette Canada M.D.   On: 11/25/2015 11:16   I have personally reviewed and evaluated these images and lab results as part of my medical decision-making.   EKG Interpretation None      MDM   Final diagnoses:  Acute respiratory failure with hypercapnia (HCC)  Hypoxemia  CAP (community acquired pneumonia)   I was asked to leave the care of another patient to evaluate and treat this patient, secondary to his acuity of presentation   Patient initially c saturation of 26% with a good waveform. Placed on mask O2. He slowly climbs to 90%. Respiratory therapy consulted and initiated on continuous nebulized albuterol. Additional studies pending.  CXR c Bilateral infiltrate v. Edema. Noted temp 100.2.  BLC and Lactate ordered.  ABG 7.19/81/69/31.  BiPaP initiated for hypercapnia.  Pt confused, but able to cooperate initially.  CRITICAL CARE Performed by: Tanna Furry JOSEPH   Total critical care time: 60 minutes. Left the care of another critical patient to attend to this patient.  Continuous nebulized albuterol for 1 hour.  BiPap initiation, and first 30 minutes of management.  Critical care time was exclusive of separately billable procedures and treating other patients.  Critical care was necessary to treat or prevent imminent or life-threatening deterioration.  Critical care was time spent  personally by me on the following activities: development of treatment plan with patient and/or surrogate as well as nursing, discussions with consultants, evaluation of patient's response to treatment, examination of patient, obtaining history from patient or surrogate, ordering and performing treatments and interventions, ordering and review of laboratory studies, ordering and review of radiographic studies, pulse oximetry and re-evaluation of patient's condition. cared  Tanna Furry, MD 11/25/15 [id number]

## 2015-11-25 NOTE — Progress Notes (Signed)
Placed patient on BiPAP per MD's order. 

## 2015-11-26 ENCOUNTER — Inpatient Hospital Stay (HOSPITAL_COMMUNITY): Payer: Medicaid Other

## 2015-11-26 DIAGNOSIS — R0600 Dyspnea, unspecified: Secondary | ICD-10-CM

## 2015-11-26 DIAGNOSIS — J441 Chronic obstructive pulmonary disease with (acute) exacerbation: Secondary | ICD-10-CM

## 2015-11-26 LAB — CBC
HEMATOCRIT: 37.6 % — AB (ref 39.0–52.0)
HEMOGLOBIN: 11.9 g/dL — AB (ref 13.0–17.0)
MCH: 29.5 pg (ref 26.0–34.0)
MCHC: 31.6 g/dL (ref 30.0–36.0)
MCV: 93.1 fL (ref 78.0–100.0)
Platelets: 226 10*3/uL (ref 150–400)
RBC: 4.04 MIL/uL — AB (ref 4.22–5.81)
RDW: 14 % (ref 11.5–15.5)
WBC: 15.1 10*3/uL — AB (ref 4.0–10.5)

## 2015-11-26 LAB — BASIC METABOLIC PANEL
ANION GAP: 10 (ref 5–15)
ANION GAP: 9 (ref 5–15)
BUN: 9 mg/dL (ref 6–20)
BUN: 16 mg/dL (ref 6–20)
CO2: 28 mmol/L (ref 22–32)
CO2: 28 mmol/L (ref 22–32)
Calcium: 8.4 mg/dL — ABNORMAL LOW (ref 8.9–10.3)
Calcium: 9.1 mg/dL (ref 8.9–10.3)
Chloride: 95 mmol/L — ABNORMAL LOW (ref 101–111)
Chloride: 99 mmol/L — ABNORMAL LOW (ref 101–111)
Creatinine, Ser: 0.91 mg/dL (ref 0.61–1.24)
Creatinine, Ser: 0.76 mg/dL (ref 0.61–1.24)
GFR calc Af Amer: >60 mL/min (ref >60)
GFR calc Af Amer: >60 mL/min (ref >60)
GFR calc non Af Amer: >60 mL/min (ref >60)
GLUCOSE: 147 mg/dL — AB (ref 65–99)
GLUCOSE: 166 mg/dL — AB (ref 65–99)
POTASSIUM: 2.5 mmol/L — AB (ref 3.5–5.1)
POTASSIUM: 3.7 mmol/L (ref 3.5–5.1)
Sodium: 133 mmol/L — ABNORMAL LOW (ref 135–145)
Sodium: 136 mmol/L (ref 135–145)

## 2015-11-26 LAB — PHOSPHORUS: Phosphorus: 3 mg/dL (ref 2.5–4.6)

## 2015-11-26 LAB — GLUCOSE, CAPILLARY
GLUCOSE-CAPILLARY: 153 mg/dL — AB (ref 65–99)
GLUCOSE-CAPILLARY: 175 mg/dL — AB (ref 65–99)
Glucose-Capillary: 131 mg/dL — ABNORMAL HIGH (ref 65–99)
Glucose-Capillary: 147 mg/dL — ABNORMAL HIGH (ref 65–99)
Glucose-Capillary: 134 mg/dL — ABNORMAL HIGH (ref 65–99)
Glucose-Capillary: 131 mg/dL — ABNORMAL HIGH (ref 65–99)

## 2015-11-26 LAB — BRAIN NATRIURETIC PEPTIDE: B NATRIURETIC PEPTIDE 5: 132.8 pg/mL — AB (ref 0.0–100.0)

## 2015-11-26 LAB — TROPONIN I: Troponin I: 0.11 ng/mL — ABNORMAL HIGH (ref <0.031)

## 2015-11-26 LAB — PROCALCITONIN: Procalcitonin: 1.17 ng/mL

## 2015-11-26 LAB — MAGNESIUM: Magnesium: 2.2 mg/dL (ref 1.7–2.4)

## 2015-11-26 MED ORDER — POTASSIUM CHLORIDE 10 MEQ/100ML IV SOLN
10.0000 meq | INTRAVENOUS | Status: AC
  Administered 2015-11-26 (×5): 10 meq via INTRAVENOUS
  Filled 2015-11-26 (×4): qty 100

## 2015-11-26 MED ORDER — INFLUENZA VAC SPLIT QUAD 0.5 ML IM SUSY: 0.5000 mL | PREFILLED_SYRINGE | INTRAMUSCULAR | Status: DC | PRN

## 2015-11-26 MED ORDER — PNEUMOCOCCAL VAC POLYVALENT 25 MCG/0.5ML IJ INJ: 0.5000 mL | INJECTION | INTRAMUSCULAR | Status: DC | PRN

## 2015-11-26 MED ORDER — FUROSEMIDE 10 MG/ML IJ SOLN: 40.0000 mg | Freq: Every day | INTRAMUSCULAR | Status: DC

## 2015-11-26 MED ORDER — PRO-STAT SUGAR FREE PO LIQD
30.0000 mL | Freq: Two times a day (BID) | ORAL | Status: DC
  Administered 2015-11-26 – 2015-11-27 (×3): 30 mL
  Filled 2015-11-26 (×4): qty 30

## 2015-11-26 MED ORDER — POTASSIUM CHLORIDE 10 MEQ/100ML IV SOLN
10.0000 meq | INTRAVENOUS | Status: AC
  Administered 2015-11-26 (×5): 10 meq via INTRAVENOUS
  Filled 2015-11-26 (×5): qty 100

## 2015-11-26 MED ORDER — VITAL HIGH PROTEIN PO LIQD
1000.0000 mL | ORAL | Status: DC
  Administered 2015-11-26: 1000 mL
  Filled 2015-11-26 (×3): qty 1000

## 2015-11-26 NOTE — Progress Notes (Signed)
Brief Nutrition Note  Consult received for enteral/tube feeding initiation and management.  Adult Enteral Nutrition 24M PEPUP Protocol initiated. Full assessment to follow.  Admitting Dx: Hypoxemia [R09.02] CAP (community acquired pneumonia) [J18.9] Acute respiratory failure with hypercapnia ([place]) [J96.02] Endotracheal tube present [Z78.9]  Body mass index is 37.67 kg/(m^2). Pt meets criteria for obesity based on current BMI.  Labs:   Recent Labs Lab 11/25/15 1111 11/26/15 0130  NA 132* 133*  K 3.4* 2.5*  CL 92* 95*  CO2 30 28  BUN 5* 9  CREATININE 0.73 0.91  CALCIUM 8.7* 8.4*  GLUCOSE 125* 147*    Clayton Bibles, MS, RD, LDN Pager: [phone] After Hours Pager: [phone]

## 2015-11-26 NOTE — Progress Notes (Addendum)
PULMONARY / CRITICAL CARE MEDICINE   Name: George Leon MRN: [id number] DOB: 05/03/1956    ADMISSION DATE:  11/25/2015  REFERRING MD:  EDP  CHIEF COMPLAINT:  Respiratory failure   HISTORY OF PRESENT ILLNESS:   59yo male smoker with hx COPD, HTN,  CAD presented 12/17 with 3 day hx SOB, non-productive cough, fevers.  Son has been sick with the same.  Yesterday pt was sleepy and "in and out" per wife. Also c/o increased BLE edema and mild orthopnea. Denies chest pain, purulent sputum, hemoptysis, recent travel.  In ER had significant respiratory distress and hypoxia, placed on bipap.  PCCM called for ICU admission.   PAST MEDICAL HISTORY :  He  has a past medical history of Hypertension; Acute MI ([place]); Chronic pain; and COPD (chronic obstructive pulmonary disease) ([place]).  PAST SURGICAL HISTORY: He  has past surgical history that includes Joint replacement; Back surgery; and Cervical spine surgery (1988).  Allergies  Allergen Reactions  . Adhesive [Tape] Rash    Pulled skin off also  . Augmentin [Amoxicillin-Pot Clavulanate] Diarrhea  . Furosemide Nausea Only  . Hydrochlorothiazide Other (See Comments)    Back Pain & gives him kidney stones  . Sulfamethoxazole Swelling    Tongue Swelling    No current facility-administered medications on file prior to encounter.   Current Outpatient Prescriptions on File Prior to Encounter  Medication Sig  . albuterol (PROAIR HFA) 108 (90 BASE) MCG/ACT inhaler Inhale 1-2 puffs into the lungs every 4 (four) hours as needed. As needed for shortness of breath /wheeze/cough  . amLODipine (NORVASC) 10 MG tablet Take 0.5 tablets (5 mg total) by mouth daily.  Marland Kitchen aspirin EC 81 MG tablet Take 1 tablet (81 mg total) by mouth daily.  . budesonide-formoterol (SYMBICORT) 160-4.5 MCG/ACT inhaler Inhale 2 puffs into the lungs 2 (two) times daily.  . diazepam (VALIUM) 5 MG tablet Take 1 tablet (5 mg total) by mouth every 12 (twelve) hours as needed for  anxiety. (Patient taking differently: Take 2.5-5 mg by mouth every 12 (twelve) hours as needed for anxiety. He takes a 0.5 tablet before he eats per wife. It helps him relax so that he can swallow his food.)  . diltiazem (TAZTIA XT) 180 MG 24 hr capsule Take 1 capsule (180 mg total) by mouth daily.  Marland Kitchen esomeprazole (NEXIUM) 40 MG capsule Take 1 capsule (40 mg total) by mouth daily as needed. indigestion  . fish oil-omega-3 fatty acids 1000 MG capsule Take 1 g by mouth 2 (two) times daily.  Marland Kitchen HYDROcodone-acetaminophen (NORCO) 5-325 MG tablet Take 2 tablets by mouth every 4 (four) hours as needed for moderate pain.  Marland Kitchen losartan (COZAAR) 50 MG tablet Take 1 tablet (50 mg total) by mouth daily.  . metoprolol (TOPROL XL) 200 MG 24 hr tablet Take 1 tablet (200 mg total) by mouth daily.  . montelukast (SINGULAIR) 4 MG chewable tablet Chew 1 tablet (4 mg total) by mouth at bedtime.  . Multiple Vitamins-Minerals (OCUVITE ADULT FORMULA) CAPS Take 1 capsule by mouth daily.  . rosuvastatin (CRESTOR) 10 MG tablet Take 1 tablet (10 mg total) by mouth daily. Substitute for Lipitor  . atorvastatin (LIPITOR) 80 MG tablet Take 1 tablet (80 mg total) by mouth daily. (Patient not taking: Reported on 11/25/2015)  . Elastic Bandages & Supports ([employer] STOCKINGS) [place] 1 each by Does not apply route once.    FAMILY HISTORY:  His has no family status information on file.   SOCIAL HISTORY:  He  reports that he has been smoking Cigarettes.  He has been smoking about 1.00 pack per day. He has never used smokeless tobacco. He reports that he does not drink alcohol or use illicit drugs.  REVIEW OF SYSTEMS:   As per HPI obtained from wife. All other systems reviewed and were neg.    SUBJECTIVE:    VITAL SIGNS: BP 137/83 mmHg  Pulse 93  Temp(Src) 98.5 F (36.9 C) (Oral)  Resp 20  Ht 5' 7.25" (1.708 m)  Wt 109.9 kg (242 lb 4.6 oz)  BMI 37.67 kg/m2  SpO2 95%  HEMODYNAMICS:    VENTILATOR  SETTINGS: Vent Mode:  [-] PRVC FiO2 (%):  [50 %-100 %] 50 % Set Rate:  [18 bmp-26 bmp] 26 bmp Vt Set:  [530 mL] 530 mL PEEP:  [5 cmH20-10 cmH20] 5 cmH20 Plateau Pressure:  [23 cmH20-25 cmH20] 23 cmH20  INTAKE / OUTPUT: I/O last 3 completed shifts: In: 1276.3 [I.V.:636.1; NG/GT:340.2; IV Piggyback:300] Out: [id number] [Urine:1245; Emesis/NG output:20]  PHYSICAL EXAMINATION: General:  Obese, chronically ill appearing male, on MV Neuro:  Sedated, opens eyes to voice, moved UE's HEENT:  Mm moist, ETT in place Cardiovascular:  s1s2 rrr Lungs:  resps even, wheeze has resolved.  Abdomen:  Round, soft, non tender  Musculoskeletal:  Warm and dry, 1+ BLE edema   LABS:  BMET  Recent Labs Lab 11/25/15 1111 11/26/15 0130  NA 132* 133*  K 3.4* 2.5*  CL 92* 95*  CO2 30 28  BUN 5* 9  CREATININE 0.73 0.91  GLUCOSE 125* 147*    Electrolytes  Recent Labs Lab 11/25/15 1111 11/26/15 0130  CALCIUM 8.7* 8.4*    CBC  Recent Labs Lab 11/25/15 1111 11/26/15 0130  WBC 23.6* 15.1*  HGB 13.0 11.9*  HCT 41.4 37.6*  PLT 249 226    Coag's No results for input(s): APTT, INR in the last 168 hours.  Sepsis Markers  Recent Labs Lab 11/25/15 1123 11/25/15 1620 11/26/15 0130  LATICACIDVEN 1.41 1.3  --   PROCALCITON  --  1.13 1.17    ABG  Recent Labs Lab 11/25/15 1106 11/25/15 1604 11/25/15 1748  PHART 7.196* 7.147* 7.359  PCO2ART 81.1* 91.9* 52.1*  PO2ART 69.0* 349.0* 129.0*    Liver Enzymes No results for input(s): AST, ALT, ALKPHOS, BILITOT, ALBUMIN in the last 168 hours.  Cardiac Enzymes  Recent Labs Lab 11/25/15 1620 11/25/15 1938 11/26/15 0130  TROPONINI 0.08* 0.07* 0.11*    Glucose  Recent Labs Lab 11/25/15 1606 11/25/15 1937 11/25/15 2345 11/26/15 0337 11/26/15 0900  GLUCAP 167* 161* 153* 131* 147*    Imaging Dg Chest Port 1 View  11/26/2015  CLINICAL DATA:  59 year old male with respiratory failure. EXAM: PORTABLE CHEST 1 VIEW COMPARISON:   11/25/2015 and prior exams FINDINGS: The cardiomediastinal silhouette is unchanged. An endotracheal tube with tip 3.5 cm above the carina and NG tube entering the stomach with tip off the field of view again noted. Interstitial prominence is unchanged. There is no evidence of pleural effusion or pneumothorax. IMPRESSION: Support apparatus as described  -no evidence of acute abnormality. Electronically Signed   By: Margarette Canada M.D.   On: 11/26/2015 08:48   Dg Chest Portable 1 View  11/25/2015  CLINICAL DATA:  Endotracheal tube placement EXAM: PORTABLE CHEST - 1 VIEW COMPARISON:  11/25/2015 FINDINGS: Cardiac shadow is mildly enlarged but stable. An endotracheal tube and nasogastric catheter are seen. The lungs are well aerated bilaterally with diffuse interstitial changes similar to  that seen on the recent exam. No acute bony abnormality is noted. Postsurgical changes in the cervical spine are seen. IMPRESSION: Stable interstitial infiltrates. Tubes and lines as described in satisfactory position. Electronically Signed   By: Inez Catalina M.D.   On: 11/25/2015 15:42   Dg Chest Port 1 View  11/25/2015  CLINICAL DATA:  59 year old male with acute shortness of breath for 1 day. EXAM: PORTABLE CHEST 1 VIEW COMPARISON:  04/12/2014 and prior radiographs FINDINGS: The cardiomediastinal silhouette is unremarkable. New interstitial opacities bilaterally are noted. There is no evidence of consolidation, pulmonary edema, suspicious pulmonary nodule/mass, pleural effusion, or pneumothorax. No acute bony abnormalities are identified. IMPRESSION: New interstitial opacities bilaterally which are nonspecific but may represent edema or infection. Electronically Signed   By: Margarette Canada M.D.   On: 11/25/2015 11:16   Dg Abd Portable 1v  11/25/2015  CLINICAL DATA:  Orogastric tube placement. EXAM: PORTABLE ABDOMEN - 1 VIEW COMPARISON:  None. FINDINGS: Enteric tube appears adequately positioned in the stomach with tip directed  towards the stomach antrum/ pylorus. Visualized bowel gas pattern is nonobstructive. No evidence of free intraperitoneal air seen. IMPRESSION: Orogastric tube appears adequately positioned in the stomach with tip directed towards the stomach antrum/ pylorus region. Electronically Signed   By: Franki Cabot M.D.   On: 11/25/2015 21:11   Dg Abd Portable 1v  11/25/2015  CLINICAL DATA:  NG tube placement. EXAM: PORTABLE ABDOMEN - 1 VIEW COMPARISON:  None. FINDINGS: An NG tube is identified with tip at the esophagogastric junction. IMPRESSION: NG tube with tip at the esophagogastric junction -recommend advancement. Electronically Signed   By: Margarette Canada M.D.   On: 11/25/2015 15:43     STUDIES:    CULTURES: Flu PCR 12/17 >> negative Blood 12/17 >>  resp 12/17 >>   ANTIBIOTICS: Rocephin 12/17>>>  Azithro 12/17>>>  SIGNIFICANT EVENTS:   LINES/TUBES: ETT 12/17 >>   DISCUSSION: 59yo male smoker with hx COPD, admitted with multifactorial acute respiratory failure r/t likely AECOPD, viral URI now with PNA +/- decompensated diastolic heart failure likely complicated by undiagnosed OSA and chronic narcs.   ASSESSMENT / PLAN:  PULMONARY Acute hypoxemic and hypercarbic respiratory failure  Suspected CAP  AECOPD  Tobacco abuse  Suspected OSA  P:   MV as ordered, wean FiO2 and PEEP 12/18 F/u ABG  abx as above  BD's - duonebs, PRN albuterol  IV steroids  Diuresis if able to tolerate   CARDIOVASCULAR ?Acute on chronic diastolic heart failure -- increased BLE edema and mild orthopnea over last month per wife. BNP mildly elevated SIRS Tachycardia  Hx HTN P:  Ordered lasix on 12/18, but d/c'd as wife concerned about allergy (described as nausea in chart, described as "kidney stones" by wife). He likely will need diuresis as we move forward but deferred for now Trend troponin  Echo pending F/u BNP  Hold home losartan, diltiazem for now  Continue half dose home metoprolol, hold for  hypotension   RENAL Hyponatremia - mild  P:   F/u chem, mg, phos   GASTROINTESTINAL Hx GERD  P:   NPO, nutrition consult for TF PPI   HEMATOLOGIC Leukocytosis  P:  F/u cbc  SQ heparin   INFECTIOUS Suspected CAP  Viral URI, flu negative P:   Rocephin, azithro as above  Trend pct    ENDOCRINE Hyperglycemia - no known hx DM  P:   SSI  HgbA1c   NEUROLOGIC AMS - r/t hypercarbia  Hx anxiety, chronic pain  P:   Sedation per protocol   FAMILY  - Updates:  Wife and son updated at bedside 12/17.    Independent CC time 35 minutes  Baltazar Apo, MD, PhD 11/26/2015, 10:07 AM [facility] [phone] or if no answer [phone]

## 2015-11-26 NOTE — Progress Notes (Signed)
eLink Physician-Brief Progress Note Patient Name: George Leon DOB: 1956/02/23 MRN: [id number]   Date of Service  11/26/2015  HPI/Events of Note  Low potassium   eICU Interventions  replaced     Intervention Category Minor Interventions: Electrolytes abnormality - evaluation and management  Mauri [place], P 11/26/2015, 3:43 AM

## 2015-11-26 NOTE — Progress Notes (Signed)
  Echocardiogram 2D Echocardiogram has been performed.  Darlina Sicilian M 11/26/2015, 9:38 AM

## 2015-11-26 NOTE — Progress Notes (Signed)
Utilization Review Completed.George Leon T12/18/2016  

## 2015-11-26 NOTE — Progress Notes (Addendum)
Spoke with MD about potassium replacements. Patient has received replacement on nights as well. MD made aware but still wants potassium to be replaced. Will continue with plan of care of collect potassium lab after replacements.

## 2015-11-26 NOTE — Progress Notes (Signed)
Dr. Tamala Julian, with [facility], was made aware of pt K+ of 2.5 and Trop 0.11

## 2015-11-27 ENCOUNTER — Inpatient Hospital Stay (HOSPITAL_COMMUNITY): Payer: Medicaid Other

## 2015-11-27 DIAGNOSIS — J189 Pneumonia, unspecified organism: Secondary | ICD-10-CM

## 2015-11-27 DIAGNOSIS — Z0189 Encounter for other specified special examinations: Secondary | ICD-10-CM

## 2015-11-27 DIAGNOSIS — J9600 Acute respiratory failure, unspecified whether with hypoxia or hypercapnia: Secondary | ICD-10-CM | POA: Insufficient documentation

## 2015-11-27 DIAGNOSIS — J9602 Acute respiratory failure with hypercapnia: Secondary | ICD-10-CM

## 2015-11-27 DIAGNOSIS — J9601 Acute respiratory failure with hypoxia: Secondary | ICD-10-CM | POA: Insufficient documentation

## 2015-11-27 LAB — BASIC METABOLIC PANEL
ANION GAP: 9 (ref 5–15)
BUN: 20 mg/dL (ref 6–20)
CHLORIDE: 99 mmol/L — AB (ref 101–111)
CO2: 27 mmol/L (ref 22–32)
Calcium: 9 mg/dL (ref 8.9–10.3)
Creatinine, Ser: 0.72 mg/dL (ref 0.61–1.24)
GFR calc Af Amer: >60 mL/min (ref >60)
GLUCOSE: 181 mg/dL — AB (ref 65–99)
POTASSIUM: 3.2 mmol/L — AB (ref 3.5–5.1)
Sodium: 135 mmol/L (ref 135–145)

## 2015-11-27 LAB — POCT I-STAT 3, ART BLOOD GAS (G3+)
Acid-Base Excess: 7 mmol/L — ABNORMAL HIGH (ref 0.0–2.0)
Bicarbonate: 29.5 mEq/L — ABNORMAL HIGH (ref 20.0–24.0)
O2 SAT: 97 %
PCO2 ART: 33.8 mmHg — AB (ref 35.0–45.0)
PH ART: 7.548 — AB (ref 7.350–7.450)
PO2 ART: 83 mmHg (ref 80.0–100.0)
Patient temperature: 98.7
TCO2: 30 mmol/L (ref 0–100)

## 2015-11-27 LAB — GLUCOSE, CAPILLARY
GLUCOSE-CAPILLARY: 181 mg/dL — AB (ref 65–99)
GLUCOSE-CAPILLARY: 165 mg/dL — AB (ref 65–99)
GLUCOSE-CAPILLARY: 153 mg/dL — AB (ref 65–99)
GLUCOSE-CAPILLARY: 160 mg/dL — AB (ref 65–99)
Glucose-Capillary: 171 mg/dL — ABNORMAL HIGH (ref 65–99)
Glucose-Capillary: 176 mg/dL — ABNORMAL HIGH (ref 65–99)

## 2015-11-27 LAB — CBC
HEMATOCRIT: 38.6 % — AB (ref 39.0–52.0)
HEMOGLOBIN: 12.5 g/dL — AB (ref 13.0–17.0)
MCH: 30.3 pg (ref 26.0–34.0)
MCHC: 32.4 g/dL (ref 30.0–36.0)
MCV: 93.5 fL (ref 78.0–100.0)
Platelets: 302 10*3/uL (ref 150–400)
RBC: 4.13 MIL/uL — ABNORMAL LOW (ref 4.22–5.81)
RDW: 14.2 % (ref 11.5–15.5)
WBC: 21.9 10*3/uL — AB (ref 4.0–10.5)

## 2015-11-27 LAB — PHOSPHORUS: PHOSPHORUS: 3.8 mg/dL (ref 2.5–4.6)

## 2015-11-27 LAB — PROCALCITONIN: PROCALCITONIN: 0.57 ng/mL

## 2015-11-27 LAB — MAGNESIUM: Magnesium: 2.2 mg/dL (ref 1.7–2.4)

## 2015-11-27 MED ORDER — PROPOFOL 1000 MG/100ML IV EMUL
0.0000 ug/kg/min | INTRAVENOUS | Status: DC
  Administered 2015-11-27: 50 ug/kg/min via INTRAVENOUS
  Administered 2015-11-27: 25 ug/kg/min via INTRAVENOUS
  Administered 2015-11-27: 50 ug/kg/min via INTRAVENOUS
  Administered 2015-11-27 (×2): 40 ug/kg/min via INTRAVENOUS
  Administered 2015-11-27 – 2015-11-28 (×2): 50 ug/kg/min via INTRAVENOUS
  Filled 2015-11-27 (×8): qty 100

## 2015-11-27 MED ORDER — METHYLPREDNISOLONE SODIUM SUCC 40 MG IJ SOLR
40.0000 mg | Freq: Two times a day (BID) | INTRAMUSCULAR | Status: DC
  Administered 2015-11-27 – 2015-11-28 (×3): 40 mg via INTRAVENOUS
  Filled 2015-11-27 (×5): qty 1

## 2015-11-27 MED ORDER — BUMETANIDE 0.25 MG/ML IJ SOLN
0.5000 mg | Freq: Two times a day (BID) | INTRAMUSCULAR | Status: DC
  Administered 2015-11-27 (×2): 0.5 mg via INTRAVENOUS
  Filled 2015-11-27 (×5): qty 2

## 2015-11-27 MED ORDER — VITAL HIGH PROTEIN PO LIQD
1000.0000 mL | ORAL | Status: DC
  Administered 2015-11-27 – 2015-11-30 (×4): 1000 mL
  Filled 2015-11-27 (×5): qty 1000

## 2015-11-27 MED ORDER — PRO-STAT SUGAR FREE PO LIQD
60.0000 mL | Freq: Three times a day (TID) | ORAL | Status: DC
  Administered 2015-11-27 – 2015-11-30 (×9): 60 mL
  Filled 2015-11-27 (×11): qty 60

## 2015-11-27 MED ORDER — POTASSIUM CHLORIDE 20 MEQ/15ML (10%) PO SOLN
40.0000 meq | Freq: Once | ORAL | Status: AC
  Administered 2015-11-27: 40 meq
  Filled 2015-11-27: qty 30

## 2015-11-27 NOTE — Progress Notes (Signed)
eLink Physician-Brief Progress Note Patient Name: George Leon DOB: 1956-08-19 MRN: [id number]   Date of Service  11/27/2015  HPI/Events of Note  K+ = 3.2 and Creatinine = 0.72.  eICU Interventions  Will replete K+.     Intervention Category Intermediate Interventions: Electrolyte abnormality - evaluation and management  Gwin Eagon Eugene 11/27/2015, 6:08 AM

## 2015-11-27 NOTE — Progress Notes (Addendum)
Initial Nutrition Assessment  DOCUMENTATION CODES:   Obesity unspecified  INTERVENTION:    Continue TF via OGT with Vital High Protein, change goal rate to 20 ml/h (480 ml per day) with Prostat 60 ml TID to provide 1080 kcals, 132 gm protein, 401 ml free water daily.  Total calorie intake from propofol and TF will be 1777 kcals (slightly exceeding calorie needs to ensure meeting protein needs).  NUTRITION DIAGNOSIS:   Inadequate oral intake related to inability to eat as evidenced by NPO status.  GOAL:   Provide needs based on ASPEN/SCCM guidelines  MONITOR:   Vent status, Labs, Weight trends, TF tolerance, I & O's  REASON FOR ASSESSMENT:   Consult Enteral/tube feeding initiation and management  ASSESSMENT:   59yo male smoker with hx COPD, HTN,CAD presented 12/17 with 3 day hx SOB, non-productive cough, fevers. Being treated for PNA, requiring vent.  Nutrition focused physical exam completed.  No muscle or subcutaneous fat depletion noticed. Patient is obese. Received MD Consult for TF initiation and management. Wife reports that patient was eating well with a good appetite and no weight changes PTA.   Patient is currently intubated on ventilator support MV: 12.6 L/min Temp (24hrs), Avg:98.7 F (37.1 C), Min:98.1 F (36.7 C), Max:99.1 F (37.3 C)  Propofol: 26.4 ml/hr providing 697 kcals per day.   Diet Order:  Diet NPO time specified  Skin:  Reviewed, no issues  Last BM:  12/19  Height:   Ht Readings from Last 1 Encounters:  11/25/15 5' 7.25" (1.708 m)    Weight:   Wt Readings from Last 1 Encounters:  11/27/15 242 lb 4.6 oz (109.9 kg)    Ideal Body Weight:  68 kg  BMI:  Body mass index is 37.67 kg/(m^2).  Estimated Nutritional Needs:   Kcal:  [id number]  Protein:  136-150 gm  Fluid:  2 L  EDUCATION NEEDS:   No education needs identified at this time  Molli Barrows, [place], [place], [place] Pager [phone] After Hours Pager [phone]

## 2015-11-27 NOTE — Research (Signed)
Protocol Title: The [place]IND[place] Study Modifying the Impact of ICU-Associated Neurological Dysfunction: A multicenter, doubleblind, randomized, placebo-controlled trial investigating the effects of intravenously (IV) administered typical and atypical antipsychotics (haloperidol and ziprasidone) on delirium in critically ill patients.   This patient, George Leon, has been consented to the above clinical trial according to FDA regulations, GCP guidelines and [place] SOPs. The study design has been explained to this patient's surrogate, Kandee Keen, by this RN and PI Dr. Titus Mould.  No study procedures have been initiated before consenting of this patient/surrogate. This surrogate was given sufficient time for reading the consent and asking questions. All risks, benefits and options have been thoroughly discussed.  The surrogate demonstrated comprehension. This surrogate/patient was not coerced in any way to participate or to continue participation in this clinical trial. The patient has signed voluntarily at 11:30 am on Nov 27, 2015. This surrogate was given a copy of this consent.   While enrolled in the study and in the ICU, no open label antipsychotic medications can be administered to the patient.   Doreatha Martin, RN  [phone]

## 2015-11-27 NOTE — Care Management Note (Signed)
Case Management Note  Patient Details  Name: George Leon MRN: [id number] Date of Birth: Feb 06, 1956  Subjective/Objective:   COPD exac, PNA                 Action/Plan:  NCM spoke to pt's wife, [name] # [phone]. Wife states pt has cane at home. States pt is a smoker. She has reapplied for Medicaid and states he should qualify this admission. States he did not qualify due to medical bills did not meet maximum for Medicaid to start. Explained NCM will continue to follow up for dc needs.    Expected Discharge Date:                  Expected Discharge Plan:  Home/Self Care  In-House Referral:     Discharge planning Services  CM Consult   Status of Service:  In process, will continue to follow  Medicare Important Message Given:    Date Medicare IM Given:    Medicare IM give by:    Date Additional Medicare IM Given:    Additional Medicare Important Message give by:     If discussed at [place] of Stay Meetings, dates discussed:    Additional Comments:  Erenest Rasher, RN 11/27/2015, 4:17 PM

## 2015-11-27 NOTE — Progress Notes (Signed)
PULMONARY / CRITICAL CARE MEDICINE   Name: George Leon MRN: [id number] DOB: 31-May-1956    ADMISSION DATE:  11/25/2015  REFERRING MD:  EDP  CHIEF COMPLAINT:  Respiratory failure   HISTORY OF PRESENT ILLNESS:   59yo male smoker with hx COPD, HTN,  CAD presented 12/17 with 3 day hx SOB, non-productive cough, fevers. PNA, vent  SUBJECTIVE:  No pressors, remains on high MV  VITAL SIGNS: BP 122/77 mmHg  Pulse 85  Temp(Src) 98.1 F (36.7 C) (Oral)  Resp 26  Ht 5' 7.25" (1.708 m)  Wt 109.9 kg (242 lb 4.6 oz)  BMI 37.67 kg/m2  SpO2 93%  HEMODYNAMICS:    VENTILATOR SETTINGS: Vent Mode:  [-] PRVC FiO2 (%):  [40 %-50 %] 50 % Set Rate:  [26 bmp] 26 bmp Vt Set:  [530 mL] 530 mL PEEP:  [5 cmH20] 5 cmH20 Pressure Support:  [10 cmH20] 10 cmH20 Plateau Pressure:  [21 cmH20-26 cmH20] 21 cmH20  INTAKE / OUTPUT: I/O last 3 completed shifts: In: 3645 [I.V.:1494.8; NG/GT:1300.2; IV Piggyback:850] Out: 3980 [Urine:3980]  PHYSICAL EXAMINATION: General:  Obese, chronically ill appearing male, on MV Neuro:  Sedated 1 to -2, moved all ext equally, FC pos HEENT:  Mm moist, ETT in place Cardiovascular:  s1s2 rrr Lungs:  ronchi bilateral diffuse Abdomen:  Round, soft, non tender  Musculoskeletal:  Warm and dry, 1+ BLE edema 1 plu slowers  LABS:  BMET  Recent Labs Lab 11/26/15 0130 11/26/15 2010 11/27/15 0212  NA 133* 136 135  K 2.5* 3.7 3.2*  CL 95* 99* 99*  CO2 28 28 27   BUN 9 16 20   CREATININE 0.91 0.76 0.72  GLUCOSE 147* 166* 181*    Electrolytes  Recent Labs Lab 11/26/15 0130 11/26/15 1000 11/26/15 2010 11/27/15 0212  CALCIUM 8.4*  --  9.1 9.0  MG  --  2.2  --  2.2  PHOS  --  3.0  --  3.8    CBC  Recent Labs Lab 11/25/15 1111 11/26/15 0130 11/27/15 0212  WBC 23.6* 15.1* 21.9*  HGB 13.0 11.9* 12.5*  HCT 41.4 37.6* 38.6*  PLT 249 226 302    Coag's No results for input(s): APTT, INR in the last 168 hours.  Sepsis Markers  Recent Labs Lab  11/25/15 1123 11/25/15 1620 11/26/15 0130 11/27/15 0212  LATICACIDVEN 1.41 1.3  --   --   PROCALCITON  --  1.13 1.17 0.57    ABG  Recent Labs Lab 11/25/15 1106 11/25/15 1604 11/25/15 1748  PHART 7.196* 7.147* 7.359  PCO2ART 81.1* 91.9* 52.1*  PO2ART 69.0* 349.0* 129.0*    Liver Enzymes No results for input(s): AST, ALT, ALKPHOS, BILITOT, ALBUMIN in the last 168 hours.  Cardiac Enzymes  Recent Labs Lab 11/25/15 1620 11/25/15 1938 11/26/15 0130  TROPONINI 0.08* 0.07* 0.11*    Glucose  Recent Labs Lab 11/26/15 0900 11/26/15 1317 11/26/15 1555 11/26/15 1923 11/26/15 2341 11/27/15 0351  GLUCAP 147* 134* 175* 131* 176* 171*    Imaging Dg Chest Port 1 View  11/27/2015  CLINICAL DATA:  Acute respiratory failure. EXAM: PORTABLE CHEST 1 VIEW COMPARISON:  11/26/2015. FINDINGS: Endotracheal tube, NG tube in stable position. Mediastinum hilar structures are stable. Stable cardiomegaly. Low lung volumes with bibasilar atelectasis and/or infiltrates. No pleural effusion or pneumothorax. IMPRESSION: 1. Lines and tubes in stable position. 2. Low lung volumes with bibasilar atelectasis and/or infiltrates. Electronically Signed   By: Marcello Moores  Register   On: 11/27/2015 07:09  STUDIES:    CULTURES: Flu PCR 12/17 >> negative Blood 12/17 >>  resp 12/17 >>   ANTIBIOTICS: Rocephin 12/17>>>  Azithro 12/17>>>  SIGNIFICANT EVENTS: No fever spikes  LINES/TUBES: ETT 12/17 >>>  DISCUSSION: 59yo male smoker with hx COPD, admitted with multifactorial acute respiratory failure r/t likely AECOPD, viral URI now with PNA +/- decompensated diastolic heart failure likely complicated by undiagnosed OSA and chronic narcs.   ASSESSMENT / PLAN:  PULMONARY Acute hypoxemic and hypercarbic respiratory failure  Suspected CAP  AECOPD  Tobacco abuse  Suspected OSA  P:   Rate remains at 26, r/o alkalosis, obtain abg Then after correction, SBT, PS 10 failed ABG none new BD's -  duonebs, PRN albuterol  IV steroids, reduce pcxr in am   CARDIOVASCULAR ?Acute on chronic diastolic heart failure  Hx HTN Lasix allergy P:  Echo reviewed F/u BNP  Hold home losartan, diltiazem for now  Start 0.5 bumex  RENAL Hypokalemia P:   F/u chem, mg, phos  bumex 0.5 bid k supp esnure kvo  GASTROINTESTINAL Hx GERD  P:   NPO, nutrition consult for TF PPI  lft in am   HEMATOLOGIC Leukocytosis , hemoconcentration? P:  F/u cbc  SQ heparin   INFECTIOUS Suspected CAP  Viral URI, flu negative P:   Rocephin , if remains culture neg dc azithro as above  Pct supports  ENDOCRINE Hyperglycemia - no known hx DM  P:   SSI  NICE goal  NEUROLOGIC AMS - r/t hypercarbia  Hx anxiety, chronic pain  P:   Sedation per protocol Prop, with wua fent prn Chair position  FAMILY  - Updates:  Wife and son updated at bedside 12/17, 12/19   Independent CC time 30 minutes  Lavon Paganini. Titus Mould, MD, [place] Pgr: [facility]

## 2015-11-28 ENCOUNTER — Inpatient Hospital Stay (HOSPITAL_COMMUNITY): Payer: Medicaid Other

## 2015-11-28 ENCOUNTER — Telehealth: Payer: Self-pay | Admitting: Family Medicine

## 2015-11-28 DIAGNOSIS — Z789 Other specified health status: Secondary | ICD-10-CM

## 2015-11-28 DIAGNOSIS — Z978 Presence of other specified devices: Secondary | ICD-10-CM | POA: Insufficient documentation

## 2015-11-28 LAB — CBC WITH DIFFERENTIAL/PLATELET
Basophils Absolute: 0 10*3/uL (ref 0.0–0.1)
Basophils Relative: 0 %
EOS ABS: 0 10*3/uL (ref 0.0–0.7)
Eosinophils Relative: 0 %
HCT: 39.2 % (ref 39.0–52.0)
Hemoglobin: 12.5 g/dL — ABNORMAL LOW (ref 13.0–17.0)
LYMPHS ABS: 2.2 10*3/uL (ref 0.7–4.0)
Lymphocytes Relative: 10 %
MCH: 30.2 pg (ref 26.0–34.0)
MCHC: 31.9 g/dL (ref 30.0–36.0)
MCV: 94.7 fL (ref 78.0–100.0)
Monocytes Absolute: 1.3 10*3/uL — ABNORMAL HIGH (ref 0.1–1.0)
Monocytes Relative: 6 %
NEUTROS ABS: 18.9 10*3/uL — AB (ref 1.7–7.7)
Neutrophils Relative %: 84 %
PLATELETS: 303 10*3/uL (ref 150–400)
RBC: 4.14 MIL/uL — AB (ref 4.22–5.81)
RDW: 14.3 % (ref 11.5–15.5)
WBC: 22.4 10*3/uL — ABNORMAL HIGH (ref 4.0–10.5)

## 2015-11-28 LAB — COMPREHENSIVE METABOLIC PANEL
ALBUMIN: 2.5 g/dL — AB (ref 3.5–5.0)
ALK PHOS: 55 U/L (ref 38–126)
ALT: 16 U/L — ABNORMAL LOW (ref 17–63)
AST: 13 U/L — AB (ref 15–41)
Anion gap: 9 (ref 5–15)
BILIRUBIN TOTAL: 0.3 mg/dL (ref 0.3–1.2)
BUN: 31 mg/dL — AB (ref 6–20)
CALCIUM: 8.9 mg/dL (ref 8.9–10.3)
CO2: 31 mmol/L (ref 22–32)
CREATININE: 0.73 mg/dL (ref 0.61–1.24)
Chloride: 102 mmol/L (ref 101–111)
GFR calc Af Amer: >60 mL/min (ref >60)
GFR calc non Af Amer: >60 mL/min (ref >60)
GLUCOSE: 171 mg/dL — AB (ref 65–99)
Potassium: 4 mmol/L (ref 3.5–5.1)
Sodium: 142 mmol/L (ref 135–145)
TOTAL PROTEIN: 6 g/dL — AB (ref 6.5–8.1)

## 2015-11-28 LAB — TRIGLYCERIDES: Triglycerides: 277 mg/dL — ABNORMAL HIGH (ref <150)

## 2015-11-28 LAB — GLUCOSE, CAPILLARY
GLUCOSE-CAPILLARY: 134 mg/dL — AB (ref 65–99)
GLUCOSE-CAPILLARY: 155 mg/dL — AB (ref 65–99)
GLUCOSE-CAPILLARY: 156 mg/dL — AB (ref 65–99)
GLUCOSE-CAPILLARY: 162 mg/dL — AB (ref 65–99)
Glucose-Capillary: 140 mg/dL — ABNORMAL HIGH (ref 65–99)
Glucose-Capillary: 132 mg/dL — ABNORMAL HIGH (ref 65–99)

## 2015-11-28 LAB — CULTURE, RESPIRATORY W GRAM STAIN

## 2015-11-28 LAB — CULTURE, RESPIRATORY: CULTURE: NO GROWTH

## 2015-11-28 LAB — PATHOLOGIST SMEAR REVIEW

## 2015-11-28 LAB — BRAIN NATRIURETIC PEPTIDE: B Natriuretic Peptide: 153.7 pg/mL — ABNORMAL HIGH (ref 0.0–100.0)

## 2015-11-28 MED ORDER — PROPOFOL 1000 MG/100ML IV EMUL
0.0000 ug/kg/min | INTRAVENOUS | Status: DC
  Administered 2015-11-28: 50 ug/kg/min via INTRAVENOUS
  Administered 2015-11-28: 40 ug/kg/min via INTRAVENOUS
  Administered 2015-11-28: 30 ug/kg/min via INTRAVENOUS
  Administered 2015-11-28: 40 ug/kg/min via INTRAVENOUS
  Administered 2015-11-28: 50 ug/kg/min via INTRAVENOUS
  Administered 2015-11-28: 40 ug/kg/min via INTRAVENOUS
  Administered 2015-11-29: 5 ug/kg/min via INTRAVENOUS
  Administered 2015-11-29 (×2): 40 ug/kg/min via INTRAVENOUS
  Administered 2015-11-30: 25 ug/kg/min via INTRAVENOUS
  Administered 2015-11-30: 30 ug/kg/min via INTRAVENOUS
  Filled 2015-11-28 (×11): qty 100

## 2015-11-28 MED ORDER — DILTIAZEM HCL 30 MG PO TABS
30.0000 mg | ORAL_TABLET | Freq: Two times a day (BID) | ORAL | Status: DC
  Administered 2015-11-28 – 2015-12-01 (×7): 30 mg via ORAL
  Filled 2015-11-28 (×8): qty 1

## 2015-11-28 MED ORDER — STUDY - INVESTIGATIONAL DRUG SIMPLE RECORD (ML)
1.0000 mL | Freq: Two times a day (BID) | Status: DC
  Administered 2015-11-28: 1 mL via INTRAVENOUS
  Filled 2015-11-28: qty 1

## 2015-11-28 MED ORDER — BUMETANIDE 0.25 MG/ML IJ SOLN
1.0000 mg | Freq: Two times a day (BID) | INTRAMUSCULAR | Status: DC
  Administered 2015-11-28 – 2015-12-01 (×7): 1 mg via INTRAVENOUS
  Filled 2015-11-28 (×8): qty 4

## 2015-11-28 MED ORDER — STUDY - INVESTIGATIONAL DRUG SIMPLE RECORD (ML)
0.5000 mL | Freq: Two times a day (BID) | Status: DC
  Administered 2015-11-28: 0.5 mL via INTRAVENOUS
  Filled 2015-11-28: qty 0.5

## 2015-11-28 NOTE — Telephone Encounter (Signed)
Pts wife wanted dr Bonner Puna to know he was in the hospital with double pneumonia Room 67M 15

## 2015-11-28 NOTE — Progress Notes (Signed)
PULMONARY / CRITICAL CARE MEDICINE   Name: George Leon MRN: [id number] DOB: 21-Sep-1956    ADMISSION DATE:  11/25/2015  REFERRING MD:  EDP  CHIEF COMPLAINT:  Respiratory failure   HISTORY OF PRESENT ILLNESS:   59yo male smoker with hx COPD, HTN,  CAD presented 12/17 with 3 day hx SOB, non-productive cough, fevers. PNA, vent  SUBJECTIVE:  [place] enrollment, rass was deep 12/19  VITAL SIGNS: BP 152/88 mmHg  Pulse 80  Temp(Src) 98.5 F (36.9 C) (Oral)  Resp 25  Ht 5' 7.25" (1.708 m)  Wt 108.7 kg (239 lb 10.2 oz)  BMI 37.26 kg/m2  SpO2 94%  HEMODYNAMICS:    VENTILATOR SETTINGS: Vent Mode:  [-] PSV;CPAP FiO2 (%):  [50 %-60 %] 50 % Set Rate:  [14 bmp] 14 bmp Vt Set:  [530 mL] 530 mL PEEP:  [5 cmH20] 5 cmH20 Plateau Pressure:  [17 cmH20-20 cmH20] 18 cmH20  INTAKE / OUTPUT: I/O last 3 completed shifts: In: 2795.5 [I.V.:1425.5; NG/GT:1020; IV Piggyback:350] Out: [id number] [Urine:3330]  PHYSICAL EXAMINATION: General:  Obese, chronically ill appearing male, on MV Neuro:  Sedated rass - 2 HEENT:  jvd wnl, ETT in place Cardiovascular:  s1s2 rrr Lungs:  ronchi  Abdomen:  Round, soft, non tender  Musculoskeletal:  Warm and dry, 1+ BLE edema 1 plu slowers  LABS:  BMET  Recent Labs Lab 11/26/15 2010 11/27/15 0212 11/28/15 0458  NA 136 135 142  K 3.7 3.2* 4.0  CL 99* 99* 102  CO2 28 27 31   BUN 16 20 31*  CREATININE 0.76 0.72 0.73  GLUCOSE 166* 181* 171*    Electrolytes  Recent Labs Lab 11/26/15 1000 11/26/15 2010 11/27/15 0212 11/28/15 0458  CALCIUM  --  9.1 9.0 8.9  MG 2.2  --  2.2  --   PHOS 3.0  --  3.8  --     CBC  Recent Labs Lab 11/26/15 0130 11/27/15 0212 11/28/15 0458  WBC 15.1* 21.9* 22.4*  HGB 11.9* 12.5* 12.5*  HCT 37.6* 38.6* 39.2  PLT 226 302 303    Coag's No results for input(s): APTT, INR in the last 168 hours.  Sepsis Markers  Recent Labs Lab 11/25/15 1123 11/25/15 1620 11/26/15 0130 11/27/15 0212  LATICACIDVEN  1.41 1.3  --   --   PROCALCITON  --  1.13 1.17 0.57    ABG  Recent Labs Lab 11/25/15 1604 11/25/15 1748 11/27/15 0939  PHART 7.147* 7.359 7.548*  PCO2ART 91.9* 52.1* 33.8*  PO2ART 349.0* 129.0* 83.0    Liver Enzymes  Recent Labs Lab 11/28/15 0458  AST 13*  ALT 16*  ALKPHOS 55  BILITOT 0.3  ALBUMIN 2.5*    Cardiac Enzymes  Recent Labs Lab 11/25/15 1620 11/25/15 1938 11/26/15 0130  TROPONINI 0.08* 0.07* 0.11*    Glucose  Recent Labs Lab 11/27/15 1227 11/27/15 1537 11/27/15 2034 11/28/15 0008 11/28/15 0311 11/28/15 0806  GLUCAP 165* 153* 160* 162* 156* 155*    Imaging Dg Chest Port 1 View  11/28/2015  CLINICAL DATA:  Acute respiratory failure. EXAM: PORTABLE CHEST 1 VIEW COMPARISON:  11/27/2015 . FINDINGS: Endotracheal tube and NG tube in good anatomic position. Cardiomegaly with mild pulmonary venous congestion. Low lung volumes with mild bibasilar atelectasis and/or infiltrates. No pleural effusion or pneumothorax. Prior cervical spine fusion . IMPRESSION: 1.  Lines and tubes in stable position. 2.  Low lung volumes with mild bibasilar atelectasis again noted. 3. Cardiomegaly.  Mild pulmonary venous congestion. Electronically Signed  By: [place]   On: 11/28/2015 07:24     STUDIES:    CULTURES: Flu PCR 12/17 >> negative Blood 12/17 >>  resp 12/17 >>   ANTIBIOTICS: Rocephin 12/17>>>  Azithro 12/17>>>  SIGNIFICANT EVENTS: 12/19 No fever spikes 12/19 [place] ENROLLMENT  LINES/TUBES: ETT 12/17 >>>  DISCUSSION: 59yo male smoker with hx COPD, admitted with multifactorial acute respiratory failure r/t likely AECOPD, viral URI now with PNA +/- decompensated diastolic heart failure likely complicated by undiagnosed OSA and chronic narcs.   ASSESSMENT / PLAN:  PULMONARY Acute hypoxemic and hypercarbic respiratory failure  Suspected CAP  AECOPD  Tobacco abuse  Suspected OSA  P:   PS 5 failed, to PS 15, cpap 5, goal 2 hrs pcxr in  am , re assess mid left lung field linear changes Upright, PT consult on vent, can walk if able ABG reviewed, maintain rr 14 BD's - duonebs, PRN albuterol  IV steroids , consider keep for concern viral pneumonitis bumex to neg goals remains  CARDIOVASCULAR ?Acute on chronic diastolic heart failure  Hx HTN Lasix allergy P:  Start short acting diltiazem for now 30 bid bumex to 1 mg bid  RENAL Hypokalemia Neg balance goals P:   F/u chem, mg, phos  bumex 0.5 bid kvo Some green urine  GASTROINTESTINAL Hx GERD  P:   TF plus protein supplement  PPI  lft wnl  HEMATOLOGIC Leukocytosis On roids , hemoconcentration P:  F/u cbc  SQ heparin   INFECTIOUS Suspected CAP vs bronchitis Viral URI, flu negative P:   Rocephin maintain  azithro dc after day 5  Pct supports viral likely  ENDOCRINE Hyperglycemia - no known hx DM  P:   SSI  NICE goal tsh in am  NEUROLOGIC AMS - r/t hypercarbia  Hx anxiety, chronic pain  P:   Sedation per protocol Prop, with wua fent prn Chair position PT consult walk!?  FAMILY  - Updates:  Wife and son updated at bedside 12/17, 12/19, wife is participating on rounds daily fully   Independent CC time 30 minutes  Lavon Paganini. Titus Mould, MD, [place] Pgr: [facility]

## 2015-11-29 ENCOUNTER — Inpatient Hospital Stay (HOSPITAL_COMMUNITY): Payer: Medicaid Other

## 2015-11-29 DIAGNOSIS — J9601 Acute respiratory failure with hypoxia: Principal | ICD-10-CM

## 2015-11-29 LAB — CBC WITH DIFFERENTIAL/PLATELET
Basophils Absolute: 0 10*3/uL (ref 0.0–0.1)
Basophils Relative: 0 %
EOS ABS: 0 10*3/uL (ref 0.0–0.7)
EOS PCT: 0 %
HEMATOCRIT: 41.3 % (ref 39.0–52.0)
Hemoglobin: 12.8 g/dL — ABNORMAL LOW (ref 13.0–17.0)
LYMPHS ABS: 4 10*3/uL (ref 0.7–4.0)
Lymphocytes Relative: 19 %
MCH: 30.1 pg (ref 26.0–34.0)
MCHC: 31 g/dL (ref 30.0–36.0)
MCV: 97.2 fL (ref 78.0–100.0)
MONO ABS: 2.1 10*3/uL — AB (ref 0.1–1.0)
Monocytes Relative: 10 %
Neutro Abs: 14.9 10*3/uL — ABNORMAL HIGH (ref 1.7–7.7)
Neutrophils Relative %: 71 %
PLATELETS: 290 10*3/uL (ref 150–400)
RBC: 4.25 MIL/uL (ref 4.22–5.81)
RDW: 14.4 % (ref 11.5–15.5)
WBC: 21 10*3/uL — AB (ref 4.0–10.5)

## 2015-11-29 LAB — BASIC METABOLIC PANEL
Anion gap: 9 (ref 5–15)
BUN: 36 mg/dL — AB (ref 6–20)
CALCIUM: 9.1 mg/dL (ref 8.9–10.3)
CHLORIDE: 102 mmol/L (ref 101–111)
CO2: 33 mmol/L — ABNORMAL HIGH (ref 22–32)
CREATININE: 0.67 mg/dL (ref 0.61–1.24)
GFR calc non Af Amer: >60 mL/min (ref >60)
Glucose, Bld: 134 mg/dL — ABNORMAL HIGH (ref 65–99)
Potassium: 3.6 mmol/L (ref 3.5–5.1)
SODIUM: 144 mmol/L (ref 135–145)

## 2015-11-29 LAB — TSH: TSH: 0.449 u[IU]/mL (ref 0.350–4.500)

## 2015-11-29 LAB — GLUCOSE, CAPILLARY
GLUCOSE-CAPILLARY: 144 mg/dL — AB (ref 65–99)
GLUCOSE-CAPILLARY: 127 mg/dL — AB (ref 65–99)
Glucose-Capillary: 101 mg/dL — ABNORMAL HIGH (ref 65–99)
Glucose-Capillary: 110 mg/dL — ABNORMAL HIGH (ref 65–99)
Glucose-Capillary: 120 mg/dL — ABNORMAL HIGH (ref 65–99)
Glucose-Capillary: 126 mg/dL — ABNORMAL HIGH (ref 65–99)

## 2015-11-29 LAB — PHOSPHORUS: Phosphorus: 4.3 mg/dL (ref 2.5–4.6)

## 2015-11-29 LAB — MAGNESIUM: MAGNESIUM: 2.4 mg/dL (ref 1.7–2.4)

## 2015-11-29 MED ORDER — STUDY - INVESTIGATIONAL DRUG SIMPLE RECORD (ML)
2.0000 mL | Freq: Two times a day (BID) | Status: DC
  Filled 2015-11-29: qty 2

## 2015-11-29 MED ORDER — HYDRALAZINE HCL 20 MG/ML IJ SOLN
25.0000 mg | Freq: Three times a day (TID) | INTRAMUSCULAR | Status: DC
  Administered 2015-11-29 – 2015-12-01 (×6): 25 mg via INTRAVENOUS
  Filled 2015-11-29: qty 1.25
  Filled 2015-11-29: qty 2
  Filled 2015-11-29 (×5): qty 1.25
  Filled 2015-11-29 (×2): qty 2

## 2015-11-29 MED ORDER — STUDY - INVESTIGATIONAL DRUG SIMPLE RECORD (ML)
2.0000 mL | Freq: Two times a day (BID) | Status: DC
  Administered 2015-11-29: 2 mL via INTRAVENOUS
  Filled 2015-11-29: qty 2

## 2015-11-29 MED ORDER — METHYLPREDNISOLONE SODIUM SUCC 40 MG IJ SOLR
20.0000 mg | Freq: Two times a day (BID) | INTRAMUSCULAR | Status: AC
  Administered 2015-11-29 – 2015-11-30 (×4): 20 mg via INTRAVENOUS
  Filled 2015-11-29 (×4): qty 0.5

## 2015-11-29 MED ORDER — METHYLPREDNISOLONE SODIUM SUCC 40 MG IJ SOLR
20.0000 mg | Freq: Two times a day (BID) | INTRAMUSCULAR | Status: DC
  Filled 2015-11-29: qty 0.5

## 2015-11-29 MED ORDER — DEXTROSE 5 % IV SOLN
500.0000 mg | INTRAVENOUS | Status: DC
  Administered 2015-11-29: 500 mg via INTRAVENOUS

## 2015-11-29 NOTE — Research (Addendum)
Patient given [place] study drug at max dose, per protocol, this morning.  Patient became sedated below RASS, >1 level below RASS goal following administration.  Bedside nursing staff able to turn off other continuous sedation medications.  This RN and Dr. Titus Mould assessed patient and RASS improving toward RASS goal, able to maintain eye contact and follow commands.  Dr. Titus Mould spoke with Dr. Conley Canal of [place] trial.  Determined to HOLD scheduled pm dose of study drug, and new max dose of 32mL if study drug restarted.  To re-assess drug titration tomorrow.  Doreatha Martin, RN

## 2015-11-29 NOTE — Progress Notes (Signed)
PT Cancellation Note  Patient Details Name: George Leon MRN: [id number] DOB: [birth year]   Cancelled Treatment:    Reason Eval/Treat Not Completed: Patient's level of consciousness Pt not able to be aroused at this time with verbal/tactile stimuli or sternal rub. Will follow up next available time.   Marguarite Arbour A Meher Kucinski 11/29/2015, 10:56 AM Wray Kearns, PT, DPT [phone]

## 2015-11-29 NOTE — Progress Notes (Signed)
Utilization review complete. Romari Gasparro RN CCM Case Mgmt phone [phone] 

## 2015-11-29 NOTE — Progress Notes (Addendum)
Referral made to Financial Counselor for follow up on Medicaid.  Jonnie Finner RN CCM Case Mgmt phone [phone]

## 2015-11-29 NOTE — Progress Notes (Signed)
PULMONARY / CRITICAL CARE MEDICINE   Name: George Leon MRN: [id number] DOB: 04-Mar-1956    ADMISSION DATE:  11/25/2015  REFERRING MD:  EDP  CHIEF COMPLAINT:  Respiratory failure   HISTORY OF PRESENT ILLNESS:   59yo male smoker with hx COPD, HTN,  CAD presented 12/17 with 3 day hx SOB, non-productive cough, fevers. PNA, vent  SUBJECTIVE:  On vent, follows some commands. Per nursing he has been more delirious since starting study drug.   VITAL SIGNS: BP 163/94 mmHg  Pulse 72  Temp(Src) 97.5 F (36.4 C) (Oral)  Resp 21  Ht 5' 7.25" (1.708 m)  Wt 238 lb 12.1 oz (108.3 kg)  BMI 37.12 kg/m2  SpO2 96%  HEMODYNAMICS:    VENTILATOR SETTINGS: Vent Mode:  [-] CPAP;PSV FiO2 (%):  [40 %-50 %] 40 % Set Rate:  [14 bmp] 14 bmp Vt Set:  [530 mL] 530 mL PEEP:  [5 cmH20] 5 cmH20 Pressure Support:  [10 cmH20-15 cmH20] 12 cmH20 Plateau Pressure:  [14 cmH20-20 cmH20] 19 cmH20  INTAKE / OUTPUT: I/O last 3 completed shifts: In: 2238 [I.V.:1238; NG/GT:700; IV Piggyback:300] Out: 3875 [Urine:3875]  PHYSICAL EXAMINATION: General:  Obese, chronically ill appearing man, on vent Neuro:  Sedated, rass 0, follows some commands  HEENT:  Mucus membranes moist, ETT in place CV: RRR, no m/g/r Pulm:  Rhonchi bilaterally  Abd: BS+, soft, obese, non tender  Msk:  Warm and dry, trace peripheral edema   LABS:  BMET  Recent Labs Lab 11/27/15 0212 11/28/15 0458 11/29/15 0310  NA 135 142 144  K 3.2* 4.0 3.6  CL 99* 102 102  CO2 27 31 33*  BUN 20 31* 36*  CREATININE 0.72 0.73 0.67  GLUCOSE 181* 171* 134*    Electrolytes  Recent Labs Lab 11/26/15 1000  11/27/15 0212 11/28/15 0458 11/29/15 0310  CALCIUM  --   < > 9.0 8.9 9.1  MG 2.2  --  2.2  --  2.4  PHOS 3.0  --  3.8  --  4.3  < > = values in this interval not displayed.  CBC  Recent Labs Lab 11/27/15 0212 11/28/15 0458 11/29/15 0310  WBC 21.9* 22.4* 21.0*  HGB 12.5* 12.5* 12.8*  HCT 38.6* 39.2 41.3  PLT 302 303  290    Coag's No results for input(s): APTT, INR in the last 168 hours.  Sepsis Markers  Recent Labs Lab 11/25/15 1123 11/25/15 1620 11/26/15 0130 11/27/15 0212  LATICACIDVEN 1.41 1.3  --   --   PROCALCITON  --  1.13 1.17 0.57    ABG  Recent Labs Lab 11/25/15 1604 11/25/15 1748 11/27/15 0939  PHART 7.147* 7.359 7.548*  PCO2ART 91.9* 52.1* 33.8*  PO2ART 349.0* 129.0* 83.0    Liver Enzymes  Recent Labs Lab 11/28/15 0458  AST 13*  ALT 16*  ALKPHOS 55  BILITOT 0.3  ALBUMIN 2.5*    Cardiac Enzymes  Recent Labs Lab 11/25/15 1620 11/25/15 1938 11/26/15 0130  TROPONINI 0.08* 0.07* 0.11*    Glucose  Recent Labs Lab 11/28/15 0806 11/28/15 1145 11/28/15 1606 11/28/15 2016 11/29/15 0014 11/29/15 0338  GLUCAP 155* 134* 140* 132* 126* 110*    Imaging Dg Chest Port 1 View  11/29/2015  CLINICAL DATA:  Pneumonia, respiratory failure, intubated patient. EXAM: PORTABLE CHEST 1 VIEW COMPARISON:  Portable chest x-ray of November 28, 2015 FINDINGS: The lungs are adequately inflated. Confluent alveolar opacity in the left mid lung has become more conspicuous. There is no significant pleural  effusion. The heart is mildly enlarged but stable. The pulmonary vascularity is not engorged. The endotracheal tube tip lies 5 cm above the carina. The esophagogastric tube tip projects below the inferior margin of the image. IMPRESSION: Slight interval worsening of the left mid lung pneumonia. Stable cardiomegaly without pulmonary vascular congestion. The support tubes are in reasonable position. Electronically Signed   By: David  Martinique M.D.   On: 11/29/2015 07:32     STUDIES:    CULTURES: Flu PCR 12/17 >> negative Blood 12/17 >>  resp 12/17 >>   ANTIBIOTICS: Rocephin 12/17>>>  Azithro 12/17>>>  SIGNIFICANT EVENTS: 12/19 No fever spikes 12/19 [place] ENROLLMENT  LINES/TUBES: ETT 12/17 >>>  DISCUSSION: 59yo male smoker with hx COPD, admitted with  multifactorial acute respiratory failure r/t likely AECOPD, viral URI now with PNA +/- decompensated diastolic heart failure likely complicated by undiagnosed OSA and chronic narcs.   ASSESSMENT / PLAN:  PULMONARY Acute hypoxemic and hypercarbic respiratory failure  Suspected CAP  AECOPD  Tobacco abuse  Suspected OSA  P:   Vent support pcxr- worsening of L midlobe PNA? Vs atx Upright, may need to hold off on PT BD's - duonebs, PRN albuterol  IV steroids , consider keep for concern viral pneumonitis, reduce ina m  bumex to neg goals remains 530 14, 40% peep 5, weaning this am cpap 5 ps 10 to goal 5 Follow secretion status for left mid lung infiltrate? Maintain neg balance  CARDIOVASCULAR ?Acute on chronic diastolic heart failure  Hx HTN and sys 150 Lasix allergy P:  diltiazem 30 bid Add hydralazine  bumex 1 mg bid  RENAL Hypokalemia Neg balance goals P:   Chem in AM bumex 1 bid kvo k supp  GASTROINTESTINAL Hx GERD  P:   TF plus protein supplement  PPI  BM noted this am  flexi seal needed Ensure on Vital   HEMATOLOGIC Leukocytosis- on roids  Hemoconcentration P:  CBC in AM SQ heparin   INFECTIOUS Suspected CAP vs bronchitis viral Concern left mid lung zone Viral URI, flu negative NO VAE, NO VAP concern clinically  P:   Rocephin maintain  azithro dc after day 5  Pct supports viral likely Resp virus panel pending f/u cultures No fevers pcxr in am    ENDOCRINE Hyperglycemia - no known hx DM  P:   SSI  NICE goal  NEUROLOGIC AMS - r/t hypercarbia  Hx anxiety, chronic pain delirium   P:   Sedation per protocol Prop, with wua fent prn Chair position PT consult- walk if able, d/w PT ABCDE  FAMILY  - Updates:  Wife and son updated at bedside 12/17, 12/19, wife is participating on rounds daily fully   Albin Felling, MD, MPH Internal Medicine Resident, PGY-II Pager: [phone]    STAFF NOTE: Linwood Dibbles, MD FACP have personally  reviewed patient's available data, including medical history, events of note, physical examination and test results as part of my evaluation. I have discussed with resident/NP and other care providers such as pharmacist, RN and RRT. In addition, I personally evaluated patient and elicited key findings of: more awake, MIND drug needed, appears to not be confused this am , cam score positive but improved, pcxr concern re assess left base in am , wean ps 10 to 5 now, cpap 5, upright, doing well with bumex, continue this, no fevers, wbc from steroids likely, chem in am , WUA, continued mind drug , qtc assessments, wife participating on rounds  The patient is  critically ill with multiple organ systems failure and requires high complexity decision making for assessment and support, frequent evaluation and titration of therapies, application of advanced monitoring technologies and extensive interpretation of multiple databases.   Critical Care Time devoted to patient care services described in this note is 30 Minutes. This time reflects time of care of this signee: Merrie Roof, MD FACP. This critical care time does not reflect procedure time, or teaching time or supervisory time of PA/NP/Med student/Med Resident etc but could involve care discussion time. Rest per NP/medical resident whose note is outlined above and that I agree with   Lavon Paganini. Titus Mould, MD, [place] Pgr: [facility] 11/29/2015 10:07 AM

## 2015-11-29 NOTE — Progress Notes (Signed)
Patient attempted to pull ETT. Despite wearing mittens, patient was able to pull tube holder off.  ETT placement was checked using C02 detector, positive color change.  ETT resecured, RN at bedside.

## 2015-11-30 ENCOUNTER — Inpatient Hospital Stay (HOSPITAL_COMMUNITY): Payer: Medicaid Other

## 2015-11-30 DIAGNOSIS — Z720 Tobacco use: Secondary | ICD-10-CM | POA: Insufficient documentation

## 2015-11-30 DIAGNOSIS — I10 Essential (primary) hypertension: Secondary | ICD-10-CM

## 2015-11-30 DIAGNOSIS — D72829 Elevated white blood cell count, unspecified: Secondary | ICD-10-CM

## 2015-11-30 DIAGNOSIS — J9600 Acute respiratory failure, unspecified whether with hypoxia or hypercapnia: Secondary | ICD-10-CM

## 2015-11-30 DIAGNOSIS — J449 Chronic obstructive pulmonary disease, unspecified: Secondary | ICD-10-CM

## 2015-11-30 DIAGNOSIS — G4733 Obstructive sleep apnea (adult) (pediatric): Secondary | ICD-10-CM

## 2015-11-30 DIAGNOSIS — I2510 Atherosclerotic heart disease of native coronary artery without angina pectoris: Secondary | ICD-10-CM | POA: Insufficient documentation

## 2015-11-30 DIAGNOSIS — F17210 Nicotine dependence, cigarettes, uncomplicated: Secondary | ICD-10-CM | POA: Insufficient documentation

## 2015-11-30 DIAGNOSIS — F17200 Nicotine dependence, unspecified, uncomplicated: Secondary | ICD-10-CM | POA: Insufficient documentation

## 2015-11-30 DIAGNOSIS — G894 Chronic pain syndrome: Secondary | ICD-10-CM

## 2015-11-30 DIAGNOSIS — I5031 Acute diastolic (congestive) heart failure: Secondary | ICD-10-CM

## 2015-11-30 LAB — RESPIRATORY VIRUS PANEL
ADENOVIRUS: NEGATIVE
INFLUENZA A: NEGATIVE
Influenza B: NEGATIVE
Metapneumovirus: NEGATIVE
Parainfluenza 1: NEGATIVE
Parainfluenza 2: NEGATIVE
Parainfluenza 3: NEGATIVE
RESPIRATORY SYNCYTIAL VIRUS B: NEGATIVE
RHINOVIRUS: NEGATIVE
Respiratory Syncytial Virus A: NEGATIVE

## 2015-11-30 LAB — BASIC METABOLIC PANEL
Anion gap: 7 (ref 5–15)
BUN: 32 mg/dL — AB (ref 6–20)
CALCIUM: 8.6 mg/dL — AB (ref 8.9–10.3)
CHLORIDE: 102 mmol/L (ref 101–111)
CO2: 32 mmol/L (ref 22–32)
CREATININE: 0.62 mg/dL (ref 0.61–1.24)
GFR calc Af Amer: >60 mL/min (ref >60)
GFR calc non Af Amer: >60 mL/min (ref >60)
Glucose, Bld: 138 mg/dL — ABNORMAL HIGH (ref 65–99)
Potassium: 3.5 mmol/L (ref 3.5–5.1)
Sodium: 141 mmol/L (ref 135–145)

## 2015-11-30 LAB — CBC WITH DIFFERENTIAL/PLATELET
BASOS PCT: 0 %
Basophils Absolute: 0 10*3/uL (ref 0.0–0.1)
EOS PCT: 0 %
Eosinophils Absolute: 0 10*3/uL (ref 0.0–0.7)
HEMATOCRIT: 42.9 % (ref 39.0–52.0)
Hemoglobin: 13.6 g/dL (ref 13.0–17.0)
LYMPHS ABS: 3.1 10*3/uL (ref 0.7–4.0)
Lymphocytes Relative: 14 %
MCH: 30.4 pg (ref 26.0–34.0)
MCHC: 31.7 g/dL (ref 30.0–36.0)
MCV: 95.8 fL (ref 78.0–100.0)
MONO ABS: 2 10*3/uL — AB (ref 0.1–1.0)
Monocytes Relative: 9 %
NEUTROS ABS: 16.7 10*3/uL — AB (ref 1.7–7.7)
Neutrophils Relative %: 77 %
Platelets: 264 10*3/uL (ref 150–400)
RBC: 4.48 MIL/uL (ref 4.22–5.81)
RDW: 14.3 % (ref 11.5–15.5)
WBC: 21.8 10*3/uL — ABNORMAL HIGH (ref 4.0–10.5)

## 2015-11-30 LAB — PHOSPHORUS: Phosphorus: 4.5 mg/dL (ref 2.5–4.6)

## 2015-11-30 LAB — MAGNESIUM: Magnesium: 2.4 mg/dL (ref 1.7–2.4)

## 2015-11-30 LAB — GLUCOSE, CAPILLARY
GLUCOSE-CAPILLARY: 106 mg/dL — AB (ref 65–99)
GLUCOSE-CAPILLARY: 128 mg/dL — AB (ref 65–99)
GLUCOSE-CAPILLARY: 154 mg/dL — AB (ref 65–99)
Glucose-Capillary: 116 mg/dL — ABNORMAL HIGH (ref 65–99)
Glucose-Capillary: 118 mg/dL — ABNORMAL HIGH (ref 65–99)
Glucose-Capillary: 133 mg/dL — ABNORMAL HIGH (ref 65–99)

## 2015-11-30 LAB — CULTURE, BLOOD (ROUTINE X 2)
CULTURE: NO GROWTH
Culture: NO GROWTH

## 2015-11-30 MED ORDER — ACETYLCYSTEINE 20 % IN SOLN
4.0000 mL | Freq: Three times a day (TID) | RESPIRATORY_TRACT | Status: DC
  Administered 2015-11-30 (×2): 4 mL via RESPIRATORY_TRACT

## 2015-11-30 MED ORDER — POTASSIUM CHLORIDE 20 MEQ/15ML (10%) PO SOLN
40.0000 meq | Freq: Once | ORAL | Status: AC
  Administered 2015-11-30: 40 meq via ORAL
  Filled 2015-11-30 (×2): qty 30

## 2015-11-30 MED ORDER — ACETYLCYSTEINE 10 % IN SOLN
4.0000 mL | Freq: Three times a day (TID) | RESPIRATORY_TRACT | Status: DC
  Filled 2015-11-30 (×4): qty 4

## 2015-11-30 MED ORDER — PREDNISONE 20 MG PO TABS
20.0000 mg | ORAL_TABLET | Freq: Every day | ORAL | Status: DC
  Administered 2015-12-01: 20 mg via ORAL
  Filled 2015-11-30 (×2): qty 1

## 2015-11-30 MED ORDER — HYDROCODONE-ACETAMINOPHEN 5-325 MG PO TABS
2.0000 | ORAL_TABLET | ORAL | Status: DC | PRN
  Administered 2015-11-30 – 2015-12-01 (×4): 2 via ORAL
  Filled 2015-11-30 (×4): qty 2

## 2015-11-30 NOTE — Care Management Note (Signed)
Case Management Note  Patient Details  Name: George Leon MRN: [id number] Date of Birth: [birth year]  Subjective/Objective:                    Action/Plan:PT recommending CIR, Will consult CIR MD. CM will continue to follow.    Expected Discharge Date:                  Expected Discharge Plan:  [place]  In-House Referral:  Financial Counselor  Discharge planning Services  CM Consult  Post Acute Care Choice:    Choice offered to:     DME Arranged:    DME Agency:     HH Arranged:    HH Agency:     Status of Service:  In process, will continue to follow  Medicare Important Message Given:    Date Medicare IM Given:    Medicare IM give by:    Date Additional Medicare IM Given:    Additional Medicare Important Message give by:     If discussed at [place] of Stay Meetings, dates discussed:    Additional Comments:  Delrae Sawyers, RN 11/30/2015, 2:23 PM

## 2015-11-30 NOTE — Progress Notes (Signed)
Nutrition Follow-up  DOCUMENTATION CODES:   Obesity unspecified  INTERVENTION:    Diet advancement as able. RD to monitor adequacy of oral intake and add PO supplements as needed.   NUTRITION DIAGNOSIS:   Inadequate oral intake related to inability to eat as evidenced by NPO status.  Ongoing  GOAL:   Patient will meet greater than or equal to 90% of their needs  Progressing  MONITOR:   Diet advancement, PO intake, Weight trends, Labs  ASSESSMENT:   59yo male smoker with hx COPD, HTN,CAD presented 12/17 with 3 day hx SOB, non-productive cough, fevers. Has been treated for PNA.  Patient was just extubated this AM. TF on hold. Hopeful for diet advancement if respiratory status remains acceptable. Weight down with negative fluid status.  Diet Order:  Diet NPO time specified  Skin:  Reviewed, no issues  Last BM:  12/21  Height:   Ht Readings from Last 1 Encounters:  11/25/15 5' 7.25" (1.708 m)    Weight:   Wt Readings from Last 1 Encounters:  11/30/15 226 lb 10.1 oz (102.8 kg)   11/27/15 242 lb 4.6 oz (109.9 kg)        Ideal Body Weight:  68 kg  BMI:  Body mass index is 35.24 kg/(m^2).  Estimated Nutritional Needs:   Kcal:  2000-2200  Protein:  110-130 gm  Fluid:  2-2.2 L  EDUCATION NEEDS:   No education needs identified at this time  Molli Barrows, [place], [place], [place] Pager [phone] After Hours Pager [phone]

## 2015-11-30 NOTE — Progress Notes (Signed)
Rehab Admissions Coordinator Note:  Patient was screened by Retta Diones for appropriateness for an Inpatient Acute Rehab Consult.  At this time, we are recommending Inpatient Rehab consult.  Retta Diones 11/30/2015, 1:58 PM  I can be reached at [phone].

## 2015-11-30 NOTE — Progress Notes (Signed)
PULMONARY / CRITICAL CARE MEDICINE   Name: ROBBY BURROUGHS MRN: [id number] DOB: Sep 21, 1956    ADMISSION DATE:  11/25/2015  REFERRING MD:  EDP  CHIEF COMPLAINT:  Respiratory failure   HISTORY OF PRESENT ILLNESS:   59yo male smoker with hx COPD, HTN,  CAD presented 12/17 with 3 day hx SOB, non-productive cough, fevers. PNA, vent  SUBJECTIVE:  Wide awake, on vent organized, cam neg  VITAL SIGNS: BP 125/70 mmHg  Pulse 86  Temp(Src) 99 F (37.2 C) (Oral)  Resp 23  Ht 5' 7.25" (1.708 m)  Wt 102.8 kg (226 lb 10.1 oz)  BMI 35.24 kg/m2  SpO2 94%  HEMODYNAMICS:    VENTILATOR SETTINGS: Vent Mode:  [-] PSV;CPAP FiO2 (%):  [40 %-50 %] 50 % Set Rate:  [14 bmp] 14 bmp Vt Set:  [530 mL] 530 mL PEEP:  [5 cmH20] 5 cmH20 Pressure Support:  [5 cmH20] 5 cmH20 Plateau Pressure:  [14 cmH20-23 cmH20] 15 cmH20  INTAKE / OUTPUT: I/O last 3 completed shifts: In: 2037.7 [I.V.:917.7; NG/GT:820; IV Piggyback:300] Out: 3300 [Urine:3300]  PHYSICAL EXAMINATION: General:  Obese, chronically ill appearing man, on vent, wide awake Neuro:  Sawake, o x 4, nonfocal HEENT:  Mucus membranes moist, ETT in place CV: RRR, no m/g/r Pulm:  CTA with volumes over 1 liter Abd: BS+, soft, obese, non tender  Msk:  Warm and dry, trace peripheral edema   LABS:  BMET  Recent Labs Lab 11/28/15 0458 11/29/15 0310 11/30/15 0220  NA 142 144 141  K 4.0 3.6 3.5  CL 102 102 102  CO2 31 33* 32  BUN 31* 36* 32*  CREATININE 0.73 0.67 0.62  GLUCOSE 171* 134* 138*    Electrolytes  Recent Labs Lab 11/27/15 0212 11/28/15 0458 11/29/15 0310 11/30/15 0220  CALCIUM 9.0 8.9 9.1 8.6*  MG 2.2  --  2.4 2.4  PHOS 3.8  --  4.3 4.5    CBC  Recent Labs Lab 11/28/15 0458 11/29/15 0310 11/30/15 0220  WBC 22.4* 21.0* 21.8*  HGB 12.5* 12.8* 13.6  HCT 39.2 41.3 42.9  PLT 303 290 264    Coag's No results for input(s): APTT, INR in the last 168 hours.  Sepsis Markers  Recent Labs Lab  11/25/15 1123 11/25/15 1620 11/26/15 0130 11/27/15 0212  LATICACIDVEN 1.41 1.3  --   --   PROCALCITON  --  1.13 1.17 0.57    ABG  Recent Labs Lab 11/25/15 1604 11/25/15 1748 11/27/15 0939  PHART 7.147* 7.359 7.548*  PCO2ART 91.9* 52.1* 33.8*  PO2ART 349.0* 129.0* 83.0    Liver Enzymes  Recent Labs Lab 11/28/15 0458  AST 13*  ALT 16*  ALKPHOS 55  BILITOT 0.3  ALBUMIN 2.5*    Cardiac Enzymes  Recent Labs Lab 11/25/15 1620 11/25/15 1938 11/26/15 0130  TROPONINI 0.08* 0.07* 0.11*    Glucose  Recent Labs Lab 11/29/15 1125 11/29/15 1543 11/29/15 2026 11/30/15 0005 11/30/15 0320 11/30/15 0813  GLUCAP 120* 144* 127* 154* 128* 116*    Imaging Dg Chest Port 1 View  11/30/2015  CLINICAL DATA:  59 year old male with respiratory failure. Community-acquired pneumonia. Initial encounter. EXAM: PORTABLE CHEST 1 VIEW COMPARISON:  11/29/2015 and earlier. FINDINGS: Portable AP semi upright view at 0500 hours. Endotracheal tube tip is stable just below the clavicles. Enteric tube courses to the abdomen, tip not included. Partially visible ACDF hardware. Stable ventilation since 11/28/2015, with platelike left mid lung opacity most resembling atelectasis. No pneumothorax, pulmonary edema, pleural effusion, or  consolidation. IMPRESSION: 1.  Stable lines and tubes. 2. Platelike left lung opacity persists, favor atelectasis. No other acute pulmonary process. Electronically Signed   By: Genevie Ann M.D.   On: 11/30/2015 07:27     STUDIES:    CULTURES: Flu PCR 12/17 >> negative Blood 12/17 >>  resp 12/17 >>   ANTIBIOTICS: Rocephin 12/17>>>  Azithro 12/17>>>  SIGNIFICANT EVENTS: 12/19 No fever spikes 12/19 MIND [place] ENROLLMENT 12/21- oversedated after MIND DRUG, weaned well  LINES/TUBES: ETT 12/17 >>>  DISCUSSION: 59yo male smoker with hx COPD, admitted with multifactorial acute respiratory failure r/t likely AECOPD, viral URI now with PNA +/- decompensated  diastolic heart failure likely complicated by undiagnosed OSA and chronic narcs.   ASSESSMENT / PLAN:  PULMONARY Acute hypoxemic and hypercarbic respiratory failure  Suspected CAP  AECOPD  Tobacco abuse  Suspected OSA  pulm edema component Linear atx P:   Vent support Add mucomysts, chest pt to left IV steroids taper to off bumex to neg goals remains, maintain unlikely to benefit bronch , has some more linear atx left base, great cough weaning cpap 5 ps 5 goal 30 min   CARDIOVASCULAR ?Acute on chronic diastolic heart failure  Hx HTN and sys 150 Lasix allergy P:  diltiazem 30 bid Hydralazine, may need increase, seems controlled overall bumex 1 mg bid, keep  RENAL Hypokalemia Neg balance goals P:   Chem in AM bumex 1 bid, maintain kvo k supp  GASTROINTESTINAL Hx GERD  P:   TF plus protein supplement - hold weaning PPI  BM again, yeah! flexi seal needed Ensure on Vital  Dc flexiseal  HEMATOLOGIC Leukocytosis- on roids  Hemoconcentration P:  CBC in AM SQ heparin  Reducing steroids  INFECTIOUS Suspected CAP vs bronchitis viral Concern left mid lung zone Viral URI, flu negative NO VAE, NO VAP concern clinically  P:   Rocephin maintain to stop date azithro dc after day 5   ENDOCRINE Hyperglycemia - no known hx DM  P:   SSI  NICE goal  NEUROLOGIC AMS - r/t hypercarbia  Hx anxiety, chronic pain delirium  Improved major after mind [place] drug? P:   Holding MIND [place] drug given oversedation noted and possible delay to extubation affect Prop, with wua fent prn Chair position  FAMILY  - Updates:  Wife and son updated at bedside 12/17, 12/19, wife is participating on rounds daily fully  Ccm time 30 min   Lavon Paganini. Titus Mould, MD, [place] Pgr: [facility] 11/30/2015 9:42 AM

## 2015-11-30 NOTE — Evaluation (Signed)
Physical Therapy Evaluation Patient Details Name: George Leon MRN: [id number] DOB: [birth year] Today's Date: 11/30/2015   History of Present Illness  Patient is a 59 y/o male with hx of COPD, HTN and CAD presents SOB, non-productive cough and fevers found to have AMS from hypoxia/hypercapnia. Found to have PNA. Intubated 12/20-12/22.  Clinical Impression  Patient presents with functional limitations due to deficits listed in PT problem list (see below). Pt with generalized weakness, confusion, balance deficits and impaired mobility due to hospitalization. Sp02 dropped to mid 80s on 6L/min 02. Able to recover quickly with rest and cues for breathing. Tolerated SPT to chair with Mod A of 2 for safety. Pt Mod I with SPC PTA. Would benefit from CIR to maximize independence and mobility prior to return home with support from wife.     Follow Up Recommendations CIR    Equipment Recommendations  Rolling walker with 5" wheels    Recommendations for Other Services OT consult;Rehab consult     Precautions / Restrictions Precautions Precautions: Fall Precaution Comments: monitor 02 sats; flexiseal Restrictions Weight Bearing Restrictions: No      Mobility  Bed Mobility Overal bed mobility: Needs Assistance Bed Mobility: Rolling;Sidelying to Sit Rolling: Min assist Sidelying to sit: Mod assist;+2 for physical assistance;HOB elevated       General bed mobility comments: Cues for sequening. Assist with LES, scooting bottom and to elevate trunk.   Transfers Overall transfer level: Needs assistance Equipment used: Rolling walker (2 wheeled) Transfers: Sit to/from [facility] Sit to Stand: Min assist Stand pivot transfers: Mod assist       General transfer comment: Min A to boost from EOB with cues for hand placement/technique. Posterior lean in standing. SPT bed to chair with Mod A for balance, pt with uncontrolled sit into chair. Sp02 dropped to high 80s on 6L/min  02. Cues for pursed lip breathing.  Ambulation/Gait                Stairs            Wheelchair Mobility    Modified Rankin (Stroke Patients Only)       Balance Overall balance assessment: Needs assistance Sitting-balance support: Feet supported;Bilateral upper extremity supported Sitting balance-Leahy Scale: Fair Sitting balance - Comments: Requires Min A-min guard assist sitting EOB- more assist with increased time due to fatigue. Postural control: Posterior lean Standing balance support: During functional activity Standing balance-Leahy Scale: Poor Standing balance comment: Relient on RW and Min A for static/dynamic standing balance. Able to stand for peri care ~30 secs with assist.                              Pertinent Vitals/Pain Pain Assessment: Faces Faces Pain Scale: Hurts even more Pain Location: bottom during pericare Pain Descriptors / Indicators: Sore Pain Intervention(s): Monitored during session;Repositioned    Home Living Family/patient expects to be discharged to:: Private residence Living Arrangements: Spouse/significant other Available Help at Discharge: Family Type of Home: House Home Access: Level entry     Home Layout: One level Home Equipment: Cane - single point      Prior Function Level of Independence: Independent with assistive device(s)         Comments: Pt using SPC PTA. Reports leg giving out at times due to weakness in knees.      Hand Dominance        Extremity/Trunk Assessment   Upper Extremity Assessment:  Defer to OT evaluation;Generalized weakness           Lower Extremity Assessment: Generalized weakness         Communication   Communication: No difficulties  Cognition Arousal/Alertness: Awake/alert Behavior During Therapy: WFL for tasks assessed/performed Overall Cognitive Status: Impaired/Different from baseline                 General Comments: Seems confused. Not  answering questions appropriately. "I am so confused." Able to state month with cues, year and hospital.    General Comments General comments (skin integrity, edema, etc.): Spouse present during session.    Exercises        Assessment/Plan    PT Assessment Patient needs continued PT services  PT Diagnosis Difficulty walking;Generalized weakness   PT Problem List Decreased strength;Cardiopulmonary status limiting activity;Decreased activity tolerance;Decreased balance;Decreased mobility;Decreased safety awareness;Decreased cognition;Pain  PT Treatment Interventions Balance training;Gait training;Functional mobility training;Therapeutic activities;Therapeutic exercise;Patient/family education   PT Goals (Current goals can be found in the Care Plan section) Acute Rehab PT Goals Patient Stated Goal: to go home PT Goal Formulation: With patient Time For Goal Achievement: 12/14/15 Potential to Achieve Goals: Good    Frequency Min 3X/week   Barriers to discharge        Co-evaluation               End of Session Equipment Utilized During Treatment: Gait belt;Oxygen Activity Tolerance: Patient tolerated treatment well Patient left: in chair;with call bell/phone within reach;with family/visitor present Nurse Communication: Mobility status         Time: 1110-1145 PT Time Calculation (min) (ACUTE ONLY): 35 min   Charges:   PT Evaluation $Initial PT Evaluation Tier I: 1 Procedure PT Treatments $Therapeutic Activity: 8-22 mins   PT G Codes:        Bev Drennen A Hatsuko Bizzarro 11/30/2015, 12:25 PM Wray Kearns, [place], DPT [phone]

## 2015-11-30 NOTE — Procedures (Signed)
Extubation Procedure Note  Patient Details:   Name: George Leon DOB: 1956-09-08 MRN: [id number]   Airway Documentation:     Evaluation  O2 sats: stable throughout Complications: No apparent complications Patient did tolerate procedure well. Bilateral Breath Sounds: Diminished, Rhonchi Suctioning: Airway Yes   Patient extubated to 6 LNC and SAT currently 90-92%. Able to vocalize and clear secretions. RT to monitor as needed  Saunders Glance 11/30/2015, 10:21 AM

## 2015-11-30 NOTE — Consult Note (Signed)
Physical Medicine and Rehabilitation Consult Reason for Consult: Debilitation after viral pneumonia/hypoxia Referring Physician: Critical care   HPI: George Leon is a 59 y.o. right handed male with history of CAD maintain on aspirin, hypertension, COPD, tobacco abuse. Patient lives with spouse independent with single-point cane prior to admission. One level home with one-step entry. Pt lives with his wife who works during the day. Presented 11/25/2015 with 3 day history of shortness of breath nonproductive cough and low-grade fever with lower extremity edema. Developed significant respiratory distress and hypoxia while in the ED placed on BiPAP and later intubated. Chest x-ray showed interstitial opacities bilaterally. Decompensated diastolic heart failure and placed on low dose diuretic. Maintain on broad-spectrum antibiotics. Follow-up chest x-ray 11/30/2015 improved. Subcutaneous heparin for DVT prophylaxis. Patient was extubated 11/30/2015. Physical therapy evaluation completed 11/30/2015 with recommendations of physical medicine rehabilitation consult.  Review of Systems  HENT: Negative for hearing loss.   Eyes: Negative for blurred vision and double vision.  Respiratory: Positive for cough and shortness of breath.   Cardiovascular: Positive for leg swelling. Negative for chest pain and palpitations.  Gastrointestinal: Positive for constipation. Negative for nausea and vomiting.  Genitourinary: Negative for dysuria and hematuria.  Musculoskeletal: Positive for myalgias and joint pain.  Skin: Negative for rash.  Neurological: Positive for weakness. Negative for seizures, loss of consciousness and headaches.  All other systems reviewed and are negative.  Past Medical History  Diagnosis Date  . Hypertension   . Acute MI ([place])   . Chronic pain   . COPD (chronic obstructive pulmonary disease) [facility])    Past Surgical History  Procedure Laterality Date  . Joint replacement      . Back surgery    . Cervical spine surgery  1988   History reviewed. No pertinent family history. Social History:  reports that he has been smoking Cigarettes.  He has been smoking about 1.00 pack per day. He has never used smokeless tobacco. He reports that he does not drink alcohol or use illicit drugs. Allergies:  Allergies  Allergen Reactions  . Adhesive [Tape] Rash    Pulled skin off also  . Augmentin [Amoxicillin-Pot Clavulanate] Diarrhea  . Furosemide Nausea Only  . Hydrochlorothiazide Other (See Comments)    Back Pain & gives him kidney stones  . Sulfamethoxazole Swelling    Tongue Swelling   Medications Prior to Admission  Medication Sig Dispense Refill  . albuterol (PROAIR HFA) 108 (90 BASE) MCG/ACT inhaler Inhale 1-2 puffs into the lungs every 4 (four) hours as needed. As needed for shortness of breath /wheeze/cough 3 each 2  . amLODipine (NORVASC) 10 MG tablet Take 0.5 tablets (5 mg total) by mouth daily. 30 tablet 1  . aspirin EC 81 MG tablet Take 1 tablet (81 mg total) by mouth daily.    . budesonide-formoterol (SYMBICORT) 160-4.5 MCG/ACT inhaler Inhale 2 puffs into the lungs 2 (two) times daily. 1 Inhaler 12  . dextromethorphan (DELSYM) 30 MG/5ML liquid Take 30 mg by mouth as needed for cough.     . diazepam (VALIUM) 5 MG tablet Take 1 tablet (5 mg total) by mouth every 12 (twelve) hours as needed for anxiety. (Patient taking differently: Take 2.5-5 mg by mouth every 12 (twelve) hours as needed for anxiety. He takes a 0.5 tablet before he eats per wife. It helps him relax so that he can swallow his food.) 60 tablet 2  . diltiazem (TAZTIA XT) 180 MG 24 hr capsule Take 1  capsule (180 mg total) by mouth daily. 90 capsule 3  . esomeprazole (NEXIUM) 40 MG capsule Take 1 capsule (40 mg total) by mouth daily as needed. indigestion 90 capsule 3  . fish oil-omega-3 fatty acids 1000 MG capsule Take 1 g by mouth 2 (two) times daily.    Marland Kitchen HYDROcodone-acetaminophen (NORCO) 5-325 MG  tablet Take 2 tablets by mouth every 4 (four) hours as needed for moderate pain. 240 tablet 0  . losartan (COZAAR) 50 MG tablet Take 1 tablet (50 mg total) by mouth daily. 90 tablet 3  . metoprolol (TOPROL XL) 200 MG 24 hr tablet Take 1 tablet (200 mg total) by mouth daily. 90 tablet 3  . montelukast (SINGULAIR) 4 MG chewable tablet Chew 1 tablet (4 mg total) by mouth at bedtime. 30 tablet 0  . Multiple Vitamins-Minerals (OCUVITE ADULT FORMULA) CAPS Take 1 capsule by mouth daily. 90 capsule 3  . rosuvastatin (CRESTOR) 10 MG tablet Take 1 tablet (10 mg total) by mouth daily. Substitute for Lipitor 90 tablet 3  . atorvastatin (LIPITOR) 80 MG tablet Take 1 tablet (80 mg total) by mouth daily. (Patient not taking: Reported on 11/25/2015) 90 tablet 3  . Elastic Bandages & Supports (MEDICAL COMPRESSION STOCKINGS) MISC 1 each by Does not apply route once. 2 each 0    Home: Home Living Family/patient expects to be discharged to:: Private residence Living Arrangements: Spouse/significant other Available Help at Discharge: Family Type of Home: House Home Access: Level entry Home Layout: One [place]: [place] - single point  Functional History: Prior Function Level of Independence: Independent with assistive device(s) Comments: Pt using SPC PTA. Reports leg giving out at times due to weakness in knees.  Functional Status:  Mobility: Bed Mobility Overal bed mobility: Needs Assistance Bed Mobility: Rolling, Sidelying to Sit Rolling: Min assist Sidelying to sit: Mod assist, +2 for physical assistance, HOB elevated General bed mobility comments: Cues for sequening. Assist with LES, scooting bottom and to elevate trunk.  Transfers Overall transfer level: Needs assistance Equipment used: Rolling walker (2 wheeled) Transfers: Sit to/from Stand, [employer] Transfers Sit to Stand: Min assist Stand pivot transfers: Mod assist General transfer comment: Min A to boost from EOB with cues for  hand placement/technique. Posterior lean in standing. SPT bed to chair with Mod A for balance, pt with uncontrolled sit into chair. Sp02 dropped to high 80s on 6L/min 02. Cues for pursed lip breathing.      ADL:    Cognition: Cognition Overall Cognitive Status: Impaired/Different from baseline Orientation Level: Oriented to person, Oriented to place, Oriented to time, Disoriented to situation Cognition Arousal/Alertness: Awake/alert Behavior During Therapy: WFL for tasks assessed/performed Overall Cognitive Status: Impaired/Different from baseline General Comments: Seems confused. Not answering questions appropriately. "I am so confused." Able to state month with cues, year and hospital.  Blood pressure 99/71, pulse 98, temperature 98 F (36.7 C), temperature source Oral, resp. rate 11, height 5' 7.25" (1.708 m), weight 102.8 kg (226 lb 10.1 oz), SpO2 92 %. Physical Exam  Vitals reviewed. Constitutional: He is oriented to person, place, and time. He appears well-developed and well-nourished.  HENT:  Head: Normocephalic and atraumatic.  Eyes: Conjunctivae and EOM are normal.  Neck: Normal range of motion. Neck supple. No thyromegaly present.  Cardiovascular: Regular rhythm.   Tachycardic  Respiratory: Effort normal and breath sounds normal.  Good inspiratory effort +Andrew  GI: Soft. Bowel sounds are normal. He exhibits no distension.  Genitourinary:  +Rectal tube  Musculoskeletal:  He exhibits edema (LUE) and tenderness (Joints).  Neurological: He is alert and oriented to person, place, and time.  Makes good eye contact with examiner.  Follows simple commands.  Limited awareness of deficits 3+ reflexes b/l LE Sensation intact to light touch Motor: 4/5 proximal to distal throughout  Skin: Skin is warm and dry.  Scattered abrasions  Psychiatric: His affect is labile. His speech is tangential. Cognition and memory are impaired. He expresses inappropriate judgment. He is  inattentive.    Results for orders placed or performed during the hospital encounter of 11/25/15 (from the past 24 hour(s))  Glucose, capillary     Status: Abnormal   Collection Time: 11/29/15  3:43 PM  Result Value Ref Range   Glucose-Capillary 144 (H) 65 - 99 mg/dL  Glucose, capillary     Status: Abnormal   Collection Time: 11/29/15  8:26 PM  Result Value Ref Range   Glucose-Capillary 127 (H) 65 - 99 mg/dL  Glucose, capillary     Status: Abnormal   Collection Time: 11/30/15 12:05 AM  Result Value Ref Range   Glucose-Capillary 154 (H) 65 - 99 mg/dL  Basic metabolic panel     Status: Abnormal   Collection Time: 11/30/15  2:20 AM  Result Value Ref Range   Sodium 141 135 - 145 mmol/L   Potassium 3.5 3.5 - 5.1 mmol/L   Chloride 102 101 - 111 mmol/L   CO2 32 22 - 32 mmol/L   Glucose, Bld 138 (H) 65 - 99 mg/dL   BUN 32 (H) 6 - 20 mg/dL   Creatinine, Ser 0.62 0.61 - 1.24 mg/dL   Calcium 8.6 (L) 8.9 - 10.3 mg/dL   GFR calc non Af Amer >60 >60 mL/min   GFR calc Af Amer >60 >60 mL/min   Anion gap 7 5 - 15  CBC with Differential/Platelet     Status: Abnormal   Collection Time: 11/30/15  2:20 AM  Result Value Ref Range   WBC 21.8 (H) 4.0 - 10.5 K/uL   RBC 4.48 4.22 - 5.81 MIL/uL   Hemoglobin 13.6 13.0 - 17.0 g/dL   HCT 42.9 39.0 - 52.0 %   MCV 95.8 78.0 - 100.0 fL   MCH 30.4 26.0 - 34.0 pg   MCHC 31.7 30.0 - 36.0 g/dL   RDW 14.3 11.5 - 15.5 %   Platelets 264 150 - 400 K/uL   Neutrophils Relative % 77 %   Lymphocytes Relative 14 %   Monocytes Relative 9 %   Eosinophils Relative 0 %   Basophils Relative 0 %   Neutro Abs 16.7 (H) 1.7 - 7.7 K/uL   Lymphs Abs 3.1 0.7 - 4.0 K/uL   Monocytes Absolute 2.0 (H) 0.1 - 1.0 K/uL   Eosinophils Absolute 0.0 0.0 - 0.7 K/uL   Basophils Absolute 0.0 0.0 - 0.1 K/uL   RBC Morphology POLYCHROMASIA PRESENT    WBC Morphology ATYPICAL LYMPHOCYTES   Magnesium     Status: None   Collection Time: 11/30/15  2:20 AM  Result Value Ref Range    Magnesium 2.4 1.7 - 2.4 mg/dL  Phosphorus     Status: None   Collection Time: 11/30/15  2:20 AM  Result Value Ref Range   Phosphorus 4.5 2.5 - 4.6 mg/dL  Glucose, capillary     Status: Abnormal   Collection Time: 11/30/15  3:20 AM  Result Value Ref Range   Glucose-Capillary 128 (H) 65 - 99 mg/dL  Glucose, capillary     Status:  Abnormal   Collection Time: 11/30/15  8:13 AM  Result Value Ref Range   Glucose-Capillary 116 (H) 65 - 99 mg/dL  Glucose, capillary     Status: Abnormal   Collection Time: 11/30/15 11:15 AM  Result Value Ref Range   Glucose-Capillary 118 (H) 65 - 99 mg/dL   Dg Chest Port 1 View  11/30/2015  CLINICAL DATA:  59 year old male with respiratory failure. Community-acquired pneumonia. Initial encounter. EXAM: PORTABLE CHEST 1 VIEW COMPARISON:  11/29/2015 and earlier. FINDINGS: Portable AP semi upright view at 0500 hours. Endotracheal tube tip is stable just below the clavicles. Enteric tube courses to the abdomen, tip not included. Partially visible ACDF hardware. Stable ventilation since 11/28/2015, with platelike left mid lung opacity most resembling atelectasis. No pneumothorax, pulmonary edema, pleural effusion, or consolidation. IMPRESSION: 1.  Stable lines and tubes. 2. Platelike left lung opacity persists, favor atelectasis. No other acute pulmonary process. Electronically Signed   By: Genevie Ann M.D.   On: 11/30/2015 07:27   Dg Chest Port 1 View  11/29/2015  CLINICAL DATA:  Pneumonia, respiratory failure, intubated patient. EXAM: PORTABLE CHEST 1 VIEW COMPARISON:  Portable chest x-ray of November 28, 2015 FINDINGS: The lungs are adequately inflated. Confluent alveolar opacity in the left mid lung has become more conspicuous. There is no significant pleural effusion. The heart is mildly enlarged but stable. The pulmonary vascularity is not engorged. The endotracheal tube tip lies 5 cm above the carina. The esophagogastric tube tip projects below the inferior margin of the  image. IMPRESSION: Slight interval worsening of the left mid lung pneumonia. Stable cardiomegaly without pulmonary vascular congestion. The support tubes are in reasonable position. Electronically Signed   By: David  Martinique M.D.   On: 11/29/2015 07:32    Assessment/Plan: Diagnosis: Debilitation after viral pneumonia/hypoxia Labs and images independently reviewed.  Records reviewed and summated above.  1. Does the need for close, 24 hr/day medical supervision in concert with the patient's rehab needs make it unreasonable for this patient to be served in a less intensive setting? Yes  2. Co-Morbidities requiring supervision/potential complications: CAD s/p MI (cont meds), COPD (cont to monitor RR and SATs in accordance with increased physical activity), tobacco abuse (cont counseling), HTN (monitor and provide prns in accordance with increased physical exertion and pain), chronic pain (Biofeedback training with therapies to help reduce reliance on opiate pain medications, monitor pain control during therapies, and sedation at rest and titrate to maximum efficacy to ensure participation and gains in therapies), leukocytosis (in large part likely secondary to steroids, cont to monitor), decompensated diastolic heart failure (Monitor in accordance with increased physical activity and avoid UE resistance excercises), suspected OSA (consider CPAP trial) 3. Due to bladder management, bowel management, safety, skin/wound care, disease management, medication administration, pain management and patient education, does the patient require 24 hr/day rehab nursing? Yes 4. Does the patient require coordinated care of a physician, rehab nurse, PT (1-2 hrs/day, 5 days/week), OT (1-2 hrs/day, 5 days/week) and SLP (1-2 hrs/day, 5 days/week) to address physical and functional deficits in the context of the above medical diagnosis(es)? Yes Addressing deficits in the following areas: balance, endurance, locomotion, strength,  transferring, bowel/bladder control, bathing, dressing, feeding, grooming, toileting, cognition, language, swallowing and psychosocial support 5. Can the patient actively participate in an intensive therapy program of at least 3 hrs of therapy per day at least 5 days per week? Potentially 6. The potential for patient to make measurable gains while on inpatient rehab is excellent 7.  Anticipated functional outcomes upon discharge from inpatient rehab are supervision and min assist  with PT, modified independent and supervision with OT, modified independent and supervision with SLP. 8. Estimated rehab length of stay to reach the above functional goals is: 16-19 days. 9. Does the patient have adequate social supports and living environment to accommodate these discharge functional goals? Potentially 10. Anticipated D/C setting: Home 11. Anticipated post D/C treatments: HH therapy and Home excercise program 12. Overall Rehab/Functional Prognosis: excellent  RECOMMENDATIONS: This patient's condition is appropriate for continued rehabilitative care in the following setting: Possible CIR once acute medication issues have resolved and pt's discharge disposition is confirmed Patient has agreed to participate in recommended program. Potentially Note that insurance prior authorization may be required for reimbursement for recommended care.  Comment: Rehab Admissions Coordinator to follow up   Delice Lesch, MD 11/30/2015

## 2015-12-01 ENCOUNTER — Inpatient Hospital Stay (HOSPITAL_COMMUNITY)
Admission: RE | Admit: 2015-12-01 | Discharge: 2015-12-09 | DRG: 947 | Disposition: A | Discharge status: Home Health | Payer: Medicaid Other | Source: Intra-hospital | Attending: Physical Medicine & Rehabilitation | Admitting: Physical Medicine & Rehabilitation

## 2015-12-01 ENCOUNTER — Encounter (HOSPITAL_COMMUNITY): Payer: Self-pay | Admitting: Physical Medicine & Rehabilitation

## 2015-12-01 ENCOUNTER — Inpatient Hospital Stay (HOSPITAL_COMMUNITY): Payer: Medicaid Other

## 2015-12-01 DIAGNOSIS — R0989 Other specified symptoms and signs involving the circulatory and respiratory systems: Secondary | ICD-10-CM | POA: Diagnosis present

## 2015-12-01 DIAGNOSIS — R030 Elevated blood-pressure reading, without diagnosis of hypertension: Secondary | ICD-10-CM | POA: Diagnosis not present

## 2015-12-01 DIAGNOSIS — G894 Chronic pain syndrome: Secondary | ICD-10-CM | POA: Diagnosis not present

## 2015-12-01 DIAGNOSIS — J449 Chronic obstructive pulmonary disease, unspecified: Secondary | ICD-10-CM | POA: Diagnosis not present

## 2015-12-01 DIAGNOSIS — Z7951 Long term (current) use of inhaled steroids: Secondary | ICD-10-CM

## 2015-12-01 DIAGNOSIS — G8929 Other chronic pain: Secondary | ICD-10-CM

## 2015-12-01 DIAGNOSIS — Z7189 Other specified counseling: Secondary | ICD-10-CM

## 2015-12-01 DIAGNOSIS — K219 Gastro-esophageal reflux disease without esophagitis: Secondary | ICD-10-CM | POA: Diagnosis not present

## 2015-12-01 DIAGNOSIS — I2510 Atherosclerotic heart disease of native coronary artery without angina pectoris: Secondary | ICD-10-CM | POA: Diagnosis present

## 2015-12-01 DIAGNOSIS — R000 Tachycardia, unspecified: Secondary | ICD-10-CM

## 2015-12-01 DIAGNOSIS — I959 Hypotension, unspecified: Secondary | ICD-10-CM | POA: Diagnosis not present

## 2015-12-01 DIAGNOSIS — D649 Anemia, unspecified: Secondary | ICD-10-CM

## 2015-12-01 DIAGNOSIS — I252 Old myocardial infarction: Secondary | ICD-10-CM

## 2015-12-01 DIAGNOSIS — F17210 Nicotine dependence, cigarettes, uncomplicated: Secondary | ICD-10-CM

## 2015-12-01 DIAGNOSIS — J441 Chronic obstructive pulmonary disease with (acute) exacerbation: Secondary | ICD-10-CM

## 2015-12-01 DIAGNOSIS — R7989 Other specified abnormal findings of blood chemistry: Secondary | ICD-10-CM

## 2015-12-01 DIAGNOSIS — F411 Generalized anxiety disorder: Secondary | ICD-10-CM | POA: Diagnosis not present

## 2015-12-01 DIAGNOSIS — T380X5A Adverse effect of glucocorticoids and synthetic analogues, initial encounter: Secondary | ICD-10-CM | POA: Diagnosis not present

## 2015-12-01 DIAGNOSIS — M159 Polyosteoarthritis, unspecified: Secondary | ICD-10-CM | POA: Diagnosis present

## 2015-12-01 DIAGNOSIS — Z79899 Other long term (current) drug therapy: Secondary | ICD-10-CM | POA: Diagnosis not present

## 2015-12-01 DIAGNOSIS — J129 Viral pneumonia, unspecified: Secondary | ICD-10-CM

## 2015-12-01 DIAGNOSIS — I5031 Acute diastolic (congestive) heart failure: Secondary | ICD-10-CM | POA: Diagnosis present

## 2015-12-01 DIAGNOSIS — D72829 Elevated white blood cell count, unspecified: Secondary | ICD-10-CM

## 2015-12-01 DIAGNOSIS — I10 Essential (primary) hypertension: Secondary | ICD-10-CM | POA: Diagnosis not present

## 2015-12-01 DIAGNOSIS — I509 Heart failure, unspecified: Secondary | ICD-10-CM

## 2015-12-01 DIAGNOSIS — E7800 Pure hypercholesterolemia, unspecified: Secondary | ICD-10-CM | POA: Diagnosis present

## 2015-12-01 DIAGNOSIS — M150 Primary generalized (osteo)arthritis: Secondary | ICD-10-CM

## 2015-12-01 DIAGNOSIS — E876 Hypokalemia: Secondary | ICD-10-CM | POA: Insufficient documentation

## 2015-12-01 DIAGNOSIS — Z7982 Long term (current) use of aspirin: Secondary | ICD-10-CM

## 2015-12-01 DIAGNOSIS — I5033 Acute on chronic diastolic (congestive) heart failure: Secondary | ICD-10-CM | POA: Diagnosis not present

## 2015-12-01 DIAGNOSIS — Z0000 Encounter for general adult medical examination without abnormal findings: Secondary | ICD-10-CM

## 2015-12-01 DIAGNOSIS — I5030 Unspecified diastolic (congestive) heart failure: Secondary | ICD-10-CM | POA: Diagnosis present

## 2015-12-01 DIAGNOSIS — J189 Pneumonia, unspecified organism: Secondary | ICD-10-CM

## 2015-12-01 DIAGNOSIS — R5381 Other malaise: Secondary | ICD-10-CM | POA: Diagnosis present

## 2015-12-01 HISTORY — DX: Chronic pain syndrome: G89.4

## 2015-12-01 HISTORY — DX: Acute on chronic diastolic (congestive) heart failure: I50.33

## 2015-12-01 LAB — CBC WITH DIFFERENTIAL/PLATELET
BASOS ABS: 0 10*3/uL (ref 0.0–0.1)
Basophils Relative: 0 %
EOS PCT: 1 %
Eosinophils Absolute: 0.3 10*3/uL (ref 0.0–0.7)
HEMATOCRIT: 46.2 % (ref 39.0–52.0)
Hemoglobin: 14.6 g/dL (ref 13.0–17.0)
LYMPHS ABS: 3.1 10*3/uL (ref 0.7–4.0)
LYMPHS PCT: 9 %
MCH: 30.1 pg (ref 26.0–34.0)
MCHC: 31.6 g/dL (ref 30.0–36.0)
MCV: 95.3 fL (ref 78.0–100.0)
MONO ABS: 2 10*3/uL — AB (ref 0.1–1.0)
Monocytes Relative: 6 %
NEUTROS ABS: 28.6 10*3/uL — AB (ref 1.7–7.7)
NEUTROS PCT: 84 %
PLATELETS: 267 10*3/uL (ref 150–400)
RBC: 4.85 MIL/uL (ref 4.22–5.81)
RDW: 14.5 % (ref 11.5–15.5)
WBC: 34 10*3/uL — AB (ref 4.0–10.5)

## 2015-12-01 LAB — GLUCOSE, CAPILLARY
GLUCOSE-CAPILLARY: 106 mg/dL — AB (ref 65–99)
GLUCOSE-CAPILLARY: 122 mg/dL — AB (ref 65–99)
GLUCOSE-CAPILLARY: 161 mg/dL — AB (ref 65–99)
Glucose-Capillary: 101 mg/dL — ABNORMAL HIGH (ref 65–99)
Glucose-Capillary: 110 mg/dL — ABNORMAL HIGH (ref 65–99)
Glucose-Capillary: 110 mg/dL — ABNORMAL HIGH (ref 65–99)

## 2015-12-01 LAB — MAGNESIUM: Magnesium: 2.3 mg/dL (ref 1.7–2.4)

## 2015-12-01 LAB — BASIC METABOLIC PANEL
ANION GAP: 11 (ref 5–15)
BUN: 28 mg/dL — ABNORMAL HIGH (ref 6–20)
CALCIUM: 8.6 mg/dL — AB (ref 8.9–10.3)
CO2: 28 mmol/L (ref 22–32)
CREATININE: 0.71 mg/dL (ref 0.61–1.24)
Chloride: 97 mmol/L — ABNORMAL LOW (ref 101–111)
Glucose, Bld: 118 mg/dL — ABNORMAL HIGH (ref 65–99)
Potassium: 4 mmol/L (ref 3.5–5.1)
SODIUM: 136 mmol/L (ref 135–145)

## 2015-12-01 LAB — TRIGLYCERIDES: Triglycerides: 225 mg/dL — ABNORMAL HIGH (ref <150)

## 2015-12-01 LAB — PHOSPHORUS: Phosphorus: 4.9 mg/dL — ABNORMAL HIGH (ref 2.5–4.6)

## 2015-12-01 MED ORDER — PREDNISONE 10 MG PO TABS
10.0000 mg | ORAL_TABLET | Freq: Every day | ORAL | Status: DC
  Filled 2015-12-01: qty 1

## 2015-12-01 MED ORDER — CHLORHEXIDINE GLUCONATE 0.12% ORAL RINSE (MEDLINE KIT)
15.0000 mL | Freq: Two times a day (BID) | OROMUCOSAL | Status: DC
  Administered 2015-12-04 – 2015-12-07 (×6): 15 mL via OROMUCOSAL

## 2015-12-01 MED ORDER — LEVOFLOXACIN 750 MG PO TABS
750.0000 mg | ORAL_TABLET | Freq: Every day | ORAL | Status: DC
  Administered 2015-12-01: 750 mg via ORAL
  Filled 2015-12-01: qty 1

## 2015-12-01 MED ORDER — CETYLPYRIDINIUM CHLORIDE 0.05 % MT LIQD
7.0000 mL | Freq: Two times a day (BID) | OROMUCOSAL | Status: DC
  Administered 2015-12-01 – 2015-12-08 (×12): 7 mL via OROMUCOSAL

## 2015-12-01 MED ORDER — PROCHLORPERAZINE MALEATE 5 MG PO TABS: 5.0000 mg | ORAL_TABLET | Freq: Four times a day (QID) | ORAL | Status: DC | PRN

## 2015-12-01 MED ORDER — TRAZODONE HCL 50 MG PO TABS
25.0000 mg | ORAL_TABLET | Freq: Every evening | ORAL | Status: DC | PRN
  Administered 2015-12-02 – 2015-12-07 (×3): 50 mg via ORAL
  Filled 2015-12-01 (×4): qty 1

## 2015-12-01 MED ORDER — NON FORMULARY: 40.0000 mg | Freq: Every day | Status: DC

## 2015-12-01 MED ORDER — ACETAMINOPHEN 325 MG PO TABS
325.0000 mg | ORAL_TABLET | ORAL | Status: DC | PRN
  Administered 2015-12-06 – 2015-12-08 (×4): 650 mg via ORAL
  Filled 2015-12-01 (×4): qty 2

## 2015-12-01 MED ORDER — SENNOSIDES-DOCUSATE SODIUM 8.6-50 MG PO TABS: 1.0000 | ORAL_TABLET | Freq: Every evening | ORAL | Status: DC | PRN

## 2015-12-01 MED ORDER — NYSTATIN 100000 UNIT/GM EX CREA
TOPICAL_CREAM | Freq: Three times a day (TID) | CUTANEOUS | Status: DC
  Administered 2015-12-01 – 2015-12-02 (×2): via TOPICAL
  Administered 2015-12-03: 1 via TOPICAL
  Administered 2015-12-03 – 2015-12-09 (×12): via TOPICAL
  Filled 2015-12-01: qty 15

## 2015-12-01 MED ORDER — ALUM & MAG HYDROXIDE-SIMETH 200-200-20 MG/5ML PO SUSP: 30.0000 mL | ORAL | Status: DC | PRN

## 2015-12-01 MED ORDER — HEPARIN SODIUM (PORCINE) 5000 UNIT/ML IJ SOLN
5000.0000 [IU] | Freq: Three times a day (TID) | INTRAMUSCULAR | Status: DC
Start: 2015-12-01 — End: 2015-12-09
  Administered 2015-12-01 – 2015-12-09 (×24): 5000 [IU] via SUBCUTANEOUS
  Filled 2015-12-01 (×23): qty 1

## 2015-12-01 MED ORDER — IPRATROPIUM-ALBUTEROL 0.5-2.5 (3) MG/3ML IN SOLN
3.0000 mL | Freq: Three times a day (TID) | RESPIRATORY_TRACT | Status: DC
  Administered 2015-12-02 – 2015-12-06 (×12): 3 mL via RESPIRATORY_TRACT
  Filled 2015-12-01 (×14): qty 3

## 2015-12-01 MED ORDER — GUAIFENESIN-DM 100-10 MG/5ML PO SYRP: 5.0000 mL | ORAL_SOLUTION | Freq: Four times a day (QID) | ORAL | Status: DC | PRN

## 2015-12-01 MED ORDER — FLUCONAZOLE 100 MG PO TABS
100.0000 mg | ORAL_TABLET | Freq: Every day | ORAL | Status: AC
  Administered 2015-12-01 – 2015-12-03 (×3): 100 mg via ORAL
  Filled 2015-12-01 (×3): qty 1

## 2015-12-01 MED ORDER — BISACODYL 10 MG RE SUPP: 10.0000 mg | Freq: Every day | RECTAL | Status: DC | PRN

## 2015-12-01 MED ORDER — BUMETANIDE 0.25 MG/ML IJ SOLN
0.5000 mg | Freq: Two times a day (BID) | INTRAMUSCULAR | Status: DC
  Filled 2015-12-01: qty 2

## 2015-12-01 MED ORDER — ALBUTEROL SULFATE (2.5 MG/3ML) 0.083% IN NEBU: 2.5000 mg | INHALATION_SOLUTION | RESPIRATORY_TRACT | Status: DC | PRN

## 2015-12-01 MED ORDER — IPRATROPIUM-ALBUTEROL 0.5-2.5 (3) MG/3ML IN SOLN
3.0000 mL | Freq: Four times a day (QID) | RESPIRATORY_TRACT | Status: DC
  Filled 2015-12-01: qty 3

## 2015-12-01 MED ORDER — DILTIAZEM HCL 30 MG PO TABS
30.0000 mg | ORAL_TABLET | Freq: Two times a day (BID) | ORAL | Status: DC
  Administered 2015-12-01 – 2015-12-09 (×16): 30 mg via ORAL
  Filled 2015-12-01 (×16): qty 1

## 2015-12-01 MED ORDER — HYDROCODONE-ACETAMINOPHEN 5-325 MG PO TABS
2.0000 | ORAL_TABLET | Freq: Four times a day (QID) | ORAL | Status: DC | PRN
  Administered 2015-12-01: 2 via ORAL
  Filled 2015-12-01: qty 2

## 2015-12-01 MED ORDER — CETYLPYRIDINIUM CHLORIDE 0.05 % MT LIQD
7.0000 mL | Freq: Two times a day (BID) | OROMUCOSAL | Status: DC
Start: 2015-12-01 — End: 2015-12-01
  Administered 2015-12-01: 7 mL via OROMUCOSAL

## 2015-12-01 MED ORDER — FLEET ENEMA 7-19 GM/118ML RE ENEM: 1.0000 | ENEMA | Freq: Once | RECTAL | Status: DC | PRN

## 2015-12-01 MED ORDER — HYDROCODONE-ACETAMINOPHEN 5-325 MG PO TABS
2.0000 | ORAL_TABLET | Freq: Four times a day (QID) | ORAL | Status: DC | PRN
  Administered 2015-12-01 – 2015-12-09 (×26): 2 via ORAL
  Filled 2015-12-01 (×26): qty 2

## 2015-12-01 MED ORDER — BUMETANIDE 0.5 MG PO TABS
0.5000 mg | ORAL_TABLET | Freq: Two times a day (BID) | ORAL | Status: DC
  Filled 2015-12-01 (×2): qty 1

## 2015-12-01 MED ORDER — ESOMEPRAZOLE MAGNESIUM 40 MG PO CPDR
40.0000 mg | DELAYED_RELEASE_CAPSULE | ORAL | Status: DC
  Administered 2015-12-03 – 2015-12-09 (×7): 40 mg via ORAL
  Filled 2015-12-01 (×11): qty 1

## 2015-12-01 MED ORDER — LEVOFLOXACIN 500 MG PO TABS
750.0000 mg | ORAL_TABLET | Freq: Every day | ORAL | Status: AC
Start: 2015-12-02 — End: 2015-12-03
  Administered 2015-12-02 – 2015-12-03 (×2): 750 mg via ORAL
  Filled 2015-12-01 (×2): qty 2

## 2015-12-01 MED ORDER — PROCHLORPERAZINE EDISYLATE 5 MG/ML IJ SOLN: 5.0000 mg | Freq: Four times a day (QID) | INTRAMUSCULAR | Status: DC | PRN

## 2015-12-01 MED ORDER — HYDRALAZINE HCL 25 MG PO TABS
25.0000 mg | ORAL_TABLET | Freq: Three times a day (TID) | ORAL | Status: DC
  Administered 2015-12-01 – 2015-12-09 (×24): 25 mg via ORAL
  Filled 2015-12-01 (×24): qty 1

## 2015-12-01 MED ORDER — PROCHLORPERAZINE 25 MG RE SUPP: 12.5000 mg | Freq: Four times a day (QID) | RECTAL | Status: DC | PRN

## 2015-12-01 MED ORDER — PREDNISONE 5 MG PO TABS
10.0000 mg | ORAL_TABLET | Freq: Every day | ORAL | Status: AC
  Administered 2015-12-02: 10 mg via ORAL
  Filled 2015-12-01: qty 2

## 2015-12-01 MED ORDER — ACETYLCYSTEINE 20 % IN SOLN
4.0000 mL | Freq: Three times a day (TID) | RESPIRATORY_TRACT | Status: DC
  Administered 2015-12-02 – 2015-12-06 (×9): 4 mL via RESPIRATORY_TRACT
  Filled 2015-12-01 (×5): qty 4

## 2015-12-01 MED ORDER — SODIUM CHLORIDE 0.9 % IV BOLUS (SEPSIS)
500.0000 mL | Freq: Once | INTRAVENOUS | Status: AC
  Administered 2015-12-01: 500 mL via INTRAVENOUS

## 2015-12-01 NOTE — Progress Notes (Signed)
I met with pt and his wife at bedside. Both are in agreement to admit to inpt rehab today. I will make the arrangements. [phone]

## 2015-12-01 NOTE — H&P (View-Only) (Signed)
  Physical Medicine and Rehabilitation Admission H&P    Chief Complaint  Patient presents with  . Debility   HPI:  George Leon is a 59 year old smoker with history of CAD, HTN, chronic pain, COPD who was admitted on 11/25/15 with non-productive cough, fevers, BLE edema and lethargy. He was noted to be in significant respiratory distress with hypercarbia and required ventilator support for AEDOPD and suspected PNA/Viral URI. He was treated with IV antibiotics, chest PT and steroids. He was diuresed with Bumex and hydralazine due to concerns of acute on chronic CHF. He was placed on [place] drug to help manage delirium and tolerated extubation on 11/30/15.  Narcotics were held due to oversedation and [place] drugh placed on hold.  Therapy evaluations done yesterday revealing patient to be debilitated. Patient hypotensive today therefore Bumex d/c and was bolused with IVF. CIR was recommended for follow up therapy and patient cleared medically for admission today.    Review of Systems  HENT: Negative for hearing loss.   Eyes: Negative for blurred vision and double vision.  Respiratory: Positive for cough and shortness of breath.   Cardiovascular: Negative for chest pain and palpitations.  Gastrointestinal: Negative for heartburn, nausea, vomiting and constipation.  Genitourinary: Positive for frequency. Negative for urgency.  Musculoskeletal: Positive for myalgias and joint pain.  Neurological: Negative for speech change, focal weakness and headaches.  Psychiatric/Behavioral: Negative for suicidal ideas. The patient is not nervous/anxious and does not have insomnia.   All other systems reviewed and are negative.     Past Medical History  Diagnosis Date  . Hypertension   . Acute MI (HCC)   . Chronic pain   . COPD (chronic obstructive pulmonary disease) (HCC)     Past Surgical History  Procedure Laterality Date  . Joint replacement    . Back surgery    . Cervical spine surgery   1988    History reviewed. No pertinent family history.    Social History:  Married.  reports that he has been smoking Cigarettes.  He has been smoking about 1.00 pack per day. He has never used smokeless tobacco. He reports that he does not drink alcohol or use illicit drugs.    Allergies  Allergen Reactions  . Adhesive [Tape] Rash    Pulled skin off also  . Augmentin [Amoxicillin-Pot Clavulanate] Diarrhea  . Furosemide Nausea Only  . Hydrochlorothiazide Other (See Comments)    Back Pain & gives him kidney stones  . Sulfamethoxazole Swelling    Tongue Swelling    Medications Prior to Admission  Medication Sig Dispense Refill  . albuterol (PROAIR HFA) 108 (90 BASE) MCG/ACT inhaler Inhale 1-2 puffs into the lungs every 4 (four) hours as needed. As needed for shortness of breath /wheeze/cough 3 each 2  . amLODipine (NORVASC) 10 MG tablet Take 0.5 tablets (5 mg total) by mouth daily. 30 tablet 1  . aspirin EC 81 MG tablet Take 1 tablet (81 mg total) by mouth daily.    . budesonide-formoterol (SYMBICORT) 160-4.5 MCG/ACT inhaler Inhale 2 puffs into the lungs 2 (two) times daily. 1 Inhaler 12  . dextromethorphan (DELSYM) 30 MG/5ML liquid Take 30 mg by mouth as needed for cough.     . diazepam (VALIUM) 5 MG tablet Take 1 tablet (5 mg total) by mouth every 12 (twelve) hours as needed for anxiety. (Patient taking differently: Take 2.5-5 mg by mouth every 12 (twelve) hours as needed for anxiety. He takes a 0.5 tablet before he   eats per wife. It helps him relax so that he can swallow his food.) 60 tablet 2  . diltiazem (TAZTIA XT) 180 MG 24 hr capsule Take 1 capsule (180 mg total) by mouth daily. 90 capsule 3  . esomeprazole (NEXIUM) 40 MG capsule Take 1 capsule (40 mg total) by mouth daily as needed. indigestion 90 capsule 3  . fish oil-omega-3 fatty acids 1000 MG capsule Take 1 g by mouth 2 (two) times daily.    . HYDROcodone-acetaminophen (NORCO) 5-325 MG tablet Take 2 tablets by mouth every  4 (four) hours as needed for moderate pain. 240 tablet 0  . losartan (COZAAR) 50 MG tablet Take 1 tablet (50 mg total) by mouth daily. 90 tablet 3  . metoprolol (TOPROL XL) 200 MG 24 hr tablet Take 1 tablet (200 mg total) by mouth daily. 90 tablet 3  . montelukast (SINGULAIR) 4 MG chewable tablet Chew 1 tablet (4 mg total) by mouth at bedtime. 30 tablet 0  . Multiple Vitamins-Minerals (OCUVITE ADULT FORMULA) CAPS Take 1 capsule by mouth daily. 90 capsule 3  . rosuvastatin (CRESTOR) 10 MG tablet Take 1 tablet (10 mg total) by mouth daily. Substitute for Lipitor 90 tablet 3  . atorvastatin (LIPITOR) 80 MG tablet Take 1 tablet (80 mg total) by mouth daily. (Patient not taking: Reported on 11/25/2015) 90 tablet 3  . Elastic Bandages & Supports (MEDICAL COMPRESSION STOCKINGS) MISC 1 each by Does not apply route once. 2 each 0    Home: Home Living Family/patient expects to be discharged to:: Private residence Living Arrangements: Spouse/significant other, Other (Comment) (also Adam 25 yo son lives with them) Available Help at Discharge: Family, Available 24 hours/day Type of Home: Apartment Home Access: Level entry Home Layout: One level Bathroom Shower/Tub: Tub/shower unit, Curtain Bathroom Toilet: Standard Bathroom Accessibility: Yes Home Equipment: Cane - single point  Lives With: Spouse, Son   Functional History: Prior Function Level of Independence: Independent with assistive device(s) Comments: Pt using SPC PTA. Reports leg giving out at times due to weakness in knees.   Functional Status:  Mobility: Bed Mobility Overal bed mobility: Needs Assistance Bed Mobility: Rolling, Sidelying to Sit Rolling: Min assist Sidelying to sit: Mod assist, +2 for physical assistance, HOB elevated General bed mobility comments: Cues for sequening. Assist with LES, scooting bottom and to elevate trunk.  Transfers Overall transfer level: Needs assistance Equipment used: Rolling walker (2  wheeled) Transfers: Sit to/from Stand, Stand Pivot Transfers Sit to Stand: Min assist Stand pivot transfers: Mod assist General transfer comment: Min A to boost from EOB with cues for hand placement/technique. Posterior lean in standing. SPT bed to chair with Mod A for balance, pt with uncontrolled sit into chair. Sp02 dropped to high 80s on 6L/min 02. Cues for pursed lip breathing.      ADL:    Cognition: Cognition Overall Cognitive Status: Impaired/Different from baseline Orientation Level: Oriented X4 Cognition Arousal/Alertness: Awake/alert Behavior During Therapy: WFL for tasks assessed/performed Overall Cognitive Status: Impaired/Different from baseline General Comments: Seems confused. Not answering questions appropriately. "I am so confused." Able to state month with cues, year and hospital.   Blood pressure 108/62, pulse 86, temperature 98 F (36.7 C), temperature source Oral, resp. rate 20, height 5' 7.25" (1.708 m), weight 102.8 kg (226 lb 10.1 oz), SpO2 92 %. Physical Exam  Nursing note and vitals reviewed. Constitutional: He is oriented to person, place, and time. He appears well-developed and well-nourished. Nasal cannula in place.  Obese male, slumped to   there right. Has [place] with 4 liters oxygen and rectal tube in place.   HENT:  Head: Normocephalic and atraumatic.  Eyes: Conjunctivae and EOM are normal. Pupils are equal, round, and reactive to light.  Neck: Normal range of motion. Neck supple.  Cardiovascular: Regular rhythm.  Tachycardia present.   Respiratory: Effort normal and breath sounds normal. No respiratory distress. He has no wheezes.  +[place]  GI: Soft. Bowel sounds are normal.  Musculoskeletal: He exhibits edema (LUE) and tenderness (Joints).  Neurological: He is alert and oriented to person, place, and time.  Speech clear.  Follows basic commands without difficulty. Makes good eye contact with examiner.  Has poor insight/awareness of deficits 3+  reflexes b/l LE Sensation intact to light touch Motor: 4/5 proximal to distal throughout  Skin: Skin is warm and dry. No rash noted. No erythema.  Scattered abrasions  Psychiatric: Cognition and memory are impaired. He expresses inappropriate judgment.    Results for orders placed or performed during the hospital encounter of 11/25/15 (from the past 48 hour(s))  Glucose, capillary     Status: Abnormal   Collection Time: 11/29/15  3:43 PM  Result Value Ref Range   Glucose-Capillary 144 (H) 65 - 99 mg/dL  Glucose, capillary     Status: Abnormal   Collection Time: 11/29/15  8:26 PM  Result Value Ref Range   Glucose-Capillary 127 (H) 65 - 99 mg/dL  Glucose, capillary     Status: Abnormal   Collection Time: 11/30/15 12:05 AM  Result Value Ref Range   Glucose-Capillary 154 (H) 65 - 99 mg/dL  Basic metabolic panel     Status: Abnormal   Collection Time: 11/30/15  2:20 AM  Result Value Ref Range   Sodium 141 135 - 145 mmol/L   Potassium 3.5 3.5 - 5.1 mmol/L   Chloride 102 101 - 111 mmol/L   CO2 32 22 - 32 mmol/L   Glucose, Bld 138 (H) 65 - 99 mg/dL   BUN 32 (H) 6 - 20 mg/dL   Creatinine, Ser 0.62 0.61 - 1.24 mg/dL   Calcium 8.6 (L) 8.9 - 10.3 mg/dL   GFR calc non Af Amer >60 >60 mL/min   GFR calc Af Amer >60 >60 mL/min    Comment: (NOTE) The eGFR has been calculated using the CKD EPI equation. This calculation has not been validated in all clinical situations. eGFR's persistently <60 mL/min signify possible Chronic Kidney Disease.    Anion gap 7 5 - 15  CBC with Differential/Platelet     Status: Abnormal   Collection Time: 11/30/15  2:20 AM  Result Value Ref Range   WBC 21.8 (H) 4.0 - 10.5 K/uL   RBC 4.48 4.22 - 5.81 MIL/uL   Hemoglobin 13.6 13.0 - 17.0 g/dL   HCT 42.9 39.0 - 52.0 %   MCV 95.8 78.0 - 100.0 fL   MCH 30.4 26.0 - 34.0 pg   MCHC 31.7 30.0 - 36.0 g/dL   RDW 14.3 11.5 - 15.5 %   Platelets 264 150 - 400 K/uL   Neutrophils Relative % 77 %   Lymphocytes Relative  14 %   Monocytes Relative 9 %   Eosinophils Relative 0 %   Basophils Relative 0 %   Neutro Abs 16.7 (H) 1.7 - 7.7 K/uL   Lymphs Abs 3.1 0.7 - 4.0 K/uL   Monocytes Absolute 2.0 (H) 0.1 - 1.0 K/uL   Eosinophils Absolute 0.0 0.0 - 0.7 K/uL   Basophils Absolute 0.0 0.0 - 0.1   K/uL   RBC Morphology POLYCHROMASIA PRESENT    WBC Morphology ATYPICAL LYMPHOCYTES     Comment: MILD LEFT SHIFT (1-5% METAS, OCC MYELO, OCC BANDS)  Magnesium     Status: None   Collection Time: 11/30/15  2:20 AM  Result Value Ref Range   Magnesium 2.4 1.7 - 2.4 mg/dL  Phosphorus     Status: None   Collection Time: 11/30/15  2:20 AM  Result Value Ref Range   Phosphorus 4.5 2.5 - 4.6 mg/dL  Glucose, capillary     Status: Abnormal   Collection Time: 11/30/15  3:20 AM  Result Value Ref Range   Glucose-Capillary 128 (H) 65 - 99 mg/dL  Glucose, capillary     Status: Abnormal   Collection Time: 11/30/15  8:13 AM  Result Value Ref Range   Glucose-Capillary 116 (H) 65 - 99 mg/dL  Glucose, capillary     Status: Abnormal   Collection Time: 11/30/15 11:15 AM  Result Value Ref Range   Glucose-Capillary 118 (H) 65 - 99 mg/dL  Glucose, capillary     Status: Abnormal   Collection Time: 11/30/15  4:09 PM  Result Value Ref Range   Glucose-Capillary 133 (H) 65 - 99 mg/dL  Glucose, capillary     Status: Abnormal   Collection Time: 11/30/15  8:08 PM  Result Value Ref Range   Glucose-Capillary 106 (H) 65 - 99 mg/dL   Comment 1 Notify RN    Comment 2 Document in Chart   Glucose, capillary     Status: Abnormal   Collection Time: 12/01/15 12:11 AM  Result Value Ref Range   Glucose-Capillary 110 (H) 65 - 99 mg/dL  Glucose, capillary     Status: Abnormal   Collection Time: 12/01/15  3:26 AM  Result Value Ref Range   Glucose-Capillary 110 (H) 65 - 99 mg/dL  Basic metabolic panel     Status: Abnormal   Collection Time: 12/01/15  5:12 AM  Result Value Ref Range   Sodium 136 135 - 145 mmol/L   Potassium 4.0 3.5 - 5.1 mmol/L    Chloride 97 (L) 101 - 111 mmol/L   CO2 28 22 - 32 mmol/L   Glucose, Bld 118 (H) 65 - 99 mg/dL   BUN 28 (H) 6 - 20 mg/dL   Creatinine, Ser 0.71 0.61 - 1.24 mg/dL   Calcium 8.6 (L) 8.9 - 10.3 mg/dL   GFR calc non Af Amer >60 >60 mL/min   GFR calc Af Amer >60 >60 mL/min    Comment: (NOTE) The eGFR has been calculated using the CKD EPI equation. This calculation has not been validated in all clinical situations. eGFR's persistently <60 mL/min signify possible Chronic Kidney Disease.    Anion gap 11 5 - 15  CBC with Differential/Platelet     Status: Abnormal   Collection Time: 12/01/15  5:12 AM  Result Value Ref Range   WBC 34.0 (H) 4.0 - 10.5 K/uL    Comment: REPEATED TO VERIFY   RBC 4.85 4.22 - 5.81 MIL/uL   Hemoglobin 14.6 13.0 - 17.0 g/dL    Comment: REPEATED TO VERIFY   HCT 46.2 39.0 - 52.0 %   MCV 95.3 78.0 - 100.0 fL   MCH 30.1 26.0 - 34.0 pg   MCHC 31.6 30.0 - 36.0 g/dL   RDW 14.5 11.5 - 15.5 %   Platelets 267 150 - 400 K/uL    Comment: CONSISTENT WITH PREVIOUS RESULT   Neutrophils Relative % 84 %   Lymphocytes   Relative 9 %   Monocytes Relative 6 %   Eosinophils Relative 1 %   Basophils Relative 0 %   Neutro Abs 28.6 (H) 1.7 - 7.7 K/uL   Lymphs Abs 3.1 0.7 - 4.0 K/uL   Monocytes Absolute 2.0 (H) 0.1 - 1.0 K/uL   Eosinophils Absolute 0.3 0.0 - 0.7 K/uL   Basophils Absolute 0.0 0.0 - 0.1 K/uL   RBC Morphology POLYCHROMASIA PRESENT   Phosphorus     Status: Abnormal   Collection Time: 12/01/15  5:12 AM  Result Value Ref Range   Phosphorus 4.9 (H) 2.5 - 4.6 mg/dL  Magnesium     Status: None   Collection Time: 12/01/15  5:12 AM  Result Value Ref Range   Magnesium 2.3 1.7 - 2.4 mg/dL  Triglycerides     Status: Abnormal   Collection Time: 12/01/15  5:12 AM  Result Value Ref Range   Triglycerides 225 (H) <150 mg/dL  Glucose, capillary     Status: Abnormal   Collection Time: 12/01/15  8:15 AM  Result Value Ref Range   Glucose-Capillary 106 (H) 65 - 99 mg/dL  Glucose,  capillary     Status: Abnormal   Collection Time: 12/01/15 11:54 AM  Result Value Ref Range   Glucose-Capillary 122 (H) 65 - 99 mg/dL   Dg Chest Port 1 View  12/01/2015  CLINICAL DATA:  Respiratory failure, community-acquired pneumonia, coronary artery disease, current smoker. EXAM: PORTABLE CHEST 1 VIEW COMPARISON:  Portable chest x-ray of November 30, 2015 FINDINGS: The trachea and esophagus have been extubated. The lungs are adequately inflated. The interstitial markings are more conspicuous bilaterally. There is persistent subsegmental atelectasis in the left lower lung. The heart remains mildly enlarged. The pulmonary vascularity is prominent centrally. The mediastinum is normal in width. There is no pleural effusion or pneumothorax. IMPRESSION: Mild increased prominence of the pulmonary interstitium may reflect low-grade interstitial edema likely secondary to CHF. Improving subsegmental atelectasis in the left mid to lower lung. Electronically Signed   By: David  Martinique M.D.   On: 12/01/2015 07:50   Dg Chest Port 1 View  11/30/2015  CLINICAL DATA:  59 year old male with respiratory failure. Community-acquired pneumonia. Initial encounter. EXAM: PORTABLE CHEST 1 VIEW COMPARISON:  11/29/2015 and earlier. FINDINGS: Portable AP semi upright view at 0500 hours. Endotracheal tube tip is stable just below the clavicles. Enteric tube courses to the abdomen, tip not included. Partially visible ACDF hardware. Stable ventilation since 11/28/2015, with platelike left mid lung opacity most resembling atelectasis. No pneumothorax, pulmonary edema, pleural effusion, or consolidation. IMPRESSION: 1.  Stable lines and tubes. 2. Platelike left lung opacity persists, favor atelectasis. No other acute pulmonary process. Electronically Signed   By: Genevie Ann M.D.   On: 11/30/2015 07:27       Medical Problem List and Plan: 1.  Weakness confusion balance deficits secondary to debilitation after viral PNA 2.  DVT  Prophylaxis/Anticoagulation: Mechanical: Sequential compression devices, below knee Bilateral lower extremities Pharmaceutical: Lovenox 3. Chronic Pain Management: hydrocodone resumed today. Monitor for oversedation and/ or hypercarbia.  4. Mood: LCSW to follow for evaluation and support.  5. Neuropsych: This patient is not capable of making decisions on his own behalf at this time. 6. Skin/Wound Care: routine pressure relief measures. Maintain adequate nutritional and hydration status.  7. Fluids/Electrolytes/Nutrition: Monitor I/O. Check lytes in am.  8. COPD with AECOPD: Leucocytosis likely due to steroids--tapers off tomorrow.  Monitor for signs of infection. continue mucomyst and duonebs.  9. Acute  on chronic diastolic CHF:  Compensated and bumex d/c due to hypotension.  Change hydralazine to po and continue cardizem bid 10. CAP: Continue Levaquin thorough 12/25 to complete antibiotic course. 11. Pre-renal azotemia: Resolving--to continue bumex bid.  12. Delirium: resolved and MIND [place] drug on hold.  11. Hypotension:  SBP 70s today--monitor for symptoms.  12. GERD:  Intolerant of protonix. Will change to Nexium.    Post Admission Physician Evaluation: 1. Functional deficits secondary  to debilitation after viral PNA. 2. Patient is admitted to receive collaborative, interdisciplinary care between the physiatrist, rehab nursing staff, and therapy team. 3. Patient's level of medical complexity and substantial therapy needs in context of that medical necessity cannot be provided at a lesser intensity of care such as a SNF. 4. Patient has experienced substantial functional loss from his/her baseline which was documented above under the "Functional History" and "Functional Status" headings.  Judging by the patient's diagnosis, physical exam, and functional history, the patient has potential for functional progress which will result in measurable gains while on inpatient rehab.  These gains will be  of substantial and practical use upon discharge  in facilitating mobility and self-care at the household level. 5. Physiatrist will provide 24 hour management of medical needs as well as oversight of the therapy plan/treatment and provide guidance as appropriate regarding the interaction of the two. 6. 24 hour rehab nursing will assist with bladder management, bowel management, safety, skin/wound care, disease management, medication administration, pain management and patient education and help integrate therapy concepts, techniques,education, etc. 7. PT will assess and treat for/with: Lower extremity strength, range of motion, stamina, balance, functional mobility, safety, adaptive techniques and equipment, woundcare, coping skills, pain control, education.   Goals are: Supervision/Mod I. 8. OT will assess and treat for/with: ADL's, functional mobility, safety, upper extremity strength, adaptive techniques and equipment, wound mgt, ego support, and community reintegration.   Goals are: Mod I/Supervision. Therapy may proceed with showering this patient. 9. SLP will assess and treat for/with: cognition and higher level processing.  Goals are: Mod I/Supervision. 10. Case Management and Social Worker will assess and treat for psychological issues and discharge planning. 11. Team conference will be held weekly to assess progress toward goals and to determine barriers to discharge. 12. Patient will receive at least 3 hours of therapy per day at least 5 days per week. 13. ELOS: 13-16 days.       14. Prognosis:  excellent  .Delice Lesch, MD 12/01/2015

## 2015-12-01 NOTE — Progress Notes (Signed)
PMR Admission Coordinator Pre-Admission Assessment  Patient: George Leon is an 59 y.o., male MRN: [id number] DOB: [birth year] Height: 5' 7.25" (170.8 cm) Weight: 102.8 kg (226 lb 10.1 oz)  Insurance Information HMO: PPO: PCP: IPA: 80/20: OTHER:  PRIMARY: Uninsured  Development worker, community is [place] [id number]. Wife has number to follow up with financial counselor and with Medicaid worker Medicaid Application Date: Case Manager: Luther Bradley [phone] Pt states he has to meet a deductible before Medicaid is approved short term Disability Application Date: Case Worker: pt approved disability 8/16  Emergency Contact Information Contact Information    Name Relation [place] Spouse [phone]  [phone]   [place], Martignetti   [phone]   No name specified         Current Medical History  Patient Admitting Diagnosis: Debilitation after pneumonia and hypoxia  History of Present Illness: George Leon is a 59 y.o. right handed male with history of CAD maintain on aspirin, hypertension, COPD, tobacco abuse. Patient lives with spouse independent with single-point cane prior to admission. One level apartment with one-step entry. Pt lives with his wife who works during second shift and son who works first shift Presented 11/25/2015 with 3 day history of shortness of breath nonproductive cough and low-grade fever with lower extremity edema. Developed significant respiratory distress and hypoxia while in the ED placed on BiPAP and later intubated. Chest x-ray showed interstitial opacities bilaterally. Decompensated diastolic heart failure and placed on low dose diuretic. Maintain on broad-spectrum antibiotics. Follow-up chest x-ray 11/30/2015  improved. Subcutaneous heparin for DVT prophylaxis. Patient was extubated 11/30/2015. Physical therapy evaluation completed 11/30/2015 with recommendations of physical medicine rehabilitation consult.  Past Medical History  Past Medical History  Diagnosis Date  . Hypertension   . Acute MI ([place])   . Chronic pain   . COPD (chronic obstructive pulmonary disease) (HCC)     Family History  family history is not on file.  Prior Rehab/Hospitalizations:  Has the patient had major surgery during 100 days prior to admission? No  Current Medications   Current facility-administered medications:  . 0.9 % sodium chloride infusion, , Intravenous, Continuous, Marijean Heath, NP, Last Rate: 10 mL/hr at 11/30/15 0554, 10 mL/hr at 11/30/15 0554 . acetylcysteine (MUCOMYST) 20 % nebulizer / oral solution 4 mL, 4 mL, Nebulization, TID, Raylene Miyamoto, MD, 4 mL at 11/30/15 1940 . albuterol (PROVENTIL) (2.5 MG/3ML) 0.083% nebulizer solution 2.5 mg, 2.5 mg, Nebulization, Q2H PRN, Marijean Heath, NP, 2.5 mg at 11/30/15 1234 . antiseptic oral rinse (CPC / CETYLPYRIDINIUM CHLORIDE 0.05%) solution 7 mL, 7 mL, Mouth Rinse, BID, Collene Gobble, MD, 7 mL at 12/01/15 1000 . antiseptic oral rinse solution (CORINZ), 7 mL, Mouth Rinse, QID, Collene Gobble, MD, 7 mL at 12/01/15 0910 . bisacodyl (DULCOLAX) suppository 10 mg, 10 mg, Rectal, Daily PRN, [name], MD . bumetanide (BUMEX) injection 0.5 mg, 0.5 mg, Intravenous, Q12H, Raylene Miyamoto, MD . chlorhexidine gluconate (PERIDEX) 0.12 % solution 15 mL, 15 mL, Mouth Rinse, BID, [name], MD, 15 mL at 11/30/15 2000 . diltiazem (CARDIZEM) tablet 30 mg, 30 mg, Oral, Q12H, Raylene Miyamoto, MD, 30 mg at 12/01/15 0908 . heparin injection 5,000 Units, 5,000 Units, Subcutaneous, 3 times per day, Marijean Heath, NP, 5,000 Units at 12/01/15 0500 . hydrALAZINE (APRESOLINE) injection 25 mg, 25 mg, Intravenous, 3 times per  day, Raylene Miyamoto, MD, 25 mg at 12/01/15 0506 . HYDROcodone-acetaminophen (NORCO/VICODIN) 5-325 MG per tablet  2 tablet, 2 tablet, Oral, Q6H PRN, Raylene Miyamoto, MD . Influenza vac split quadrivalent PF (FLUARIX) injection 0.5 mL, 0.5 mL, Intramuscular, Prior to discharge, Collene Gobble, MD . insulin aspart (novoLOG) injection 0-20 Units, 0-20 Units, Subcutaneous, 6 times per day, [name], MD, 3 Units at 11/30/15 2047 . ipratropium-albuterol (DUONEB) 0.5-2.5 (3) MG/3ML nebulizer solution 3 mL, 3 mL, Nebulization, Q6H, Marijean Heath, NP, 3 mL at 11/30/15 1940 . levofloxacin (LEVAQUIN) tablet 750 mg, 750 mg, Oral, Daily, Jake Church Masters, [facility] . MIND-[place] study drug (PI-Feinstein), 2 mL, Intravenous, Q12H, Collene Gobble, MD, 2 mL at 11/30/15 1000 . pantoprazole sodium (PROTONIX) 40 mg/20 mL oral suspension 40 mg, 40 mg, Per Tube, Q24H, [name], MD, 40 mg at 11/30/15 1652 . pneumococcal 23 valent vaccine (PNU-IMMUNE) injection 0.5 mL, 0.5 mL, Intramuscular, Prior to discharge, Collene Gobble, MD . Derrill Memo ON 12/02/2015] predniSONE (DELTASONE) tablet 10 mg, 10 mg, Oral, Q breakfast, Raylene Miyamoto, MD . sennosides (SENOKOT) 8.8 MG/5ML syrup 5 mL, 5 mL, Per Tube, BID PRN, [name], MD  Patients Current Diet: NPO 12/01/15 at 11 am. Dr. Harland Dingwall to initiate diet this a.m.  Precautions / Restrictions Precautions Precautions: Fall Precaution Comments: monitor 02 sats; flexiseal Restrictions Weight Bearing Restrictions: No   Has the patient had 2 or more falls or a fall with injury in the past year?No  Prior Activity Level Limited Community (1-2x/wk): Pt reports he is very sedentary. 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 Watches alot of tv. (sleeps 2 am until about 430 am at night and then naps during) Community (5-7x/wk): (pt has not had license since he was 59 yo due to DWIs). Uses cane pta only. Chronic pain meds Norco 10 mg q 4 hrs pta.  Noted pt also takes valium prn pta as noted by PCP due to anxiety disorder when outside of his home.  Home Assistive Devices / Equipment Home Assistive Devices/Equipment: None Home Equipment: Cane - single point  Prior Device Use: Indicate devices/aids used by the patient prior to current illness, exacerbation or injury? cane  Prior Functional Level Prior Function Level of Independence: Independent with assistive device(s) Comments: Pt using SPC PTA. Reports leg giving out at times due to weakness in knees.   Self Care: Did the patient need help bathing, dressing, using the toilet or eating? Independent  Indoor Mobility: Did the patient need assistance with walking from room to room (with or without device)? Independent  Stairs: Did the patient need assistance with internal or external stairs (with or without device)? Independent  Functional Cognition: Did the patient need help planning regular tasks such as shopping or remembering to take medications? Independent  Current Functional Level Cognition  Overall Cognitive Status: Impaired/Different from baseline Orientation Level: Oriented X4 General Comments: Seems confused. Not answering questions appropriately. "I am so confused." Able to state month with cues, year and hospital.   Extremity Assessment (includes Sensation/Coordination)  Upper Extremity Assessment: Defer to OT evaluation, Generalized weakness  Lower Extremity Assessment: Generalized weakness    ADLs       Mobility  Overal bed mobility: Needs Assistance Bed Mobility: Rolling, Sidelying to Sit Rolling: Min assist Sidelying to sit: Mod assist, +2 for physical assistance, HOB elevated General bed mobility comments: Cues for sequening. Assist with LES, scooting bottom and to elevate trunk.     Transfers  Overall transfer level: Needs assistance Equipment used: Rolling walker (2 wheeled) Transfers: Sit to/from Stand, Stand  Pivot Transfers Sit to  Stand: Min assist Stand pivot transfers: Mod assist General transfer comment: Min A to boost from EOB with cues for hand placement/technique. Posterior lean in standing. SPT bed to chair with Mod A for balance, pt with uncontrolled sit into chair. Sp02 dropped to high 80s on 6L/min 02. Cues for pursed lip breathing.    Ambulation / Gait / Stairs / Office manager / Balance Dynamic Sitting Balance Sitting balance - Comments: Requires Min A-min guard assist sitting EOB- more assist with increased time due to fatigue. Balance Overall balance assessment: Needs assistance Sitting-balance support: Feet supported, Bilateral upper extremity supported Sitting balance-Leahy Scale: Fair Sitting balance - Comments: Requires Min A-min guard assist sitting EOB- more assist with increased time due to fatigue. Postural control: Posterior lean Standing balance support: During functional activity Standing balance-Leahy Scale: Poor Standing balance comment: Relient on RW and Min A for static/dynamic standing balance. Able to stand for peri care ~30 secs with assist.     Special needs/care consideration Oxygen 4 liters [place] Skin intact  Bowel mgmt: flexiseal due to diarhea to be removed 12/01/15 Bladder mgmt: foley Chronic pain issues and takes Norco 10 mg per pt pta [place] study discontinued once pt d/c to CIR   Previous Home Environment Living Arrangements: Spouse/significant other, Other (Comment) (also Adam 95 yo son lives with them) Lives With: Spouse, Son Available Help at Discharge: Family, Available 24 hours/day Type of Home: Apartment Home Layout: One level Home Access: Level entry Bathroom Shower/Tub: Tub/shower unit, Architectural technologist: Standard Bathroom Accessibility: Yes How Accessible: Accessible via walker Wineglass: No  Discharge Living Setting Plans for Discharge Living Setting: Lives with (comment), Apartment,  Other (Comment) (spouse and 51 yo son, Adam) Type of Home at Discharge: Apartment Discharge Home Layout: One level Discharge Home Access: Level entry Discharge Bathroom Shower/Tub: Tub/shower unit, Curtain Discharge Bathroom Toilet: Standard Discharge Bathroom Accessibility: Yes How Accessible: Accessible via walker Does the patient have any problems obtaining your medications?: Yes (Describe) (uninsured)  Social/Family/Support Systems Patient Roles: Spouse, Parent (Pt states disability approved 07/2015) Contact Information: Aretha Parrot, wife Anticipated Caregiver: wife and son, Adam Anticipated Ambulance person Information: see above Ability/Limitations of Caregiver: wife works 3 pm until 12 at [employer]; Adam works first shift. 24/7 supervision is avalable Caregiver Availability: 24/7 Discharge Plan Discussed with Primary Caregiver: Yes Is Caregiver In Agreement with Plan?: Yes Does Caregiver/Family have Issues with Lodging/Transportation while Pt is in Rehab?: No  Goals/Additional Needs Patient/Family Goal for Rehab: Supervision to min PT and OT, supervision with SLP Expected length of stay: ELOS 10-14 days Special Service Needs: Pt was in [place] study during ICU, but study discontinued once pt discharged from acute hospital to AIR Pt/Family Agrees to Admission and willing to participate: Yes Program Orientation Provided & Reviewed with Pt/Caregiver Including Roles & Responsibilities: Yes  Decrease burden of Care through IP rehab admission: n/a  Possible need for SNF placement upon discharge: not anticipated  Patient Condition: This patient's condition remains as documented in the consult dated 12/01/2015, in which the Rehabilitation Physician determined and documented that the patient's condition is appropriate for intensive rehabilitative care in an inpatient rehabilitation facility. Will admit to inpatient rehab today.  Preadmission Screen Completed By: Cleatrice Burke, 12/01/2015 11:23 AM ______________________________________________________________________  Discussed status with Dr. Posey Pronto on 12/01/2015 at 1121 and received telephone approval for admission today.  Admission Coordinator: Cleatrice Burke, time [id number] Date  12/01/2015          Cosigned by: [name], MD at 12/01/2015 11:28 AM  Revision History     Date/Time User Provider Type Action   12/01/2015 11:28 AM [name], MD Physician Cosign   12/01/2015 11:23 AM Cristina Gong, RN Rehab Admission Coordinator Sign

## 2015-12-01 NOTE — Discharge Summary (Signed)
Physician Discharge Summary  Patient ID: George Leon MRN: [id number] DOB/AGE: March 18, 1956 59 y.o.  Admit date: 11/25/2015 Discharge date: 12/01/2015  Problem List Active Problems:   Respiratory failure ([place])   Acute respiratory failure (HCC)   CAP (community acquired pneumonia)   Encounter for imaging study to confirm orogastric (OG) tube placement   Endotracheal tube present   Tobacco abuse   Coronary artery disease involving native coronary artery of native heart without angina pectoris  HPI: 59yo male smoker with hx COPD, HTN, CAD presented 12/17 with 3 day hx SOB, non-productive cough, fevers. Son has been sick with the same. Yesterday pt was sleepy and "in and out" per wife. Also c/o increased BLE edema and mild orthopnea. Denies chest pain, purulent sputum, hemoptysis, recent travel. In ER had significant respiratory distress and hypoxia, placed on bipap. PCCM called for ICU admission.  Hospital Course:  CULTURES: Flu PCR 12/17 >> negative Blood 12/17 >> NGTD resp 12/17 >> neg  ANTIBIOTICS: Rocephin 12/17>>> 12/22 Azithro 12/17>>>12/22 Levaquin 12/23>>stop date 12/25  SIGNIFICANT EVENTS: 12/19 No fever spikes 12/19 M[place] ENROLLMENT 12/21- oversedated after MIND DRUG, weaned well  LINES/TUBES: ETT 12/17 >>>12/22  DISCUSSION: 59yo male smoker with hx COPD, admitted with multifactorial acute respiratory failure r/t likely AECOPD, viral URI now with PNA +/- decompensated diastolic heart failure likely complicated by undiagnosed OSA and chronic narcs. Extubated 12/22 and he will go CIR today   ASSESSMENT / PLAN:  PULMONARY Acute hypoxemic and hypercarbic respiratory failure  Suspected CAP  AECOPD  Tobacco abuse  Suspected OSA  pulm edema component Linear atx P:  Vent support, extubated 12/22 and in no resp distress Add mucomysts, chest pt to left IV steroids taper to off bumex to neg goals remains, maintain unlikely to benefit bronch ,  has some more linear atx left base, great cough Pulmonary toilet  CARDIOVASCULAR ?Acute on chronic diastolic heart failure  Hx HTN and sys 150 Lasix allergy P:  diltiazem 30 bid Hydralazine, may need increase, seems controlled overall bumex 1 mg bid, keep on till at dry weight  RENAL Lab Results  Component Value Date   CREATININE 0.71 12/01/2015   CREATININE 0.62 11/30/2015   CREATININE 0.67 11/29/2015   CREATININE 0.59 06/30/2015   CREATININE 0.55 06/19/2015   CREATININE 0.66 05/18/2015    Recent Labs Lab 11/29/15 0310 11/30/15 0220 12/01/15 0512  K 3.6 3.5 4.0    Intake/Output Summary (Last 24 hours) at 12/01/15 1204 Last data filed at 12/01/15 1000  Gross per 24 hour  Intake    220 ml  Output   1925 ml  Net  -1705 ml      Hypokalemia Neg balance goals P:  Chem follow bumex  kvo k supp  GASTROINTESTINAL Hx GERD  P:   PPI  flexi seal needed Diet as ordered   HEMATOLOGIC Leukocytosis- on roids  Hemoconcentration P:  SQ heparin  Reducing steroids  INFECTIOUS Suspected CAP vs bronchitis viral Concern left mid lung zone Viral URI, flu negative NO VAE, NO VAP concern clinically  P:  Levaquin with stop date  ENDOCRINE CBG (last 3)   Recent Labs  12/01/15 0326 12/01/15 0815 12/01/15 1154  GLUCAP 110* 106* 122*     Hyperglycemia - no known hx DM  P:  SSI  NICE goal  NEUROLOGIC AMS - r/t hypercarbia  Hx anxiety, chronic pain delirium Improved major after mind [place] drug? P:  Holding [place] drug given oversedation noted and possible delay to extubation  affect Awake and alert off sedation   Labs at discharge Lab Results  Component Value Date   CREATININE 0.71 12/01/2015   BUN 28* 12/01/2015   NA 136 12/01/2015   K 4.0 12/01/2015   CL 97* 12/01/2015   CO2 28 12/01/2015   Lab Results  Component Value Date   WBC 34.0* 12/01/2015   HGB 14.6 12/01/2015   HCT 46.2 12/01/2015   MCV 95.3 12/01/2015    PLT 267 12/01/2015   Lab Results  Component Value Date   ALT 16* 11/28/2015   AST 13* 11/28/2015   ALKPHOS 55 11/28/2015   BILITOT 0.3 11/28/2015   Lab Results  Component Value Date   INR 0.9 05/27/2007    Current radiology studies Dg Chest Port 1 View  12/01/2015  CLINICAL DATA:  Respiratory failure, community-acquired pneumonia, coronary artery disease, current smoker. EXAM: PORTABLE CHEST 1 VIEW COMPARISON:  Portable chest x-ray of November 30, 2015 FINDINGS: The trachea and esophagus have been extubated. The lungs are adequately inflated. The interstitial markings are more conspicuous bilaterally. There is persistent subsegmental atelectasis in the left lower lung. The heart remains mildly enlarged. The pulmonary vascularity is prominent centrally. The mediastinum is normal in width. There is no pleural effusion or pneumothorax. IMPRESSION: Mild increased prominence of the pulmonary interstitium may reflect low-grade interstitial edema likely secondary to CHF. Improving subsegmental atelectasis in the left mid to lower lung. Electronically Signed   By: David  Martinique M.D.   On: 12/01/2015 07:50   Dg Chest Port 1 View  11/30/2015  CLINICAL DATA:  59 year old male with respiratory failure. Community-acquired pneumonia. Initial encounter. EXAM: PORTABLE CHEST 1 VIEW COMPARISON:  11/29/2015 and earlier. FINDINGS: Portable AP semi upright view at 0500 hours. Endotracheal tube tip is stable just below the clavicles. Enteric tube courses to the abdomen, tip not included. Partially visible ACDF hardware. Stable ventilation since 11/28/2015, with platelike left mid lung opacity most resembling atelectasis. No pneumothorax, pulmonary edema, pleural effusion, or consolidation. IMPRESSION: 1.  Stable lines and tubes. 2. Platelike left lung opacity persists, favor atelectasis. No other acute pulmonary process. Electronically Signed   By: Genevie Ann M.D.   On: 11/30/2015 07:27     Disposition: Rehabilitation     Medication List    ASK your doctor about these medications        albuterol 108 (90 BASE) MCG/ACT inhaler  Commonly known as:  PROAIR HFA  Inhale 1-2 puffs into the lungs every 4 (four) hours as needed. As needed for shortness of breath /wheeze/cough     amLODipine 10 MG tablet  Commonly known as:  NORVASC  Take 0.5 tablets (5 mg total) by mouth daily.     aspirin EC 81 MG tablet  Take 1 tablet (81 mg total) by mouth daily.     atorvastatin 80 MG tablet  Commonly known as:  LIPITOR  Take 1 tablet (80 mg total) by mouth daily.     budesonide-formoterol 160-4.5 MCG/ACT inhaler  Commonly known as:  SYMBICORT  Inhale 2 puffs into the lungs 2 (two) times daily.     dextromethorphan 30 MG/5ML liquid  Commonly known as:  DELSYM  Take 30 mg by mouth as needed for cough.     diazepam 5 MG tablet  Commonly known as:  VALIUM  Take 1 tablet (5 mg total) by mouth every 12 (twelve) hours as needed for anxiety.     diltiazem 180 MG 24 hr capsule  Commonly known as:  TAZTIA XT  Take 1 capsule (180 mg total) by mouth daily.     esomeprazole 40 MG capsule  Commonly known as:  NEXIUM  Take 1 capsule (40 mg total) by mouth daily as needed. indigestion     fish oil-omega-3 fatty acids 1000 MG capsule  Take 1 g by mouth 2 (two) times daily.     HYDROcodone-acetaminophen 5-325 MG tablet  Commonly known as:  NORCO  Take 2 tablets by mouth every 4 (four) hours as needed for moderate pain.     losartan 50 MG tablet  Commonly known as:  COZAAR  Take 1 tablet (50 mg total) by mouth daily.     Medical Compression Stockings Misc  1 each by Does not apply route once.     metoprolol 200 MG 24 hr tablet  Commonly known as:  TOPROL XL  Take 1 tablet (200 mg total) by mouth daily.     montelukast 4 MG chewable tablet  Commonly known as:  SINGULAIR  Chew 1 tablet (4 mg total) by mouth at bedtime.     OCUVITE ADULT FORMULA Caps  Take 1 capsule by  mouth daily.     rosuvastatin 10 MG tablet  Commonly known as:  CRESTOR  Take 1 tablet (10 mg total) by mouth daily. Substitute for Lipitor          Discharged Condition: fair  Time spent on discharge greater than 40 minutes.  Vital signs at Discharge. Temp:  [97.5 F (36.4 C)-98.6 F (37 C)] 98 F (36.7 C) (12/23 1154) Pulse Rate:  [85-106] 87 (12/23 1000) Resp:  [10-25] 19 (12/23 1000) BP: (82-131)/(45-84) 113/72 mmHg (12/23 1000) SpO2:  [89 %-100 %] 94 % (12/23 1000) Office follow up Special Information or instructions. He will be in rehabilitation at [facility] health.  Signed: Richardson Landry Minor ACNP Maryanna Shape PCCM Pager [phone] till 3 pm If no answer page [phone] 12/01/2015, 11:56 AM     STAFF NOTE: I, Merrie Roof, MD FACP have personally reviewed patient's available data, including medical history, events of note, physical examination and test results as part of my evaluation. I have discussed with resident/NP and other care providers such as pharmacist, RN and RRT. In addition, I personally evaluated patient and elicited key findings of: fully examined in chair, no distress, pcxr linearatx improved, WBC is hemococentration and prior steroids, he does not appear toxic and has progreswsed in every wya, we may have maxed diuresis, reduce bumex, add stop daye total 7 days abx, cam neg, to rehab, updated pt and wife  Lavon Paganini. Titus Mould, MD, [place] Pgr: [facility] 12/01/2015 3:57 PM

## 2015-12-01 NOTE — Progress Notes (Signed)
Physical Medicine and Rehabilitation Consult Reason for Consult: Debilitation after viral pneumonia/hypoxia Referring Physician: Critical care   HPI: George Leon is a 59 y.o. right handed male with history of CAD maintain on aspirin, hypertension, COPD, tobacco abuse. Patient lives with spouse independent with single-point cane prior to admission. One level home with one-step entry. Pt lives with his wife who works during the day. Presented 11/25/2015 with 3 day history of shortness of breath nonproductive cough and low-grade fever with lower extremity edema. Developed significant respiratory distress and hypoxia while in the ED placed on BiPAP and later intubated. Chest x-ray showed interstitial opacities bilaterally. Decompensated diastolic heart failure and placed on low dose diuretic. Maintain on broad-spectrum antibiotics. Follow-up chest x-ray 11/30/2015 improved. Subcutaneous heparin for DVT prophylaxis. Patient was extubated 11/30/2015. Physical therapy evaluation completed 11/30/2015 with recommendations of physical medicine rehabilitation consult.  Review of Systems  HENT: Negative for hearing loss.  Eyes: Negative for blurred vision and double vision.  Respiratory: Positive for cough and shortness of breath.  Cardiovascular: Positive for leg swelling. Negative for chest pain and palpitations.  Gastrointestinal: Positive for constipation. Negative for nausea and vomiting.  Genitourinary: Negative for dysuria and hematuria.  Musculoskeletal: Positive for myalgias and joint pain.  Skin: Negative for rash.  Neurological: Positive for weakness. Negative for seizures, loss of consciousness and headaches.  All other systems reviewed and are negative.  Past Medical History  Diagnosis Date  . Hypertension   . Acute MI ([place])   . Chronic pain   . COPD (chronic obstructive pulmonary disease) [facility])    Past Surgical History  Procedure Laterality Date  . Joint  replacement    . Back surgery    . Cervical spine surgery  1988   History reviewed. No pertinent family history. Social History:  reports that he has been smoking Cigarettes. He has been smoking about 1.00 pack per day. He has never used smokeless tobacco. He reports that he does not drink alcohol or use illicit drugs. Allergies:  Allergies  Allergen Reactions  . Adhesive [Tape] Rash    Pulled skin off also  . Augmentin [Amoxicillin-Pot Clavulanate] Diarrhea  . Furosemide Nausea Only  . Hydrochlorothiazide Other (See Comments)    Back Pain & gives him kidney stones  . Sulfamethoxazole Swelling    Tongue Swelling   Medications Prior to Admission  Medication Sig Dispense Refill  . albuterol (PROAIR HFA) 108 (90 BASE) MCG/ACT inhaler Inhale 1-2 puffs into the lungs every 4 (four) hours as needed. As needed for shortness of breath /wheeze/cough 3 each 2  . amLODipine (NORVASC) 10 MG tablet Take 0.5 tablets (5 mg total) by mouth daily. 30 tablet 1  . aspirin EC 81 MG tablet Take 1 tablet (81 mg total) by mouth daily.    . budesonide-formoterol (SYMBICORT) 160-4.5 MCG/ACT inhaler Inhale 2 puffs into the lungs 2 (two) times daily. 1 Inhaler 12  . dextromethorphan (DELSYM) 30 MG/5ML liquid Take 30 mg by mouth as needed for cough.     . diazepam (VALIUM) 5 MG tablet Take 1 tablet (5 mg total) by mouth every 12 (twelve) hours as needed for anxiety. (Patient taking differently: Take 2.5-5 mg by mouth every 12 (twelve) hours as needed for anxiety. He takes a 0.5 tablet before he eats per wife. It helps him relax so that he can swallow his food.) 60 tablet 2  . diltiazem (TAZTIA XT) 180 MG 24 hr capsule Take 1 capsule (180 mg total) by mouth daily. [place]  capsule 3  . esomeprazole (NEXIUM) 40 MG capsule Take 1 capsule (40 mg total) by mouth daily as needed. indigestion 90 capsule 3  . fish oil-omega-3 fatty acids 1000 MG  capsule Take 1 g by mouth 2 (two) times daily.    Marland Kitchen HYDROcodone-acetaminophen (NORCO) 5-325 MG tablet Take 2 tablets by mouth every 4 (four) hours as needed for moderate pain. 240 tablet 0  . losartan (COZAAR) 50 MG tablet Take 1 tablet (50 mg total) by mouth daily. 90 tablet 3  . metoprolol (TOPROL XL) 200 MG 24 hr tablet Take 1 tablet (200 mg total) by mouth daily. 90 tablet 3  . montelukast (SINGULAIR) 4 MG chewable tablet Chew 1 tablet (4 mg total) by mouth at bedtime. 30 tablet 0  . Multiple Vitamins-Minerals (OCUVITE ADULT FORMULA) CAPS Take 1 capsule by mouth daily. 90 capsule 3  . rosuvastatin (CRESTOR) 10 MG tablet Take 1 tablet (10 mg total) by mouth daily. Substitute for Lipitor 90 tablet 3  . atorvastatin (LIPITOR) 80 MG tablet Take 1 tablet (80 mg total) by mouth daily. (Patient not taking: Reported on 11/25/2015) 90 tablet 3  . Elastic Bandages & Supports (MEDICAL COMPRESSION STOCKINGS) MISC 1 each by Does not apply route once. 2 each 0    Home: Home Living Family/patient expects to be discharged to:: Private residence Living Arrangements: Spouse/significant other Available Help at Discharge: Family Type of Home: House Home Access: Level entry Home Layout: One [place]: [place] - single point  Functional History: Prior Function Level of Independence: Independent with assistive device(s) Comments: Pt using SPC PTA. Reports leg giving out at times due to weakness in knees.  Functional Status:  Mobility: Bed Mobility Overal bed mobility: Needs Assistance Bed Mobility: Rolling, Sidelying to Sit Rolling: Min assist Sidelying to sit: Mod assist, +2 for physical assistance, HOB elevated General bed mobility comments: Cues for sequening. Assist with LES, scooting bottom and to elevate trunk.  Transfers Overall transfer level: Needs assistance Equipment used: Rolling walker (2 wheeled) Transfers: Sit to/from Stand, [employer] Transfers Sit to Stand: Min assist Stand pivot transfers: Mod assist General transfer comment: Min A to boost from EOB with cues for hand placement/technique. Posterior lean in standing. SPT bed to chair with Mod A for balance, pt with uncontrolled sit into chair. Sp02 dropped to high 80s on 6L/min 02. Cues for pursed lip breathing.      ADL:    Cognition: Cognition Overall Cognitive Status: Impaired/Different from baseline Orientation Level: Oriented to person, Oriented to place, Oriented to time, Disoriented to situation Cognition Arousal/Alertness: Awake/alert Behavior During Therapy: WFL for tasks assessed/performed Overall Cognitive Status: Impaired/Different from baseline General Comments: Seems confused. Not answering questions appropriately. "I am so confused." Able to state month with cues, year and hospital.  Blood pressure 99/71, pulse 98, temperature 98 F (36.7 C), temperature source Oral, resp. rate 11, height 5' 7.25" (1.708 m), weight 102.8 kg (226 lb 10.1 oz), SpO2 92 %. Physical Exam  Vitals reviewed. Constitutional: He is oriented to person, place, and time. He appears well-developed and well-nourished.  HENT:  Head: Normocephalic and atraumatic.  Eyes: Conjunctivae and EOM are normal.  Neck: Normal range of motion. Neck supple. No thyromegaly present.  Cardiovascular: Regular rhythm.  Tachycardic  Respiratory: Effort normal and breath sounds normal.  Good inspiratory effort +[place]  GI: Soft. Bowel sounds are normal. He exhibits no distension.  Genitourinary:  +Rectal tube  Musculoskeletal: He exhibits edema (LUE) and tenderness (Joints).  Neurological:  He is alert and oriented to person, place, and time.  Makes good eye contact with examiner.  Follows simple commands.  Limited awareness of deficits 3+ reflexes b/l LE Sensation intact to light touch Motor: 4/5 proximal to distal throughout  Skin: Skin is warm and dry.  Scattered abrasions    Psychiatric: His affect is labile. His speech is tangential. Cognition and memory are impaired. He expresses inappropriate judgment. He is inattentive.     Lab Results Last 24 Hours    Results for orders placed or performed during the hospital encounter of 11/25/15 (from the past 24 hour(s))  Glucose, capillary Status: Abnormal   Collection Time: 11/29/15 3:43 PM  Result Value Ref Range   Glucose-Capillary 144 (H) 65 - 99 mg/dL  Glucose, capillary Status: Abnormal   Collection Time: 11/29/15 8:26 PM  Result Value Ref Range   Glucose-Capillary 127 (H) 65 - 99 mg/dL  Glucose, capillary Status: Abnormal   Collection Time: 11/30/15 12:05 AM  Result Value Ref Range   Glucose-Capillary 154 (H) 65 - 99 mg/dL  Basic metabolic panel Status: Abnormal   Collection Time: 11/30/15 2:20 AM  Result Value Ref Range   Sodium 141 135 - 145 mmol/L   Potassium 3.5 3.5 - 5.1 mmol/L   Chloride 102 101 - 111 mmol/L   CO2 32 22 - 32 mmol/L   Glucose, Bld 138 (H) 65 - 99 mg/dL   BUN 32 (H) 6 - 20 mg/dL   Creatinine, Ser 0.62 0.61 - 1.24 mg/dL   Calcium 8.6 (L) 8.9 - 10.3 mg/dL   GFR calc non Af Amer >60 >60 mL/min   GFR calc Af Amer >60 >60 mL/min   Anion gap 7 5 - 15  CBC with Differential/Platelet Status: Abnormal   Collection Time: 11/30/15 2:20 AM  Result Value Ref Range   WBC 21.8 (H) 4.0 - 10.5 K/uL   RBC 4.48 4.22 - 5.81 MIL/uL   Hemoglobin 13.6 13.0 - 17.0 g/dL   HCT 42.9 39.0 - 52.0 %   MCV 95.8 78.0 - 100.0 fL   MCH 30.4 26.0 - 34.0 pg   MCHC 31.7 30.0 - 36.0 g/dL   RDW 14.3 11.5 - 15.5 %   Platelets 264 150 - 400 K/uL   Neutrophils Relative % 77 %   Lymphocytes Relative 14 %   Monocytes Relative 9 %   Eosinophils Relative 0 %   Basophils Relative 0 %   Neutro Abs 16.7 (H) 1.7 - 7.7 K/uL   Lymphs Abs 3.1 0.7  - 4.0 K/uL   Monocytes Absolute 2.0 (H) 0.1 - 1.0 K/uL   Eosinophils Absolute 0.0 0.0 - 0.7 K/uL   Basophils Absolute 0.0 0.0 - 0.1 K/uL   RBC Morphology POLYCHROMASIA PRESENT    WBC Morphology ATYPICAL LYMPHOCYTES   Magnesium Status: None   Collection Time: 11/30/15 2:20 AM  Result Value Ref Range   Magnesium 2.4 1.7 - 2.4 mg/dL  Phosphorus Status: None   Collection Time: 11/30/15 2:20 AM  Result Value Ref Range   Phosphorus 4.5 2.5 - 4.6 mg/dL  Glucose, capillary Status: Abnormal   Collection Time: 11/30/15 3:20 AM  Result Value Ref Range   Glucose-Capillary 128 (H) 65 - 99 mg/dL  Glucose, capillary Status: Abnormal   Collection Time: 11/30/15 8:13 AM  Result Value Ref Range   Glucose-Capillary 116 (H) 65 - 99 mg/dL  Glucose, capillary Status: Abnormal   Collection Time: 11/30/15 11:15 AM  Result Value Ref Range   Glucose-Capillary  118 (H) 65 - 99 mg/dL      Imaging Results (Last 48 hours)    Dg Chest Port 1 View  11/30/2015 CLINICAL DATA: 59 year old male with respiratory failure. Community-acquired pneumonia. Initial encounter. EXAM: PORTABLE CHEST 1 VIEW COMPARISON: 11/29/2015 and earlier. FINDINGS: Portable AP semi upright view at 0500 hours. Endotracheal tube tip is stable just below the clavicles. Enteric tube courses to the abdomen, tip not included. Partially visible ACDF hardware. Stable ventilation since 11/28/2015, with platelike left mid lung opacity most resembling atelectasis. No pneumothorax, pulmonary edema, pleural effusion, or consolidation. IMPRESSION: 1. Stable lines and tubes. 2. Platelike left lung opacity persists, favor atelectasis. No other acute pulmonary process. Electronically Signed By: Genevie Ann M.D. On: 11/30/2015 07:27   Dg Chest Port 1 View  11/29/2015 CLINICAL DATA: Pneumonia, respiratory failure, intubated patient. EXAM: PORTABLE CHEST 1 VIEW  COMPARISON: Portable chest x-ray of November 28, 2015 FINDINGS: The lungs are adequately inflated. Confluent alveolar opacity in the left mid lung has become more conspicuous. There is no significant pleural effusion. The heart is mildly enlarged but stable. The pulmonary vascularity is not engorged. The endotracheal tube tip lies 5 cm above the carina. The esophagogastric tube tip projects below the inferior margin of the image. IMPRESSION: Slight interval worsening of the left mid lung pneumonia. Stable cardiomegaly without pulmonary vascular congestion. The support tubes are in reasonable position. Electronically Signed By: David Martinique M.D. On: 11/29/2015 07:32     Assessment/Plan: Diagnosis: Debilitation after viral pneumonia/hypoxia Labs and images independently reviewed. Records reviewed and summated above.  1. Does the need for close, 24 hr/day medical supervision in concert with the patient's rehab needs make it unreasonable for this patient to be served in a less intensive setting? Yes  2. Co-Morbidities requiring supervision/potential complications: CAD s/p MI (cont meds), COPD (cont to monitor RR and SATs in accordance with increased physical activity), tobacco abuse (cont counseling), HTN (monitor and provide prns in accordance with increased physical exertion and pain), chronic pain (Biofeedback training with therapies to help reduce reliance on opiate pain medications, monitor pain control during therapies, and sedation at rest and titrate to maximum efficacy to ensure participation and gains in therapies), leukocytosis (in large part likely secondary to steroids, cont to monitor), decompensated diastolic heart failure (Monitor in accordance with increased physical activity and avoid UE resistance excercises), suspected OSA (consider CPAP trial) 3. Due to bladder management, bowel management, safety, skin/wound care, disease management, medication administration, pain management  and patient education, does the patient require 24 hr/day rehab nursing? Yes 4. Does the patient require coordinated care of a physician, rehab nurse, PT (1-2 hrs/day, 5 days/week), OT (1-2 hrs/day, 5 days/week) and SLP (1-2 hrs/day, 5 days/week) to address physical and functional deficits in the context of the above medical diagnosis(es)? Yes Addressing deficits in the following areas: balance, endurance, locomotion, strength, transferring, bowel/bladder control, bathing, dressing, feeding, grooming, toileting, cognition, language, swallowing and psychosocial support 5. Can the patient actively participate in an intensive therapy program of at least 3 hrs of therapy per day at least 5 days per week? Potentially 6. The potential for patient to make measurable gains while on inpatient rehab is excellent 7. Anticipated functional outcomes upon discharge from inpatient rehab are supervision and min assist with PT, modified independent and supervision with OT, modified independent and supervision with SLP. 8. Estimated rehab length of stay to reach the above functional goals is: 16-19 days. 9. Does the patient have adequate social supports and living environment  to accommodate these discharge functional goals? Potentially 10. Anticipated D/C setting: Home 11. Anticipated post D/C treatments: HH therapy and Home excercise program 12. Overall Rehab/Functional Prognosis: excellent  RECOMMENDATIONS: This patient's condition is appropriate for continued rehabilitative care in the following setting: Possible CIR once acute medication issues have resolved and pt's discharge disposition is confirmed Patient has agreed to participate in recommended program. Potentially Note that insurance prior authorization may be required for reimbursement for recommended care.  Comment: Rehab Admissions Coordinator to follow up   Delice Lesch, MD 11/30/2015       Revision History     Date/Time User Provider Type  Action   11/30/2015 3:29 PM [name], MD Physician Sign   11/30/2015 2:36 PM Cathlyn Parsons, PA-C Physician Assistant Pend   View Details Report       Routing History     Date/Time From To Method   11/30/2015 3:29 PM [name], MD [name], MD In [facility]   11/30/2015 3:29 PM [name], MD Patrecia Pour, MD In Basket

## 2015-12-01 NOTE — Progress Notes (Signed)
I met with pt at bedside to discuss a possible inpt rehab admission pending his medical readiness to d/c per Dr. Harland Dingwall. Pt is in agreement to rehab admission. I contacted his wife to discuss and she states she will be here in 45 mins and I will meet her at bedside to discuss overall plans. [phone]

## 2015-12-01 NOTE — H&P (Signed)
  Physical Medicine and Rehabilitation Admission H&P    Chief Complaint  Patient presents with  . Debility   HPI:  George Leon is a 59 year old smoker with history of CAD, HTN, chronic pain, COPD who was admitted on 11/25/15 with non-productive cough, fevers, BLE edema and lethargy. He was noted to be in significant respiratory distress with hypercarbia and required ventilator support for AEDOPD and suspected PNA/Viral URI. He was treated with IV antibiotics, chest PT and steroids. He was diuresed with Bumex and hydralazine due to concerns of acute on chronic CHF. He was placed on [place] drug to help manage delirium and tolerated extubation on 11/30/15.  Narcotics were held due to oversedation and [place] drugh placed on hold.  Therapy evaluations done yesterday revealing patient to be debilitated. Patient hypotensive today therefore Bumex d/c and was bolused with IVF. CIR was recommended for follow up therapy and patient cleared medically for admission today.    Review of Systems  HENT: Negative for hearing loss.   Eyes: Negative for blurred vision and double vision.  Respiratory: Positive for cough and shortness of breath.   Cardiovascular: Negative for chest pain and palpitations.  Gastrointestinal: Negative for heartburn, nausea, vomiting and constipation.  Genitourinary: Positive for frequency. Negative for urgency.  Musculoskeletal: Positive for myalgias and joint pain.  Neurological: Negative for speech change, focal weakness and headaches.  Psychiatric/Behavioral: Negative for suicidal ideas. The patient is not nervous/anxious and does not have insomnia.   All other systems reviewed and are negative.     Past Medical History  Diagnosis Date  . Hypertension   . Acute MI (HCC)   . Chronic pain   . COPD (chronic obstructive pulmonary disease) (HCC)     Past Surgical History  Procedure Laterality Date  . Joint replacement    . Back surgery    . Cervical spine surgery   1988    History reviewed. No pertinent family history.    Social History:  Married.  reports that he has been smoking Cigarettes.  He has been smoking about 1.00 pack per day. He has never used smokeless tobacco. He reports that he does not drink alcohol or use illicit drugs.    Allergies  Allergen Reactions  . Adhesive [Tape] Rash    Pulled skin off also  . Augmentin [Amoxicillin-Pot Clavulanate] Diarrhea  . Furosemide Nausea Only  . Hydrochlorothiazide Other (See Comments)    Back Pain & gives him kidney stones  . Sulfamethoxazole Swelling    Tongue Swelling    Medications Prior to Admission  Medication Sig Dispense Refill  . albuterol (PROAIR HFA) 108 (90 BASE) MCG/ACT inhaler Inhale 1-2 puffs into the lungs every 4 (four) hours as needed. As needed for shortness of breath /wheeze/cough 3 each 2  . amLODipine (NORVASC) 10 MG tablet Take 0.5 tablets (5 mg total) by mouth daily. 30 tablet 1  . aspirin EC 81 MG tablet Take 1 tablet (81 mg total) by mouth daily.    . budesonide-formoterol (SYMBICORT) 160-4.5 MCG/ACT inhaler Inhale 2 puffs into the lungs 2 (two) times daily. 1 Inhaler 12  . dextromethorphan (DELSYM) 30 MG/5ML liquid Take 30 mg by mouth as needed for cough.     . diazepam (VALIUM) 5 MG tablet Take 1 tablet (5 mg total) by mouth every 12 (twelve) hours as needed for anxiety. (Patient taking differently: Take 2.5-5 mg by mouth every 12 (twelve) hours as needed for anxiety. He takes a 0.5 tablet before he   eats per wife. It helps him relax so that he can swallow his food.) 60 tablet 2  . diltiazem (TAZTIA XT) 180 MG 24 hr capsule Take 1 capsule (180 mg total) by mouth daily. 90 capsule 3  . esomeprazole (NEXIUM) 40 MG capsule Take 1 capsule (40 mg total) by mouth daily as needed. indigestion 90 capsule 3  . fish oil-omega-3 fatty acids 1000 MG capsule Take 1 g by mouth 2 (two) times daily.    . HYDROcodone-acetaminophen (NORCO) 5-325 MG tablet Take 2 tablets by mouth every  4 (four) hours as needed for moderate pain. 240 tablet 0  . losartan (COZAAR) 50 MG tablet Take 1 tablet (50 mg total) by mouth daily. 90 tablet 3  . metoprolol (TOPROL XL) 200 MG 24 hr tablet Take 1 tablet (200 mg total) by mouth daily. 90 tablet 3  . montelukast (SINGULAIR) 4 MG chewable tablet Chew 1 tablet (4 mg total) by mouth at bedtime. 30 tablet 0  . Multiple Vitamins-Minerals (OCUVITE ADULT FORMULA) CAPS Take 1 capsule by mouth daily. 90 capsule 3  . rosuvastatin (CRESTOR) 10 MG tablet Take 1 tablet (10 mg total) by mouth daily. Substitute for Lipitor 90 tablet 3  . atorvastatin (LIPITOR) 80 MG tablet Take 1 tablet (80 mg total) by mouth daily. (Patient not taking: Reported on 11/25/2015) 90 tablet 3  . Elastic Bandages & Supports (MEDICAL COMPRESSION STOCKINGS) MISC 1 each by Does not apply route once. 2 each 0    Home: Home Living Family/patient expects to be discharged to:: Private residence Living Arrangements: Spouse/significant other, Other (Comment) (also Adam 25 yo son lives with them) Available Help at Discharge: Family, Available 24 hours/day Type of Home: Apartment Home Access: Level entry Home Layout: One level Bathroom Shower/Tub: Tub/shower unit, Curtain Bathroom Toilet: Standard Bathroom Accessibility: Yes Home Equipment: Cane - single point  Lives With: Spouse, Son   Functional History: Prior Function Level of Independence: Independent with assistive device(s) Comments: Pt using SPC PTA. Reports leg giving out at times due to weakness in knees.   Functional Status:  Mobility: Bed Mobility Overal bed mobility: Needs Assistance Bed Mobility: Rolling, Sidelying to Sit Rolling: Min assist Sidelying to sit: Mod assist, +2 for physical assistance, HOB elevated General bed mobility comments: Cues for sequening. Assist with LES, scooting bottom and to elevate trunk.  Transfers Overall transfer level: Needs assistance Equipment used: Rolling walker (2  wheeled) Transfers: Sit to/from Stand, Stand Pivot Transfers Sit to Stand: Min assist Stand pivot transfers: Mod assist General transfer comment: Min A to boost from EOB with cues for hand placement/technique. Posterior lean in standing. SPT bed to chair with Mod A for balance, pt with uncontrolled sit into chair. Sp02 dropped to high 80s on 6L/min 02. Cues for pursed lip breathing.      ADL:    Cognition: Cognition Overall Cognitive Status: Impaired/Different from baseline Orientation Level: Oriented X4 Cognition Arousal/Alertness: Awake/alert Behavior During Therapy: WFL for tasks assessed/performed Overall Cognitive Status: Impaired/Different from baseline General Comments: Seems confused. Not answering questions appropriately. "I am so confused." Able to state month with cues, year and hospital.   Blood pressure 108/62, pulse 86, temperature 98 F (36.7 C), temperature source Oral, resp. rate 20, height 5' 7.25" (1.708 m), weight 102.8 kg (226 lb 10.1 oz), SpO2 92 %. Physical Exam  Nursing note and vitals reviewed. Constitutional: He is oriented to person, place, and time. He appears well-developed and well-nourished. Nasal cannula in place.  Obese male, slumped to   there right. Has [place] with 4 liters oxygen and rectal tube in place.   HENT:  Head: Normocephalic and atraumatic.  Eyes: Conjunctivae and EOM are normal. Pupils are equal, round, and reactive to light.  Neck: Normal range of motion. Neck supple.  Cardiovascular: Regular rhythm.  Tachycardia present.   Respiratory: Effort normal and breath sounds normal. No respiratory distress. He has no wheezes.  +  GI: Soft. Bowel sounds are normal.  Musculoskeletal: He exhibits edema (LUE) and tenderness (Joints).  Neurological: He is alert and oriented to person, place, and time.  Speech clear.  Follows basic commands without difficulty. Makes good eye contact with examiner.  Has poor insight/awareness of deficits 3+  reflexes b/l LE Sensation intact to light touch Motor: 4/5 proximal to distal throughout  Skin: Skin is warm and dry. No rash noted. No erythema.  Scattered abrasions  Psychiatric: Cognition and memory are impaired. He expresses inappropriate judgment.    Results for orders placed or performed during the hospital encounter of 11/25/15 (from the past 48 hour(s))  Glucose, capillary     Status: Abnormal   Collection Time: 11/29/15  3:43 PM  Result Value Ref Range   Glucose-Capillary 144 (H) 65 - 99 mg/dL  Glucose, capillary     Status: Abnormal   Collection Time: 11/29/15  8:26 PM  Result Value Ref Range   Glucose-Capillary 127 (H) 65 - 99 mg/dL  Glucose, capillary     Status: Abnormal   Collection Time: 11/30/15 12:05 AM  Result Value Ref Range   Glucose-Capillary 154 (H) 65 - 99 mg/dL  Basic metabolic panel     Status: Abnormal   Collection Time: 11/30/15  2:20 AM  Result Value Ref Range   Sodium 141 135 - 145 mmol/L   Potassium 3.5 3.5 - 5.1 mmol/L   Chloride 102 101 - 111 mmol/L   CO2 32 22 - 32 mmol/L   Glucose, Bld 138 (H) 65 - 99 mg/dL   BUN 32 (H) 6 - 20 mg/dL   Creatinine, Ser 0.62 0.61 - 1.24 mg/dL   Calcium 8.6 (L) 8.9 - 10.3 mg/dL   GFR calc non Af Amer >60 >60 mL/min   GFR calc Af Amer >60 >60 mL/min    Comment: (NOTE) The eGFR has been calculated using the CKD EPI equation. This calculation has not been validated in all clinical situations. eGFR's persistently <60 mL/min signify possible Chronic Kidney Disease.    Anion gap 7 5 - 15  CBC with Differential/Platelet     Status: Abnormal   Collection Time: 11/30/15  2:20 AM  Result Value Ref Range   WBC 21.8 (H) 4.0 - 10.5 K/uL   RBC 4.48 4.22 - 5.81 MIL/uL   Hemoglobin 13.6 13.0 - 17.0 g/dL   HCT 42.9 39.0 - 52.0 %   MCV 95.8 78.0 - 100.0 fL   MCH 30.4 26.0 - 34.0 pg   MCHC 31.7 30.0 - 36.0 g/dL   RDW 14.3 11.5 - 15.5 %   Platelets 264 150 - 400 K/uL   Neutrophils Relative % 77 %   Lymphocytes Relative  14 %   Monocytes Relative 9 %   Eosinophils Relative 0 %   Basophils Relative 0 %   Neutro Abs 16.7 (H) 1.7 - 7.7 K/uL   Lymphs Abs 3.1 0.7 - 4.0 K/uL   Monocytes Absolute 2.0 (H) 0.1 - 1.0 K/uL   Eosinophils Absolute 0.0 0.0 - 0.7 K/uL   Basophils Absolute 0.0 0.0 - 0.1   K/uL   RBC Morphology POLYCHROMASIA PRESENT    WBC Morphology ATYPICAL LYMPHOCYTES     Comment: MILD LEFT SHIFT (1-5% METAS, OCC MYELO, OCC BANDS)  Magnesium     Status: None   Collection Time: 11/30/15  2:20 AM  Result Value Ref Range   Magnesium 2.4 1.7 - 2.4 mg/dL  Phosphorus     Status: None   Collection Time: 11/30/15  2:20 AM  Result Value Ref Range   Phosphorus 4.5 2.5 - 4.6 mg/dL  Glucose, capillary     Status: Abnormal   Collection Time: 11/30/15  3:20 AM  Result Value Ref Range   Glucose-Capillary 128 (H) 65 - 99 mg/dL  Glucose, capillary     Status: Abnormal   Collection Time: 11/30/15  8:13 AM  Result Value Ref Range   Glucose-Capillary 116 (H) 65 - 99 mg/dL  Glucose, capillary     Status: Abnormal   Collection Time: 11/30/15 11:15 AM  Result Value Ref Range   Glucose-Capillary 118 (H) 65 - 99 mg/dL  Glucose, capillary     Status: Abnormal   Collection Time: 11/30/15  4:09 PM  Result Value Ref Range   Glucose-Capillary 133 (H) 65 - 99 mg/dL  Glucose, capillary     Status: Abnormal   Collection Time: 11/30/15  8:08 PM  Result Value Ref Range   Glucose-Capillary 106 (H) 65 - 99 mg/dL   Comment 1 Notify RN    Comment 2 Document in Chart   Glucose, capillary     Status: Abnormal   Collection Time: 12/01/15 12:11 AM  Result Value Ref Range   Glucose-Capillary 110 (H) 65 - 99 mg/dL  Glucose, capillary     Status: Abnormal   Collection Time: 12/01/15  3:26 AM  Result Value Ref Range   Glucose-Capillary 110 (H) 65 - 99 mg/dL  Basic metabolic panel     Status: Abnormal   Collection Time: 12/01/15  5:12 AM  Result Value Ref Range   Sodium 136 135 - 145 mmol/L   Potassium 4.0 3.5 - 5.1 mmol/L    Chloride 97 (L) 101 - 111 mmol/L   CO2 28 22 - 32 mmol/L   Glucose, Bld 118 (H) 65 - 99 mg/dL   BUN 28 (H) 6 - 20 mg/dL   Creatinine, Ser 0.71 0.61 - 1.24 mg/dL   Calcium 8.6 (L) 8.9 - 10.3 mg/dL   GFR calc non Af Amer >60 >60 mL/min   GFR calc Af Amer >60 >60 mL/min    Comment: (NOTE) The eGFR has been calculated using the CKD EPI equation. This calculation has not been validated in all clinical situations. eGFR's persistently <60 mL/min signify possible Chronic Kidney Disease.    Anion gap 11 5 - 15  CBC with Differential/Platelet     Status: Abnormal   Collection Time: 12/01/15  5:12 AM  Result Value Ref Range   WBC 34.0 (H) 4.0 - 10.5 K/uL    Comment: REPEATED TO VERIFY   RBC 4.85 4.22 - 5.81 MIL/uL   Hemoglobin 14.6 13.0 - 17.0 g/dL    Comment: REPEATED TO VERIFY   HCT 46.2 39.0 - 52.0 %   MCV 95.3 78.0 - 100.0 fL   MCH 30.1 26.0 - 34.0 pg   MCHC 31.6 30.0 - 36.0 g/dL   RDW 14.5 11.5 - 15.5 %   Platelets 267 150 - 400 K/uL    Comment: CONSISTENT WITH PREVIOUS RESULT   Neutrophils Relative % 84 %   Lymphocytes   Relative 9 %   Monocytes Relative 6 %   Eosinophils Relative 1 %   Basophils Relative 0 %   Neutro Abs 28.6 (H) 1.7 - 7.7 K/uL   Lymphs Abs 3.1 0.7 - 4.0 K/uL   Monocytes Absolute 2.0 (H) 0.1 - 1.0 K/uL   Eosinophils Absolute 0.3 0.0 - 0.7 K/uL   Basophils Absolute 0.0 0.0 - 0.1 K/uL   RBC Morphology POLYCHROMASIA PRESENT   Phosphorus     Status: Abnormal   Collection Time: 12/01/15  5:12 AM  Result Value Ref Range   Phosphorus 4.9 (H) 2.5 - 4.6 mg/dL  Magnesium     Status: None   Collection Time: 12/01/15  5:12 AM  Result Value Ref Range   Magnesium 2.3 1.7 - 2.4 mg/dL  Triglycerides     Status: Abnormal   Collection Time: 12/01/15  5:12 AM  Result Value Ref Range   Triglycerides 225 (H) <150 mg/dL  Glucose, capillary     Status: Abnormal   Collection Time: 12/01/15  8:15 AM  Result Value Ref Range   Glucose-Capillary 106 (H) 65 - 99 mg/dL  Glucose,  capillary     Status: Abnormal   Collection Time: 12/01/15 11:54 AM  Result Value Ref Range   Glucose-Capillary 122 (H) 65 - 99 mg/dL   Dg Chest Port 1 View  12/01/2015  CLINICAL DATA:  Respiratory failure, community-acquired pneumonia, coronary artery disease, current smoker. EXAM: PORTABLE CHEST 1 VIEW COMPARISON:  Portable chest x-ray of November 30, 2015 FINDINGS: The trachea and esophagus have been extubated. The lungs are adequately inflated. The interstitial markings are more conspicuous bilaterally. There is persistent subsegmental atelectasis in the left lower lung. The heart remains mildly enlarged. The pulmonary vascularity is prominent centrally. The mediastinum is normal in width. There is no pleural effusion or pneumothorax. IMPRESSION: Mild increased prominence of the pulmonary interstitium may reflect low-grade interstitial edema likely secondary to CHF. Improving subsegmental atelectasis in the left mid to lower lung. Electronically Signed   By: David  Martinique M.D.   On: 12/01/2015 07:50   Dg Chest Port 1 View  11/30/2015  CLINICAL DATA:  59 year old male with respiratory failure. Community-acquired pneumonia. Initial encounter. EXAM: PORTABLE CHEST 1 VIEW COMPARISON:  11/29/2015 and earlier. FINDINGS: Portable AP semi upright view at 0500 hours. Endotracheal tube tip is stable just below the clavicles. Enteric tube courses to the abdomen, tip not included. Partially visible ACDF hardware. Stable ventilation since 11/28/2015, with platelike left mid lung opacity most resembling atelectasis. No pneumothorax, pulmonary edema, pleural effusion, or consolidation. IMPRESSION: 1.  Stable lines and tubes. 2. Platelike left lung opacity persists, favor atelectasis. No other acute pulmonary process. Electronically Signed   By: Genevie Ann M.D.   On: 11/30/2015 07:27       Medical Problem List and Plan: 1.  Weakness confusion balance deficits secondary to debilitation after viral PNA 2.  DVT  Prophylaxis/Anticoagulation: Mechanical: Sequential compression devices, below knee Bilateral lower extremities Pharmaceutical: Lovenox 3. Chronic Pain Management: hydrocodone resumed today. Monitor for oversedation and/ or hypercarbia.  4. Mood: LCSW to follow for evaluation and support.  5. Neuropsych: This patient is not capable of making decisions on his own behalf at this time. 6. Skin/Wound Care: routine pressure relief measures. Maintain adequate nutritional and hydration status.  7. Fluids/Electrolytes/Nutrition: Monitor I/O. Check lytes in am.  8. COPD with AECOPD: Leucocytosis likely due to steroids--tapers off tomorrow.  Monitor for signs of infection. continue mucomyst and duonebs.  9. Acute  on chronic diastolic CHF:  Compensated and bumex d/c due to hypotension.  Change hydralazine to po and continue cardizem bid 10. CAP: Continue Levaquin thorough 12/25 to complete antibiotic course. 11. Pre-renal azotemia: Resolving--to continue bumex bid.  12. Delirium: resolved and MIND [place] drug on hold.  11. Hypotension:  SBP 70s today--monitor for symptoms.  12. GERD:  Intolerant of protonix. Will change to Nexium.    Post Admission Physician Evaluation: 1. Functional deficits secondary  to debilitation after viral PNA. 2. Patient is admitted to receive collaborative, interdisciplinary care between the physiatrist, rehab nursing staff, and therapy team. 3. Patient's level of medical complexity and substantial therapy needs in context of that medical necessity cannot be provided at a lesser intensity of care such as a SNF. 4. Patient has experienced substantial functional loss from his/her baseline which was documented above under the "Functional History" and "Functional Status" headings.  Judging by the patient's diagnosis, physical exam, and functional history, the patient has potential for functional progress which will result in measurable gains while on inpatient rehab.  These gains will be  of substantial and practical use upon discharge  in facilitating mobility and self-care at the household level. 5. Physiatrist will provide 24 hour management of medical needs as well as oversight of the therapy plan/treatment and provide guidance as appropriate regarding the interaction of the two. 6. 24 hour rehab nursing will assist with bladder management, bowel management, safety, skin/wound care, disease management, medication administration, pain management and patient education and help integrate therapy concepts, techniques,education, etc. 7. PT will assess and treat for/with: Lower extremity strength, range of motion, stamina, balance, functional mobility, safety, adaptive techniques and equipment, woundcare, coping skills, pain control, education.   Goals are: Supervision/Mod I. 8. OT will assess and treat for/with: ADL's, functional mobility, safety, upper extremity strength, adaptive techniques and equipment, wound mgt, ego support, and community reintegration.   Goals are: Mod I/Supervision. Therapy may proceed with showering this patient. 9. SLP will assess and treat for/with: cognition and higher level processing.  Goals are: Mod I/Supervision. 10. Case Management and Social Worker will assess and treat for psychological issues and discharge planning. 11. Team conference will be held weekly to assess progress toward goals and to determine barriers to discharge. 12. Patient will receive at least 3 hours of therapy per day at least 5 days per week. 13. ELOS: 13-16 days.       14. Prognosis:  excellent  .Delice Lesch, MD 12/01/2015

## 2015-12-01 NOTE — Progress Notes (Signed)
Patient wanting to hold off on CPT at this time due to patient being transferred to rehab.

## 2015-12-01 NOTE — Progress Notes (Signed)
Patient discharged to [facility].

## 2015-12-01 NOTE — PMR Pre-admission (Signed)
PMR Admission Coordinator Pre-Admission Assessment  Patient: George Leon is an 59 y.o., male MRN: [id number] DOB: 08-09-1956 Height: 5' 7.25" (170.8 cm) Weight: 102.8 kg (226 lb 10.1 oz)              Insurance Information HMO:     PPO:      PCP:      IPA:      80/20:      OTHER:  PRIMARY: Uninsured       Development worker, community is George Leon [phone]. Wife has number to follow up with financial counselor and with Medicaid worker Medicaid Application Date:       Case Manager: George Leon [id number] Pt states he has to meet a deductible before Medicaid is approved short term Disability Application Date:       Case Worker: pt approved disability 8/16  Emergency Contact Information Contact Information    Name Relation Home Work [place] Spouse [phone]  [phone]   George Leon, George Leon   [phone]   No name specified         Current Medical History  Patient Admitting Diagnosis: Debilitation after pneumonia and hypoxia  History of Present Illness: George Leon is a 59 y.o. right handed male with history of CAD maintain on aspirin, hypertension, COPD, tobacco abuse. Patient lives with spouse independent with single-point cane prior to admission. One level apartment with one-step entry. Pt lives with his wife who works during second shift and son who works first shift  Presented 11/25/2015 with 3 day history of shortness of breath nonproductive cough and low-grade fever with lower extremity edema. Developed significant respiratory distress and hypoxia while in the ED placed on BiPAP and later intubated. Chest x-ray showed interstitial opacities bilaterally. Decompensated diastolic heart failure and placed on low dose diuretic. Maintain on broad-spectrum antibiotics. Follow-up chest x-ray 11/30/2015 improved. Subcutaneous heparin for DVT prophylaxis. Patient was extubated 11/30/2015. Physical therapy evaluation completed 11/30/2015 with recommendations of physical  medicine rehabilitation consult.  Past Medical History  Past Medical History  Diagnosis Date  . Hypertension   . Acute MI ([place])   . Chronic pain   . COPD (chronic obstructive pulmonary disease) (HCC)     Family History  family history is not on file.  Prior Rehab/Hospitalizations:  Has the patient had major surgery during 100 days prior to admission? No  Current Medications   Current facility-administered medications:  .  0.9 %  sodium chloride infusion, , Intravenous, Continuous, Marijean Heath, NP, Last Rate: 10 mL/hr at 11/30/15 0554, 10 mL/hr at 11/30/15 0554 .  acetylcysteine (MUCOMYST) 20 % nebulizer / oral solution 4 mL, 4 mL, Nebulization, TID, Raylene Miyamoto, MD, 4 mL at 11/30/15 1940 .  albuterol (PROVENTIL) (2.5 MG/3ML) 0.083% nebulizer solution 2.5 mg, 2.5 mg, Nebulization, Q2H PRN, Marijean Heath, NP, 2.5 mg at 11/30/15 1234 .  antiseptic oral rinse (CPC / CETYLPYRIDINIUM CHLORIDE 0.05%) solution 7 mL, 7 mL, Mouth Rinse, BID, Collene Gobble, MD, 7 mL at 12/01/15 1000 .  antiseptic oral rinse solution (CORINZ), 7 mL, Mouth Rinse, QID, Collene Gobble, MD, 7 mL at 12/01/15 0910 .  bisacodyl (DULCOLAX) suppository 10 mg, 10 mg, Rectal, Daily PRN, [name], MD .  bumetanide (BUMEX) injection 0.5 mg, 0.5 mg, Intravenous, Q12H, Raylene Miyamoto, MD .  chlorhexidine gluconate (PERIDEX) 0.12 % solution 15 mL, 15 mL, Mouth Rinse, BID, [name], MD, 15 mL at 11/30/15 2000 .  diltiazem (CARDIZEM) tablet 30 mg,  30 mg, Oral, Q12H, Raylene Miyamoto, MD, 30 mg at 12/01/15 0908 .  heparin injection 5,000 Units, 5,000 Units, Subcutaneous, 3 times per day, Marijean Heath, NP, 5,000 Units at 12/01/15 0500 .  hydrALAZINE (APRESOLINE) injection 25 mg, 25 mg, Intravenous, 3 times per day, Raylene Miyamoto, MD, 25 mg at 12/01/15 0506 .  HYDROcodone-acetaminophen (NORCO/VICODIN) 5-325 MG per tablet 2 tablet, 2 tablet, Oral, Q6H PRN, Raylene Miyamoto, MD .   Influenza vac split quadrivalent PF (FLUARIX) injection 0.5 mL, 0.5 mL, Intramuscular, Prior to discharge, Collene Gobble, MD .  insulin aspart (novoLOG) injection 0-20 Units, 0-20 Units, Subcutaneous, 6 times per day, [name], MD, 3 Units at 11/30/15 2047 .  ipratropium-albuterol (DUONEB) 0.5-2.5 (3) MG/3ML nebulizer solution 3 mL, 3 mL, Nebulization, Q6H, Marijean Heath, NP, 3 mL at 11/30/15 1940 .  levofloxacin (LEVAQUIN) tablet 750 mg, 750 mg, Oral, Daily, Jake Church Masters, [facility] .  [place]IND[place] study drug  (PI-Feinstein), 2 mL, Intravenous, Q12H, Collene Gobble, MD, 2 mL at 11/30/15 1000 .  pantoprazole sodium (PROTONIX) 40 mg/20 mL oral suspension 40 mg, 40 mg, Per Tube, Q24H, [name], MD, 40 mg at 11/30/15 1652 .  pneumococcal 23 valent vaccine (PNU-IMMUNE) injection 0.5 mL, 0.5 mL, Intramuscular, Prior to discharge, Collene Gobble, MD .  Derrill Memo ON 12/02/2015] predniSONE (DELTASONE) tablet 10 mg, 10 mg, Oral, Q breakfast, Raylene Miyamoto, MD .  sennosides (SENOKOT) 8.8 MG/5ML syrup 5 mL, 5 mL, Per Tube, BID PRN, [name], MD  Patients Current Diet: NPO 12/01/15 at 11 am. Dr. Harland Dingwall to initiate diet this a.m.  Precautions / Restrictions Precautions Precautions: Fall Precaution Comments: monitor 02 sats; flexiseal Restrictions Weight Bearing Restrictions: No   Has the patient had 2 or more falls or a fall with injury in the past year?No  Prior Activity Level Limited Community (1-2x/wk): Pt reports he is very sedentary. 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 Watches alot of tv. (sleeps 2 am until about 430 am at night and then naps during) Community (5-7x/wk):  (pt has not had license since he was 59 yo due to DWIs). Uses cane pta only. Chronic pain meds Norco 10 mg q 4 hrs pta. Noted pt also takes valium prn pta as noted by PCP due to anxiety disorder when outside of his home.  Home Assistive Devices / Equipment Home Assistive Devices/Equipment:  None Home Equipment: Cane - single point  Prior Device Use: Indicate devices/aids used by the patient prior to current illness, exacerbation or injury? cane  Prior Functional Level Prior Function Level of Independence: Independent with assistive device(s) Comments: Pt using SPC PTA. Reports leg giving out at times due to weakness in knees.   Self Care: Did the patient need help bathing, dressing, using the toilet or eating?  Independent  Indoor Mobility: Did the patient need assistance with walking from room to room (with or without device)? Independent  Stairs: Did the patient need assistance with internal or external stairs (with or without device)? Independent  Functional Cognition: Did the patient need help planning regular tasks such as shopping or remembering to take medications? Independent  Current Functional Level Cognition  Overall Cognitive Status: Impaired/Different from baseline Orientation Level: Oriented X4 General Comments: Seems confused. Not answering questions appropriately. "I am so confused." Able to state month with cues, year and hospital.    Extremity Assessment (includes Sensation/Coordination)  Upper Extremity Assessment: Defer to OT evaluation, Generalized weakness  Lower Extremity Assessment: Generalized weakness    ADLs       Mobility  Overal bed mobility: Needs Assistance Bed Mobility: Rolling, Sidelying to Sit Rolling: Min assist Sidelying to sit: Mod assist, +2 for physical assistance, HOB elevated General bed mobility comments: Cues for sequening. Assist with LES, scooting bottom and to elevate trunk.     Transfers  Overall transfer level: Needs assistance Equipment used: Rolling walker (2 wheeled) Transfers: Sit to/from Stand, [employer] Transfers Sit to Stand: Min assist Stand pivot transfers: Mod assist General transfer comment: Min A to boost from EOB with cues for hand placement/technique. Posterior lean in standing. SPT bed to  chair with Mod A for balance, pt with uncontrolled sit into chair. Sp02 dropped to high 80s on 6L/min 02. Cues for pursed lip breathing.    Ambulation / Gait / Stairs / Office manager / Balance Dynamic Sitting Balance Sitting balance - Comments: Requires Min A-min guard assist sitting EOB- more assist with increased time due to fatigue. Balance Overall balance assessment: Needs assistance Sitting-balance support: Feet supported, Bilateral upper extremity supported Sitting balance-Leahy Scale: Fair Sitting balance - Comments: Requires Min A-min guard assist sitting EOB- more assist with increased time due to fatigue. Postural control: Posterior lean Standing balance support: During functional activity Standing balance-Leahy Scale: Poor Standing balance comment: Relient on RW and Min A for static/dynamic standing balance. Able to stand for peri care ~30 secs with assist.     Special needs/care consideration Oxygen 4 liters [place] Skin intact                          Bowel mgmt: flexiseal due to diarhea to be removed 12/01/15 Bladder mgmt: foley Chronic pain issues and takes Norco 10 mg per pt pta [place] study discontinued once pt d/c to CIR   Previous Home Environment Living Arrangements: Spouse/significant other, Other (Comment) (also Adam 19 yo son lives with them)  Lives With: Spouse, Son Available Help at Discharge: Family, Available 24 hours/day Type of Home: Apartment Home Layout: One level Home Access: Level entry Bathroom Shower/Tub: Tub/shower unit, Architectural technologist: Standard Bathroom Accessibility: Yes How Accessible: Accessible via walker [place]: No  Discharge Living Setting Plans for Discharge Living Setting: Lives with (comment), Apartment, Other (Comment) (spouse and 5 yo son, Adam) Type of Home at Discharge: Apartment Discharge Home Layout: One level Discharge Home Access: Level entry Discharge Bathroom Shower/Tub: Tub/shower  unit, Curtain Discharge Bathroom Toilet: Standard Discharge Bathroom Accessibility: Yes How Accessible: Accessible via walker Does the patient have any problems obtaining your medications?: Yes (Describe) (uninsured)  Social/Family/Support Systems Patient Roles: Spouse, Parent (Pt states disability approved 07/2015) Contact Information: Aretha Parrot, wife Anticipated Caregiver: wife and son, Adam Anticipated Ambulance person Information: see above Ability/Limitations of Caregiver: wife works 3 pm until 12 at [employer]; Adam works first shift. 24/7 supervision is avalable Caregiver Availability: 24/7 Discharge Plan Discussed with Primary Caregiver: Yes Is Caregiver In Agreement with Plan?: Yes Does Caregiver/Family have Issues with Lodging/Transportation while Pt is in Rehab?: No  Goals/Additional Needs Patient/Family Goal for Rehab: Supervision to min PT and OT, supervision with SLP Expected length of stay: ELOS 10-14 days Special Service Needs: Pt was in [place] [place] study during ICU, but study discontinued once pt discharged from acute hospital to AIR Pt/Family Agrees to Admission and willing to participate: Yes Program Orientation Provided & Reviewed with Pt/Caregiver Including Roles  &  Responsibilities: Yes  Decrease burden of Care through IP rehab admission: n/a  Possible need for SNF placement upon discharge: not anticipated  Patient Condition: This patient's condition remains as documented in the consult dated 12/01/2015, in which the Rehabilitation Physician determined and documented that the patient's condition is appropriate for intensive rehabilitative care in an inpatient rehabilitation facility. Will admit to inpatient rehab today.  Preadmission Screen Completed By:  Cleatrice Burke, 12/01/2015 11:23 AM ______________________________________________________________________   Discussed status with Dr. Posey Pronto on 12/01/2015 at 1121 and received telephone approval  for admission today.  Admission Coordinator:  Cleatrice Burke, time [id number] Date 12/01/2015

## 2015-12-01 NOTE — Interval H&P Note (Signed)
George Leon was admitted today to Inpatient Rehabilitation with the diagnosis of debilitation after viral PNA.  The patient's history has been reviewed, patient examined, and there is no change in status.  Patient continues to be appropriate for intensive inpatient rehabilitation.  I have reviewed the patient's chart and labs.  Questions were answered to the patient's satisfaction. The PAPE has been reviewed and assessment remains appropriate.  George Leon 12/01/2015, 8:07 PM

## 2015-12-02 ENCOUNTER — Inpatient Hospital Stay (HOSPITAL_COMMUNITY): Payer: Self-pay | Admitting: Physical Therapy

## 2015-12-02 ENCOUNTER — Inpatient Hospital Stay (HOSPITAL_COMMUNITY): Payer: Self-pay

## 2015-12-02 ENCOUNTER — Inpatient Hospital Stay (HOSPITAL_COMMUNITY): Payer: Medicaid Other | Admitting: Occupational Therapy

## 2015-12-02 DIAGNOSIS — I2510 Atherosclerotic heart disease of native coronary artery without angina pectoris: Secondary | ICD-10-CM

## 2015-12-02 LAB — GLUCOSE, CAPILLARY
GLUCOSE-CAPILLARY: 105 mg/dL — AB (ref 65–99)
GLUCOSE-CAPILLARY: 84 mg/dL (ref 65–99)
GLUCOSE-CAPILLARY: 92 mg/dL (ref 65–99)
Glucose-Capillary: 126 mg/dL — ABNORMAL HIGH (ref 65–99)

## 2015-12-02 LAB — CBC WITH DIFFERENTIAL/PLATELET
BASOS ABS: 0 10*3/uL (ref 0.0–0.1)
Basophils Relative: 0 %
EOS PCT: 1 %
Eosinophils Absolute: 0.2 10*3/uL (ref 0.0–0.7)
HEMATOCRIT: 44.5 % (ref 39.0–52.0)
HEMOGLOBIN: 14.5 g/dL (ref 13.0–17.0)
LYMPHS ABS: 5.1 10*3/uL — AB (ref 0.7–4.0)
LYMPHS PCT: 24 %
MCH: 30.9 pg (ref 26.0–34.0)
MCHC: 32.6 g/dL (ref 30.0–36.0)
MCV: 94.7 fL (ref 78.0–100.0)
MONOS PCT: 9 %
Monocytes Absolute: 1.9 10*3/uL — ABNORMAL HIGH (ref 0.1–1.0)
NEUTROS PCT: 66 %
Neutro Abs: 14.2 10*3/uL — ABNORMAL HIGH (ref 1.7–7.7)
Platelets: 214 10*3/uL (ref 150–400)
RBC: 4.7 MIL/uL (ref 4.22–5.81)
RDW: 14.2 % (ref 11.5–15.5)
WBC: 21.4 10*3/uL — AB (ref 4.0–10.5)

## 2015-12-02 LAB — COMPREHENSIVE METABOLIC PANEL
ALBUMIN: 2.4 g/dL — AB (ref 3.5–5.0)
ALK PHOS: 40 U/L (ref 38–126)
ALT: 19 U/L (ref 17–63)
AST: 22 U/L (ref 15–41)
Anion gap: 9 (ref 5–15)
BILIRUBIN TOTAL: 1 mg/dL (ref 0.3–1.2)
BUN: 27 mg/dL — AB (ref 6–20)
CO2: 28 mmol/L (ref 22–32)
CREATININE: 0.63 mg/dL (ref 0.61–1.24)
Calcium: 8.4 mg/dL — ABNORMAL LOW (ref 8.9–10.3)
Chloride: 101 mmol/L (ref 101–111)
GFR calc Af Amer: >60 mL/min (ref >60)
GLUCOSE: 98 mg/dL (ref 65–99)
Potassium: 3.4 mmol/L — ABNORMAL LOW (ref 3.5–5.1)
Sodium: 138 mmol/L (ref 135–145)
TOTAL PROTEIN: 5.5 g/dL — AB (ref 6.5–8.1)

## 2015-12-02 LAB — PHOSPHORUS: Phosphorus: 3.3 mg/dL (ref 2.5–4.6)

## 2015-12-02 LAB — MAGNESIUM: MAGNESIUM: 2.3 mg/dL (ref 1.7–2.4)

## 2015-12-02 NOTE — Progress Notes (Signed)
George Leon is a 59 y.o. male 01/14/1956 [id number]  Subjective: No new complaints. No new problems. Slept well. Feeling OK.  Objective: Vital signs in last 24 hours: Temp:  [97.7 F (36.5 C)-98.2 F (36.8 C)] 98.2 F (36.8 C) (12/24 0524) Pulse Rate:  [85-95] 85 (12/24 0524) Resp:  [19-20] 20 (12/24 0524) BP: (80-130)/(45-81) 116/81 mmHg (12/24 0929) SpO2:  [96 %] 96 % (12/24 0524) Weight:  [229 lb 4.5 oz (104 kg)-236 lb 15.9 oz (107.5 kg)] 229 lb 4.5 oz (104 kg) (12/24 0500) Weight change:  Last BM Date: 12/02/15 (flexiseal)  Intake/Output from previous day: 12/23 0701 - 12/24 0700 In: 300 [P.O.:300] Out: 1350 [Urine:1350] Last cbgs: CBG (last 3)   Recent Labs  12/02/15 0001 12/02/15 0823 12/02/15 1129  GLUCAP 105* 92 84     Physical Exam General: No apparent distress   HEENT: not dry Lungs: Normal effort. Lungs clear to auscultation, no crackles or wheezes. Cardiovascular: Regular rate and rhythm, no edema Abdomen: S/NT/ND; BS(+) Musculoskeletal:  unchanged Neurological: No new neurological deficits Wounds: N/A    Skin: clear  Aging changes Mental state: Alert, cooperative    Lab Results: BMET    Component Value Date/Time   NA 138 12/02/2015 0516   K 3.4* 12/02/2015 0516   CL 101 12/02/2015 0516   CO2 28 12/02/2015 0516   GLUCOSE 98 12/02/2015 0516   BUN 27* 12/02/2015 0516   CREATININE 0.63 12/02/2015 0516   CREATININE 0.59 06/30/2015 1017   CALCIUM 8.4* 12/02/2015 0516   GFRNONAA >60 12/02/2015 0516   GFRNONAA >89 05/18/2015 1209   GFRAA >60 12/02/2015 0516   GFRAA >89 05/18/2015 1209   CBC    Component Value Date/Time   WBC 21.4* 12/02/2015 0516   RBC 4.70 12/02/2015 0516   HGB 14.5 12/02/2015 0516   HCT 44.5 12/02/2015 0516   PLT 214 12/02/2015 0516   MCV 94.7 12/02/2015 0516   MCH 30.9 12/02/2015 0516   MCHC 32.6 12/02/2015 0516   RDW 14.2 12/02/2015 0516   LYMPHSABS 5.1* 12/02/2015 0516   MONOABS 1.9* 12/02/2015 0516   EOSABS 0.2 12/02/2015 0516   BASOSABS 0.0 12/02/2015 0516    Studies/Results: Dg Chest Port 1 View  12/01/2015  CLINICAL DATA:  Respiratory failure, community-acquired pneumonia, coronary artery disease, current smoker. EXAM: PORTABLE CHEST 1 VIEW COMPARISON:  Portable chest x-ray of November 30, 2015 FINDINGS: The trachea and esophagus have been extubated. The lungs are adequately inflated. The interstitial markings are more conspicuous bilaterally. There is persistent subsegmental atelectasis in the left lower lung. The heart remains mildly enlarged. The pulmonary vascularity is prominent centrally. The mediastinum is normal in width. There is no pleural effusion or pneumothorax. IMPRESSION: Mild increased prominence of the pulmonary interstitium may reflect low-grade interstitial edema likely secondary to CHF. Improving subsegmental atelectasis in the left mid to lower lung. Electronically Signed   By: David  Martinique M.D.   On: 12/01/2015 07:50    Medications: I have reviewed the patient's current medications.  Assessment/Plan:  1. Weakness confusion balance deficits secondary to debilitation after viral PNA 2. DVT Prophylaxis/Anticoagulation: Mechanical: Sequential compression devices, below knee Bilateral lower extremities Pharmaceutical: Lovenox 3. Chronic Pain Management: hydrocodone resumed today. Monitor for oversedation and/ or hypercarbia.  4. Mood: LCSW to follow for evaluation and support.  5. Neuropsych: This patient is not capable of making decisions on his own behalf at this time. 6. Skin/Wound Care: routine pressure relief measures. Maintain adequate nutritional and hydration status.  7. Fluids/Electrolytes/Nutrition: Monitor I/O. Check lytes in am.  8. COPD with AECOPD: Leucocytosis likely due to steroids--tapers off tomorrow. Monitor for signs of infection. continue mucomyst and duonebs.  9. Acute on chronic diastolic CHF: Compensated and bumex d/c due to  hypotension. Change hydralazine to po and continue cardizem bid 10. CAP: Continue Levaquin thorough 12/25 to complete antibiotic course. 11. Pre-renal azotemia: Resolving--to continue bumex bid.  12. Delirium: resolved and [place] drug on hold.  11. Hypotension: SBP 70s today--monitor for symptoms.  12. GERD: Intolerant of protonix. Will change to Nexium.     Length of stay, days: 1  Walker Kehr , MD 12/02/2015, 1:50 PM

## 2015-12-02 NOTE — Evaluation (Signed)
Speech Language Pathology Assessment and Plan  Patient Details  Name: George Leon MRN: [id number] Date of Birth: September 20, 1956  SLP Diagnosis: Cognitive Impairments  Rehab Potential: Good ELOS:      Today's Date: 12/02/2015 SLP Individual Time: 5400-8676 SLP Individual Time Calculation (min): 15 min   Problem List:  Patient Active Problem List   Diagnosis Date Noted  . Physical debility 12/01/2015  . Chronic pain syndrome 12/01/2015  . Acute on chronic diastolic heart failure ([place]) 12/01/2015  . Prerenal azotemia 12/01/2015  . Labile blood pressure 12/01/2015  . Tobacco abuse   . Coronary artery disease involving native coronary artery of native heart without angina pectoris   . Endotracheal tube present   . Acute respiratory failure ([place])   . CAP (community acquired pneumonia)   . Encounter for imaging study to confirm orogastric (OG) tube placement   . Respiratory failure ([place]) 11/25/2015  . Acute respiratory failure with hypercapnia ([place])   . Olecranon bursitis of right elbow 09/21/2015  . Chronic venous insufficiency 06/19/2015  . Diastolic CHF ([place]) 19/50/9326  . Proteinuria 05/22/2015  . Macular degeneration, 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  . Bilateral lower extremity edema 05/11/2015  . Issue of medical certificate for disability examination 04/21/2015  . Osteoarthritis of left knee 12/28/2014  . Osteoarthritis of right knee 12/28/2014  . Peripheral vascular disease ([place]) 08/23/2013  . CAD (coronary artery disease) 08/23/2013  . Sciatica of left side 03/30/2013  . Disorder of SI (sacroiliac) joint 03/30/2013  . GANGLION CYST, WRIST, LEFT 11/16/2010  . COPD (chronic obstructive pulmonary disease) ([place]) 10/26/2010  . Anxiety state 05/24/2009  . Osteoarthritis of multiple joints 08/25/2008  . OBESITY, MORBID 05/26/2008  . GERD 06/19/2007  . TOBACCO ABUSE 04/07/2007  . Encounter for chronic pain management 04/07/2007  . HYPERCHOLESTEROLEMIA 02/16/2007  . HYPERTENSION,  BENIGN 02/16/2007   Past Medical History:  Past Medical History  Diagnosis Date  . Hypertension   . Acute MI ([place])   . Chronic pain   . COPD (chronic obstructive pulmonary disease) ([place])   . Chronic pain syndrome 12/01/2015  . Acute on chronic diastolic heart failure ([place]) 12/01/2015   Past Surgical History:  Past Surgical History  Procedure Laterality Date  . Joint replacement    . Back surgery    . Cervical spine surgery  1988    Assessment / Plan / Recommendation Clinical Impression 59 year old with PMH: tobacco abuse, CAD, HTN, chronic pain, COPD admitted on 11/25/15 with non-productive cough, fevers, BLE edema and lethargy and noted to be in significant respiratory distress with hypercarbia due to PNA/Viral URI.  Intubated 12/17-12/22. Per MD ntoes, narcotics due to oversedation. No Speech Pathology intervention while in acute care. Pt admitted to CIR due to significantl debilitation following viral PNA. Bedside swallow assessment completed. Oral and pharyngeal phases of swallow within functional limits. Pt exhibited eficient mastication and swift oral transit. No indications of aspiration or pharyngeal residue and clearance of oral cavity. Recommend pt continue regular texture, thin liquids and pills with thin. No need for further intervention for swallow. Cognitively, pt scored within averange range on all subtests of the Cognistat Examination, however more informal observations and interactions revealed mild deficits with safety awareness, higher level problem solving and working memory. Pt appears to have a humerous/gregarious personality that may mask subtle cognitive needs. Pt may require only short term ST, max 3 times a week to address goals while in rehab.  Skilled Therapeutic Interventions  SLP Assessment  Patient will need skilled Speech Lanaguage Pathology Services during CIR admission    Recommendations  SLP Diet Recommendations: Thin;Age appropriate regular  solids Liquid Administration via: Cup;Straw Medication Administration: Whole meds with liquid Supervision: Patient able to self feed Postural Changes and/or Swallow Maneuvers: Seated upright 90 degrees Oral Care Recommendations: Oral care BID Patient destination: Home Follow up Recommendations: None Equipment Recommended: None recommended by SLP    SLP Frequency 1 to 3 out of 7 days   SLP Treatment/Interventions Cognitive remediation/compensation;Functional tasks;Patient/family education;Speech/Language facilitation   Pain Pain Assessment Pain Assessment: 0-10 Pain Score: 9  Pain Type: Chronic pain Pain Location: Back Pain Intervention(s): Medication (See eMAR) Prior Functioning Cognitive/Linguistic Baseline: Within functional limits Type of Home: Apartment  Lives With: Spouse;Son Education: some college Vocation: Other (comment)  Function:  Eating Eating                 Cognition Comprehension    Expression   Expression assist level: Expresses complex ideas: With extra time/assistive device  Social Interaction Social Interaction assist level: Interacts appropriately with others with medication or extra time (anti-anxiety, antidepressant).  Problem Solving Problem solving assist level: Solves basic 75 - 89% of the time/requires cueing 10 - 24% of the time  Memory Memory assist level: Recognizes or recalls 75 - 89% of the time/requires cueing 10 - 24% of the time   Short Term Goals: Week 1: SLP Short Term Goal 1 (Week 1): Pt will utilize memoety aids to facilitate recall of complex information for increased safety with min verbal cues SLP Short Term Goal 2 (Week 1): Pt willdemonstrate complex probling solving abilities during functional ADL with modified independence  Refer to Care Plan for Long Term Goals  Recommendations for other services: None  Discharge Criteria: Patient will be discharged from SLP if patient refuses treatment 3 consecutive times without  medical reason, if treatment goals not met, if there is a change in medical status, if patient makes no progress towards goals or if patient is discharged from hospital.  The above assessment, treatment plan, treatment alternatives and goals were discussed and mutually agreed upon: by patient  Houston Siren 12/02/2015, 12:16 PM

## 2015-12-02 NOTE — Evaluation (Addendum)
Physical Therapy Assessment and Plan  Patient Details  Name: George Leon MRN: [id number] Date of Birth: June 06, 1956  PT Diagnosis: Abnormality of gait, Difficulty walking and Muscle weakness Rehab Potential: Good ELOS: 10-12 days   Today's Date: 12/02/2015 PT Individual Time: 1300-1430 PT Individual Time Calculation (min): 90 min    Problem List:  Patient Active Problem List   Diagnosis Date Noted  . Physical debility 12/01/2015  . Chronic pain syndrome 12/01/2015  . Acute on chronic diastolic heart failure ([place]) 12/01/2015  . Prerenal azotemia 12/01/2015  . Labile blood pressure 12/01/2015  . Tobacco abuse   . Coronary artery disease involving native coronary artery of native heart without angina pectoris   . Endotracheal tube present   . Acute respiratory failure ([place])   . CAP (community acquired pneumonia)   . Encounter for imaging study to confirm orogastric (OG) tube placement   . Respiratory failure ([place]) 11/25/2015  . Acute respiratory failure with hypercapnia ([place])   . Olecranon bursitis of right elbow 09/21/2015  . Chronic venous insufficiency 06/19/2015  . Diastolic CHF ([place]) 28/76/8115  . Proteinuria 05/22/2015  . Macular degeneration, 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  . Bilateral lower extremity edema 05/11/2015  . Issue of medical certificate for disability examination 04/21/2015  . Osteoarthritis of left knee 12/28/2014  . Osteoarthritis of right knee 12/28/2014  . Peripheral vascular disease ([place]) 08/23/2013  . CAD (coronary artery disease) 08/23/2013  . Sciatica of left side 03/30/2013  . Disorder of SI (sacroiliac) joint 03/30/2013  . GANGLION CYST, WRIST, LEFT 11/16/2010  . COPD (chronic obstructive pulmonary disease) ([place]) 10/26/2010  . Anxiety state 05/24/2009  . Osteoarthritis of multiple joints 08/25/2008  . OBESITY, MORBID 05/26/2008  . GERD 06/19/2007  . TOBACCO ABUSE 04/07/2007  . Encounter for chronic pain management 04/07/2007  .  HYPERCHOLESTEROLEMIA 02/16/2007  . HYPERTENSION, BENIGN 02/16/2007    Past Medical History:  Past Medical History  Diagnosis Date  . Hypertension   . Acute MI ([place])   . Chronic pain   . COPD (chronic obstructive pulmonary disease) ([place])   . Chronic pain syndrome 12/01/2015  . Acute on chronic diastolic heart failure ([place]) 12/01/2015   Past Surgical History:  Past Surgical History  Procedure Laterality Date  . Joint replacement    . Back surgery    . Cervical spine surgery  1988    Assessment & Plan Clinical Impression: George Leon is a 59 y.o. right handed male with history of CAD maintain on aspirin, hypertension, COPD, tobacco abuse. Patient lives with spouse independent with single-point cane prior to admission. One level apartment with one-step entry. Pt lives with his wife who works during second shift and son who works first shift Presented 11/25/2015 with 3 day history of shortness of breath nonproductive cough and low-grade fever with lower extremity edema. Developed significant respiratory distress and hypoxia while in the ED placed on BiPAP and later intubated. Chest x-ray showed interstitial opacities bilaterally. Decompensated diastolic heart failure and placed on low dose diuretic. Maintain on broad-spectrum antibiotics. Follow-up chest x-ray 11/30/2015 improved. Subcutaneous heparin for DVT prophylaxis. Patient was extubated 11/30/2015. Physical therapy evaluation completed 11/30/2015 with recommendations of physical medicine rehabilitation consult. Patient transferred to CIR on 12/01/2015 .   Patient currently requires mod with mobility secondary to muscle weakness, decreased cardiorespiratoy endurance and decreased oxygen support, impaired timing and sequencing and decreased motor planning and decreased sitting balance, decreased standing balance, decreased postural control and decreased balance strategies.  Prior to hospitalization, patient was  modified independent   with mobility and lived with Spouse, Son in a [place] home.  Home access is 1 4-inch stepStairs to enter.  Patient will benefit from skilled PT intervention to maximize safe functional mobility, minimize fall risk and decrease caregiver burden for planned discharge home with 24 hour supervision.  Anticipate patient will benefit from follow up [place] at discharge.  PT - End of Session Activity Tolerance: Tolerates 30+ min activity with multiple rests Endurance Deficit: Yes Endurance Deficit Description: several rest breaks required due to fatigue, shortness of breath PT Assessment Rehab Potential (ACUTE/IP ONLY): Good PT Patient demonstrates impairments in the following area(s): Balance;Perception;Safety;Endurance;Motor;Sensory PT Transfers Functional Problem(s): Bed Mobility;Bed to Chair;Car;Furniture PT Locomotion Functional Problem(s): Ambulation;Wheelchair Mobility;Stairs PT Plan PT Intensity: Minimum of 1-2 x/day ,45 to 90 minutes PT Frequency: 5 out of 7 days PT Duration Estimated Length of Stay: 12-14 days PT Treatment/Interventions: Ambulation/gait training;Balance/vestibular training;Community reintegration;Cognitive remediation/compensation;Discharge planning;DME/adaptive equipment instruction;Functional mobility training;Pain management;Patient/family education;Neuromuscular re-education;Functional electrical stimulation;Psychosocial support;Splinting/orthotics;Stair training;Therapeutic Exercise;Therapeutic Activities;UE/LE Strength taining/ROM;UE/LE Coordination activities;Visual/perceptual remediation/compensation;Wheelchair propulsion/positioning PT Transfers Anticipated Outcome(s): mod I PT Locomotion Anticipated Outcome(s): Supervision with LRAD PT Recommendation Follow Up Recommendations: Home health PT Patient destination: Home Equipment Recommended: To be determined Equipment Details: has [facility]  Skilled Therapeutic Intervention Pt received seated in w/c, c/o pain as below  and agreeable to treatment. Initial evaluation performed and completed. Performed upper body dressing with setup A, lower body dressing with minA, and steadyA for standing balance. Assessed all mobility as below with min/modA. Pt discussed chronic pain in LE's, back, and states he has been on heavy medications for a long time and was preparing to increase to a higher dose. However, during mobility pt does not express increase in pain or discomfort. Pt educated in goals of rehab, estimated length of stay, goals set at [facility] I/S, falls safety, and use of call bell system for safety before getting up from bed or chair. Pt remained seated in w/c at completion of session, all needs in reach.   PT Evaluation Precautions/Restrictions Precautions Precautions: Fall Precaution Comments: monitor O2 sats Restrictions Weight Bearing Restrictions: No General Chart Reviewed: Yes Response to Previous Treatment: Patient reporting fatigue but able to participate. Family/Caregiver Present: No  Pain Pain Assessment Pain Assessment: 0-10 Pain Score: 9  Pain Type: Chronic pain Pain Location: Knee Pain Orientation: Right Pain Descriptors / Indicators: Aching Pain Onset: On-going Patients Stated Pain Goal: 3 Pain Intervention(s): Other (Comment) (pre-medicated) Multiple Pain Sites: Yes 2nd Pain Site Pain Score: 9 Pain Type: Chronic pain Pain Location: Back Pain Orientation: Mid Pain Descriptors / Indicators: Shooting Pain Onset: On-going Patient's Stated Pain Goal: 3 Pain Intervention(s): Other (Comment) (pre-medicated) Home Living/Prior Functioning Home Living Available Help at Discharge: Family;Available 24 hours/day Type of Home: Apartment Home Access: Stairs to enter Entrance Stairs-Number of Steps: 1 4-inch step Home Layout: One level Bathroom Shower/Tub: Product/process development scientist: Standard Bathroom Accessibility: Yes  Lives With: Spouse;Son Prior Function Level of  Independence: Independent with transfers;Independent with basic ADLs;Independent with gait (used SPC)  Able to Take Stairs?: Yes Vocation: On disability Comments: Enjoys cooking, doing Medical sales representative, gets mail from [employer] Vision/Perception    Pt reports no change from baseline Cognition Overall Cognitive Status: Impaired/Different from baseline Arousal/Alertness: Awake/alert Orientation Level: Oriented X4 Attention: Sustained Sustained Attention: Appears intact Memory: Impaired Memory Impairment: Other (comment);Retrieval deficit;Decreased recall of new information Awareness: Impaired Awareness Impairment: Anticipatory impairment Problem Solving: Impaired Problem Solving Impairment: Verbal complex Safety/Judgment: Appears intact Sensation Sensation Light Touch: Appears Intact Proprioception:  Impaired Detail Proprioception Impaired Details: Impaired LLE (Accurate 8/10 L hallux, 10/10 RLE) Motor  Motor Motor - Skilled Clinical Observations: generalized weakness, decreased initiation for all mobility tasks, poor power production  Mobility Bed Mobility Bed Mobility: Rolling Right;Rolling Left;Supine to Sit;Sit to Supine Rolling Right: 5: Supervision Rolling Right Details: Verbal cues for sequencing;Verbal cues for technique Rolling Left: 5: Supervision Rolling Left Details: Verbal cues for sequencing;Verbal cues for technique Supine to Sit: 5: Supervision Supine to Sit Details: Verbal cues for technique;Verbal cues for precautions/safety;Verbal cues for sequencing Sit to Supine: 5: Supervision Sit to Supine - Details: Verbal cues for sequencing;Verbal cues for technique;Verbal cues for precautions/safety Transfers Transfers: Yes Stand Pivot Transfers: 4: Min assist Stand Pivot Transfer Details: Verbal cues for sequencing;Verbal cues for technique;Tactile cues for posture Locomotion  Ambulation Ambulation: Yes Ambulation/Gait Assistance: 4: Min assist Ambulation Distance (Feet):  100 Feet Assistive device: Rolling walker Ambulation/Gait Assistance Details: Verbal cues for gait pattern;Verbal cues for precautions/safety;Verbal cues for technique;Tactile cues for posture Gait Gait: Yes Gait Pattern: Impaired Gait Pattern: Decreased stride length;Shuffle;Left flexed knee in stance;Right flexed knee in stance;Poor foot clearance - left;Poor foot clearance - right;Trunk flexed Gait velocity: decreased for age/gender norms Stairs / Additional Locomotion Stairs: Yes Stairs Assistance: 3: Mod assist Stairs Assistance Details: Tactile cues for posture;Verbal cues for sequencing;Verbal cues for technique;Verbal cues for precautions/safety;Verbal cues for gait pattern Stair Management Technique: Two rails;Alternating pattern Number of Stairs: 8 Height of Stairs: 3 Wheelchair Mobility Wheelchair Mobility: Yes Wheelchair Assistance: 4: Audiological scientist Details: Verbal cues for sequencing;Verbal cues for technique;Verbal cues for Information systems manager: Both upper extremities Wheelchair Parts Management: Needs assistance Distance: 100  Trunk/Postural Assessment  Cervical Assessment Cervical Assessment: Within Functional Limits Thoracic Assessment Thoracic Assessment: Exceptions to [facility] (increased thoracic kyphosis) Lumbar Assessment Lumbar Assessment: Exceptions to [facility] (reduced lumbar lordosis, posterior pelvic tilt) Postural Control Postural Control: Deficits on evaluation (decreased righting reactions, stepping strategies for sitting and standing balance)  Balance Balance Balance Assessed: Yes Static Sitting Balance Static Sitting - Balance Support: Feet supported;Bilateral upper extremity supported Static Sitting - Level of Assistance: 5: Stand by assistance Dynamic Sitting Balance Dynamic Sitting - Balance Support: No upper extremity supported;Feet supported Dynamic Sitting - Level of Assistance: 5: Stand by assistance Dynamic  Sitting - Balance Activities: Reaching for objects;Reaching across midline;Reaching for weighted objects Static Standing Balance Static Standing - Balance Support: Bilateral upper extremity supported;During functional activity Static Standing - Level of Assistance: 4: Min assist Dynamic Standing Balance Dynamic Standing - Balance Support: Bilateral upper extremity supported;During functional activity Dynamic Standing - Level of Assistance: 3: Mod assist Dynamic Standing - Balance Activities: Reaching for weighted objects;Reaching across midline;Reaching for objects Extremity Assessment  RUE Assessment RUE Assessment: Within Functional Limits (grossly 4+/5 throughout) LUE Assessment LUE Assessment: Within Functional Limits (grossly 4+/5 throughout) RLE Assessment RLE Assessment: Exceptions to [facility] (hip flexion 4-/5, knee extension/flexion 4+/5, ankle DF/PR 4+/5) LLE Assessment LLE Assessment: Exceptions to [facility] (hip flexion 4/5, knee extension/flexion 4+/5, ankle DF/PF 4+/5)   See Function Navigator for Current Functional Status.   Refer to Care Plan for Long Term Goals  Recommendations for other services: None  Discharge Criteria: Patient will be discharged from PT if patient refuses treatment 3 consecutive times without medical reason, if treatment goals not met, if there is a change in medical status, if patient makes no progress towards goals or if patient is discharged from hospital.  The above assessment, treatment plan, treatment alternatives and goals were discussed  and mutually agreed upon: by patient  Luberta Mutter 12/02/2015, 2:28 PM

## 2015-12-02 NOTE — Plan of Care (Signed)
Problem: RH BOWEL ELIMINATION Goal: RH STG MANAGE BOWEL WITH ASSISTANCE STG Manage Bowel with Assistance. Outcome: Not Progressing Patient currently with flexiseal to rectum.

## 2015-12-02 NOTE — Progress Notes (Signed)
Occupational Therapy Assessment and Plan  Patient Details  Name: George Leon MRN: [id number] Date of Birth: Feb 09, 1956  OT Diagnosis: abnormal posture, cognitive deficits and muscle weakness (generalized) Rehab Potential: Rehab Potential (ACUTE ONLY): Good ELOS: 14 days   Today's Date: 12/02/2015 OT Individual Time: 1100-1230 OT Individual Time Calculation (min): 90 min     Problem List:  Patient Active Problem List   Diagnosis Date Noted  . Physical debility 12/01/2015  . Chronic pain syndrome 12/01/2015  . Acute on chronic diastolic heart failure ([place]) 12/01/2015  . Prerenal azotemia 12/01/2015  . Labile blood pressure 12/01/2015  . Tobacco abuse   . Coronary artery disease involving native coronary artery of native heart without angina pectoris   . Endotracheal tube present   . Acute respiratory failure ([place])   . CAP (community acquired pneumonia)   . Encounter for imaging study to confirm orogastric (OG) tube placement   . Respiratory failure ([place]) 11/25/2015  . Acute respiratory failure with hypercapnia ([place])   . Olecranon bursitis of right elbow 09/21/2015  . Chronic venous insufficiency 06/19/2015  . Diastolic CHF ([place]) 62/26/3335  . Proteinuria 05/22/2015  . Macular degeneration, 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  . Bilateral lower extremity edema 05/11/2015  . Issue of medical certificate for disability examination 04/21/2015  . Osteoarthritis of left knee 12/28/2014  . Osteoarthritis of right knee 12/28/2014  . Peripheral vascular disease ([place]) 08/23/2013  . CAD (coronary artery disease) 08/23/2013  . Sciatica of left side 03/30/2013  . Disorder of SI (sacroiliac) joint 03/30/2013  . GANGLION CYST, WRIST, LEFT 11/16/2010  . COPD (chronic obstructive pulmonary disease) ([place]) 10/26/2010  . Anxiety state 05/24/2009  . Osteoarthritis of multiple joints 08/25/2008  . OBESITY, MORBID 05/26/2008  . GERD 06/19/2007  . TOBACCO ABUSE 04/07/2007  . Encounter for chronic pain  management 04/07/2007  . HYPERCHOLESTEROLEMIA 02/16/2007  . HYPERTENSION, BENIGN 02/16/2007    Past Medical History:  Past Medical History  Diagnosis Date  . Hypertension   . Acute MI ([place])   . Chronic pain   . COPD (chronic obstructive pulmonary disease) ([place])   . Chronic pain syndrome 12/01/2015  . Acute on chronic diastolic heart failure ([place]) 12/01/2015   Past Surgical History:  Past Surgical History  Procedure Laterality Date  . Joint replacement    . Back surgery    . Cervical spine surgery  1988    Assessment & Plan Clinical Impression: George Leon is a 59 year old smoker with history of CAD, HTN, chronic pain, COPD who was admitted on 11/25/15 with non-productive cough, fevers, BLE edema and lethargy. He was noted to be in significant respiratory distress with hypercarbia and required ventilator support for AEDOPD and suspected PNA/Viral URI. He was treated with IV antibiotics, chest PT and steroids. He was diuresed with Bumex and hydralazine due to concerns of acute on chronic CHF. He was placed on MIND-[place] drug to help manage delirium and tolerated extubation on 11/30/15. Narcotics were held due to oversedation and M[place] drugh placed on hold. Therapy evaluations done yesterday revealing patient to be debilitated. Patient hypotensive today therefore Bumex d/c and was bolused with IVF. CIR was recommended for follow up therapy and patient cleared medically for admission today.  Patient transferred to CIR on 12/01/2015 .    Patient currently requires mod with basic self-care skills secondary to muscle weakness and muscle joint tightness and decreased initiation, decreased attention, decreased awareness, decreased problem solving, decreased safety awareness and decreased memory.  Prior to hospitalization,  patient could complete BADL with modified independent .  Patient will benefit from skilled intervention to increase independence with basic self-care skills and increase  level of independence with iADL prior to discharge home with care partner.  Anticipate patient will require 24 hour supervision and follow up home health.  OT - End of Session Activity Tolerance: Tolerates 10 - 20 min activity with multiple rests Endurance Deficit: Yes Endurance Deficit Description: rest breaks throughout session OT Assessment Rehab Potential (ACUTE ONLY): Good Barriers to Discharge: Decreased caregiver support OT Plan OT Intensity: Minimum of 1-2 x/day, 45 to 90 minutes OT Frequency: 5 out of 7 days OT Duration/Estimated Length of Stay: 14 days OT Treatment/Interventions: Balance/vestibular training;Cognitive remediation/compensation;Discharge planning;DME/adaptive equipment instruction;Functional mobility training;Patient/family education;Psychosocial support;Self Care/advanced ADL retraining;Therapeutic Activities;Therapeutic Exercise;UE/LE Strength taining/ROM;UE/LE Coordination activities;Wheelchair propulsion/positioning OT Recommendation Patient destination: Home Follow Up Recommendations: Home health OT Equipment Recommended: To be determined   Skilled Therapeutic Intervention Addressed transfers, sitting and standing balance, UE Strength and coordination, cognition.  Pt very distracted during assessment and needed cues to stayion task..  Had rectal tube and was removed during session by RN.  Sat EOB for 30 min with min to SBA.  Pt had only hospital  clothes on day 1.  Transferred to wc with mod assist.  Went over OT POC and goals.   OT Evaluation Precautions/Restrictions  Precautions Precautions: Fall Precaution Comments: monitor 02 sats; flexiseal (has COPD) Restrictions Weight Bearing Restrictions: No General   Vital Signs Therapy Vitals Temp: 97.8 F (36.6 C) Temp Source: Oral Pulse Rate: (!) 107 BP: 135/68 mmHg Patient Position (if appropriate): Sitting Oxygen Therapy SpO2: 93 % O2 Device: Not Delivered Pain Pain Assessment Pain Assessment:  0-10 Pain Score: 8  Pain Type: Chronic pain Pain Location: Back Pain Orientation: Right Pain Descriptors / Indicators: Aching Pain Onset: On-going Pain Intervention(s): Medication (See eMAR) Home Living/Prior Functioning Home Living Family/patient expects to be discharged to:: Private residence Living Arrangements: Spouse/significant other, Children Available Help at Discharge: Family, Available 24 hours/day Type of Home: Apartment Home Access: Level entry Entrance Stairs-Number of Steps: 1 4-inch step Home Layout: One level Bathroom Shower/Tub: Tub/shower unit, Industrial/product designer: Yes  Lives With: Spouse, Son IADL History Homemaking Responsibilities: Yes Meal Prep Responsibility: Primary Laundry Responsibility: Primary Cleaning Responsibility: Secondary Bill Paying/Finance Responsibility: Secondary Shopping Responsibility: No Homemaking Comments: pt on disability and takes care of home duties Education: some college 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, broadcasting, Associate Professor, Cabin crew) Leisure and Hobbies:  (tv; and walking to [employer]) Prior Function Level of Independence: Independent with basic ADLs, Requires assistive device for independence  Able to Take Stairs?: Yes Vocation: On disability Comments: enjoys cooking, Research scientist (life sciences), gets mail ADL   Vision/Perception  Vision- History Baseline Vision/History: Wears glasses Wears Glasses: Reading only Patient Visual Report: No change from baseline Vision- Assessment Vision Assessment?: No apparent visual deficits Perception Perception: Within Functional Limits  Cognition Overall Cognitive Status: Impaired/Different from baseline Arousal/Alertness: Awake/alert Orientation Level: Person;Place;Situation Year: 2016 Month: December Day of Week: Correct Memory: Impaired Memory Impairment: Other (comment);Retrieval deficit;Decreased recall of new information Immediate  Memory Recall: Sock;Blue;Bed Memory Recall: Sock;Blue;Bed Memory Recall Sock: Without Cue Memory Recall Blue: With Cue Memory Recall Bed: Without Cue Attention: Sustained Sustained Attention: Appears intact Awareness: Impaired Awareness Impairment: Anticipatory impairment Problem Solving: Impaired Problem Solving Impairment: Verbal complex Safety/Judgment:  (wanted to wash hair by sliding Hosp bed to sink) Sensation Sensation Light Touch: Appears Intact Additional Comments: Pain inRight knee,  back, and hands due to RA pain 9/10  Coordination Gross Motor Movements are Fluid and Coordinated: Yes Fine Motor Movements are Fluid and Coordinated: Yes Motor  Motor Motor: Other (comment) (weakness) Motor - Skilled Clinical Observations: weakness; easily distracted Mobility     Trunk/Postural Assessment  Cervical Assessment Cervical Assessment: Within Functional Limits Thoracic Assessment Thoracic Assessment: Exceptions to [facility] Lumbar Assessment Lumbar Assessment: Exceptions to [facility] Postural Control Postural Control: Deficits on evaluation (decreased righting reactions, stepping strategies for sitting and standing balance)  Balance Balance Balance Assessed: Yes Static Sitting Balance Static Sitting - Balance Support: Feet supported;Bilateral upper extremity supported Static Sitting - Level of Assistance: 5: Stand by assistance Dynamic Sitting Balance Dynamic Sitting - Balance Support: Bilateral upper extremity supported;Feet supported Dynamic Sitting - Level of Assistance: 5: Stand by assistance Dynamic Sitting - Balance Activities: Reaching for objects Sitting balance - Comments: Requires Min A-min guard assist sitting EOB- more assist with increased time due to fatigue. Static Standing Balance Static Standing - Balance Support: Bilateral upper extremity supported;During functional activity Static Standing - Level of Assistance: 4: Min assist Dynamic Standing Balance Dynamic  Standing - Balance Support: Bilateral upper extremity supported Dynamic Standing - Level of Assistance: 3: Mod assist Dynamic Standing - Balance Activities: Reaching across midline Extremity/Trunk Assessment RUE Assessment RUE Assessment: Within Functional Limits (4/5) LUE Assessment LUE Assessment: Within Functional Limits (4+/5)   See Function Navigator for Current Functional Status.   Refer to Care Plan for Long Term Goals  Recommendations for other services: None  Discharge Criteria: Patient will be discharged from OT if patient refuses treatment 3 consecutive times without medical reason, if treatment goals not met, if there is a change in medical status, if patient makes no progress towards goals or if patient is discharged from hospital.  The above assessment, treatment plan, treatment alternatives and goals were discussed and mutually agreed upon: by patient  Lisa Roca 12/02/2015, 6:45 PM

## 2015-12-02 NOTE — Evaluation (Deleted)
Speech Language Pathology Assessment and Plan  Patient Details  Name: George Leon MRN: [id number] Date of Birth: [birth year]  SLP Diagnosis: Cognitive Impairments  Rehab Potential: Good ELOS:      8:30-9:30   Problem List:  Patient Active Problem List   Diagnosis Date Noted  . Physical debility 12/01/2015  . Chronic pain syndrome 12/01/2015  . Acute on chronic diastolic heart failure ([place]) 12/01/2015  . Prerenal azotemia 12/01/2015  . Labile blood pressure 12/01/2015  . Tobacco abuse   . Coronary artery disease involving native coronary artery of native heart without angina pectoris   . Endotracheal tube present   . Acute respiratory failure ([place])   . CAP (community acquired pneumonia)   . Encounter for imaging study to confirm orogastric (OG) tube placement   . Respiratory failure ([place]) 11/25/2015  . Acute respiratory failure with hypercapnia ([place])   . Olecranon bursitis of right elbow 09/21/2015  . Chronic venous insufficiency 06/19/2015  . Diastolic CHF ([place]) 18/29/9371  . Proteinuria 05/22/2015  . Macular degeneration, 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  . Bilateral lower extremity edema 05/11/2015  . Issue of medical certificate for disability examination 04/21/2015  . Osteoarthritis of left knee 12/28/2014  . Osteoarthritis of right knee 12/28/2014  . Peripheral vascular disease ([place]) 08/23/2013  . CAD (coronary artery disease) 08/23/2013  . Sciatica of left side 03/30/2013  . Disorder of SI (sacroiliac) joint 03/30/2013  . GANGLION CYST, WRIST, LEFT 11/16/2010  . COPD (chronic obstructive pulmonary disease) ([place]) 10/26/2010  . Anxiety state 05/24/2009  . Osteoarthritis of multiple joints 08/25/2008  . OBESITY, MORBID 05/26/2008  . GERD 06/19/2007  . TOBACCO ABUSE 04/07/2007  . Encounter for chronic pain management 04/07/2007  . HYPERCHOLESTEROLEMIA 02/16/2007  . HYPERTENSION, BENIGN 02/16/2007   Past Medical History:  Past Medical History  Diagnosis Date  .  Hypertension   . Acute MI ([place])   . Chronic pain   . COPD (chronic obstructive pulmonary disease) ([place])   . Chronic pain syndrome 12/01/2015  . Acute on chronic diastolic heart failure ([place]) 12/01/2015   Past Surgical History:  Past Surgical History  Procedure Laterality Date  . Joint replacement    . Back surgery    . Cervical spine surgery  1988    Assessment / Plan / Recommendation Clinical Impression 59 year old with PMH: tobacco abuse, CAD, HTN, chronic pain, COPD admitted on 11/25/15 with non-productive cough, fevers, BLE edema and lethargy and noted to be in significant respiratory distress with hypercarbia due to PNA/Viral URI.  Intubated 12/17-12/22. Per MD ntoes, narcotics due to oversedation. No Speech Pathology intervention while in acute care. Pt admitted to CIR due to significantl debilitation following viral PNA. Bedside swallow assessment completed. Oral and pharyngeal phases of swallow within functional limits. Pt exhibited eficient mastication and swift oral transit. No indications of aspiration or pharyngeal residue and clearance of oral cavity. Recommend pt continue regular texture, thin liquids and pills with thin. No need for further intervention for swallow. Cognitively, pt scored within averange range on all subtests of the Cognistat Examination, however more informal observations and interactions revealed mild deficits with safety awareness, higher level problem solving and working memory. Pt appears to have a humerous/gregarious personality that may mask subtle cognitive needs. Pt may require only short term ST, max 3 times a week to address goals while in rehab.  Skilled Therapeutic Interventions            SLP Assessment  Patient will need skilled [place] Pathology  Services during CIR admission    Recommendations  SLP Diet Recommendations: Thin;Age appropriate regular solids Liquid Administration via: Cup;Straw Medication Administration: Whole meds with  liquid Supervision: Patient able to self feed Postural Changes and/or Swallow Maneuvers: Seated upright 90 degrees Oral Care Recommendations: Oral care BID Patient destination: Home Follow up Recommendations: None Equipment Recommended: None recommended by SLP    SLP Frequency 1 to 3 out of 7 days   SLP Treatment/Interventions Cognitive remediation/compensation;Functional tasks;Patient/family education;Speech/Language facilitation   Pain Pain Assessment Pain Type: Chronic pain Pain Location: Back Pain Intervention(s): RN made aware Prior Functioning Cognitive/Linguistic Baseline: Within functional limits Type of Home: Apartment  Lives With: Spouse;Son Education: some college Vocation: Other (comment)  Function:  Eating Eating                 Cognition Comprehension    Expression   Expression assist level: Expresses complex ideas: With extra time/assistive device  Social Interaction Social Interaction assist level: Interacts appropriately with others with medication or extra time (anti-anxiety, antidepressant).  Problem Solving Problem solving assist level: Solves basic 75 - 89% of the time/requires cueing 10 - 24% of the time  Memory Memory assist level: Recognizes or recalls 75 - 89% of the time/requires cueing 10 - 24% of the time   Short Term Goals: Week 1:    Refer to Care Plan for Long Term Goals  Recommendations for other services: None  Discharge Criteria: Patient will be discharged from SLP if patient refuses treatment 3 consecutive times without medical reason, if treatment goals not met, if there is a change in medical status, if patient makes no progress towards goals or if patient is discharged from hospital.  The above assessment, treatment plan, treatment alternatives and goals were discussed and mutually agreed upon: by patient  Houston Siren 12/02/2015, 11:57 AM

## 2015-12-02 NOTE — Progress Notes (Signed)
Flexiseal removed. Pt tolerated with no distress. Barrier cream applied. Pt in brief.

## 2015-12-02 NOTE — Progress Notes (Signed)
Patients bottom reddened. PA made aware and new orders written.

## 2015-12-03 DIAGNOSIS — J449 Chronic obstructive pulmonary disease, unspecified: Secondary | ICD-10-CM

## 2015-12-03 DIAGNOSIS — I10 Essential (primary) hypertension: Secondary | ICD-10-CM

## 2015-12-03 NOTE — Progress Notes (Signed)
George Leon is a 59 y.o. male 11-07-1956 [id number]  Subjective: No new complaints. No new problems.  Feeling OK.  Objective: Vital signs in last 24 hours: Temp:  [97.8 F (36.6 C)-97.9 F (36.6 C)] 97.8 F (36.6 C) (12/25 0544) Pulse Rate:  [85-107] 85 (12/25 0544) BP: (117-140)/(62-83) 117/62 mmHg (12/25 0544) SpO2:  [93 %-98 %] 98 % (12/25 0800) Weight:  [233 lb 11 oz (106 kg)] 233 lb 11 oz (106 kg) (12/25 0500) Weight change: -3 lb 4.9 oz (-1.5 kg) Last BM Date: 12/02/15  Intake/Output from previous day: 12/24 0701 - 12/25 0700 In: 480 [P.O.:480] Out: 400 [Urine:400] Last cbgs: CBG (last 3)   Recent Labs  12/02/15 0823 12/02/15 1129 12/02/15 1639  GLUCAP 92 84 126*     Physical Exam General: No apparent distress   HEENT: not dry Lungs: Normal effort. Lungs clear to auscultation, no crackles or wheezes. Cardiovascular: Regular rate and rhythm, no edema Abdomen: S/NT/ND; BS(+) Musculoskeletal:  unchanged Neurological: No new neurological deficits   Skin: clear  Aging changes Mental state: Alert, cooperative    Lab Results: BMET    Component Value Date/Time   NA 138 12/02/2015 0516   K 3.4* 12/02/2015 0516   CL 101 12/02/2015 0516   CO2 28 12/02/2015 0516   GLUCOSE 98 12/02/2015 0516   BUN 27* 12/02/2015 0516   CREATININE 0.63 12/02/2015 0516   CREATININE 0.59 06/30/2015 1017   CALCIUM 8.4* 12/02/2015 0516   GFRNONAA >60 12/02/2015 0516   GFRNONAA >89 05/18/2015 1209   GFRAA >60 12/02/2015 0516   GFRAA >89 05/18/2015 1209   CBC    Component Value Date/Time   WBC 21.4* 12/02/2015 0516   RBC 4.70 12/02/2015 0516   HGB 14.5 12/02/2015 0516   HCT 44.5 12/02/2015 0516   PLT 214 12/02/2015 0516   MCV 94.7 12/02/2015 0516   MCH 30.9 12/02/2015 0516   MCHC 32.6 12/02/2015 0516   RDW 14.2 12/02/2015 0516   LYMPHSABS 5.1* 12/02/2015 0516   MONOABS 1.9* 12/02/2015 0516   EOSABS 0.2 12/02/2015 0516   BASOSABS 0.0 12/02/2015 0516     Studies/Results: No results found.  Medications: I have reviewed the patient's current medications.  Assessment/Plan:  1. Weakness confusion balance deficits secondary to debilitation after viral PNA 2. DVT Prophylaxis/Anticoagulation: Mechanical: Sequential compression devices, below knee Bilateral lower extremities Pharmaceutical: Lovenox 3. Chronic Pain Management: hydrocodone resumed today. Monitor for oversedation and/ or hypercarbia.  4. Mood: LCSW to follow for evaluation and support.  5. Neuropsych: This patient is not capable of making decisions on his own behalf at this time. 6. Skin/Wound Care: routine pressure relief measures. Maintain adequate nutritional and hydration status.  7. Fluids/Electrolytes/Nutrition: Monitor I/O. Check lytes in am.  8. COPD with AECOPD: Leucocytosis likely due to steroids--tapers off tomorrow. Monitor for signs of infection. continue mucomyst and duonebs.  9. Acute on chronic diastolic CHF: Compensated and bumex d/c due to hypotension. Change hydralazine to po and continue cardizem bid 10. CAP: Continue Levaquin thorough 12/25 to complete antibiotic course. 11. Pre-renal azotemia: Resolving--to continue bumex bid.  12. Delirium: resolved and [place] drug on hold.  11. Hypotension: SBP 70s today--monitor for symptoms.  12. GERD: Intolerant of protonix. Will change to Nexium.     Length of stay, days: 2  Walker Kehr , MD 12/03/2015, 11:18 AM

## 2015-12-04 ENCOUNTER — Inpatient Hospital Stay (HOSPITAL_COMMUNITY): Payer: Self-pay

## 2015-12-04 ENCOUNTER — Inpatient Hospital Stay (HOSPITAL_COMMUNITY): Payer: Medicaid Other

## 2015-12-04 ENCOUNTER — Inpatient Hospital Stay (HOSPITAL_COMMUNITY): Payer: Medicaid Other | Admitting: Occupational Therapy

## 2015-12-04 ENCOUNTER — Inpatient Hospital Stay (HOSPITAL_COMMUNITY): Payer: Self-pay | Admitting: Physical Therapy

## 2015-12-04 ENCOUNTER — Inpatient Hospital Stay (HOSPITAL_COMMUNITY): Payer: Medicaid Other | Admitting: Speech Pathology

## 2015-12-04 DIAGNOSIS — R000 Tachycardia, unspecified: Secondary | ICD-10-CM | POA: Insufficient documentation

## 2015-12-04 NOTE — IPOC Note (Addendum)
Overall Plan of Care (IPOC) Patient Details Name: George Leon MRN: [id number] DOB: 09/14/1956  Admitting Diagnosis: PCCM Debility PNA  Hospital Problems: Active Problems:   HYPERCHOLESTEROLEMIA   OBESITY, MORBID   Anxiety state   Encounter for chronic pain management   HYPERTENSION, BENIGN   COPD (chronic obstructive pulmonary disease) (HCC)   GERD   Osteoarthritis of multiple joints   CAD (coronary artery disease)   Diastolic CHF (HCC)   CAP (community acquired pneumonia)   Coronary artery disease involving native coronary artery of native heart without angina pectoris   Physical debility   Chronic pain syndrome   Acute on chronic diastolic heart failure (HCC)   Prerenal azotemia   Labile blood pressure   Tachycardia     Functional Problem List: Nursing Bladder, Bowel, Endurance, Medication Management, Pain, Safety, Skin Integrity  PT Balance, Perception, Safety, Endurance, Motor, Sensory  OT Balance, Endurance, Cognition, Motor  SLP Cognition  TR         Basic ADL's: OT Grooming, Bathing, Dressing, Toileting     Advanced  ADL's: OT Simple Meal Preparation, Laundry, Light Housekeeping     Transfers: PT Bed Mobility, Bed to Chair, Car, Manufacturing systems engineer, Metallurgist: PT Ambulation, Emergency planning/management officer, Stairs     Additional Impairments: OT Other (comment) (BUE strength)  SLP None      TR      Anticipated Outcomes Item Anticipated Outcome  Self Feeding independent  Swallowing      Basic self-care  sup  Toileting  sup;   Bathroom Transfers supervison with toilet and min with shower  Bowel/Bladder  continent of bowel and bladder with min assist by discharge  Transfers  mod I  Locomotion  Supervision with LRAD  Communication     Cognition  Min assist   Pain  pain less than 3 on 1-10 scale  Safety/Judgment  will call for assistance as needed   Therapy Plan: PT Intensity: Minimum of 1-2 x/day ,45 to 90 minutes PT  Frequency: 5 out of 7 days PT Duration Estimated Length of Stay: 12-14 days OT Intensity: Minimum of 1-2 x/day, 45 to 90 minutes OT Frequency: 5 out of 7 days OT Duration/Estimated Length of Stay: 14 days SLP Intensity: Minumum of 1-2 x/day, 30 to 90 minutes SLP Frequency: 1 to 3 out of 7 days SLP Duration/Estimated Length of Stay: 7-10 days        Team Interventions: Nursing Interventions Patient/Family Education, Bladder Management, Bowel Management, Disease Management/Prevention, Pain Management, Medication Management, Skin Care/Wound Management, Discharge Planning, Psychosocial Support  PT interventions Ambulation/gait training, Training and development officer, Community reintegration, Cognitive remediation/compensation, Discharge planning, DME/adaptive equipment instruction, Functional mobility training, Pain management, Patient/family education, Neuromuscular re-education, Functional electrical stimulation, Psychosocial support, Splinting/orthotics, Stair training, Therapeutic Exercise, Therapeutic Activities, UE/LE Strength taining/ROM, UE/LE Coordination activities, Visual/perceptual remediation/compensation, Wheelchair propulsion/positioning  OT Interventions Training and development officer, Cognitive remediation/compensation, Discharge planning, DME/adaptive equipment instruction, Functional mobility training, Patient/family education, Psychosocial support, Self Care/advanced ADL retraining, Therapeutic Activities, Therapeutic Exercise, UE/LE Strength taining/ROM, UE/LE Coordination activities, Wheelchair propulsion/positioning  SLP Interventions Cognitive remediation/compensation, Functional tasks, Patient/family education, Speech/Language facilitation  TR Interventions    SW/CM Interventions Discharge Planning, Psychosocial Support, Patient/Family Education    Team Discharge Planning: Destination: PT-Home ,OT- Home , SLP-Home Projected Follow-up: PT-Home health PT, OT-  Home health OT,  SLP-None Projected Equipment Needs: PT-To be determined, OT- To be determined, SLP-None recommended by SLP Equipment Details: PT-has SPC, OT-  Patient/family involved in discharge planning: PT-  Patient,  OT-Patient, SLP-Patient  MD ELOS: 12-15 days. Medical Rehab Prognosis:  Excellent Assessment:  59 year old smoker with history of CAD, HTN, chronic pain, COPD who was admitted on 11/25/15 with non-productive cough, fevers, BLE edema and lethargy. He was noted to be in significant respiratory distress with hypercarbia and required ventilator support for AEDOPD and suspected PNA/Viral URI. He was treated with IV antibiotics, chest PT and steroids. He was diuresed with Bumex and hydralazine due to concerns of acute on chronic CHF. He was placed on [place] drug to help manage delirium and tolerated extubation on 11/30/15. Narcotics were held due to oversedation and [place] drugh placed on hold.   See Team Conference Notes for weekly updates to the plan of care

## 2015-12-04 NOTE — Progress Notes (Signed)
Occupational Therapy Session Note  Patient Details  Name: George Leon MRN: [id number] Date of Birth: [birth year]  Today's Date: 12/04/2015 OT Individual Time: 1345-1420 OT Individual Time Calculation (min): 35 min    Short Term Goals: Week 1:  OT Short Term Goal 1 (Week 1): Pt. will bathe self with min assist OT Short Term Goal 2 (Week 1): Pt will dress UB with SBA OT Short Term Goal 3 (Week 1): Pt will dress LB with min assist OT Short Term Goal 4 (Week 1): Pt will transfer to toilet with min assist OT Short Term Goal 5 (Week 1): Pt will transfer to shower with mod assist  Skilled Therapeutic Interventions/Progress Updates:    Pt seen for skilled OT to facilitate functional mobility and activity tolerance needed for ADLs: Sit to stands to RW and sit to stands without UE support. Ambulation over 200 ft with RW with close S UE strengthening with 4# dowel bar  Pt ambulated to toilet with S and then used toilet with mod I.  Pt resting in chair at end of session with all needs met and quick release belt on.  Therapy Documentation Precautions:  Precautions Precautions: Fall Precaution Comments: monitor 02 sats; flexiseal (has COPD) Restrictions Weight Bearing Restrictions: No Therapy Vitals Temp: 98.4 F (36.9 C) Temp Source: Oral Pulse Rate: 93 Resp: 18 BP: 111/73 mmHg Patient Position (if appropriate): Sitting Oxygen Therapy SpO2: 93 % O2 Device: Not Delivered Pain: Pain Assessment Pain Assessment: 0-10 Pain Score: 8  Pain Type: Chronic pain Pain Location: Back Pain Orientation: Lower Pain Descriptors / Indicators: Aching Pain Intervention(s): RN made aware ADL:   See Function Navigator for Current Functional Status.   Therapy/Group: Individual Therapy  Blenda Wisecup 12/04/2015, 2:31 PM

## 2015-12-04 NOTE — Progress Notes (Signed)
Occupational Therapy Session Note  Patient Details  Name: KYRO THOROUGHMAN MRN: [id number] Date of Birth: 05-07-1956  Today's Date: 12/04/2015 OT Individual Time: [id number] OT Individual Time Calculation (min): 53 min    Short Term Goals: Week 1:  OT Short Term Goal 1 (Week 1): Pt. will bathe self with min assist OT Short Term Goal 2 (Week 1): Pt will dress UB with SBA OT Short Term Goal 3 (Week 1): Pt will dress LB with min assist OT Short Term Goal 4 (Week 1): Pt will transfer to toilet with min assist OT Short Term Goal 5 (Week 1): Pt will transfer to shower with mod assist  Skilled Therapeutic Interventions/Progress Updates:    Pt engaged in toileting tasks and LB dressing tasks at supervision level.  Pt declined bathing or changing shirts.  Pt amb with RW to ADL apartment and practiced tub transfers.  Pt initially attempted to step into tub but required min A to complete task.  Recommended tub transfer bench.  Pt stated he would discuss with his wife.  Pt required min verbal cues for RW safety. Pt returned to room and remained in w/c with QRB in place and all needs within reach. Focus on activity tolerance, sit<>stand, standing balance, functional amb with RW, functional transfers and safety awareness to increased independence with BADLs.   Therapy Documentation Precautions:  Precautions Precautions: Fall Precaution Comments: monitor 02 sats; flexiseal (has COPD) Restrictions Weight Bearing Restrictions: No  Pain:  Pt denied pain  See Function Navigator for Current Functional Status.   Therapy/Group: Individual Therapy  Leroy Libman 12/04/2015, 9:46 AM

## 2015-12-04 NOTE — Progress Notes (Signed)
Occupational Therapy Session Note  Patient Details  Name: George Leon MRN: [id number] Date of Birth: [birth year]  Today's Date: 12/04/2015 OT Individual Time: 1110-1200 OT Individual Time Calculation (min): 50 min    Short Term Goals: Week 1:  OT Short Term Goal 1 (Week 1): Pt. will bathe self with min assist OT Short Term Goal 2 (Week 1): Pt will dress UB with SBA OT Short Term Goal 3 (Week 1): Pt will dress LB with min assist OT Short Term Goal 4 (Week 1): Pt will transfer to toilet with min assist OT Short Term Goal 5 (Week 1): Pt will transfer to shower with mod assist  Skilled Therapeutic Interventions/Progress Updates:    Pt seen for OT therapy session focusing on functional activity tolerance and IADL re-training. Pt sitting up in w/Leon upon arrival, agreeable to tx session. He self propelled w/Leon to ADL apartment for LE strengthening. In apartment, pt ambulated with RW throughout kitchen to complete simple meal prep. He required mod cuing throughout session for RW management including to take RW with him, turning RW, and staying inside RW. Pt became annoyed throughout session by therapist's cuing,stating  that he "didn't have time to deal with that (RW)." He tolerated ~45 minutes of functional standing during session, requiring one seated rest break, demonstrating good functional activity tolerance.  Pt educated throughout session regarding RW management, fall risk, goals of therapy, increasing safety, energy conservation, and d/Leon planning.   Pt ambulated back to room at end of session, left sitting in w/Leon all needs in reach and quick release belt donned.   Therapy Documentation Precautions:  Precautions Precautions: Fall Precaution Comments: monitor 02 sats; flexiseal (has COPD) Restrictions Weight Bearing Restrictions: No Pain: Pain Assessment Pain Assessment: No/denies pain  See Function Navigator for Current Functional Status.   Therapy/Group: Individual  Therapy  George Leon, George Leon 12/04/2015, 1:39 PM

## 2015-12-04 NOTE — Progress Notes (Signed)
Speech Language Pathology Daily Session Note  Patient Details  Name: George Leon MRN: [id number] Date of Birth: 18-Mar-1956  Today's Date: 12/04/2015 SLP Individual Time: [id number] SLP Individual Time Calculation (min): 30 min  Short Term Goals: Week 1: SLP Short Term Goal 1 (Week 1): Patient will utilize memory compensatory strategies to facilitate recall of new, functional information with Min A verbal and question cues.  SLP Short Term Goal 2 (Week 1): Pt will demonstrate functional  problem solving with complex tasks with Min A verbal cues.   Skilled Therapeutic Interventions: Skilled treatment session focused on cognitive goals. SLP facilitated session by providing supervision question cues for recall of his current medications and their functions. Patient reported the medications are the same but the frequency has increased, therefore, patient was educated on utilizing a pill box at discharge to maximize accuracy and safety with medication management, he verbalized understanding and agreement. Organization of a pill box will be performed at next scheduled session. Patient left upright in wheelchair with quick release belt in place and all needs within reach. Continue with current plan of care.     Function:  Cognition Comprehension Comprehension assist level: Follows basic conversation/direction with extra time/assistive device  Expression   Expression assist level: Expresses basic needs/ideas: With extra time/assistive device  Social Interaction Social Interaction assist level: Interacts appropriately 90% of the time - Needs monitoring or encouragement for participation or interaction.  Problem Solving Problem solving assist level: Solves basic 90% of the time/requires cueing < 10% of the time  Memory Memory assist level: Recognizes or recalls 75 - 89% of the time/requires cueing 10 - 24% of the time    Pain Pain Assessment Pain Assessment: No/denies pain  Therapy/Group:  Individual Therapy  [name] 12/04/2015, 3:40 PM

## 2015-12-04 NOTE — Progress Notes (Signed)
Physical Therapy Session Note  Patient Details  Name: George Leon MRN: [id number] Date of Birth: 1956/05/03  Today's Date: 12/04/2015 PT Individual Time: 1030-1100 PT Individual Time Calculation (min): 30 min   Short Term Goals: Week 1:  PT Short Term Goal 1 (Week 1): Pt will perform bed mobility mod I PT Short Term Goal 2 (Week 1): Pt will perform stand pivot transfer min guard PT Short Term Goal 3 (Week 1): Pt will perform gait with LRAD x150' with min guard PT Short Term Goal 4 (Week 1): Pt will perform ascent/descent of 4 -6inch stairs with minA  Skilled Therapeutic Interventions/Progress Updates:   Session focused on dynamic gait training through obstacle course to simulate home environment navigating cones and threshold and picking up items from floor and seated therex for HEP for pt to do in the room independently (heel/toe raises seated, LAQ, and marches). Pt demonstrates decreased safety awareness throughout session with verbal cues for attention and safety.   Therapy Documentation Precautions:  Precautions Precautions: Fall Precaution Comments: monitor 02 sats; flexiseal (has COPD) Restrictions Weight Bearing Restrictions: No   Pain:  Reports generalized back pain. Premedicated per report.    See Function Navigator for Current Functional Status.   Therapy/Group: Individual Therapy  Canary Brim Ivory Broad, PT, DPT  12/04/2015, 11:01 AM

## 2015-12-04 NOTE — Progress Notes (Signed)
Speech Language Pathology Daily Session Note  Patient Details  Name: George Leon MRN: [id number] Date of Birth: 04/07/1956  Today's Date: 12/04/2015 SLP Individual Time: 0825-0852 SLP Individual Time Calculation (min): 27 min  Short Term Goals: Week 1: SLP Short Term Goal 1 (Week 1): Pt will utilize memoety aids to facilitate recall of complex information for increased safety with min verbal cues SLP Short Term Goal 2 (Week 1): Pt willdemonstrate complex probling solving abilities during functional ADL with modified independence  Skilled Therapeutic Interventions:  [name].  SLP facilitated the session with a money management task targeting functional problem solving.  Pt counted money, generated values when named, and made change via mental math calculations with overall supervision cues.  Pt requested assistance to use the bathroom independently but needed min verbal cues for safety due to impulsivity prior to transfer.  Pt was handed off to OT in bathroom while seated on commode.  Continue per current plan of care.    Function:  Eating Eating                 Cognition Comprehension Comprehension assist level: Follows basic conversation/direction with extra time/assistive device  Expression   Expression assist level: Expresses basic needs/ideas: With extra time/assistive device  Social Interaction Social Interaction assist level: Interacts appropriately 75 - 89% of the time - Needs redirection for appropriate language or to initiate interaction.  Problem Solving Problem solving assist level: Solves basic 90% of the time/requires cueing < 10% of the time  Memory Memory assist level: Recognizes or recalls 75 - 89% of the time/requires cueing 10 - 24% of the time    Pain Pain Assessment Pain Assessment: No/denies pain  Therapy/Group: Individual Therapy  Lanny Donoso, Selinda Orion 12/04/2015, 12:10 PM

## 2015-12-04 NOTE — Progress Notes (Signed)
[facility] PHYSICAL MEDICINE & REHABILITATION     PROGRESS NOTE    Subjective/Complaints:  Patient seen this AM sitting on edge of his bed. He is noted to be impulsive try to get up. He states he had a good weekend and believes he is getting stronger.  ROS: Denies CP, SOB, N/V/D  Objective: Vital Signs: Blood pressure 126/80, pulse 102, temperature 98 F (36.7 C), temperature source Oral, resp. rate 18, height 5\' 7"  (1.702 m), weight 106 kg (233 lb 11 oz), SpO2 93 %. No results found.  Recent Labs  12/02/15 0516  WBC 21.4*  HGB 14.5  HCT 44.5  PLT 214    Recent Labs  12/02/15 0516  NA 138  K 3.4*  CL 101  GLUCOSE 98  BUN 27*  CREATININE 0.63  CALCIUM 8.4*   CBG (last 3)   Recent Labs  12/02/15 0823 12/02/15 1129 12/02/15 1639  GLUCAP 92 84 126*    Wt Readings from Last 3 Encounters:  12/03/15 106 kg (233 lb 11 oz)  11/30/15 102.8 kg (226 lb 10.1 oz)  09/21/15 112.492 kg (248 lb)    Physical Exam:  BP 126/80 mmHg  Pulse 102  Temp(Src) 98 F (36.7 C) (Oral)  Resp 18  Ht 5\' 7"  (1.702 m)  Wt 106 kg (233 lb 11 oz)  BMI 36.59 kg/m2  SpO2 93% Constitutional:He appears well-developed and well-nourished. NAD. Obese male  HENT: Normocephalic and atraumatic.  Eyes: Conjunctivae and EOM are normal.  Cardiovascular: Regular rhythm. Tachycardia present.  Respiratory: Effort normal and breath sounds normal. No respiratory distress. He has no wheezes.  GI: Soft. Bowel sounds are normal.  Musculoskeletal: He exhibits no edema and no tenderness  Neurological: He is alert and oriented.  Speech clear.  Follows basic commands without difficulty. Makes good eye contact with examiner.  Has poor insight/awareness of deficits Sensation intact to light touch Motor: 4+/5 proximal to distal throughout  A&Ox3 Skin: Skin is warm and dry. No rash noted. No erythema.  Scattered abrasions  Psychiatric: Cognition and memory are impaired. He expresses inappropriate  judgment.  Impulsive  Assessment/Plan: 1. Functional deficits secondary to debilitation after viral PNA which require 3+ hours per day of interdisciplinary therapy in a comprehensive inpatient rehab setting. Physiatrist is providing close team supervision and 24 hour management of active medical problems listed below. Physiatrist and rehab team continue to assess barriers to discharge/monitor patient progress toward functional and medical goals.  Function:  Bathing Bathing position   Position: Sitting EOB  Bathing parts Body parts bathed by patient: Left arm, Chest, Abdomen, Right upper leg, Left upper leg Body parts bathed by helper: Back, Left lower leg, Right lower leg, Buttocks  Bathing assist Assist Level: Touching or steadying assistance(Pt > 75%)      Upper Body Dressing/Undressing Upper body dressing   What is the patient wearing?: Hospital gown                Upper body assist Assist Level: Touching or steadying assistance(Pt > 75%)      Lower Body Dressing/Undressing Lower body dressing   What is the patient wearing?: Hospital Gown, Non-skid slipper socks           Non-skid slipper socks- Performed by helper: Don/doff left sock, Don/doff right sock                  Lower body assist Assist for lower body dressing: Touching or steadying assistance (Pt > 75%)  Toileting Toileting Toileting activity did not occur: Safety/medical concerns Toileting steps completed by patient: Adjust clothing prior to toileting, Performs perineal hygiene, Adjust clothing after toileting   Toileting Assistive Devices: Grab bar or rail  Toileting assist Assist level: More than reasonable time   Transfers Chair/bed transfer   Chair/bed transfer method: Stand pivot Chair/bed transfer assist level: Touching or steadying assistance (Pt > 75%)       Locomotion Ambulation     Max distance: 100 Assist level: Touching or steadying assistance (Pt > 75%)   Wheelchair    Type: Manual Max wheelchair distance: 150 Assist Level: Touching or steadying assistance (Pt > 75%)  Cognition Comprehension Comprehension assist level: Follows basic conversation/direction with no assist  Expression Expression assist level: Expresses complex ideas: With extra time/assistive device  Social Interaction Social Interaction assist level: Interacts appropriately with others with medication or extra time (anti-anxiety, antidepressant).  Problem Solving Problem solving assist level: Solves basic 75 - 89% of the time/requires cueing 10 - 24% of the time  Memory Memory assist level: Recognizes or recalls 75 - 89% of the time/requires cueing 10 - 24% of the time    Medical Problem List and Plan: 1. Weakness confusion balance deficits secondary to debilitation after viral PNA  Continue CIR 2. DVT Prophylaxis/Anticoagulation: Mechanical: Sequential compression devices, below knee Bilateral lower extremities Pharmaceutical: Lovenox 3. Chronic Pain Management: hydrocodone resumed today. Monitor for oversedation and/ or hypercarbia.  4. Mood: LCSW to follow for evaluation and support.  5. Neuropsych: This patient is capable of making decisions on his own behalf at this time. 6. Skin/Wound Care: routine pressure relief measures. Maintain adequate nutritional and hydration status.  7. Fluids/Electrolytes/Nutrition: Monitor I/O.   BMP within acceptable range on 12/24, with the exception of mild hypokalemia. Will continue to monitor.  8. COPD with AECOPD: Leucocytosis likely due to steroids -- last dose on 12/24  Patient afebrile  Cont to monitor for signs of infection.   Continue mucomyst and duonebs.  9. Acute on chronic diastolic CHF: Compensated and bumex d/c due to hypotension. Change hydralazine to po and continue cardizem bid 10. CAP: Continue Levaquin thorough 12/25 to complete antibiotic course. 11. Pre-renal azotemia: Resolving--to continue bumex bid.  12. Delirium:  resolved and [place] drug on hold.  11. Hypotension: Improved  12. GERD: Intolerant of protonix. Changed to Nexium.  13. Tachycardia: Likely secondary to deconditioning.  will continue to monitor   LOS (Days) 3 A FACE TO FACE EVALUATION WAS PERFORMED  Joslynne Klatt Lorie Phenix 12/04/2015 9:31 AM

## 2015-12-05 ENCOUNTER — Inpatient Hospital Stay (HOSPITAL_COMMUNITY): Payer: Medicaid Other | Admitting: Occupational Therapy

## 2015-12-05 ENCOUNTER — Inpatient Hospital Stay (HOSPITAL_COMMUNITY): Payer: Medicaid Other | Admitting: Physical Therapy

## 2015-12-05 ENCOUNTER — Inpatient Hospital Stay (HOSPITAL_COMMUNITY): Payer: Medicaid Other | Admitting: Speech Pathology

## 2015-12-05 NOTE — Progress Notes (Signed)
Social Work  Social Work Assessment and Plan  Patient Details  Name: George Leon MRN: [id number] Date of Birth: Dec 24, 1955  Today's Date: 12/04/2015  Problem List:  Patient Active Problem List   Diagnosis Date Noted  . Tachycardia   . Physical debility 12/01/2015  . Chronic pain syndrome 12/01/2015  . Acute on chronic diastolic heart failure ([place]) 12/01/2015  . Prerenal azotemia 12/01/2015  . Labile blood pressure 12/01/2015  . Tobacco abuse   . Coronary artery disease involving native coronary artery of native heart without angina pectoris   . Endotracheal tube present   . Acute respiratory failure ([place])   . CAP (community acquired pneumonia)   . Encounter for imaging study to confirm orogastric (OG) tube placement   . Respiratory failure ([place]) 11/25/2015  . Acute respiratory failure with hypercapnia ([place])   . Olecranon bursitis of right elbow 09/21/2015  . Chronic venous insufficiency 06/19/2015  . Diastolic CHF ([place]) [id number]  . Proteinuria 05/22/2015  . Macular degeneration, 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  . Bilateral lower extremity edema 05/11/2015  . Issue of medical certificate for disability examination 04/21/2015  . Osteoarthritis of left knee 12/28/2014  . Osteoarthritis of right knee 12/28/2014  . Peripheral vascular disease ([place]) 08/23/2013  . CAD (coronary artery disease) 08/23/2013  . Sciatica of left side 03/30/2013  . Disorder of SI (sacroiliac) joint 03/30/2013  . GANGLION CYST, WRIST, LEFT 11/16/2010  . COPD (chronic obstructive pulmonary disease) ([place]) 10/26/2010  . Anxiety state 05/24/2009  . Osteoarthritis of multiple joints 08/25/2008  . OBESITY, MORBID 05/26/2008  . GERD 06/19/2007  . TOBACCO ABUSE 04/07/2007  . Encounter for chronic pain management 04/07/2007  . HYPERCHOLESTEROLEMIA 02/16/2007  . HYPERTENSION, BENIGN 02/16/2007   Past Medical History:  Past Medical History  Diagnosis Date  . Hypertension   . Acute MI ([place])   . Chronic pain    . COPD (chronic obstructive pulmonary disease) ([place])   . Chronic pain syndrome 12/01/2015  . Acute on chronic diastolic heart failure ([place]) 12/01/2015   Past Surgical History:  Past Surgical History  Procedure Laterality Date  . Joint replacement    . Back surgery    . Cervical spine surgery  1988   Social History:  reports that he has been smoking Cigarettes.  He has been smoking about 1.00 pack per day. He has never used smokeless tobacco. He reports that he does not drink alcohol or use illicit drugs.  Family / Support Systems Marital Status: Married Patient Roles: Spouse, Parent Spouse/Significant Other: wife, Princeamir Asad @ (H) [phone] or [phone] Children: son, Quita Skye (25 yrs, living at home) @ (C) [phone] Anticipated Caregiver: wife and son, Quita Skye Ability/Limitations of Caregiver: wife works 3 pm until 12 at [employer]; Adam works first shift. 24/7 supervision is avalable.  Son does not return to work, however, until 12/12/15. Family Dynamics: Pt notes that wife and son are very supportive and denies any concerns about home assist available.  Social History Preferred language: English Religion: Baptist Cultural Background: NA Read: Yes Write: Yes Employment Status: Disabled Date Retired/Disabled/Unemployed: Aug 2016 Legal Hisotry/Current Legal Issues: None Guardian/Conservator: None - per MD, pt is capable of making decisions on his own behalf   Abuse/Neglect Physical Abuse: Denies Verbal Abuse: Denies Sexual Abuse: Denies Exploitation of patient/patient's resources: Denies Self-Neglect: Denies  Emotional Status Pt's affect, behavior adn adjustment status: Pt very pleasant and humorous during assessment interview.  Able to complete interview without difficulty.  He denies any s/s of emotional  distress and is eager to get home.  Deferred any formal depression screen at this time, however, will monitor and refer to neuropsychology as indicated. Recent  Psychosocial Issues: Chronic health issues Pyschiatric History: Pt with h/o "anxiety d/o" and received meds via primary MD Substance Abuse History: Chart notes pt has h/o ETOH abuse.  Patient / Family Perceptions, Expectations & Goals Pt/Family understanding of illness & functional limitations: Pt with basic understanding of his "double pneumonia" and of his current debility/ need for CIR. Premorbid pt/family roles/activities: Pt independent at home.  Admits he is fairly sedentary and uses cane for mobility. Anticipated changes in roles/activities/participation: little change in roles expected if pt able to reach supervision goals as family was providing this intermittently PTA. Pt/family expectations/goals: "I just hope I can get home before the new year."  [employer]: None Premorbid Home Care/DME Agencies: None Transportation available at discharge: yes  Discharge Planning Living Arrangements: Spouse/significant other, Children Support Systems: Spouse/significant other Type of Residence: Private residence Insurance Resources: [employer] (specify county) (currently in "spend down" status with Medicaid) Financial Resources: [employer] Screen Referred: No Living Expenses: Higher education careers adviser Management: Spouse Does the patient have any problems obtaining your medications?: Yes (Describe) (no insurance) Home Management: pt and family share Patient/Family Preliminary Plans: Pt to return home with his wife and adult son who can provide very close to 24/7 assistance (~ hour in afternoon) Social Work Anticipated Follow Up Needs: HH/OP Expected length of stay: 7-9 days  Clinical Impression Pleasant, humorous gentleman here following pneumonia very deconditioned.  Making good gains and expect short LOS.  Good family support from wife and adult son but there may be 1-2 hours pt alone in the afternoon.  He denies any s/s of emotional distress - will monitor.  To follow for  d/c planning and support.  Norvell Caswell 12/04/2015, 4:07 PM

## 2015-12-05 NOTE — Progress Notes (Signed)
Occupational Therapy Session Note  Patient Details  Name: George Leon MRN: [id number] Date of Birth: 09-Apr-1956  Today's Date: 12/05/2015 OT Individual Time: [id number] and [id number] OT Individual Time Calculation (min): 57 min and 55 min    Short Term Goals: Week 1:  OT Short Term Goal 1 (Week 1): Pt. will bathe self with min assist OT Short Term Goal 2 (Week 1): Pt will dress UB with SBA OT Short Term Goal 3 (Week 1): Pt will dress LB with min assist OT Short Term Goal 4 (Week 1): Pt will transfer to toilet with min assist OT Short Term Goal 5 (Week 1): Pt will transfer to shower with mod assist  Skilled Therapeutic Interventions/Progress Updates:  Session 1: Upon entering the room, pt seated on EOB awaiting therapist. Pt with c/o chronic pain in lower back of 5/10 pain this session. Pt declined bathing this session and requested to change clothing while seated on EOB. Pt donned clothing items with steady assistance for LB clothing management. Pt able to don and tie B shoes this session while seated on EOB. Pt requiring min verbal cues for proper use of RW as well as proper technique for tasks for safety. Pt standing at sink with supervision and one UE supported while performing grooming tasks. Pt able to stand 7 minutes during task before requiring seated rest break. Pt obtaining all clothing items from floor and placed in bag. Pt ambulating with RW and dirty clothes bag to place items in proper place. OT educating pt on progress towards goals and discharge planning. Pt verbalizing understanding but showing limited insight by insisting he return home today. Pt returning to bed at end of session with bed alarm activated and call bell within reach upon exiting the room.   Session 2: Upon entering the room, pt seated on EOB awaiting therapist with no c/o pain this session. Pt required coaxing for participation but finally agreeable to OT intervention. Pt ambulating 175' with RW and steady  assist for safety to ADL apartment. Pt demonstrating ability to make bed and refused use of RW for task. Pt having 1 anterior LOB where he caught himself on bed to self correct. Pt requiring steady assist throughout remainder of task. OT educated and demonstrated use of RW for transfer onto TTB. Pt returning demonstration with close supervision for safety during simulated practice. OT recommended safety treads and non slip bathmat in order to decrease fall risk when bathing. Pt verbalized understanding. Pt ambulating back to room in same manner and needed rest break secondary to increased fatigue. OT educating pt on HHOT recommendation and tentative discharge date from team conference. Pt verbalized understanding. Pt returned to bed with call bell and all needed items within reach. Bed alarm activated.   Therapy Documentation Precautions:  Precautions Precautions: Fall Precaution Comments: monitor 02 sats; flexiseal (has COPD) Restrictions Weight Bearing Restrictions: No General:   Vital Signs: Therapy Vitals Pulse Rate: 94 Resp: 18 Patient Position (if appropriate): Sitting Oxygen Therapy SpO2: 95 % O2 Device: Not Delivered  See Function Navigator for Current Functional Status.   Therapy/Group: Individual Therapy  [name] 12/05/2015, 11:30 AM

## 2015-12-05 NOTE — Progress Notes (Signed)
Speech Language Pathology Daily Session Note  Patient Details  Name: George Leon MRN: [id number] Date of Birth: 1956/02/23  Today's Date: 12/05/2015 SLP Individual Time: 1430-1505 SLP Individual Time Calculation (min): 35 min  Short Term Goals: Week 1: SLP Short Term Goal 1 (Week 1): Patient will utilize memory compensatory strategies to facilitate recall of new, functional information with Min A verbal and question cues.  SLP Short Term Goal 2 (Week 1): Pt will demonstrate functional  problem solving with complex tasks with Min A verbal cues.   Skilled Therapeutic Interventions: Skilled treatment session focused on cognitive goals. SLP facilitated session by providing supervision verbal cues for functional problem solving and organization during a mildly complex and novel medication management task. Patient requested to walk around "to stretch out his back" and required Min A verbal cues for safety with walker during ambulation and while voiding. Patient left sitting EOB with all needs within reach and bed alarm on. Continue with current plan of care.    Function:  Cognition Comprehension Comprehension assist level: Follows basic conversation/direction with extra time/assistive device  Expression   Expression assist level: Expresses basic needs/ideas: With extra time/assistive device  Social Interaction Social Interaction assist level: Interacts appropriately 90% of the time - Needs monitoring or encouragement for participation or interaction.  Problem Solving Problem solving assist level: Solves basic 90% of the time/requires cueing < 10% of the time  Memory Memory assist level: Recognizes or recalls 75 - 89% of the time/requires cueing 10 - 24% of the time    Pain Pain Assessment Pain Score: 7  Pain Type: Chronic pain Pain Location: Back Pain Descriptors / Indicators: Aching Pain Intervention(s): Medication (See eMAR)  Therapy/Group: Individual Therapy  Jermarcus Mcfadyen,  Orvie Caradine 12/05/2015, 3:16 PM

## 2015-12-05 NOTE — Progress Notes (Signed)
Physical Therapy Session Note  Patient Details  Name: George Leon MRN: [id number] Date of Birth: 14-Dec-1955  Today's Date: 12/05/2015 PT Individual Time: 0900-1000 PT Individual Time Calculation (min): 60 min   Short Term Goals: Week 1:  PT Short Term Goal 1 (Week 1): Pt will perform bed mobility mod I PT Short Term Goal 2 (Week 1): Pt will perform stand pivot transfer min guard PT Short Term Goal 3 (Week 1): Pt will perform gait with LRAD x150' with min guard PT Short Term Goal 4 (Week 1): Pt will perform ascent/descent of 4 -6inch stairs with minA  Skilled Therapeutic Interventions/Progress Updates:   Pt received in room seated on EOB.  Discussed goals of therapy with pt, home set up, assistance and equipment available at D/C and PLOF.  Pt reports using SPC PTA and does not own RW.  Pt performed gait 150' to/from gym with RW and min A with verbal cues for more upright posture, decreased WB through UE, to widen BOS and for full foot clearance bilaterally (pt tends to ambulate with very flexed posture, RLE adduction during swing and shuffling gait).  In gym performed BERG balance assessment for AD recommendations at home; see below.  Pt reports that when he exits his car he has to ambulate downhill on a sidewalk + one step negotiation to enter apartment.  Discussed going outside later this week to practice uphill/downhill negotiation.  Demonstrated to pt safe one step/curb negotiation with RW; pt gave repeat demonstration x 2 reps on 4" step with RW and min A.  Performed gait assessment x 50' in controlled environment with changes in direction as well as 8 stair negotiation (4") with use of SPC in RUE with min A overall for gait and min-mod A for stairs due to intermittent lateral hip and knee instability and increased imbalance and decreased endurance.  Will continue to recommend use of RW for home and community mobility.  Returned to room and pt returned to sitting EOB; pt left with all items  within reach and bed alarm on.  Throughout session pt maintained Sp02 >93% and HR: 109 bpm.  Therapy Documentation Precautions:  Precautions Precautions: Fall Precaution Comments: monitor 02 sats; flexiseal (has COPD) Restrictions Weight Bearing Restrictions: No Pain: Pain Assessment Pain Score: 10-Worst pain ever Pain Type: Chronic pain Pain Location: Back Pain Descriptors / Indicators: Aching Pain Intervention(s): Medication (See eMAR)  Balance: Standardized Balance Assessment Standardized Balance Assessment: Berg Balance Test Berg Balance Test Sit to Stand: Able to stand  independently using hands Standing Unsupported: Able to stand safely 2 minutes Sitting with Back Unsupported but Feet Supported on Floor or Stool: Able to sit safely and securely 2 minutes Stand to Sit: Controls descent by using hands Transfers: Able to transfer safely, minor use of hands Standing Unsupported with Eyes Closed: Able to stand 10 seconds with supervision Standing Ubsupported with Feet Together: Able to place feet together independently and stand for 1 minute with supervision From Standing, Reach Forward with Outstretched Arm: Can reach confidently >25 cm (10") From Standing Position, Pick up Object from Floor: Able to pick up shoe, needs supervision From Standing Position, Turn to Look Behind Over each Shoulder: Looks behind from both sides and weight shifts well Turn 360 Degrees: Needs close supervision or verbal cueing Standing Unsupported, Alternately Place Feet on Step/Stool: Able to stand independently and complete 8 steps >20 seconds Standing Unsupported, One Foot in Front: Able to plae foot ahead of the other independently and hold 30  seconds Standing on One Leg: Tries to lift leg/unable to hold 3 seconds but remains standing independently Total Score: 43 Patient demonstrates increased fall risk as noted by score of 43/56 on Berg Balance Scale.  (<36= high risk for falls, close to 100%;  37-45 significant >80%; 46-51 moderate >50%; 52-55 lower >25%)   See Function Navigator for Current Functional Status.   Therapy/Group: Individual Therapy  George Leon 12/05/2015, 12:12 PM

## 2015-12-05 NOTE — Care Management Note (Signed)
Inpatient [facility] Individual Statement of Services  Patient Name:  George Leon  Date:  12/05/2015  Welcome to the [facility].  Our goal is to provide you with an individualized program based on your diagnosis and situation, designed to meet your specific needs.  With this comprehensive rehabilitation program, you will be expected to participate in at least 3 hours of rehabilitation therapies Monday-Friday, with modified therapy programming on the weekends.  Your rehabilitation program will include the following services:  Physical Therapy (PT), Occupational Therapy (OT), Speech Therapy (ST), 24 hour per day rehabilitation nursing, Therapeutic Recreaction (TR), Neuropsychology, Case Management (Social Worker), Rehabilitation Medicine, Nutrition Services and Pharmacy Services  Weekly team conferences will be held on Tuesdays to discuss your progress.  Your Social Worker will talk with you frequently to get your input and to update you on team discussions.  Team conferences with you and your family in attendance may also be held.  Expected length of stay: 7-9 days  Overall anticipated outcome: supervision  Depending on your progress and recovery, your program may change. Your Social Worker will coordinate services and will keep you informed of any changes. Your Social Worker's name and contact numbers are listed  below.  The following services may also be recommended but are not provided by the [facility] will be made to provide these services after discharge if needed.  Arrangements include referral to agencies that provide these services.  Your insurance has been verified to be:  Medicaid pending deductible being met Your primary doctor is:  Dr. Vance Gather  Pertinent information will be shared with your doctor and  your insurance company.  Social Worker:  [place], [place] or ([phone]   Information discussed with and copy given to patient by: Lennart Pall, 12/05/2015, 3:13 PM

## 2015-12-05 NOTE — Plan of Care (Signed)
Problem: RH Dressing Goal: LTG Patient will perform upper body dressing (OT) LTG Patient will perform upper body dressing with assist, with/without cues (OT).  Upgraded secondary to pt progress  Problem: RH Toileting Goal: LTG Patient will perform toileting w/assist, cues/equip (OT) LTG: Patient will perform toiletiing (clothes management/hygiene) with assist, with/without cues using equipment (OT)  Goal upgraded secondary to pt progress   Problem: RH Toilet Transfers Goal: LTG Patient will perform toilet transfers w/assist (OT) LTG: Patient will perform toilet transfers with assist, with/without cues using equipment (OT)  Upgraded secondary to pt progress  Problem: RH Tub/Shower Transfers Goal: LTG Patient will perform tub/shower transfers w/assist (OT) LTG: Patient will perform tub/shower transfers with assist, with/without cues using equipment (OT)  Goal upgraded secondary to pt progress

## 2015-12-05 NOTE — Progress Notes (Signed)
[facility] PHYSICAL MEDICINE & REHABILITATION     PROGRESS NOTE    Subjective/Complaints:  Pt sitting up on the edge of the bed this AM.  He states he had difficulty going to sleep because of a pain in his lower back.  This is chronic in nature and he says it is, "just a pain".  This improved with pain meds.    ROS: +back pain.  Denies CP, SOB, N/V/D  Objective: Vital Signs: Blood pressure 121/66, pulse 94, temperature 98.1 F (36.7 C), temperature source Oral, resp. rate 18, height 5\' 7"  (1.702 m), weight 106 kg (233 lb 11 oz), SpO2 95 %. No results found. No results for input(s): WBC, HGB, HCT, PLT in the last 72 hours. No results for input(s): NA, K, CL, GLUCOSE, BUN, CREATININE, CALCIUM in the last 72 hours.  Invalid input(s): CO CBG (last 3)   Recent Labs  12/02/15 1129 12/02/15 1639  GLUCAP 84 126*    Wt Readings from Last 3 Encounters:  12/03/15 106 kg (233 lb 11 oz)  11/30/15 102.8 kg (226 lb 10.1 oz)  09/21/15 112.492 kg (248 lb)    Physical Exam:  BP 121/66 mmHg  Pulse 94  Temp(Src) 98.1 F (36.7 C) (Oral)  Resp 18  Ht 5\' 7"  (1.702 m)  Wt 106 kg (233 lb 11 oz)  BMI 36.59 kg/m2  SpO2 95% Constitutional:He appears well-developed and well-nourished. NAD. Obese male  HENT: Normocephalic and atraumatic.  Eyes: Conjunctivae and EOM are normal.  Cardiovascular: Regular rhythm and rate.  Respiratory: Effort normal and breath sounds normal. No respiratory distress. He has no wheezes.  GI: Soft. Bowel sounds are normal.  Musculoskeletal: He exhibits no edema and no tenderness  Neurological: He is alert and oriented.  Speech clear.  Follows basic commands without difficulty. Makes good eye contact with examiner.  Has poor insight/awareness of deficits (improving) Motor: 4+/5 proximal to distal throughout  Skin: Skin is warm and dry. No rash noted. No erythema.  Scattered abrasions  Psychiatric: Affect and behavior normal. Impulsive  Assessment/Plan: 1.  Functional deficits secondary to debilitation after viral PNA which require 3+ hours per day of interdisciplinary therapy in a comprehensive inpatient rehab setting. Physiatrist is providing close team supervision and 24 hour management of active medical problems listed below. Physiatrist and rehab team continue to assess barriers to discharge/monitor patient progress toward functional and medical goals.  Function:  Bathing Bathing position Bathing activity did not occur: Refused Position: Sitting EOB  Bathing parts Body parts bathed by patient: Left arm, Chest, Abdomen, Right upper leg, Left upper leg Body parts bathed by helper: Back, Left lower leg, Right lower leg, Buttocks  Bathing assist Assist Level: Touching or steadying assistance(Pt > 75%)      Upper Body Dressing/Undressing Upper body dressing   What is the patient wearing?: Pull over shirt/dress     Pull over shirt/dress - Perfomed by patient: Thread/unthread right sleeve, Thread/unthread left sleeve, Put head through opening, Pull shirt over trunk          Upper body assist Assist Level: Set up      Lower Body Dressing/Undressing Lower body dressing   What is the patient wearing?: Underwear, Pants, Non-skid slipper socks Underwear - Performed by patient: Thread/unthread right underwear leg, Thread/unthread left underwear leg, Pull underwear up/down   Pants- Performed by patient: Thread/unthread right pants leg, Thread/unthread left pants leg, Pull pants up/down, Fasten/unfasten pants   Non-skid slipper socks- Performed by patient: Don/doff right sock, Don/doff  left sock Non-skid slipper socks- Performed by helper: Don/doff left sock, Don/doff right sock                  Lower body assist Assist for lower body dressing: Supervision or verbal cues      Toileting Toileting Toileting activity did not occur: Safety/medical concerns Toileting steps completed by patient: Adjust clothing prior to toileting,  Performs perineal hygiene, Adjust clothing after toileting   Toileting Assistive Devices: Grab bar or rail  Toileting assist Assist level: More than reasonable time   Transfers Chair/bed transfer   Chair/bed transfer method: Stand pivot Chair/bed transfer assist level: Touching or steadying assistance (Pt > 75%)       Locomotion Ambulation     Max distance: 50 Assist level: Touching or steadying assistance (Pt > 75%)   Wheelchair   Type: Manual Max wheelchair distance: 150 Assist Level: Touching or steadying assistance (Pt > 75%)  Cognition Comprehension Comprehension assist level: Follows basic conversation/direction with extra time/assistive device  Expression Expression assist level: Expresses basic needs/ideas: With extra time/assistive device  Social Interaction Social Interaction assist level: Interacts appropriately 90% of the time - Needs monitoring or encouragement for participation or interaction.  Problem Solving Problem solving assist level: Solves basic 90% of the time/requires cueing < 10% of the time  Memory Memory assist level: Recognizes or recalls 75 - 89% of the time/requires cueing 10 - 24% of the time    Medical Problem List and Plan: 1. Weakness confusion balance deficits secondary to debilitation after viral PNA  Continue CIR 2. DVT Prophylaxis/Anticoagulation: Mechanical: Sequential compression devices, below knee Bilateral lower extremities Pharmaceutical: Lovenox 3. Chronic Pain Management: hydrocodone resumed today. Monitor for oversedation and/ or hypercarbia.   4. Mood: LCSW to follow for evaluation and support.  5. Neuropsych: This patient is capable of making decisions on his own behalf at this time. 6. Skin/Wound Care: routine pressure relief measures. Maintain adequate nutritional and hydration status.  7. Fluids/Electrolytes/Nutrition: Monitor I/O.   BMP within acceptable range on 12/24, with the exception of mild hypokalemia. Will  continue to monitor.  8. COPD with AECOPD: Leucocytosis likely due to steroids -- last dose on 12/24  Patient afebrile  Cont to monitor for signs of infection.   Continue mucomyst and duonebs.  9. Acute on chronic diastolic CHF: Compensated and bumex d/c due to hypotension. Changed hydralazine to po and continue cardizem bid 10. CAP: Continue Levaquin thorough 12/25 to complete antibiotic course. 11. Pre-renal azotemia: Resolving--to continue bumex bid.  12. Delirium: resolved and [place] drug on hold.  11. Hypotension: Improved  12. GERD: Intolerant of protonix. Changed to Nexium.  13. Tachycardia: Likely secondary to deconditioning.  Improved today  will continue to monitor   LOS (Days) 4 A FACE TO FACE EVALUATION WAS PERFORMED  George Leon Phenix 12/05/2015 9:22 AM

## 2015-12-06 ENCOUNTER — Inpatient Hospital Stay (HOSPITAL_COMMUNITY): Payer: Medicaid Other

## 2015-12-06 ENCOUNTER — Inpatient Hospital Stay (HOSPITAL_COMMUNITY): Payer: Medicaid Other | Admitting: Occupational Therapy

## 2015-12-06 ENCOUNTER — Inpatient Hospital Stay (HOSPITAL_COMMUNITY): Payer: Self-pay | Admitting: Physical Therapy

## 2015-12-06 DIAGNOSIS — D72829 Elevated white blood cell count, unspecified: Secondary | ICD-10-CM

## 2015-12-06 MED ORDER — IPRATROPIUM-ALBUTEROL 0.5-2.5 (3) MG/3ML IN SOLN: 3.0000 mL | RESPIRATORY_TRACT | Status: DC | PRN

## 2015-12-06 NOTE — Progress Notes (Signed)
[place] PHYSICAL MEDICINE & REHABILITATION     PROGRESS NOTE    Subjective/Complaints:  Patient seen this morning sitting up on the side of his bed. He states that he is waiting for coffee, and this is his biggest problem at this point. He also mentions that he wants to do much better on his balance testing that his goal for today.  ROS: Denies CP, SOB, N/V/D  Objective: Vital Signs: Blood pressure 135/74, pulse 93, temperature 98.1 F (36.7 C), temperature source Oral, resp. rate 19, height 5\' 7"  (1.702 m), weight 106 kg (233 lb 11 oz), SpO2 94 %. No results found. No results for input(s): WBC, HGB, HCT, PLT in the last 72 hours. No results for input(s): NA, K, CL, GLUCOSE, BUN, CREATININE, CALCIUM in the last 72 hours.  Invalid input(s): CO CBG (last 3)  No results for input(s): GLUCAP in the last 72 hours.  Wt Readings from Last 3 Encounters:  12/03/15 106 kg (233 lb 11 oz)  11/30/15 102.8 kg (226 lb 10.1 oz)  09/21/15 112.492 kg (248 lb)    Physical Exam:  BP 135/74 mmHg  Pulse 93  Temp(Src) 98.1 F (36.7 C) (Oral)  Resp 19  Ht 5\' 7"  (1.702 m)  Wt 106 kg (233 lb 11 oz)  BMI 36.59 kg/m2  SpO2 94% Constitutional:He appears well-developed and well-nourished. NAD. Obese male  HENT: Normocephalic and atraumatic.  Eyes: Conjunctivae and EOM are normal.  Cardiovascular: Regular rhythm and rate.  Respiratory: Effort normal and breath sounds normal. No respiratory distress. He has no wheezes.  GI: Soft. Bowel sounds are normal.  Musculoskeletal: He exhibits no edema and no tenderness  Neurological: He is alert and oriented.  Speech clear.  Follows basic commands without difficulty. Makes good eye contact with examiner.  Has poor insight/awareness of deficits (improving) Motor: 4+/5 proximal to distal throughout  Skin: Skin is warm and dry. No rash noted. No erythema.  Scattered abrasions  Psychiatric: Affect and behavior normal. Impulsive  Assessment/Plan: 1.  Functional deficits secondary to debilitation after viral PNA which require 3+ hours per day of interdisciplinary therapy in a comprehensive inpatient rehab setting. Physiatrist is providing close team supervision and 24 hour management of active medical problems listed below. Physiatrist and rehab team continue to assess barriers to discharge/monitor patient progress toward functional and medical goals.  Function:  Bathing Bathing position Bathing activity did not occur: Refused Position: Sitting EOB  Bathing parts Body parts bathed by patient: Left arm, Chest, Abdomen, Right upper leg, Left upper leg Body parts bathed by helper: Back, Left lower leg, Right lower leg, Buttocks  Bathing assist Assist Level: Touching or steadying assistance(Pt > 75%)      Upper Body Dressing/Undressing Upper body dressing   What is the patient wearing?: Pull over shirt/dress     Pull over shirt/dress - Perfomed by patient: Thread/unthread right sleeve, Thread/unthread left sleeve, Put head through opening, Pull shirt over trunk          Upper body assist Assist Level: Supervision or verbal cues      Lower Body Dressing/Undressing Lower body dressing   What is the patient wearing?: Underwear, Pants, Shoes Underwear - Performed by patient: Thread/unthread right underwear leg, Thread/unthread left underwear leg, Pull underwear up/down   Pants- Performed by patient: Thread/unthread right pants leg, Thread/unthread left pants leg, Pull pants up/down, Fasten/unfasten pants   Non-skid slipper socks- Performed by patient: Don/doff right sock, Don/doff left sock Non-skid slipper socks- Performed by helper: Don/doff  left sock, Don/doff right sock       Shoes - Performed by helper: Don/doff right shoe, Don/doff left shoe, Fasten right, Fasten left          Lower body assist Assist for lower body dressing: Touching or steadying assistance (Pt > 75%)      Toileting Toileting Toileting activity did  not occur: Safety/medical concerns Toileting steps completed by patient: Adjust clothing prior to toileting, Performs perineal hygiene, Adjust clothing after toileting   Toileting Assistive Devices: Grab bar or rail  Toileting assist Assist level: More than reasonable time   Transfers Chair/bed transfer   Chair/bed transfer method: Stand pivot, Ambulatory Chair/bed transfer assist level: Touching or steadying assistance (Pt > 75%) Chair/bed transfer assistive device: Medical sales representative     Max distance: 175' Assist level: Touching or steadying assistance (Pt > 75%)   Wheelchair   Type: Manual Max wheelchair distance: 150 Assist Level: Touching or steadying assistance (Pt > 75%)  Cognition Comprehension Comprehension assist level: Follows basic conversation/direction with extra time/assistive device  Expression Expression assist level: Expresses basic needs/ideas: With extra time/assistive device  Social Interaction Social Interaction assist level: Interacts appropriately 90% of the time - Needs monitoring or encouragement for participation or interaction.  Problem Solving Problem solving assist level: Solves basic 90% of the time/requires cueing < 10% of the time  Memory Memory assist level: Recognizes or recalls 75 - 89% of the time/requires cueing 10 - 24% of the time    Medical Problem List and Plan: 1. Weakness confusion balance deficits secondary to debilitation after viral PNA  Continue CIR 2. DVT Prophylaxis/Anticoagulation: Mechanical: Sequential compression devices, below knee Bilateral lower extremities Pharmaceutical: Lovenox 3. Chronic Pain Management: hydrocodone resumed today. Monitor for oversedation and/ or hypercarbia.   4. Mood: LCSW to follow for evaluation and support.  5. Neuropsych: This patient is capable of making decisions on his own behalf at this time. 6. Skin/Wound Care: routine pressure relief measures. Maintain adequate  nutritional and hydration status.  7. Fluids/Electrolytes/Nutrition: Monitor I/O.   BMP within acceptable range on 12/24, with the exception of mild hypokalemia. Will continue to monitor.   Will order labs for tomorrow 8. COPD with AECOPD: Leucocytosis likely due to steroids -- last dose on 12/24  Patient afebrile  Cont to monitor for signs of infection.   Continue mucomyst and duonebs.    Will order labs for tomorrow 9. Acute on chronic diastolic CHF: Compensated and bumex d/c due to hypotension. Changed hydralazine to po and continue cardizem bid 10. CAP: Continue Levaquin thorough 12/25 to complete antibiotic course. 11. Pre-renal azotemia: Resolving--to continue bumex bid.  12. Delirium: resolved and [place] drug on hold.  11. Hypotension: Improved  12. GERD: Intolerant of protonix. Changed to Nexium.  13. Tachycardia: Likely secondary to deconditioning.  Improved today  will continue to monitor   LOS (Days) 5 A FACE TO FACE EVALUATION WAS PERFORMED  Thedore Pickel Lorie Phenix 12/06/2015 8:33 AM

## 2015-12-06 NOTE — Progress Notes (Signed)
Occupational Therapy Session Note  Patient Details  Name: George Leon MRN: [id number] Date of Birth: 01-16-1956  Today's Date: 12/06/2015 OT Individual Time: [id number] and 1315-1415 OT Individual Time Calculation (min): 75 min and 60 min    Short Term Goals: Week 1:  OT Short Term Goal 1 (Week 1): Pt. will bathe self with min assist OT Short Term Goal 2 (Week 1): Pt will dress UB with SBA OT Short Term Goal 3 (Week 1): Pt will dress LB with min assist OT Short Term Goal 4 (Week 1): Pt will transfer to toilet with min assist OT Short Term Goal 5 (Week 1): Pt will transfer to shower with mod assist  Skilled Therapeutic Interventions/Progress Updates:  Session 1:Upon entering the room, pt seated on EOB awaiting therapist. Pt with 7/10 c/o lower back pain from previous session but still agreeable to OT intervention this session. Pt ambulated 250' with RW and close supervision to elevators to go down to gift shop. Pt taking seated rest break secondary to fatigue. Pt ambulating in gift shop on carpeted surface reaching upward, laterally, and squatting to obtain items on bottom shelf with close supervision and use of RW. Pt did need to take 1 seated rest break in wheelchair and he locked wheelchair breaks without cues showing good safety awareness. Pt returning to room in same manner with multiple rest breaks this time secondary to fatigue. Pt refused to allow therapist to push him in wheelchair for energy conservation as he reports it makes his back hurt. Pt seated on EOB, with lunch tray and call bell within reach upon exiting the room.   Session 2: Upon entering the room, pt supine in bed with 8/10 c/o back pain. Pt reports pain in chronic in nature. Pt declines OOB tasks and agreeable to B UE strengthening with theraband. OT educated and demonstrated pt on use of orange, level 2 resistive band for B UE strengthening. Pt returned demonstrations for 2 sets of 15 for bicep curls, shoulder  diagonals, shoulder flexion, lateral pull downs , and chest pulls. Pt required rest breaks secondary to fatigue. Pt ambulating with RW and close supervision to bathroom for BM. Pt performed LB clothing management and hygiene with supervision for safety. Pt needing min verbal cues for safety awareness to keep RW close during trasnfers. Pt returned to bed at end of session. Bed alarm activated and call bell within reach upon exiting the room.   Therapy Documentation Precautions:  Precautions Precautions: Fall Precaution Comments: monitor 02 sats; flexiseal (has COPD) Restrictions Weight Bearing Restrictions: No General:   Vital Signs: Therapy Vitals BP: 128/64 mmHg  See Function Navigator for Current Functional Status.   Therapy/Group: Individual Therapy  [name] 12/06/2015, 12:34 PM

## 2015-12-06 NOTE — Patient Care Conference (Signed)
Inpatient RehabilitationTeam Conference and Plan of Care Update Date: 12/05/2015   Time: 11:35 AM    Patient Name: George Leon      Medical Record Number: [id number]  Date of Birth: [birth year] Sex: Male         Room/Bed: 4W13C/4W13C-01 Payor Info: Payor: MEDICAID POTENTIAL / Plan: MEDICAID POTENTIAL / Product Type: *No Product type* /    Admitting Diagnosis: PCCM Debility PNA  Admit Date/Time:  12/01/2015  4:41 PM Admission Comments: No comment available   Primary Diagnosis:  <principal problem not specified> Principal Problem: <principal problem not specified>  Patient Active Problem List   Diagnosis Date Noted  . Tachycardia   . Physical debility 12/01/2015  . Chronic pain syndrome 12/01/2015  . Acute on chronic diastolic heart failure ([place]) 12/01/2015  . Prerenal azotemia 12/01/2015  . Labile blood pressure 12/01/2015  . Tobacco abuse   . Coronary artery disease involving native coronary artery of native heart without angina pectoris   . Endotracheal tube present   . Acute respiratory failure ([place])   . CAP (community acquired pneumonia)   . Encounter for imaging study to confirm orogastric (OG) tube placement   . Respiratory failure ([place]) 11/25/2015  . Acute respiratory failure with hypercapnia ([place])   . Olecranon bursitis of right elbow 09/21/2015  . Chronic venous insufficiency 06/19/2015  . Diastolic CHF ([place]) [id number]  . Proteinuria 05/22/2015  . Macular degeneration, 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  . Bilateral lower extremity edema 05/11/2015  . Issue of medical certificate for disability examination 04/21/2015  . Osteoarthritis of left knee 12/28/2014  . Osteoarthritis of right knee 12/28/2014  . Peripheral vascular disease ([place]) 08/23/2013  . CAD (coronary artery disease) 08/23/2013  . Sciatica of left side 03/30/2013  . Disorder of SI (sacroiliac) joint 03/30/2013  . GANGLION CYST, WRIST, LEFT 11/16/2010  . COPD (chronic obstructive pulmonary disease) ([place])  10/26/2010  . Anxiety state 05/24/2009  . Osteoarthritis of multiple joints 08/25/2008  . OBESITY, MORBID 05/26/2008  . GERD 06/19/2007  . TOBACCO ABUSE 04/07/2007  . Encounter for chronic pain management 04/07/2007  . HYPERCHOLESTEROLEMIA 02/16/2007  . HYPERTENSION, BENIGN 02/16/2007    Expected Discharge Date: Expected Discharge Date: 12/09/15  Team Members Present: Physician leading conference: Dr. Delice Lesch Social Worker Present: Lennart Pall, LCSW Nurse Present: Dorien Chihuahua, RN PT Present: Raylene Everts, PT OT Present: Benay Pillow, OT SLP Present: Weston Anna, SLP PPS Coordinator present : Daiva Nakayama, RN, CRRN     Current Status/Progress Goal Weekly Team Focus  Medical   Weakness and balance deficits secondary to debilitation after viral PNA  Improve strength, gait  see above   Bowel/Bladder   Pt continent of bowel and bladder. LBM 12-27  Mod I  contiune POC with b/b   Swallow/Nutrition/ Hydration             ADL's   supervision - min A overall for self care, functional mobility with supervision/steady assist, verbal cues needed for safety and use of RW  Mod I - supervision  self care retraining, dynamic standing balance, pt/family edu, safety   Mobility             Communication   Min A   Min A, goals will be upgraded to supervision   complex problem solving, recall, safety    Safety/Cognition/ Behavioral Observations            Pain   chronic back pain. 2 norco prn q6hrs.   3 or less  assess pain  and medicate as needed.   Skin   scattered bruising.   free of skin breakdown Mod I  assess skin q shift    Rehab Goals Patient on target to meet rehab goals: Yes *See Care Plan and progress notes for long and short-term goals.  Barriers to Discharge: Tachycardia, pain    Possible Resolutions to Barriers:  Optimize pain meds, aggressive therapies to improve endurance    Discharge Planning/Teaching Needs:  Home with family able to provide 24/7  assistance/ supervision      Team Discussion:  Currently supervision/ min assist level;  BERG today with 80% risk of fall.  Supervision goals overall but hope to upgrade toileting to mod ind. Pt impulsive and set in his ways.  ST feels pt likely at baseline with cognition.  May have a couple of hours that he would be alone at home - team feels this is ok if he stays fairly sedentary.  Revisions to Treatment Plan:  None   Continued Need for Acute Rehabilitation Level of Care: The patient requires daily medical management by a physician with specialized training in physical medicine and rehabilitation for the following conditions: Daily direction of a multidisciplinary physical rehabilitation program to ensure safe treatment while eliciting the highest outcome that is of practical value to the patient.: Yes Daily medical management of patient stability for increased activity during participation in an intensive rehabilitation regime.: Yes Daily analysis of laboratory values and/or radiology reports with any subsequent need for medication adjustment of medical intervention for : Pulmonary problems  George Leon 12/06/2015, 1:50 PM

## 2015-12-06 NOTE — Progress Notes (Signed)
Physical Therapy Session Note  Patient Details  Name: George Leon MRN: [id number] Date of Birth: 1956/09/06  Today's Date: 12/06/2015 PT Individual Time: 0900-1000 PT Individual Time Calculation (min): 60 min   Short Term Goals: Week 1:  PT Short Term Goal 1 (Week 1): Pt will perform bed mobility mod I PT Short Term Goal 2 (Week 1): Pt will perform stand pivot transfer min guard PT Short Term Goal 3 (Week 1): Pt will perform gait with LRAD x150' with min guard PT Short Term Goal 4 (Week 1): Pt will perform ascent/descent of 4 -6inch stairs with minA  Skilled Therapeutic Interventions/Progress Updates:   Pt seated EOB requesting to don jeans prior to starting therapy.  Pt performed lower body dressing and donning of socks and shoes in sitting and standing transitioning multiple times sit <> stand with close supervision and extra time due to fatigue and SOB.  Pt more fatigued this am and reporting swollen lymph node, pain on R side of neck-RN notified and vitals assessed 131/68, HR: 98, Sp02 95% on RA.  Pt performed transfer to toilet with RW and supervision and urinated in standing with supervision.  Transported pt outside in w/c for energy conservation.  Performed gait downhill on sidewalk with RW and min A for safety and balance + verbal cues to attend to front wheels catching on sidewalk cracks and posture during downhill.  At bottom of hill pt SOB with Sp02 80%; pt returned to w/c to rest but unable to bring Sp02 >90%.  Pt transported back inside and to rehab unit in w/c with pt performing intermittent w/c propulsion with LE to tolerance.  Once back in side pt Sp02 increased to 98%.  Discussed with pt PT recommendations of continued use of RW for endurance as well as w/c for community and transport car <> apartment for safety and energy conservation; pt states his wife would not be able to load/unload a w/c but his son may be able to but feels he will not need a w/c; he continues to state  that he will be able to ambulate car <> apartment with [facility].  Continued to provide pt with awareness of deficits and safety risks and PT recommendations.  Will discuss with social worker and family.  Demonstrated to pt safe sequencing for transfers to/from car with RW; pt gave repeat demonstration with supervision.  Returned to room in w/c and transferred back to EOB with supervision.  Pt left with bed alarm on and all items within reach and RN notified for pain medicine due to pt reporting back pain.     Therapy Documentation Precautions:  Precautions Precautions: Fall Precaution Comments: monitor 02 sats; flexiseal (has COPD) Restrictions Weight Bearing Restrictions: No Vital Signs: Therapy Vitals BP: 128/64 mmHg Pain: Pain Assessment Pain Assessment: 0-10 Pain Score: 10-Worst pain ever Pain Type: Chronic pain Pain Location: Back Pain Orientation: Lower Pain Descriptors / Indicators: Aching Pain Frequency: Constant Pain Onset: On-going Pain Intervention(s): Medication (See eMAR)  See Function Navigator for Current Functional Status.   Therapy/Group: Individual Therapy  Raylene Everts [facility] 12/06/2015, 12:40 PM

## 2015-12-06 NOTE — Progress Notes (Signed)
Social Work Patient ID: George Leon, male   DOB: [birth year], 59 y.o.   MRN: [id number]   Have reviewed team conference with pt and wife.  Both aware and agreeable with targeted d/c for 12/09/15 and supervision/ mod i goals.  Wife does confirm that, once son returns to work on 1/3, there may be a couple of hours mid afternoon that pt is alone at home.  She denies any concerns at this time, except does request an update from MD/PA on latest CXR - Dan Angiulli, PA aware and is following up with her.  Will continue to follow.  [name]

## 2015-12-07 ENCOUNTER — Inpatient Hospital Stay (HOSPITAL_COMMUNITY): Payer: Medicaid Other | Admitting: Speech Pathology

## 2015-12-07 ENCOUNTER — Inpatient Hospital Stay (HOSPITAL_COMMUNITY): Payer: Medicaid Other | Admitting: Occupational Therapy

## 2015-12-07 ENCOUNTER — Inpatient Hospital Stay (HOSPITAL_COMMUNITY): Payer: Medicaid Other

## 2015-12-07 DIAGNOSIS — E876 Hypokalemia: Secondary | ICD-10-CM | POA: Insufficient documentation

## 2015-12-07 LAB — CBC WITH DIFFERENTIAL/PLATELET
BASOS ABS: 0 10*3/uL (ref 0.0–0.1)
BASOS PCT: 0 %
Eosinophils Absolute: 0.1 10*3/uL (ref 0.0–0.7)
Eosinophils Relative: 1 %
HEMATOCRIT: 40.1 % (ref 39.0–52.0)
HEMOGLOBIN: 12.5 g/dL — AB (ref 13.0–17.0)
Lymphocytes Relative: 28 %
Lymphs Abs: 3.9 10*3/uL (ref 0.7–4.0)
MCH: 29.6 pg (ref 26.0–34.0)
MCHC: 31.2 g/dL (ref 30.0–36.0)
MCV: 94.8 fL (ref 78.0–100.0)
MONOS PCT: 9 %
Monocytes Absolute: 1.3 10*3/uL — ABNORMAL HIGH (ref 0.1–1.0)
NEUTROS ABS: 8.6 10*3/uL — AB (ref 1.7–7.7)
NEUTROS PCT: 62 %
Platelets: 255 10*3/uL (ref 150–400)
RBC: 4.23 MIL/uL (ref 4.22–5.81)
RDW: 14.3 % (ref 11.5–15.5)
WBC: 13.9 10*3/uL — ABNORMAL HIGH (ref 4.0–10.5)

## 2015-12-07 LAB — BASIC METABOLIC PANEL
ANION GAP: 7 (ref 5–15)
CHLORIDE: 104 mmol/L (ref 101–111)
CO2: 28 mmol/L (ref 22–32)
Calcium: 8.7 mg/dL — ABNORMAL LOW (ref 8.9–10.3)
Creatinine, Ser: 0.57 mg/dL — ABNORMAL LOW (ref 0.61–1.24)
GFR calc non Af Amer: >60 mL/min (ref >60)
Glucose, Bld: 96 mg/dL (ref 65–99)
Potassium: 3.4 mmol/L — ABNORMAL LOW (ref 3.5–5.1)
Sodium: 139 mmol/L (ref 135–145)

## 2015-12-07 MED ORDER — POTASSIUM CHLORIDE CRYS ER 20 MEQ PO TBCR
40.0000 meq | EXTENDED_RELEASE_TABLET | Freq: Once | ORAL | Status: AC
  Administered 2015-12-07: 40 meq via ORAL
  Filled 2015-12-07: qty 2

## 2015-12-07 NOTE — Progress Notes (Signed)
Social Work Patient ID: George Leon, male   DOB: 25-Jun-1956, 59 y.o.   MRN: [id number]  Spoke with pt and wife today about follow up recommendations of txs.  Explained that [facility] therapy was recommended, however, he does not have a qualifying diagnosis for Medicaid coverage of these services.  They are not financial able to pay privately and the pt feels he "will be fine."  I will refer for [facility] which is covered.  DME recommendations are w/c, walker, tub bench and 3n1 commode.  Pt and wife agreeable to all except the w/c.  They do not feel they will need this nor do they think if will work with the path they have to take to get to their apartment.    Nashla Althoff, LCSW

## 2015-12-07 NOTE — Progress Notes (Signed)
Occupational Therapy Session Note  Patient Details  Name: George Leon MRN: [id number] Date of Birth: 1956-02-26  Today's Date: 12/07/2015 OT Individual Time: 1100-1145 and 1515-1600 OT Individual Time Calculation (min): 45 min and 45 min    Short Term Goals: Week 1:  OT Short Term Goal 1 (Week 1): Pt. will bathe self with min assist OT Short Term Goal 2 (Week 1): Pt will dress UB with SBA OT Short Term Goal 3 (Week 1): Pt will dress LB with min assist OT Short Term Goal 4 (Week 1): Pt will transfer to toilet with min assist OT Short Term Goal 5 (Week 1): Pt will transfer to shower with mod assist  Skilled Therapeutic Interventions/Progress Updates:  Session 1: Upon entering the room, pt seated on EOB with 10/10 c/o chronic back pain. Pt reports recently getting pain medication prior to session. Pt very disagreeable to OT intervention this session. OT providing education regarding OT purpose, POC, and progress towards goals with discharge planning. Pt reports feeling confused this morning.He calls wife and reports wrong schedule with wife getting upset and therapist having to inform her of correct schedule via phone. Pt declined bathing and dressing this session. He ambulated 100' with small bag of laundry to washing machine. He simulated placing items in, putting in dryer, and folding at dryer with close supervision. Pt dropping some items on floor and doing deep squat to pick up with min A for balance. Pt returned to bedroom in same manner and seated on EOB with bed alarm activated and call bell within reach upon exiting the room.   Session 2: Upon entering the room, pt seated in EOB and reports feeling much better. Pt states, "okay, let do this (referencing to shower)." Pt ambulates with RW to obtain all needed items with supervision. Pt performs shower transfer with supervision and pt engaged in bathing while seated on TTB with supervision. Pt standing once to rinse soap while holding  grab bar with supervision. Pt requiring min verbal cues for safety with LB dressing with supervision while seated on EOB. Pt requiring rest break secondary to fatigue at end of session.   Therapy Documentation Precautions:  Precautions Precautions: Fall Precaution Comments: monitor 02 sats; flexiseal (has COPD) Restrictions Weight Bearing Restrictions: No Vital Signs: Therapy Vitals BP: 128/66 mmHg Pain: Pain Assessment Pain Score: 5  (in therapy session)  See Function Navigator for Current Functional Status.   Therapy/Group: Individual Therapy  [name] 12/07/2015, 12:31 PM

## 2015-12-07 NOTE — Progress Notes (Signed)
[place] PHYSICAL MEDICINE & REHABILITATION     PROGRESS NOTE    Subjective/Complaints:  Patient seen this morning sitting up on the edge of his bed. He did not sleep well last night due to intermittent back pain.  ROS: Denies CP, SOB, N/V/D  Objective: Vital Signs: Blood pressure 120/78, pulse 100, temperature 98.2 F (36.8 C), temperature source Oral, resp. rate 19, height 5\' 7"  (1.702 m), weight 106.2 kg (234 lb 2.1 oz), SpO2 96 %. Dg Chest 2 View  12/06/2015  CLINICAL DATA:  CHF.  History of pneumonia. EXAM: CHEST  2 VIEW COMPARISON:  12/01/2015 FINDINGS: Linear areas of scarring in the lingula. Right lung is clear. Heart is normal size. No confluent opacities or effusions. No acute bony abnormality. IMPRESSION: No active cardiopulmonary disease. Electronically Signed   By: Rolm Baptise M.D.   On: 12/06/2015 16:38    Recent Labs  12/07/15 0423  WBC 13.9*  HGB 12.5*  HCT 40.1  PLT 255    Recent Labs  12/07/15 0423  NA 139  K 3.4*  CL 104  GLUCOSE 96  BUN <5*  CREATININE 0.57*  CALCIUM 8.7*   CBG (last 3)  No results for input(s): GLUCAP in the last 72 hours.  Wt Readings from Last 3 Encounters:  12/07/15 106.2 kg (234 lb 2.1 oz)  11/30/15 102.8 kg (226 lb 10.1 oz)  09/21/15 112.492 kg (248 lb)    Physical Exam:  BP 120/78 mmHg  Pulse 100  Temp(Src) 98.2 F (36.8 C) (Oral)  Resp 19  Ht 5\' 7"  (1.702 m)  Wt 106.2 kg (234 lb 2.1 oz)  BMI 36.66 kg/m2  SpO2 96% Constitutional:He appears well-developed and well-nourished. NAD. Obese male  HENT: Normocephalic and atraumatic.  Eyes: Conjunctivae and EOM are normal.  Cardiovascular: Regular rhythm and rate.  Respiratory: Effort normal and breath sounds normal. No respiratory distress. He has no wheezes.  GI: Soft. Bowel sounds are normal.  Musculoskeletal: He exhibits no edema and no tenderness  Neurological: He is alert and oriented.  Speech clear.  Follows basic commands without difficulty. Makes good  eye contact with examiner.  Has poor insight/awareness of deficits (improving) Motor: 4+-5/5 proximal to distal throughout  Skin: Skin is warm and dry. No rash noted. No erythema.  Scattered abrasions  Psychiatric: Affect and behavior normal. Impulsive  Assessment/Plan: 1. Functional deficits secondary to debilitation after viral PNA which require 3+ hours per day of interdisciplinary therapy in a comprehensive inpatient rehab setting. Physiatrist is providing close team supervision and 24 hour management of active medical problems listed below. Physiatrist and rehab team continue to assess barriers to discharge/monitor patient progress toward functional and medical goals.  Function:  Bathing Bathing position Bathing activity did not occur: Refused Position: Sitting EOB  Bathing parts Body parts bathed by patient: Left arm, Chest, Abdomen, Right upper leg, Left upper leg Body parts bathed by helper: Back, Left lower leg, Right lower leg, Buttocks  Bathing assist Assist Level: Touching or steadying assistance(Pt > 75%)      Upper Body Dressing/Undressing Upper body dressing   What is the patient wearing?: Pull over shirt/dress     Pull over shirt/dress - Perfomed by patient: Thread/unthread right sleeve, Thread/unthread left sleeve, Put head through opening, Pull shirt over trunk          Upper body assist Assist Level: Supervision or verbal cues      Lower Body Dressing/Undressing Lower body dressing   What is the patient wearing?: Underwear,  Pants, Shoes Underwear - Performed by patient: Thread/unthread right underwear leg, Thread/unthread left underwear leg, Pull underwear up/down   Pants- Performed by patient: Thread/unthread right pants leg, Thread/unthread left pants leg, Pull pants up/down, Fasten/unfasten pants   Non-skid slipper socks- Performed by patient: Don/doff right sock, Don/doff left sock Non-skid slipper socks- Performed by helper: Don/doff left sock,  Don/doff right sock       Shoes - Performed by helper: Don/doff right shoe, Don/doff left shoe, Fasten right, Fasten left          Lower body assist Assist for lower body dressing: Touching or steadying assistance (Pt > 75%)      Toileting Toileting Toileting activity did not occur: Safety/medical concerns Toileting steps completed by patient: Adjust clothing prior to toileting, Performs perineal hygiene, Adjust clothing after toileting   Toileting Assistive Devices: Grab bar or rail  Toileting assist Assist level: Supervision or verbal cues   Transfers Chair/bed transfer   Chair/bed transfer method: Stand pivot, Ambulatory Chair/bed transfer assist level: Supervision or verbal cues Chair/bed transfer assistive device: Medical sales representative     Max distance: 100 Assist level: Touching or steadying assistance (Pt > 75%) (outside)   Wheelchair   Type: Manual Max wheelchair distance: 50 Assist Level: Moderate assistance (Pt 50 - 74%) (uphill outside)  Cognition Comprehension Comprehension assist level: Follows basic conversation/direction with extra time/assistive device  Expression Expression assist level: Expresses basic needs/ideas: With extra time/assistive device  Social Interaction Social Interaction assist level: Interacts appropriately 90% of the time - Needs monitoring or encouragement for participation or interaction.  Problem Solving Problem solving assist level: Solves basic 90% of the time/requires cueing < 10% of the time  Memory Memory assist level: Recognizes or recalls 75 - 89% of the time/requires cueing 10 - 24% of the time    Medical Problem List and Plan: 1. Weakness confusion balance deficits secondary to debilitation after viral PNA  Continue CIR  CXR reviewed from 12/28 2. DVT Prophylaxis/Anticoagulation: Mechanical: Sequential compression devices, below knee Bilateral lower extremities Pharmaceutical: Lovenox 3. Chronic Pain  Management: hydrocodone resumed today. Monitor for oversedation and/ or hypercarbia.   4. Mood: LCSW to follow for evaluation and support.  5. Neuropsych: This patient is capable of making decisions on his own behalf at this time. 6. Skin/Wound Care: routine pressure relief measures. Maintain adequate nutritional and hydration status.  7. Fluids/Electrolytes/Nutrition: Monitor I/O.   K+ 3.4 on 12/29. Will replete 8. COPD with AECOPD: Leucocytosis likely due to steroids -- last dose on 12/24  Patient afebrile  Cont to monitor for signs of infection.   Continue mucomyst and duonebs.   Improved on 12/29 9. Acute on chronic diastolic CHF: Compensated and bumex d/c due to hypotension. Changed hydralazine to po and continue cardizem bid 10. CAP: Continue Levaquin thorough 12/25 to complete antibiotic course. 11. Pre-renal azotemia: Resolving--to continue bumex bid.  12. Delirium: resolved and [place] drug on hold.  11. Hypotension: Improved  12. GERD: Intolerant of protonix. Changed to Nexium.  13. Tachycardia: Likely secondary to deconditioning.  Improved today  will continue to monitor  14. Anemia  Hb 12.5 on 12/29  LOS (Days) 6 A FACE TO FACE EVALUATION WAS PERFORMED  George Leon 12/07/2015 8:43 AM

## 2015-12-07 NOTE — Progress Notes (Signed)
Speech Language Pathology Daily Session Note  Patient Details  Name: George Leon MRN: [id number] Date of Birth: 1956-06-09  Today's Date: 12/07/2015 SLP Individual Time: 1000-1055 SLP Individual Time Calculation (min): 55 min  Short Term Goals: Week 1: SLP Short Term Goal 1 (Week 1): Patient will utilize memory compensatory strategies to facilitate recall of new, functional information with Min A verbal and question cues.  SLP Short Term Goal 2 (Week 1): Pt will demonstrate functional  problem solving with complex tasks with Min A verbal cues.   Skilled Therapeutic Interventions: Skilled treatment session focused on cognitive goals. Patient administered the MoCA and scored 17/22 points with a score of 18 or above considered normal. Patient demonstrated deficits in the areas of short-term recall, however, both the patient and his wife report this is baseline. Patient also participated in a functional conversation with focus on anticipatory awareness. Patient required supervision verbal cues to verbalize tasks/activities that are appropriate for him to participate in at home. Patient left sitting EOB with all needs within reach and bed alarm on. Continue with current plan of care.     Function:   Cognition Comprehension Comprehension assist level: Follows basic conversation/direction with extra time/assistive device  Expression   Expression assist level: Expresses basic needs/ideas: With extra time/assistive device  Social Interaction Social Interaction assist level: Interacts appropriately 90% of the time - Needs monitoring or encouragement for participation or interaction.  Problem Solving Problem solving assist level: Solves basic 90% of the time/requires cueing < 10% of the time  Memory Memory assist level: Recognizes or recalls 90% of the time/requires cueing < 10% of the time    Pain Pain Assessment Pain Assessment: No/denies pain   Therapy/Group: Individual  Therapy  George Leon 12/07/2015, 4:51 PM

## 2015-12-07 NOTE — Progress Notes (Signed)
Physical Therapy Session Note  Patient Details  Name: George Leon MRN: [id number] Date of Birth: 11/12/1956  Today's Date: 12/07/2015 PT Individual Time: [id number] PT Individual Time Calculation (min): 45 min   Short Term Goals: Week 1:  PT Short Term Goal 1 (Week 1): Pt will perform bed mobility mod I PT Short Term Goal 2 (Week 1): Pt will perform stand pivot transfer min guard PT Short Term Goal 3 (Week 1): Pt will perform gait with LRAD x150' with min guard PT Short Term Goal 4 (Week 1): Pt will perform ascent/descent of 4 -6inch stairs with minA  Skilled Therapeutic Interventions/Progress Updates:    session focused on functional gait with RW with cues for safety with RW and positioning of RW during turns, Introduced HEP for [employer] exercises to address strength and balance for HEP, practiced curb step negotiation with RW though pt reports that now he does not think he has a step to get into the apartment like he did before and education on energy conservation.  Pt performed 10 reps for BLE including LAQ with 5 second hold, standing hip abduction, standing hamstring curls, mini squats, and standing heel/toe raises. Handout given for patient to take home. Pt reports he likely will do other exercises and that he "has other friends" who can help him come up with a program.    Therapy Documentation Precautions:  Precautions Precautions: Fall Precaution Comments: monitor 02 sats; flexiseal (has COPD) Restrictions Weight Bearing Restrictions: No   Vital Signs: O2 sats at 96% on room air during mobility despite pt with increased effort of breathing and c/o SOB Pain:   See Function Navigator for Current Functional Status.   Therapy/Group: Individual Therapy  Canary Brim Ivory Broad, PT, DPT  12/07/2015, 3:39 PM

## 2015-12-08 ENCOUNTER — Inpatient Hospital Stay (HOSPITAL_COMMUNITY): Payer: Self-pay

## 2015-12-08 ENCOUNTER — Inpatient Hospital Stay (HOSPITAL_COMMUNITY): Payer: Medicaid Other | Admitting: Occupational Therapy

## 2015-12-08 ENCOUNTER — Inpatient Hospital Stay (HOSPITAL_COMMUNITY): Payer: Self-pay | Admitting: Physical Therapy

## 2015-12-08 MED ORDER — TRAZODONE HCL 50 MG PO TABS: 25.0000 mg | ORAL_TABLET | Freq: Every evening | ORAL | Status: DC | PRN

## 2015-12-08 MED ORDER — HYDRALAZINE HCL 25 MG PO TABS: 25.0000 mg | ORAL_TABLET | Freq: Three times a day (TID) | ORAL | Status: DC

## 2015-12-08 MED ORDER — DILTIAZEM HCL 30 MG PO TABS
30.0000 mg | ORAL_TABLET | Freq: Two times a day (BID) | ORAL | Status: DC
Start: 2015-12-08 — End: 2016-01-19

## 2015-12-08 MED ORDER — POTASSIUM CHLORIDE CRYS ER 20 MEQ PO TBCR: 20.0000 meq | EXTENDED_RELEASE_TABLET | Freq: Two times a day (BID) | ORAL | Status: DC

## 2015-12-08 NOTE — Progress Notes (Signed)
Physical Therapy Discharge Summary  Patient Details  Name: George Leon MRN: [id number] Date of Birth: [birth year]  Today's Date: 12/08/2015 PT Individual Time: 9323-5573 PT Individual Time Calculation (min): 54 min    Patient has met 9 of 10 long term goals due to improved activity tolerance, improved balance, increased strength and improved attention.  Patient to discharge at an ambulatory level Supervision.   Patient's care partner, wife and son, were recommended to attend therapy sessions to perform family education and hands on training.  Family was present yesterday but did not participate in PT sessions.   Recommending 24/7 supervision for pt due to impaired balance and safety awareness during mobility; recommendations have been communicated to family.  Reasons goals not met: Pt did not meet community gait goal of supervision 150' + uphill/downhill with RW.  Pt required min A for safe outdoor ambulation with RW x 50-100' secondary to impaired endurance and oxygen support; pt 02 desaturated to 80% after ambulating downhill and required w/c transport back uphill and inside.  Recommending that pt use manual w/c in community and be transported car <> apartment in manual w/c by son.  Family aware of recommendations.  Recommendation:  Patient does not qualify for follow up or ongoing skilled PT services but has been provided with handouts on OTAGO HEP to continue to advance safe functional mobility, address ongoing impairments in impaired activity tolerance/endurance, impaired strength, balance, gait, and minimize fall risk.  Equipment: RW and manual w/c for community use  Reasons for discharge: treatment goals met and discharge from hospital  Patient/family agrees with progress made and goals achieved: Yes  PT Discharge Pt received in recliner; pt reporting increased fatigue today and edema in LE; pt reports he has been keeping his LE elevated throughout the day.  Pt reporting pain in  back; RN notified.  Pt performed transfer to<>from toilet with RW and supervision secondary to continued verbal cues required for safe gait pattern and sequencing with RW.  Pt required supervision for toileting tasks.  Pt participated in graduation day tasks including gait with RW in controlled environment, one step and stair negotiation, car transfer stand pivot with RW with supervision, and w/c mobility as documented below.  Pt required multiple sitting rest breaks due to fatigue and SOB but Sp02 remained >95% on room air and HR: 105 bpm.  Pt returned to room and left in recliner with all items within reach.   Precautions/Restrictions Precautions Precautions: Fall Restrictions Weight Bearing Restrictions: No Vital Signs Therapy Vitals Temp: 97.9 F (36.6 C) Temp Source: Oral Pulse Rate: 100 Resp: 18 BP: 138/77 mmHg Patient Position (if appropriate): Sitting Oxygen Therapy SpO2: 96 % O2 Device: Not Delivered Pain Pain Assessment Pain Assessment: 0-10 Pain Score: 9  Pain Type: Chronic pain Pain Location: Knee Pain Orientation: Right Pain Descriptors / Indicators: Aching Pain Onset: Gradual Patients Stated Pain Goal: 4 Pain Intervention(s): Rest Multiple Pain Sites: No Cognition Overall Cognitive Status: History of cognitive impairments - at baseline Arousal/Alertness: Awake/alert Orientation Level: Oriented X4 Sustained Attention: Appears intact Memory: Impaired (at baseline) Awareness: Impaired Awareness Impairment: Anticipatory impairment Problem Solving: Appears intact Safety/Judgment: Impaired Sensation Sensation Light Touch: Appears Intact Stereognosis: Not tested Hot/Cold: Not tested Proprioception: Not tested Coordination Gross Motor Movements are Fluid and Coordinated: Not tested Fine Motor Movements are Fluid and Coordinated: Not tested Motor  Motor Motor - Discharge Observations: generalized weakness although has made improvements since evaluation   Mobility Bed Mobility Bed Mobility: Rolling Right;Rolling Left;Supine to Sit;Sit to  Supine Rolling Right: 6: Modified independent (Device/Increase time) Rolling Left: 6: Modified independent (Device/Increase time) Supine to Sit: 6: Modified independent (Device/Increase time) Sit to Supine: 6: Modified independent (Device/Increase time) Transfers Sit to Stand: 6: Modified independent (Device/Increase time) Stand to Sit: 6: Modified independent (Device/Increase time) Stand Pivot Transfers: 6: Modified independent (Device/Increase time) Locomotion  Ambulation Ambulation/Gait Assistance: 5: Supervision Ambulation Distance (Feet): 150 Feet Assistive device: Rolling walker Ambulation/Gait Assistance Details: Verbal cues for gait pattern;Verbal cues for precautions/safety;Tactile cues for posture;Verbal cues for technique Gait; requires cues to maintain upright posture and decreased WB through UE on RW, cues to maintain safe distance to RW, for foot clearance bilaterally, full step length/advancement of LLE, weight shifting and safe navigation with RW.  Also requires cues to lift RW up and over floor transitions to prevent RW wheels from catching and pt LOB forwards.   Gait Pattern: Impaired Gait Pattern: Decreased trunk rotation;Poor foot clearance - right;Poor foot clearance - left;Shuffle;Trunk flexed;Decreased weight shift to right;Decreased step length - right;Decreased step length - left;Decreased stride length Stairs / Additional Locomotion Stairs: Yes Stairs Assistance: 5: Supervision Stair Management Technique: Two rails;Alternating pattern;Forwards Number of Stairs: 12 Height of Stairs: 6 Product manager Assistance: 5: Careers information officer: Both upper extremities Wheelchair Parts Management: Needs assistance Distance: 150  Trunk/Postural Assessment  Cervical Assessment Cervical Assessment: Within Functional Limits Thoracic Assessment Thoracic  Assessment: Exceptions to [facility] (flexion, and lateral flexion due to chronic back pain) Lumbar Assessment Lumbar Assessment: Exceptions to WFL (decreased ROM for rotation and tilting) Postural Control Postural Control: Deficits on evaluation (bias towards forwards flexion and LOB)  Balance Balance Balance Assessed: Yes Standardized Balance Assessment Standardized Balance Assessment: Berg Balance Test Berg Balance Test Sit to Stand: Able to stand  independently using hands Standing Unsupported: Able to stand safely 2 minutes Sitting with Back Unsupported but Feet Supported on Floor or Stool: Able to sit safely and securely 2 minutes Stand to Sit: Controls descent by using hands Transfers: Able to transfer safely, minor use of hands Standing Unsupported with Eyes Closed: Able to stand 10 seconds with supervision Standing Ubsupported with Feet Together: Able to place feet together independently and stand for 1 minute with supervision From Standing, Reach Forward with Outstretched Arm: Can reach confidently >25 cm (10") From Standing Position, Pick up Object from Floor: Able to pick up shoe, needs supervision From Standing Position, Turn to Look Behind Over each Shoulder: Looks behind from both sides and weight shifts well Turn 360 Degrees: Needs close supervision or verbal cueing Standing Unsupported, Alternately Place Feet on Step/Stool: Able to stand independently and complete 8 steps >20 seconds Standing Unsupported, One Foot in Front: Able to plae foot ahead of the other independently and hold 30 seconds Standing on One Leg: Tries to lift leg/unable to hold 3 seconds but remains standing independently Total Score: 43 Static Sitting Balance Static Sitting - Balance Support: Feet supported Static Sitting - Level of Assistance: 6: Modified independent (Device/Increase time) Dynamic Sitting Balance Dynamic Sitting - Balance Support: Feet supported;Right upper extremity supported;Left upper  extremity supported Dynamic Sitting - Level of Assistance: 6: Modified independent (Device/Increase time) Static Standing Balance Static Standing - Balance Support: Left upper extremity supported;Right upper extremity supported Static Standing - Level of Assistance: 6: Modified independent (Device/Increase time) Dynamic Standing Balance Dynamic Standing - Balance Support: Right upper extremity supported;Left upper extremity supported Dynamic Standing - Level of Assistance: 5: Stand by assistance Dynamic Standing - Balance Activities: Reaching for objects;Lateral lean/weight shifting  Extremity Assessment  RUE Assessment RUE Assessment: Within Functional Limits LUE Assessment LUE Assessment: Within Functional Limits RLE Assessment RLE Assessment: Exceptions to [facility] (5/5 except 3/5 hip flexion) LLE Assessment LLE Assessment: Exceptions to [facility] (4/5 except 2/5 hip flexion)   See Function Navigator for Current Functional Status.  Raylene Everts [place] 12/08/2015, 4:55 PM

## 2015-12-08 NOTE — Discharge Instructions (Signed)
Inpatient Rehab Discharge Instructions  George Leon Discharge date and time: 12/08/15   Activities/Precautions/ Functional Status: Activity: activity as tolerated Diet: regular diet Low salt Wound Care: none needed   Functional status:  ___ No restrictions     ___ Walk up steps independently ___ 24/7 supervision/assistance   ___ Walk up steps with assistance _X__ Intermittent supervision/assistance  ___ Bathe/dress independently ___ Walk with walker     __X_ Bathe/dress with assistance ___ Walk Independently    ___ Shower independently ___ Walk with assistance    ___ Shower with assistance _X__ No alcohol     ___ Return to work/school ________   COMMUNITY REFERRALS UPON DISCHARGE:    Home Health:     RN                       Agency:  [facility] Phone: [phone]   Medical Equipment/Items Ordered:  Wheelchair, walker, 3n1 commode and tub bench                                                      Agency/Supplier: [facility] @ [phone]        Special Instructions: 1. Note changes in blood pressure medications.     My questions have been answered and I understand these instructions. I will adhere to these goals and the provided educational materials after my discharge from the hospital.  Patient/Caregiver Signature _______________________________ Date __________  Clinician Signature _______________________________________ Date __________  Please bring this form and your medication list with you to all your follow-up doctor's appointments.

## 2015-12-08 NOTE — Progress Notes (Signed)
Social Work  Discharge Note  The overall goal for the admission was met for:   Discharge location: Yes - home with family able to provide up to 24/7 assistance  Length of Stay: Yes - 8 days  Discharge activity level: Yes - supervision/ modified independent  Home/community participation: Yes  Services provided included: MD, RD, PT, OT, SLP, RN, TR, Pharmacy and SW  Financial Services: Medicaid  Follow-up services arranged: Home Health: RN via [facility], DME: 18x18 lightweight w/c, cushion, rolling walker, 3n1 commode and tub bench via [facility] and Patient/Family has no preference for HH/DME agencies  Comments (or additional information):  Patient/Family verbalized understanding of follow-up arrangements: Yes  Individual responsible for coordination of the follow-up plan: pt  Confirmed correct DME delivered: George Leon, George Leon 12/08/2015    George Leon, George Leon 

## 2015-12-08 NOTE — Progress Notes (Signed)
Speech Language Pathology Discharge Summary  Patient Details  Name: George Leon MRN: [id number] Date of Birth: 17-Dec-1955   Patient has met 2 of 2 long term goals.  Patient to discharge at overall Modified Independent;Supervision level.   Reasons goals not met: N/A   Clinical Impression/Discharge Summary: Patient has made functional gains and has met 2 of 2 LTG's this admission due to improved cognitive function. Currently, patient requires Mod I-intermittent supervision to complete functional and familiar tasks safely in regards to attention, functional problem solving, recall and overall safety.  Patient education is complete and both the patient and his wife report the patient is at his cognitive baseline, therefore, f/u SLP services are not warranted at this time.   Care Partner:  Caregiver Able to Provide Assistance: Yes     Recommendation:  None      Equipment: N/A  Reasons for discharge: Treatment goals met;Discharged from hospital   Patient/Family Agrees with Progress Made and Goals Achieved: Yes     [place], [place] 12/08/2015, 7:09 AM

## 2015-12-08 NOTE — Progress Notes (Signed)
Occupational Therapy Discharge Summary  Patient Details  Name: George Leon MRN: [id number] Date of Birth: 05-03-1956  Today's Date: 12/08/2015 OT Individual Time: 0930-1030 OT Individual Time Calculation (min): 60 min  15 missed minutes  Patient has met 15 of 15 long term goals due to improved activity tolerance, improved balance, postural control, ability to compensate for deficits, improved attention, improved awareness and improved coordination.  Patient to discharge at overall Supervision level.  Pt's family did not attend therapy sessions for education.   Reasons goals not met: all goals met  Recommendation:  Patient will benefit from ongoing skilled OT services in home health setting to continue to advance functional skills in the area of BADL and iADL.  Equipment: recommended BSC and tub transfer bench  Reasons for discharge: treatment goals met  Patient/family agrees with progress made and goals achieved: Yes   OT Intervention: Upon entering the room, pt seated on EOB with 9/10 c/o lower back pain this session. Pt required coaxing for participation in  OT intervention this session. Pt dressed self from EOB prior to therapist arrival with supervision per pt report. Pt ambulated short distance to sink and standing for grooming tasks with mod I . OT educating pt on recommendations for at home in order to increase safety and decrease fall risk. OT discussing supervision when up ambulating as well as staying within RW for safety. Pt transferred into recliner chair as pt reports increase edema in R foot. OT removed shoe with minimal edema noted and elevated R LE as well as performed retrograde massage to decrease swelling. Pt remained in recliner chair and declined further OT intervention this session. Call bell and all needed items within reach upon exiting the room.   OT Discharge Precautions/Restrictions  Precautions Precautions: Fall Restrictions Weight Bearing  Restrictions: No General OT Amount of Missed Time: 15 Minutes  Pain Pain Assessment Pain Assessment: 0-10 Pain Score: 9  Pain Type: Chronic pain Pain Location: Back Pain Orientation: Lower Pain Descriptors / Indicators: Aching Pain Onset: On-going Patients Stated Pain Goal: 4 Pain Intervention(s): Repositioned;Emotional support Multiple Pain Sites: No Vision/Perception  Vision- History Baseline Vision/History: Wears glasses Wears Glasses: Reading only Patient Visual Report: No change from baseline Vision- Assessment Vision Assessment?: No apparent visual deficits  Cognition Overall Cognitive Status: History of cognitive impairments - at baseline Arousal/Alertness: Awake/alert Orientation Level: Oriented X4 Sustained Attention: Appears intact Memory: Impaired (at baseline) Awareness: Impaired Awareness Impairment: Anticipatory impairment Problem Solving: Appears intact Safety/Judgment: Impaired Sensation Sensation Light Touch: Appears Intact Coordination Gross Motor Movements are Fluid and Coordinated: Yes Fine Motor Movements are Fluid and Coordinated: Yes Motor  Motor Motor - Discharge Observations: generalized weakness although has made improvements since evaluation Mobility  Transfers Transfers: Sit to Stand;Stand to Sit Sit to Stand: 5: Supervision Stand to Sit: 5: Supervision  Trunk/Postural Assessment  Cervical Assessment Cervical Assessment: Within Functional Limits Thoracic Assessment Thoracic Assessment: Exceptions to [facility] Lumbar Assessment Lumbar Assessment: Exceptions to [facility] Postural Control Postural Control: Deficits on evaluation  Balance Balance Balance Assessed: Yes Static Sitting Balance Static Sitting - Balance Support: Feet supported Static Sitting - Level of Assistance: 6: Modified independent (Device/Increase time) Dynamic Sitting Balance Dynamic Sitting - Balance Support: Feet supported Dynamic Sitting - Level of Assistance: 6:  Modified independent (Device/Increase time) Static Standing Balance Static Standing - Balance Support: Bilateral upper extremity supported;During functional activity Static Standing - Level of Assistance: 5: Stand by assistance Dynamic Standing Balance Dynamic Standing - Balance Support: During functional activity;Left upper  extremity supported Dynamic Standing - Level of Assistance: 5: Stand by assistance Dynamic Standing - Balance Activities: Reaching for objects;Lateral lean/weight shifting Extremity/Trunk Assessment RUE Assessment RUE Assessment: Within Functional Limits LUE Assessment LUE Assessment: Within Functional Limits   See Function Navigator for Current Functional Status.  [name] 12/08/2015, 1:19 PM

## 2015-12-08 NOTE — Progress Notes (Signed)
[place] PHYSICAL MEDICINE & REHABILITATION     PROGRESS NOTE   Subjective/Complaints:  Pt seen this AM sitting up on the edge of his bed.  He states he slept very well after receiving a sleeping pill last night. He is looking forward to being discharged tomorrow.    ROS: Denies CP, SOB, N/V/D  Objective: Vital Signs: Blood pressure 129/85, pulse 105, temperature 98.9 F (37.2 C), temperature source Oral, resp. rate 19, height 5\' 7"  (1.702 m), weight 110.2 kg (242 lb 15.2 oz), SpO2 93 %. Dg Chest 2 View  12/06/2015  CLINICAL DATA:  CHF.  History of pneumonia. EXAM: CHEST  2 VIEW COMPARISON:  12/01/2015 FINDINGS: Linear areas of scarring in the lingula. Right lung is clear. Heart is normal size. No confluent opacities or effusions. No acute bony abnormality. IMPRESSION: No active cardiopulmonary disease. Electronically Signed   By: Rolm Baptise M.D.   On: 12/06/2015 16:38    Recent Labs  12/07/15 0423  WBC 13.9*  HGB 12.5*  HCT 40.1  PLT 255    Recent Labs  12/07/15 0423  NA 139  K 3.4*  CL 104  GLUCOSE 96  BUN <5*  CREATININE 0.57*  CALCIUM 8.7*   CBG (last 3)  No results for input(s): GLUCAP in the last 72 hours.  Wt Readings from Last 3 Encounters:  12/08/15 110.2 kg (242 lb 15.2 oz)  11/30/15 102.8 kg (226 lb 10.1 oz)  09/21/15 112.492 kg (248 lb)    Physical Exam:  BP 129/85 mmHg  Pulse 105  Temp(Src) 98.9 F (37.2 C) (Oral)  Resp 19  Ht 5\' 7"  (1.702 m)  Wt 110.2 kg (242 lb 15.2 oz)  BMI 38.04 kg/m2  SpO2 93% Constitutional:He appears well-developed and well-nourished. NAD. Obese male  HENT: Normocephalic and atraumatic.  Eyes: Conjunctivae and EOM are normal.  Cardiovascular: Regular rhythm and rate.  Respiratory: Effort normal and breath sounds normal. No respiratory distress. He has no wheezes.  GI: Soft. Bowel sounds are normal.  Musculoskeletal: He exhibits no edema and no tenderness  Neurological: He is alert and oriented.  Speech clear.   Follows basic commands without difficulty. Makes good eye contact with examiner.  Has poor insight/awareness of deficits (improving) Motor: 5/5 proximal to distal throughout  Skin: Skin is warm and dry. No rash noted. No erythema.  Psychiatric: Affect and behavior normal. Impulsive  Assessment/Plan: 1. Functional deficits secondary to debilitation after viral PNA which require 3+ hours per day of interdisciplinary therapy in a comprehensive inpatient rehab setting. Physiatrist is providing close team supervision and 24 hour management of active medical problems listed below. Physiatrist and rehab team continue to assess barriers to discharge/monitor patient progress toward functional and medical goals.  Function:  Bathing Bathing position Bathing activity did not occur: Refused Position: Production manager parts bathed by patient: Right arm, Left upper leg, Left arm, Right lower leg, Chest, Abdomen, Left lower leg, Front perineal area, Buttocks, Right upper leg Body parts bathed by helper: Back, Left lower leg, Right lower leg, Buttocks  Bathing assist Assist Level: Supervision or verbal cues      Upper Body Dressing/Undressing Upper body dressing   What is the patient wearing?: Pull over shirt/dress     Pull over shirt/dress - Perfomed by patient: Thread/unthread right sleeve, Thread/unthread left sleeve, Put head through opening, Pull shirt over trunk          Upper body assist Assist Level: More than reasonable time  Lower Body Dressing/Undressing Lower body dressing   What is the patient wearing?: Underwear, Pants, Non-skid slipper socks Underwear - Performed by patient: Thread/unthread right underwear leg, Thread/unthread left underwear leg, Pull underwear up/down   Pants- Performed by patient: Thread/unthread right pants leg, Thread/unthread left pants leg, Pull pants up/down, Fasten/unfasten pants   Non-skid slipper socks- Performed by patient: Don/doff  right sock, Don/doff left sock Non-skid slipper socks- Performed by helper: Don/doff left sock, Don/doff right sock       Shoes - Performed by helper: Don/doff right shoe, Don/doff left shoe, Fasten right, Fasten left          Lower body assist Assist for lower body dressing: Supervision or verbal cues      Toileting Toileting Toileting activity did not occur: Safety/medical concerns Toileting steps completed by patient: Adjust clothing prior to toileting, Adjust clothing after toileting, Performs perineal hygiene   Toileting Assistive Devices: Grab bar or rail  Toileting assist Assist level: Supervision or verbal cues   Transfers Chair/bed transfer   Chair/bed transfer method: Ambulatory Chair/bed transfer assist level: Supervision or verbal cues Chair/bed transfer assistive device: Medical sales representative     Max distance: 150 Assist level: Supervision or verbal cues   Wheelchair   Type: Manual Max wheelchair distance: 50 Assist Level: Moderate assistance (Pt 50 - 74%) (uphill outside)  Cognition Comprehension Comprehension assist level: Follows basic conversation/direction with extra time/assistive device  Expression Expression assist level: Expresses basic needs/ideas: With extra time/assistive device  Social Interaction Social Interaction assist level: Interacts appropriately 90% of the time - Needs monitoring or encouragement for participation or interaction.  Problem Solving Problem solving assist level: Solves basic 90% of the time/requires cueing < 10% of the time  Memory Memory assist level: Recognizes or recalls 90% of the time/requires cueing < 10% of the time    Medical Problem List and Plan: 1. Weakness confusion balance deficits secondary to debilitation after viral PNA  Continue CIR  CXR reviewed from 12/28 2. DVT Prophylaxis/Anticoagulation: Mechanical: Sequential compression devices, below knee Bilateral lower extremities Pharmaceutical:  Lovenox 3. Chronic Pain Management: hydrocodone resumed today. Monitor for oversedation and/ or hypercarbia.   4. Mood: LCSW to follow for evaluation and support.  5. Neuropsych: This patient is capable of making decisions on his own behalf at this time. 6. Skin/Wound Care: routine pressure relief measures. Maintain adequate nutritional and hydration status.  7. Fluids/Electrolytes/Nutrition: Monitor I/O.   K+ 3.4 on 12/29. Repleted 8. COPD with AECOPD: Leucocytosis likely due to steroids -- last dose on 12/24  Patient afebrile  Cont to monitor for signs of infection.   Continue mucomyst and duonebs.   Improved on 12/29 9. Acute on chronic diastolic CHF: Compensated and bumex d/c due to hypotension. Changed hydralazine to po and continue cardizem bid 10. CAP: Continue Levaquin thorough 12/25 to complete antibiotic course. 11. Pre-renal azotemia: Resolving--to continue bumex bid.  12. Delirium: resolved and M[place] drug on hold.  11. Hypotension: Improved  12. GERD: Intolerant of protonix. Changed to Nexium.  13. Tachycardia: Likely secondary to deconditioning.  Will continue to monitor  14. Anemia  Hb 12.5 on 12/29  LOS (Days) 7 A FACE TO FACE EVALUATION WAS PERFORMED  George Leon Lorie Phenix 12/08/2015 8:24 AM

## 2015-12-08 NOTE — Discharge Summary (Signed)
Physician Discharge Summary  Patient ID: George Leon MRN: [id number] DOB/AGE: 58-Jun-1957 58 y.o.  Admit date: 12/01/2015 Discharge date: 12/08/2016  Discharge Diagnoses:  Principal Problem:   Physical debility Active Problems:   HYPERCHOLESTEROLEMIA   OBESITY, MORBID   Anxiety state   Encounter for chronic pain management   HYPERTENSION, BENIGN   COPD (chronic obstructive pulmonary disease) (HCC)   GERD   Osteoarthritis of multiple joints   CAD (coronary artery disease)   Diastolic CHF ([place])   CAP (community acquired pneumonia)   Coronary artery disease involving native coronary artery of native heart without angina pectoris   Chronic pain syndrome   Acute on chronic diastolic heart failure (HCC)   Prerenal azotemia   Labile blood pressure   Tachycardia   Hypokalemia   Discharged Condition: stable.   Significant Diagnostic Studies: Dg Chest 2 View  12/06/2015  CLINICAL DATA:  CHF.  History of pneumonia. EXAM: CHEST  2 VIEW COMPARISON:  12/01/2015 FINDINGS: Linear areas of scarring in the lingula. Right lung is clear. Heart is normal size. No confluent opacities or effusions. No acute bony abnormality. IMPRESSION: No active cardiopulmonary disease. Electronically Signed   By: Rolm Baptise M.D.   On: 12/06/2015 16:38   Dg Chest Port 1 View  12/01/2015  CLINICAL DATA:  Respiratory failure, community-acquired pneumonia, coronary artery disease, current smoker. EXAM: PORTABLE CHEST 1 VIEW COMPARISON:  Portable chest x-ray of November 30, 2015 FINDINGS: The trachea and esophagus have been extubated. The lungs are adequately inflated. The interstitial markings are more conspicuous bilaterally. There is persistent subsegmental atelectasis in the left lower lung. The heart remains mildly enlarged. The pulmonary vascularity is prominent centrally. The mediastinum is normal in width. There is no pleural effusion or pneumothorax. IMPRESSION: Mild increased prominence of the pulmonary  interstitium may reflect low-grade interstitial edema likely secondary to CHF. Improving subsegmental atelectasis in the left mid to lower lung. Electronically Signed   By: David  Martinique M.D.   On: 12/01/2015 07:50   Labs:  Basic Metabolic Panel: BMP Latest Ref Rng 12/07/2015 12/02/2015 12/01/2015  Glucose 65 - 99 mg/dL 96 98 118(H)  BUN 6 - 20 mg/dL <5(L) 27(H) 28(H)  Creatinine 0.61 - 1.24 mg/dL 0.57(L) 0.63 0.71  Sodium 135 - 145 mmol/L 139 138 136  Potassium 3.5 - 5.1 mmol/L 3.4(L) 3.4(L) 4.0  Chloride 101 - 111 mmol/L 104 101 97(L)  CO2 22 - 32 mmol/L 28 28 28   Calcium 8.9 - 10.3 mg/dL 8.7(L) 8.4(L) 8.6(L)     CBC: CBC Latest Ref Rng 12/07/2015 12/02/2015 12/01/2015  WBC 4.0 - 10.5 K/uL 13.9(H) 21.4(H) 34.0(H)  Hemoglobin 13.0 - 17.0 g/dL 12.5(L) 14.5 14.6  Hematocrit 39.0 - 52.0 % 40.1 44.5 46.2  Platelets 150 - 400 K/uL 255 214 267     CBG: No results for input(s): GLUCAP in the last 168 hours.  Brief HPI:   George Leon is a 59 year old smoker with history of CAD, HTN, chronic pain, COPD who was admitted on 11/25/15 with non-productive cough, fevers, BLE edema and lethargy. He was noted to be in significant respiratory distress with hypercarbia and required ventilator support for AEDOPD and suspected PNA/Viral URI. He was treated with IV antibiotics, chest PT and steroids. He was diuresed with Bumex and hydralazine due to concerns of acute on chronic CHF. He was placed on [place]IND[place] drug to help manage delirium and tolerated extubation on 11/30/15. Narcotics were held due to oversedation and [place] drugh placed on hold  due to delirium. He developed hypotension prior to admission therefore therefore Bumex was d/c and he was bolused with IVF. CIR was recommended due to debility.     Hospital Course: George Leon was admitted to rehab 12/01/2015 for inpatient therapies to consist of PT, ST and OT at least three hours five days a week. Past admission physiatrist, therapy  team and rehab RN have worked together to provide customized collaborative inpatient rehab.  Blood pressures were monitored on bid basis and have shown good control with resolution of hypotension.  Weights were checked daily with monitoring for signs of overload. CHF is compensated and weight was 239 lbs at discharge.  He completed antibiotic course on 12/25 and respiratory status has improved. CXR on 12/29 shows clearing of R lung and resolution of interstitial edema.   Follow up labs showed mild hypokalemia was was briefly supplemented and acute renal failure has resolved. CBC done showed improvement in leucocytosis with treated of CAP and H/H is at baseline.   He continued to have issues with chronic back pain and has required hydrocodone on prn basis. He has had improvement in mentatin and endurance. He continues to have poor attention to safety awareness with impulsivity and family reported that patient was at baseline. He has made good progress and is currently at supervision to modified independent level.  He will continue to receive follow up [facility] by [facility] for [facility] reinforcement. HHPT not covered by Medicaid and family educated on HEP.    Rehab course: During patient's stay in rehab weekly team conferences were held to monitor patient's progress, set goals and discuss barriers to discharge. At admission, patient required moderate assistance with mobility and self care tasks. He had mild deficits in higher level problems solving and working memory.  He has had improvement in activity tolerance, balance, postural control, as well as ability to compensate for deficits. He is able to complete ADL tasks with supervision and requires close supervision for ambulating 250' with RW.  He is able to complete functional and familiar tasks with intermittent supervision. Family education was done in regards to safety and need for supervision.    Disposition: 06-Home-Health Care Svc  Diet:  Regular. Low salt.   Special Instructions: 1. Needs BMET rechecked in a week. 2. Note changes in BP medications.       Medication List    STOP taking these medications        amLODipine 10 MG tablet  Commonly known as:  NORVASC     aspirin EC 81 MG tablet     diazepam 5 MG tablet  Commonly known as:  VALIUM     diltiazem 180 MG 24 hr capsule  Commonly known as:  TAZTIA XT     losartan 50 MG tablet  Commonly known as:  COZAAR     metoprolol 200 MG 24 hr tablet  Commonly known as:  TOPROL XL     montelukast 4 MG chewable tablet  Commonly known as:  SINGULAIR     rosuvastatin 10 MG tablet  Commonly known as:  CRESTOR      TAKE these medications        albuterol 108 (90 Base) MCG/ACT inhaler  Commonly known as:  PROAIR HFA  Inhale 1-2 puffs into the lungs every 4 (four) hours as needed. As needed for shortness of breath /wheeze/cough     atorvastatin 80 MG tablet  Commonly known as:  LIPITOR  Take 1 tablet (80 mg total)  by mouth daily.     budesonide-formoterol 160-4.5 MCG/ACT inhaler  Commonly known as:  SYMBICORT  Inhale 2 puffs into the lungs 2 (two) times daily.     dextromethorphan 30 MG/5ML liquid  Commonly known as:  DELSYM  Take 30 mg by mouth as needed for cough.     diltiazem 30 MG tablet  Commonly known as:  CARDIZEM  Take 1 tablet (30 mg total) by mouth every 12 (twelve) hours.     esomeprazole 40 MG capsule  Commonly known as:  NEXIUM  Take 1 capsule (40 mg total) by mouth daily as needed. indigestion     fish oil-omega-3 fatty acids 1000 MG capsule  Take 1 g by mouth 2 (two) times daily.     hydrALAZINE 25 MG tablet  Commonly known as:  APRESOLINE  Take 1 tablet (25 mg total) by mouth every 8 (eight) hours.     HYDROcodone-acetaminophen 5-325 MG tablet  Commonly known as:  NORCO  Take 2 tablets by mouth every 4 (four) hours as needed for moderate pain.     Medical Compression Stockings Misc  1 each by Does not apply route once.      OCUVITE ADULT FORMULA Caps  Take 1 capsule by mouth daily.     traZODone 50 MG tablet  Commonly known as:  DESYREL  Take 0.5-1 tablets (25-50 mg total) by mouth at bedtime as needed for sleep.        Follow-up Information    Follow up with Vance Gather, MD On 12/15/2015.   Specialty:  Family Medicine   Why:  Be there at 9:45 for post hospital follow up. Needs BMET to check potassium levels.    Contact information:   [place] [address] [phone]       Call [name], MD.   Specialty:  Physical Medicine and Rehabilitation   Why:  As needed   Contact information:   [place] [id number] [phone]       Signed: Bary Leriche 12/12/2015, 12:59 PM

## 2015-12-08 NOTE — Progress Notes (Signed)
Occupational Therapy Session Note  Patient Details  Name: George Leon MRN: [id number] Date of Birth: Nov 21, 1956  Today's Date: 12/08/2015 OT Individual Time: 1300-1350 OT Individual Time Calculation (min): 50 min   Short Term Goals: Week 1:  OT Short Term Goal 1 (Week 1): Pt. will bathe self with min assist OT Short Term Goal 2 (Week 1): Pt will dress UB with SBA OT Short Term Goal 3 (Week 1): Pt will dress LB with min assist OT Short Term Goal 4 (Week 1): Pt will transfer to toilet with min assist OT Short Term Goal 5 (Week 1): Pt will transfer to shower with mod assist  Skilled Therapeutic Interventions/Progress Updates: Therapeutic activity with focus on dynamic standing balance, endurance, functional mobility, and discharge planning.   Pt received seated in recliner with c/o BLE edema.  Pt donned shoes and ambulated from room to kitchen using RW with min guard for safety.   Pt with mild SOB requiring rest at loveseat 5 min, HR assessed 116 bpm, recovered to 102 in 5 min.   Pt proceeds to kitchen and is educated on energy conservation, using swivel stool while completing typical food prep at home.   Pt verbalized understanding.   Pt then directed to retreive and replaces items in cabinet to demo dynamic standing balance.   Pt observed with LOB 1 time d/t right knee buckling.   Pt reports chronic OA and multiple falls at home.   Pt returns to loveseat to rest again, HR 106, 02 sats 95%.   After rest pt demo's ability to perform fall recovery from floor and rests again briefly before requesting to return to his room via RW.  Pt recovers to recliner and reports exacerbation of knee pain with need for rest from activity.  RN tech alerted to request for meds at end of session.        Therapy Documentation Precautions:  Precautions Precautions: Fall Precaution Comments: monitor 02 sats; flexiseal (has COPD) Restrictions Weight Bearing Restrictions: No   General: General OT Amount of  Missed Time: 10 Minutes   Pain: Pain Assessment Pain Assessment: 0-10 Pain Score: 9  Pain Type: Chronic pain Pain Location: Knee Pain Orientation: Right Pain Descriptors / Indicators: Aching Pain Onset: Gradual Patients Stated Pain Goal: 4 Pain Intervention(s): Rest Multiple Pain Sites: No   See Function Navigator for Current Functional Status.   Therapy/Group: Individual Therapy  Francena Zender 12/08/2015, 1:52 PM

## 2015-12-09 NOTE — Progress Notes (Signed)
12/09/15 1035 nursing Patient discharged to home per wheelchair accompanied by NT and wife . Home Health equipments delivered and brought home. Discharged instructions given to patient with no further questions.

## 2015-12-09 NOTE — Progress Notes (Signed)
[facility]     PROGRESS NOTE   Subjective/Complaints:  Pt seen this AM sleeping in bed.  He had a good night and is looking forward to going home today, but has some suggestions regarding the equipment and the need to update it.    ROS: Denies CP, SOB, N/V/D  Objective: Vital Signs: Blood pressure 118/71, pulse 90, temperature 98.2 F (36.8 C), temperature source Oral, resp. rate 18, height 5\' 7"  (1.702 m), weight 108.6 kg (239 lb 6.7 oz), SpO2 94 %. No results found.  Recent Labs  12/07/15 0423  WBC 13.9*  HGB 12.5*  HCT 40.1  PLT 255    Recent Labs  12/07/15 0423  NA 139  K 3.4*  CL 104  GLUCOSE 96  BUN <5*  CREATININE 0.57*  CALCIUM 8.7*   CBG (last 3)  No results for input(s): GLUCAP in the last 72 hours.  Wt Readings from Last 3 Encounters:  12/09/15 108.6 kg (239 lb 6.7 oz)  11/30/15 102.8 kg (226 lb 10.1 oz)  09/21/15 112.492 kg (248 lb)    Physical Exam:  BP 118/71 mmHg  Pulse 90  Temp(Src) 98.2 F (36.8 C) (Oral)  Resp 18  Ht 5\' 7"  (1.702 m)  Wt 108.6 kg (239 lb 6.7 oz)  BMI 37.49 kg/m2  SpO2 94% Constitutional:He appears well-developed and well-nourished. NAD. Obese male  HENT: Normocephalic and atraumatic.  Eyes: Conjunctivae and EOM are normal.  Cardiovascular: Regular rhythm and rate.  Respiratory: Effort normal and breath sounds normal. No respiratory distress. He has no wheezes.  GI: Soft. Bowel sounds are normal.  Musculoskeletal: He exhibits no edema and no tenderness  Neurological: He is alert and oriented.  Speech clear.  Follows basic commands without difficulty. Makes good eye contact with examiner.  Has poor insight/awareness of deficits (improving) Motor: 5/5 proximal to distal throughout  Skin: Skin is warm and dry. No rash noted. No erythema.  Psychiatric: Affect and behavior normal. Impulsive (improved)  Assessment/Plan: 1. Functional deficits secondary to debilitation after viral PNA  which require 3+ hours per day of interdisciplinary therapy in a comprehensive inpatient rehab setting. Physiatrist is providing close team supervision and 24 hour management of active medical problems listed below. Physiatrist and rehab team continue to assess barriers to discharge/monitor patient progress toward functional and medical goals.  Function:  Bathing Bathing position Bathing activity did not occur: Refused Position: Production manager parts bathed by patient: Right arm, Left upper leg, Left arm, Right lower leg, Chest, Abdomen, Left lower leg, Front perineal area, Buttocks, Right upper leg Body parts bathed by helper: Back, Left lower leg, Right lower leg, Buttocks  Bathing assist Assist Level: Supervision or verbal cues      Upper Body Dressing/Undressing Upper body dressing Upper body dressing/undressing activity did not occur:  (per pt report) What is the patient wearing?: Pull over shirt/dress     Pull over shirt/dress - Perfomed by patient: Thread/unthread right sleeve, Thread/unthread left sleeve, Put head through opening, Pull shirt over trunk          Upper body assist Assist Level: More than reasonable time      Lower Body Dressing/Undressing Lower body dressing   What is the patient wearing?: Underwear, Pants, Socks, Shoes Underwear - Performed by patient: Thread/unthread right underwear leg, Thread/unthread left underwear leg, Pull underwear up/down   Pants- Performed by patient: Thread/unthread right pants leg, Thread/unthread left pants leg, Pull pants up/down, Fasten/unfasten pants  Non-skid slipper socks- Performed by patient: Don/doff right sock, Don/doff left sock Non-skid slipper socks- Performed by helper: Don/doff left sock, Don/doff right sock     Shoes - Performed by patient: Don/doff right shoe, Don/doff left shoe, Fasten right, Fasten left Shoes - Performed by helper: Don/doff right shoe, Don/doff left shoe, Fasten right, Fasten  left          Lower body assist Assist for lower body dressing: Supervision or verbal cues (per pt report)      Toileting Toileting Toileting activity did not occur: Safety/medical concerns Toileting steps completed by patient: Adjust clothing prior to toileting, Adjust clothing after toileting, Performs perineal hygiene   Toileting Assistive Devices: Grab bar or rail  Toileting assist Assist level: More than reasonable time   Transfers Chair/bed transfer   Chair/bed transfer method: Ambulatory Chair/bed transfer assist level: No Help, no cues, assistive device, takes more than a reasonable amount of time Chair/bed transfer assistive device: Medical sales representative     Max distance: 150 Assist level: Supervision or verbal cues   Wheelchair   Type: Manual Max wheelchair distance: 150 Assist Level: Supervision or verbal cues  Cognition Comprehension Comprehension assist level: Follows basic conversation/direction with extra time/assistive device  Expression Expression assist level: Expresses basic needs/ideas: With extra time/assistive device  Social Interaction Social Interaction assist level: Interacts appropriately 90% of the time - Needs monitoring or encouragement for participation or interaction.  Problem Solving Problem solving assist level: Solves basic 90% of the time/requires cueing < 10% of the time  Memory Memory assist level: Recognizes or recalls 90% of the time/requires cueing < 10% of the time    Medical Problem List and Plan: 1. Weakness confusion balance deficits secondary to debilitation after viral PNA  D/c today  CXR reviewed from 12/28 2. DVT Prophylaxis/Anticoagulation: Mechanical: Sequential compression devices, below knee Bilateral lower extremities Pharmaceutical: Lovenox 3. Chronic Pain Management: hydrocodone resumed today. Monitor for oversedation and/ or hypercarbia.   4. Mood: LCSW to follow for evaluation and support.  5.  Neuropsych: This patient is capable of making decisions on his own behalf at this time. 6. Skin/Wound Care: routine pressure relief measures. Maintain adequate nutritional and hydration status.  7. Fluids/Electrolytes/Nutrition: Monitor I/O.   K+ 3.4 on 12/29. Repleted 8. COPD with AECOPD: Leucocytosis likely due to steroids -- last dose on 12/24  Patient afebrile  Cont to monitor for signs of infection.   Continue mucomyst and duonebs.   Improved on 12/29 9. Acute on chronic diastolic CHF: Compensated and bumex d/c due to hypotension. Changed hydralazine to po and continue cardizem bid 10. CAP: Continue Levaquin thorough 12/25 to complete antibiotic course. 11. Pre-renal azotemia: Resolving--to continue bumex bid.  12. Delirium: resolved and [place] drug on hold.  11. Hypotension: Improved  12. GERD: Intolerant of protonix. Changed to Nexium.  13. Tachycardia: Likely secondary to deconditioning.  Improved today 14. Anemia  Hb 12.5 on 12/29  LOS (Days) 8 A FACE TO FACE EVALUATION WAS PERFORMED  [name] 12/09/2015 8:02 AM

## 2015-12-15 ENCOUNTER — Ambulatory Visit (INDEPENDENT_AMBULATORY_CARE_PROVIDER_SITE_OTHER): Payer: Medicaid Other | Admitting: Family Medicine

## 2015-12-15 ENCOUNTER — Encounter: Payer: Self-pay | Admitting: Family Medicine

## 2015-12-15 VITALS — BP 126/86 | HR 87 | Temp 98.0°F | Wt 237.2 lb

## 2015-12-15 DIAGNOSIS — E876 Hypokalemia: Secondary | ICD-10-CM | POA: Diagnosis not present

## 2015-12-15 DIAGNOSIS — I872 Venous insufficiency (chronic) (peripheral): Secondary | ICD-10-CM

## 2015-12-15 DIAGNOSIS — J449 Chronic obstructive pulmonary disease, unspecified: Secondary | ICD-10-CM

## 2015-12-15 DIAGNOSIS — B379 Candidiasis, unspecified: Secondary | ICD-10-CM | POA: Diagnosis not present

## 2015-12-15 DIAGNOSIS — I10 Essential (primary) hypertension: Secondary | ICD-10-CM

## 2015-12-15 DIAGNOSIS — F17200 Nicotine dependence, unspecified, uncomplicated: Secondary | ICD-10-CM | POA: Diagnosis not present

## 2015-12-15 DIAGNOSIS — I5032 Chronic diastolic (congestive) heart failure: Secondary | ICD-10-CM | POA: Diagnosis not present

## 2015-12-15 DIAGNOSIS — D72829 Elevated white blood cell count, unspecified: Secondary | ICD-10-CM

## 2015-12-15 LAB — BASIC METABOLIC PANEL WITH GFR
BUN: 7 mg/dL (ref 7–25)
CHLORIDE: 100 mmol/L (ref 98–110)
CO2: 28 mmol/L (ref 20–31)
CREATININE: 0.63 mg/dL — AB (ref 0.70–1.33)
Calcium: 8.8 mg/dL (ref 8.6–10.3)
GFR, Est African American: >89 mL/min (ref >=60)
GFR, Est Non African American: >89 mL/min (ref >=60)
GLUCOSE: 91 mg/dL (ref 65–99)
Potassium: 3.8 mmol/L (ref 3.5–5.3)
SODIUM: 139 mmol/L (ref 135–146)

## 2015-12-15 LAB — CBC
HEMATOCRIT: 37.1 % — AB (ref 39.0–52.0)
Hemoglobin: 12.2 g/dL — ABNORMAL LOW (ref 13.0–17.0)
MCH: 29.3 pg (ref 26.0–34.0)
MCHC: 32.9 g/dL (ref 30.0–36.0)
MCV: 89 fL (ref 78.0–100.0)
MPV: 10 fL (ref 8.6–12.4)
Platelets: 252 10*3/uL (ref 150–400)
RBC: 4.17 MIL/uL — AB (ref 4.22–5.81)
RDW: 14.9 % (ref 11.5–15.5)
WBC: 10.1 10*3/uL (ref 4.0–10.5)

## 2015-12-15 MED ORDER — NYSTATIN 100000 UNIT/GM EX OINT: 1.0000 "application " | TOPICAL_OINTMENT | Freq: Two times a day (BID) | CUTANEOUS | Status: DC

## 2015-12-15 NOTE — Assessment & Plan Note (Signed)
At goal today after significant changes to his regimen while in the hospital. He is now only on diltiazem 30mg  BID and hydralazine 25mg  q8h. He reports no issues with multiple daily dosing, tolerating these well, though I suspect LE edema is worsened by these. We will continue these.

## 2015-12-15 NOTE — Assessment & Plan Note (Signed)
Repeat echo unchanged, G1DD, EF 55-60%, no RWMA, mild concentric hypertrophy. He is euvolemic. Many indicated medications for systolic CHF were stopped while inpatient. Given his normal volume status and systolic function, no diuretic or other anti-hypertensives were added today.

## 2015-12-15 NOTE — Assessment & Plan Note (Signed)
Stable, continue compression stockings.

## 2015-12-15 NOTE — Assessment & Plan Note (Signed)
Has stopped but still with significant cravings. Declines intervention today.

## 2015-12-15 NOTE — Assessment & Plan Note (Signed)
s/p severe exacerbation revealing poor functional reserve. Now resolved without findings on exam. Still deconditioned, receiving home health services, not requiring supplemental O2. Will allow continued recovery, symbicort, and ventolin. Would likely benefit from spiriva but I would like PFTs performed. Will refer for this at next office visit.

## 2015-12-15 NOTE — Assessment & Plan Note (Signed)
In setting of significant diuretic dosing while inpatient, will recheck today and replete if necessary.

## 2015-12-15 NOTE — Patient Instructions (Signed)
Thank you for coming in today!  We are checking some labs today, and we'll call you if they are abnormal. If you do not hear from me by phone or letter in 2 weeks, please call us as we may have been unable to reach you.   Our clinic's number is [phone]. Feel free to call any time with questions or concerns. We will answer any questions after hours with our 24-hour emergency line at that number as well.   - Dr. Matheau Orona  

## 2015-12-15 NOTE — Progress Notes (Signed)
Subjective: George Leon is a 60 y.o. male smoker with history of COPD, HTN, CAD, chronic pain presenting for hospital follow up.   He was evaluated in the ED 12/17 found to be in respiratory failure due to pneumonia, AECOPD and diastolic CHF exacerbation intubated until 12/22. Improved with antibiotics, steroids and diuresis, was discharged from ICU to inpatient rehabilitation on 12/23. CHF was compensated at discharge, EDW was 239lbs. Completed abx 12/25 with resolution of acute findings on CXR 12/28 (see below). His debility improved with therapy at rehabilitation and he was discharged 12/30. He has continued to receive [facility] therapy.  The discharge summary from CIR includes: "He has had improvement in activity tolerance, balance, postural control, as well as ability to compensate for deficits. He is able to complete ADL tasks with supervision and requires close supervision for ambulating 250' with RW. He is able to complete functional and familiar tasks with intermittent supervision."   Due to hypotension, norvasc, losartan, metoprolol, and valium were stopped at discharge.   Diltiazem was changed from 180mg  q24h to 30mg  BID Hydralazine 25mg  q8h was started  Started trazodone because of early morning wakenings and has taken it as needed with good effect.  Echo on 12/18 showed EF 55 - 60%, no RWMA, and mild concentric hypertrophy with normal cavity size, G1DD.  He additionally complains of red painful rash on the perineum related to flexiseal use while inpatient. This is getting better and he has been applying nystatin ointment (not powder), and requests more of this.  - ROS: As above - Long time smoker but has not had a cigarette since entering the hospital.  Objective: BP 126/86 mmHg  Pulse 87  Temp(Src) 98 F (36.7 C) (Oral)  Wt 237 lb 3.2 oz (107.593 kg)  SpO2 97% Gen: Chronically ill-appearing 60 y.o. male in no distress CV: Regular rate, no murmur; radial, DP and PT pulses 2+  bilaterally; trace LE edema with compression stockings in place, no JVD, cap refill < 2 sec. Pulm: Non-labored breathing ambient air; CTAB, no wheezes or crackles  12/06/2015 CXR 2v FINDINGS: Linear areas of scarring in the lingula. Right lung is clear. Heart is normal size. No confluent opacities or effusions. No acute bony abnormality. IMPRESSION: No active cardiopulmonary disease.  Assessment/Plan: George Leon is a 60 y.o. male here for hospital follow up.  HYPERTENSION, BENIGN At goal today after significant changes to his regimen while in the hospital. He is now only on diltiazem 30mg  BID and hydralazine 25mg  q8h. He reports no issues with multiple daily dosing, tolerating these well, though I suspect LE edema is worsened by these. We will continue these.   Diastolic CHF ([place]) Repeat echo unchanged, G1DD, EF 55-60%, no RWMA, mild concentric hypertrophy. He is euvolemic. Many indicated medications for systolic CHF were stopped while inpatient. Given his normal volume status and systolic function, no diuretic or other anti-hypertensives were added today.   Chronic venous insufficiency Stable, continue compression stockings.   COPD (chronic obstructive pulmonary disease) (HCC) s/p severe exacerbation revealing poor functional reserve. Now resolved without findings on exam. Still deconditioned, receiving home health services, not requiring supplemental O2. Will allow continued recovery, symbicort, and ventolin. Would likely benefit from spiriva but I would like PFTs performed. Will refer for this at next office visit.   TOBACCO ABUSE Has stopped but still with significant cravings. Declines intervention today.   Hypokalemia In setting of significant diuretic dosing while inpatient, will recheck today and replete if necessary.

## 2015-12-19 ENCOUNTER — Telehealth: Payer: Self-pay | Admitting: *Deleted

## 2015-12-19 ENCOUNTER — Encounter: Payer: Self-pay | Admitting: Family Medicine

## 2015-12-19 ENCOUNTER — Ambulatory Visit (INDEPENDENT_AMBULATORY_CARE_PROVIDER_SITE_OTHER): Payer: Medicaid Other | Admitting: Family Medicine

## 2015-12-19 ENCOUNTER — Telehealth: Payer: Self-pay | Admitting: Family Medicine

## 2015-12-19 VITALS — BP 135/81 | HR 92 | Temp 97.7°F | Ht 67.0 in | Wt 241.0 lb

## 2015-12-19 DIAGNOSIS — M150 Primary generalized (osteo)arthritis: Secondary | ICD-10-CM

## 2015-12-19 DIAGNOSIS — M159 Polyosteoarthritis, unspecified: Secondary | ICD-10-CM

## 2015-12-19 DIAGNOSIS — L270 Generalized skin eruption due to drugs and medicaments taken internally: Secondary | ICD-10-CM | POA: Diagnosis not present

## 2015-12-19 DIAGNOSIS — R21 Rash and other nonspecific skin eruption: Secondary | ICD-10-CM | POA: Insufficient documentation

## 2015-12-19 DIAGNOSIS — I10 Essential (primary) hypertension: Secondary | ICD-10-CM

## 2015-12-19 DIAGNOSIS — Z7189 Other specified counseling: Secondary | ICD-10-CM

## 2015-12-19 DIAGNOSIS — G8929 Other chronic pain: Secondary | ICD-10-CM

## 2015-12-19 MED ORDER — HYDROCODONE-ACETAMINOPHEN 5-325 MG PO TABS: 2.0000 | ORAL_TABLET | ORAL | Status: DC | PRN

## 2015-12-19 NOTE — Assessment & Plan Note (Addendum)
Nonspecific eruption without airway compromise. Only change has been addition of hydralazine. 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 Will defer serology, discontinue hydralazine, start losartan (tolerated this in the past. Continue diltiazem at its lower dose of 30mg  BID and consider tapering off as this may also cause DIL. Consider anti-histone Ab at f/u.

## 2015-12-19 NOTE — Telephone Encounter (Signed)
EMERGENCY TELEPHONE LINE:  Returned page to Emergency Pager. States he was in ICU December 17 for seven days while on ventilator for pneumonia. Discharged to Rehab and then was able to go home December 31. Hospital Follow up with PCP on Friday, stating that he had a mild rash one of his legs since that time. Rash started Thursday and has progressively worsened since that time and is now quite extensive on lower extremities and upper extremities. Describes rash as pruritic welts with water blisters. Denies fevers or shortness of breath. Has only started one new medication, Hydralazine, while in ICU and denies any changes in medications or oral intake since that time. States Goldbond helps with itching on legs, but does not relieve itching of upper extremities. Recommend Benadryl for acute itching and rash with close follow up in clinic today. To call 911 immediately if shortness of breath occurs. States agreement with plan.  Dr. Junie Panning 12/19/15; 6:30 AM

## 2015-12-19 NOTE — Telephone Encounter (Signed)
Prior Authorization received from [place] for Losartan 50 mg. Formulary and PA form placed in provider box for completion. Derl Barrow, RN

## 2015-12-19 NOTE — Assessment & Plan Note (Signed)
Conditions stable, no red flags for diversion. Will refill norco. Will need UDS obtained and pain agreement reviewed and signed at our next office visit.

## 2015-12-19 NOTE — Assessment & Plan Note (Signed)
BP well controlled but discontinuing possible offending agents. Will restart losartan and recheck Cr, K at 2 weeks follow up.

## 2015-12-19 NOTE — Assessment & Plan Note (Signed)
Will likely require repeat injections at next office visit.

## 2015-12-19 NOTE — Patient Instructions (Signed)
Please stop hydralazine and go back on losartan. If the rash continues to worsen please return. Follow up in 2 weeks to make sure blood pressure is still good and to check labs.

## 2015-12-19 NOTE — Telephone Encounter (Signed)
Called patient to offer flu vaccine. Patient has appt today at 2:30 with Dr. Bonner Puna for rash Patient states he does want to receive flu vaccine today. Velora Heckler, RN

## 2015-12-19 NOTE — Progress Notes (Signed)
Subjective: George Leon is a 60 y.o. male accompanied by his wife, George Leon, presenting for a rash.  He reports a intensely itchy rash on his legs, arms, hands, and trunk worsening over the past 36 hours. New areas continue to pop up and seem to go away if he does not scratch them. No medications tried, no tick or insect exposures, new travel or new detergents/soaps. He does not feel systemically ill. No fever, mouth sores, face or tongue swelling, trouble breathing, joint swelling, joint pain, muscle aches. No one else has similar symptoms.   His knees are starting to ache worse with walking. Received significant improvement with injections > 3 months ago. Norco has also helped the pain and he has only a few of these left.   - ROS: As above - Still not smoking since leaving the hospital  Objective: BP 135/81 mmHg  Pulse 92  Temp(Src) 97.7 F (36.5 C) (Oral)  Ht 5\' 7"  (1.702 m)  Wt 241 lb (109.317 kg)  BMI 37.74 kg/m2 Gen: Generally well-appearing 60 y.o. male in no distress MSK: No frank joint deformity/effusion, full active ROM Skin: Scattered pruritic, erythematous, maculopapular rash including hands (sparing interdigital spaces), forearms, and lower legs bilaterally. No scale or discharge.  Assessment/Plan: [name] is a 60 y.o. male here for rash.  HYPERTENSION, BENIGN BP well controlled but discontinuing possible offending agents. Will restart losartan and recheck Cr, K at 2 weeks follow up.  Osteoarthritis of multiple joints Will likely require repeat injections at next office visit.   Encounter for chronic pain management Conditions stable, no red flags for diversion. Will refill norco. Will need UDS obtained and pain agreement reviewed and signed at our next office visit.   Drug-induced skin rash Nonspecific eruption without airway compromise. Only change has been addition of hydralazine. Drug-induced lupus was considered with hydralazine (though has not  probably been on this for long enough), but has had no arthralgia, myalgia, serositis, fever, or malaise. Will defer serology, discontinue hydralazine, start losartan (tolerated this in the past. Continue diltiazem at its lower dose of 30mg  BID and consider tapering off as this may also cause DIL. Consider anti-histone Ab at f/u.

## 2015-12-20 ENCOUNTER — Ambulatory Visit: Payer: Medicaid Other | Admitting: Family Medicine

## 2015-12-20 NOTE — Telephone Encounter (Signed)
Received PA approval for Losartan 50 mg via [place] [facility].  Med approved for 12/20/2015 - 12/14/2016.  Medication is approved for 30 tablets/30 days.  Patient has tried and failed Lisinopril 20 mg due to cough. [facility] pharmacy informed.  PA approval number [id number]. Derl Barrow, RN

## 2015-12-20 NOTE — Telephone Encounter (Signed)
PA form completed, returned to [facility] office.

## 2016-01-03 DIAGNOSIS — J440 Chronic obstructive pulmonary disease with acute lower respiratory infection: Secondary | ICD-10-CM | POA: Diagnosis not present

## 2016-01-03 DIAGNOSIS — I2510 Atherosclerotic heart disease of native coronary artery without angina pectoris: Secondary | ICD-10-CM | POA: Diagnosis not present

## 2016-01-03 DIAGNOSIS — J129 Viral pneumonia, unspecified: Secondary | ICD-10-CM | POA: Diagnosis not present

## 2016-01-03 DIAGNOSIS — J449 Chronic obstructive pulmonary disease, unspecified: Secondary | ICD-10-CM | POA: Diagnosis not present

## 2016-01-18 ENCOUNTER — Encounter: Payer: Self-pay | Admitting: Family Medicine

## 2016-01-18 ENCOUNTER — Ambulatory Visit (INDEPENDENT_AMBULATORY_CARE_PROVIDER_SITE_OTHER): Payer: Medicaid Other | Admitting: Family Medicine

## 2016-01-18 VITALS — BP 143/88 | HR 104 | Temp 98.2°F | Ht 67.0 in | Wt 241.2 lb

## 2016-01-18 DIAGNOSIS — Z23 Encounter for immunization: Secondary | ICD-10-CM

## 2016-01-18 DIAGNOSIS — M1712 Unilateral primary osteoarthritis, left knee: Secondary | ICD-10-CM | POA: Diagnosis not present

## 2016-01-18 DIAGNOSIS — F17200 Nicotine dependence, unspecified, uncomplicated: Secondary | ICD-10-CM

## 2016-01-18 DIAGNOSIS — M159 Polyosteoarthritis, unspecified: Secondary | ICD-10-CM

## 2016-01-18 DIAGNOSIS — M7021 Olecranon bursitis, right elbow: Secondary | ICD-10-CM | POA: Diagnosis not present

## 2016-01-18 DIAGNOSIS — M5090 Cervical disc disorder, unspecified, unspecified cervical region: Secondary | ICD-10-CM | POA: Diagnosis not present

## 2016-01-18 DIAGNOSIS — M545 Low back pain, unspecified: Secondary | ICD-10-CM | POA: Insufficient documentation

## 2016-01-18 DIAGNOSIS — M1711 Unilateral primary osteoarthritis, right knee: Secondary | ICD-10-CM

## 2016-01-18 DIAGNOSIS — M150 Primary generalized (osteo)arthritis: Secondary | ICD-10-CM

## 2016-01-18 MED ORDER — LOSARTAN POTASSIUM 50 MG PO TABS: 50.0000 mg | ORAL_TABLET | Freq: Every day | ORAL | Status: DC

## 2016-01-18 MED ORDER — TRAZODONE HCL 50 MG PO TABS: 25.0000 mg | ORAL_TABLET | Freq: Every evening | ORAL | Status: DC | PRN

## 2016-01-18 MED ORDER — METHYLPREDNISOLONE ACETATE 80 MG/ML IJ SUSP
80.0000 mg | Freq: Once | INTRAMUSCULAR | Status: AC
  Administered 2016-01-18: 80 mg via INTRA_ARTICULAR

## 2016-01-18 MED ORDER — HYDROCODONE-ACETAMINOPHEN 5-325 MG PO TABS: 2.0000 | ORAL_TABLET | ORAL | Status: DC | PRN

## 2016-01-18 MED FILL — HYDROCODON-APAP 5-325: 5-325 | 30 days supply | Qty: 240 | Fill #0

## 2016-01-18 NOTE — Assessment & Plan Note (Signed)
Injected today without complication. Continue po analgesics, exercises, and consider PT referral.

## 2016-01-18 NOTE — Assessment & Plan Note (Signed)
Has returned to smoking. He is refractory to in depth discussion about this at this time, and declines any pharmaceutical assistance. He will continue to consider quitting and will let me know if I can help.

## 2016-01-18 NOTE — Assessment & Plan Note (Signed)
Remains today without evidence of infection. Recommend anti-inflammatories prn, protection. If persists, could work-up by ruling out RA and further treat with hard orthosis, and eventually possible surgical referral. Advised prompt return if heat, erythema, or fever develop.

## 2016-01-18 NOTE — Progress Notes (Signed)
Subjective: George Leon is a 60 y.o. male presenting for knee pain.  Takes norco for neck pain related to disc disease for which he has had an operation. He has intermittent severe lancinating pain and occasional numbness in his arms and hands bilaterally, and lower back pain spondylosis on imaging without sciatica symptoms. He reports this has worsened over the past 6 - 12 months. Also has pain in his knees and lower back which is improved with the medication. Injection has provided significant relief in the past, improving mobility. No giving out, locking, injury, falls. No hip pain. Has continued with medications with partial but still unsatisfactory results.   Still has swelling over the right elbow that is irritating but not terribly bothersome for the past 4 months. Can extend his arm completely without pain. Denies redness, heat, or fevers.   Pt denies any current bowel/bladder problems, chills, unintentional weight loss, night time awakenings secondary to pain, weakness in one or both legs.  - Relapsed back to smoking cigarettes again. His wife is very disappointed about this.  Objective: BP 143/88 mmHg  Pulse 104  Temp(Src) 98.2 F (36.8 C) (Oral)  Ht 5\' 7"  (1.702 m)  Wt 241 lb 3.2 oz (109.408 kg)  BMI 37.77 kg/m2 Gen: Pleasant, obese 60 y.o. male in no distress. Elbow: Focal swelling over the olecranon process of the right ulna, mildly tender without erythema or warmth. Full AROM of elbow without pain. Neck: supple, full AROM, + spurling's bilaterally Back:  Normal skin. Spine with normal alignment. No tenderness to vertebral process palpation. Paraspinous muscles are not tender and without spasm. Range of motion is full at neck and lumbar sacral regions. Straight leg raise is negative. Neuro:  Sensation and motor function 5/5 bilateral lower extremities. Patellar and achilles DTR's 2+ Knee: Small effusion without erythema or warmth bilaterally. + crepitus with full strength  and ROM. Negative anterior drawer, thesally and McMurray tests.   PROCEDURE NOTE: RIGHT KNEE INJECTION: Patient was given informed consent, signed copy in the chart. Appropriate time out was taken. Area prepped and draped in usual sterile fashion. 1 cc of methylprednisolone 80 mg/ml mixed in 4 cc of 1% lidocaine without epinephrine was injected into the right knee using anterior lateral approach. The patient tolerated the procedure well. There were no complications. Post procedure instructions were given.  PROCEDURE NOTE: LEFT KNEE INJECTION: Patient was given informed consent, signed copy in the chart. Appropriate time out was taken. Area prepped and draped in usual sterile fashion. 1 cc of methylprednisolone 80 mg/ml mixed in 4 cc of 1% lidocaine without epinephrine was injected into the left knee using anterior lateral approach. The patient tolerated the procedure well. There were no complications. Post procedure instructions were given.  Assessment/Plan: George Leon is a 60 y.o. male here for knee pain related to osteoarthritis.  Olecranon bursitis of right elbow Remains today without evidence of infection. Recommend anti-inflammatories prn, protection. If persists, could work-up by ruling out RA and further treat with hard orthosis, and eventually possible surgical referral. Advised prompt return if heat, erythema, or fever develop.   Cervical neck pain with evidence of disc disease Long-standing history with history of surgery, though reports recent worsening of neck pain and radicular symptoms. Will get XR and possibly refer to neurosurgery (has seen Dr. Sherwood Gambler in the past).   Low back pain With previous imaging showing spondylosis. No sciatica, though this is generally worsening. Will check XR, advised exercises and could consider PT.  Osteoarthritis of left knee Injected today without complication. Continue po analgesics, exercises, and consider PT referral.   Osteoarthritis  of right knee Injected today without complication. Continue po analgesics, exercises, and consider PT referral.    TOBACCO ABUSE Has returned to smoking. He is refractory to in depth discussion about this at this time, and declines any pharmaceutical assistance. He will continue to consider quitting and will let me know if I can help.

## 2016-01-18 NOTE — Assessment & Plan Note (Signed)
With previous imaging showing spondylosis. No sciatica, though this is generally worsening. Will check XR, advised exercises and could consider PT.

## 2016-01-18 NOTE — Assessment & Plan Note (Signed)
Long-standing history with history of surgery, though reports recent worsening of neck pain and radicular symptoms. Will get XR and possibly refer to neurosurgery (has seen Dr. Sherwood Gambler in the past).

## 2016-01-18 NOTE — Patient Instructions (Signed)
Go get the xrays and I will call you with the results I have refilled your pain medications STOP SMOKING STOP SMOKING STOP SMOKING, please let me know if you would like any help.

## 2016-01-19 ENCOUNTER — Other Ambulatory Visit: Payer: Self-pay | Admitting: Physical Medicine and Rehabilitation

## 2016-01-19 ENCOUNTER — Other Ambulatory Visit: Payer: Self-pay | Admitting: Family Medicine

## 2016-01-19 ENCOUNTER — Telehealth: Payer: Self-pay | Admitting: *Deleted

## 2016-01-19 DIAGNOSIS — I10 Essential (primary) hypertension: Secondary | ICD-10-CM

## 2016-01-19 MED ORDER — LOSARTAN POTASSIUM 100 MG PO TABS: 100.0000 mg | ORAL_TABLET | Freq: Every day | ORAL | Status: DC

## 2016-01-19 NOTE — Telephone Encounter (Signed)
Prior Authorization received from [place] for losartan 100 mg. Received PA approval for Losartan 100 mg via [place] [facility].  Med approved for 01/19/16 - 01/18/17.  [facility] pharmacy informed.  PA approval number [id number]. Approved for 30 tablets per 30 days.  Derl Barrow, RN

## 2016-02-05 ENCOUNTER — Other Ambulatory Visit: Payer: Self-pay | Admitting: Family Medicine

## 2016-02-05 ENCOUNTER — Ambulatory Visit (HOSPITAL_COMMUNITY)
Admission: RE | Admit: 2016-02-05 | Discharge: 2016-02-05 | Disposition: A | Discharge status: Home / Self Care | Payer: Medicaid Other | Source: Ambulatory Visit | Attending: Family Medicine | Admitting: Family Medicine

## 2016-02-05 DIAGNOSIS — M545 Low back pain, unspecified: Secondary | ICD-10-CM

## 2016-02-05 DIAGNOSIS — M5090 Cervical disc disorder, unspecified, unspecified cervical region: Secondary | ICD-10-CM | POA: Diagnosis not present

## 2016-02-05 DIAGNOSIS — M8938 Hypertrophy of bone, other site: Secondary | ICD-10-CM | POA: Insufficient documentation

## 2016-02-05 DIAGNOSIS — M5136 Other intervertebral disc degeneration, lumbar region: Secondary | ICD-10-CM | POA: Insufficient documentation

## 2016-02-05 MED ORDER — LOSARTAN POTASSIUM 100 MG PO TABS: 100.0000 mg | ORAL_TABLET | Freq: Every day | ORAL | Status: DC

## 2016-02-15 ENCOUNTER — Encounter: Payer: Self-pay | Admitting: Family Medicine

## 2016-02-15 ENCOUNTER — Ambulatory Visit (INDEPENDENT_AMBULATORY_CARE_PROVIDER_SITE_OTHER): Payer: Medicaid Other | Admitting: Family Medicine

## 2016-02-15 VITALS — BP 129/77 | HR 92 | Temp 98.0°F | Ht 67.0 in | Wt 242.6 lb

## 2016-02-15 DIAGNOSIS — I10 Essential (primary) hypertension: Secondary | ICD-10-CM | POA: Diagnosis present

## 2016-02-15 DIAGNOSIS — M150 Primary generalized (osteo)arthritis: Secondary | ICD-10-CM

## 2016-02-15 DIAGNOSIS — M159 Polyosteoarthritis, unspecified: Secondary | ICD-10-CM

## 2016-02-15 DIAGNOSIS — Z7189 Other specified counseling: Secondary | ICD-10-CM

## 2016-02-15 DIAGNOSIS — Z114 Encounter for screening for human immunodeficiency virus [HIV]: Secondary | ICD-10-CM | POA: Diagnosis not present

## 2016-02-15 DIAGNOSIS — G8929 Other chronic pain: Secondary | ICD-10-CM

## 2016-02-15 DIAGNOSIS — Z5181 Encounter for therapeutic drug level monitoring: Secondary | ICD-10-CM | POA: Diagnosis not present

## 2016-02-15 DIAGNOSIS — M7021 Olecranon bursitis, right elbow: Secondary | ICD-10-CM

## 2016-02-15 LAB — MAGNESIUM: MAGNESIUM: 1.9 mg/dL (ref 1.5–2.5)

## 2016-02-15 LAB — RHEUMATOID FACTOR

## 2016-02-15 LAB — BASIC METABOLIC PANEL WITH GFR
BUN: 5 mg/dL — ABNORMAL LOW (ref 7–25)
CALCIUM: 9.3 mg/dL (ref 8.6–10.3)
CO2: 25 mmol/L (ref 20–31)
Chloride: 94 mmol/L — ABNORMAL LOW (ref 98–110)
Creat: 0.6 mg/dL — ABNORMAL LOW (ref 0.70–1.33)
Glucose, Bld: 90 mg/dL (ref 65–99)
Potassium: 3.9 mmol/L (ref 3.5–5.3)
SODIUM: 128 mmol/L — AB (ref 135–146)

## 2016-02-15 MED ORDER — HYDROCODONE-ACETAMINOPHEN 5-325 MG PO TABS
2.0000 | ORAL_TABLET | ORAL | Status: DC | PRN
Start: 2016-02-15 — End: 2016-03-04

## 2016-02-15 MED FILL — HYDROCODON-APAP 5-325: 5-325 | 21 days supply | Qty: 240 | Fill #0

## 2016-02-15 NOTE — Patient Instructions (Signed)
Thank you for coming in today!  We are checking some labs today, and we'll call you if they are abnormal. If you do not hear from me by phone or letter in 2 weeks, please call us as we may have been unable to reach you.   - Put an ACE wrap on that elbow to protect it and try to keep the fluid from reaccumulating.  - Keep taking the BP medication, it's working great - Let me know if you want to get the neck MRI and referral to the surgeon. - Consider getting a colonoscopy. We didn't really talk about this but you SHOULD get one.  Our clinic's number is [phone]. Feel free to call any time with questions or concerns. We will answer any questions after hours with our 24-hour emergency line at that number as well.   - Dr. Bonner Puna

## 2016-02-15 NOTE — Progress Notes (Signed)
Subjective: George Leon is a 60 y.o. male presenting for knee pain.  Continues to report intermittent severe lancinating pain and occasional numbness in his arms and hands bilaterally, and severe, aching, constant lower back pain without sciatica symptoms. Symptoms are improved with norco, allowing him to maintain mobility and enjoy outings with his wife.  Right elbow with improved swelling, no pain, just annoyance. No redness, heat, or fevers.   Pt denies any current bowel/bladder problems, chills, unintentional weight loss, night time awakenings secondary to pain, weakness in one or both legs.  - Smoking again, precontemplative.   Objective: BP 129/77 mmHg  Pulse 92  Temp(Src) 98 F (36.7 C)  Ht 5\' 7"  (1.702 m)  Wt 242 lb 9.6 oz (110.043 kg)  BMI 37.99 kg/m2 Gen: Pleasant, obese 60 y.o. male in no distress. Elbow: Focal swelling over the olecranon process of the right ulna, nontender without erythema or warmth. Full AROM of elbow without pain. Neck: supple, full AROM, + spurling's bilaterally Back: Normal skin. Spine with normal alignment. No tenderness to vertebral process palpation. Paraspinous muscles are not tender and without spasm. Range of motion is full at neck and lumbar sacral regions. Straight leg raise is negative. Neuro:  Sensation and motor function 5/5 bilateral lower extremities. Patellar and achilles DTR's 2+  There is mild bony neural foraminal encroachment bilaterally in the mid and lower cervical spine. This is likely on the basis of facet and uncovertebral joint hypertrophy. If there are upper extremity radicular symptoms, cervical spine MRI may be useful.  There is mild multilevel degenerative disc disease of the lumbar spine. There is no compression fracture nor other acute bony abnormality. There has not been significant interval change since the study of January 21, 2011.  Assessment/Plan: George Leon is a 60 y.o. male here for knee pain  related to osteoarthritis.  Olecranon bursitis of right elbow Not septic, becoming chronic. Will check RF to r/o RA. Consider aspiration to r/o gout and apposition of orthosis. May need eventual surgical referral for removal.   Encounter for chronic pain management Reports good pain control and minimal adverse effects. Will be due for repeat UDS, resigning pain agreement at f/u.

## 2016-02-16 LAB — HIV ANTIBODY (ROUTINE TESTING W REFLEX): HIV: NONREACTIVE

## 2016-02-19 ENCOUNTER — Other Ambulatory Visit: Payer: Self-pay | Admitting: Family Medicine

## 2016-02-19 DIAGNOSIS — I10 Essential (primary) hypertension: Secondary | ICD-10-CM

## 2016-02-19 NOTE — Assessment & Plan Note (Signed)
Reports good pain control and minimal adverse effects. Will be due for repeat UDS, resigning pain agreement at f/u.

## 2016-02-19 NOTE — Assessment & Plan Note (Signed)
Not septic, becoming chronic. Will check RF to r/o RA. Consider aspiration to r/o gout and apposition of orthosis. May need eventual surgical referral for removal.

## 2016-02-23 MED ORDER — DILTIAZEM HCL 30 MG PO TABS: 30.0000 mg | ORAL_TABLET | Freq: Two times a day (BID) | ORAL | Status: DC

## 2016-03-04 ENCOUNTER — Other Ambulatory Visit: Payer: Self-pay | Admitting: Family Medicine

## 2016-03-04 DIAGNOSIS — M150 Primary generalized (osteo)arthritis: Principal | ICD-10-CM

## 2016-03-04 DIAGNOSIS — M159 Polyosteoarthritis, unspecified: Secondary | ICD-10-CM

## 2016-03-04 MED ORDER — HYDROCODONE-ACETAMINOPHEN 5-325 MG PO TABS: 2.0000 | ORAL_TABLET | ORAL | Status: DC | PRN

## 2016-03-06 ENCOUNTER — Other Ambulatory Visit: Payer: Self-pay | Admitting: Family Medicine

## 2016-03-26 ENCOUNTER — Ambulatory Visit (INDEPENDENT_AMBULATORY_CARE_PROVIDER_SITE_OTHER): Payer: Medicaid Other | Admitting: Family Medicine

## 2016-03-26 ENCOUNTER — Encounter: Payer: Self-pay | Admitting: Family Medicine

## 2016-03-26 VITALS — BP 132/81 | HR 95 | Temp 98.1°F | Ht 67.0 in | Wt 247.9 lb

## 2016-03-26 DIAGNOSIS — M25552 Pain in left hip: Secondary | ICD-10-CM | POA: Diagnosis not present

## 2016-03-26 DIAGNOSIS — M7021 Olecranon bursitis, right elbow: Secondary | ICD-10-CM | POA: Diagnosis not present

## 2016-03-26 DIAGNOSIS — I10 Essential (primary) hypertension: Secondary | ICD-10-CM

## 2016-03-26 MED ORDER — METHYLPREDNISOLONE ACETATE 80 MG/ML IJ SUSP
80.0000 mg | Freq: Once | INTRAMUSCULAR | Status: AC
  Administered 2016-03-26: 80 mg via INTRA_ARTICULAR

## 2016-03-26 NOTE — Progress Notes (Signed)
Subjective: George Leon is a 60 y.o. male presenting for olecranon bursitis, left leg pain.  Right elbow with swelling constant for 4 months, but first started about 8 months ago, hurts with certain arm movements and when pressure is placed on it. It gets inflammed intermittently and is red, but does not usually feel hot or red. Denies specific trauma or history of gout.  Left leg pain: Radiates the front and inside of the left leg "stabbing" intermittently from knee to groin worse when he arises from seated position, or ambulates a protracted distance.   - ROS: Denies fever, headache, cough, CP, palpitations, SOB, abdominal pain, N/V/D, blood in stool, change in bladder habits, myalgias, arthralgias, and rash.  - Back to smoking.   Objective: BP 132/81 mmHg  Pulse 95  Temp(Src) 98.1 F (36.7 C) (Oral)  Ht 5\' 7"  (1.702 m)  Wt 247 lb 14.4 oz (112.447 kg)  BMI 38.82 kg/m2 Gen: Well-appearing 60 y.o. male in no distress Right elbow: ~8cm diameter nonerythematous boggy bursa mildly tender to palpation.  MSK: Diffusely weak hip flexors with difficulty standing on left leg alone. No tenderness to palpation over left greater trochanter. Groin pain reproduced with internal and external rotation of hip.  PROCEDURE NOTE: RIGHT OLECRANON BURSA INJECTION: Patient was given informed consent, signed copy in the chart. Appropriate time out was taken. Area prepped and draped in usual sterile fashion. 1 cc of methylprednisolone 80 mg/ml mixed in 3 cc of 1% lidocaine without epinephrine was injected into the right olecranon bursa. The patient tolerated the procedure well. There were no complications. Post procedure instructions were given.  Assessment/Plan: George Leon is a 60 y.o. male here for olecranon bursitis and right hip pain.  HYPERTENSION, BENIGN At goal, continue current treatment.   Olecranon bursitis of right elbow Steroid injection today to calm inflammation. Consider surgery  referral in May when returning for knee injections.   Left hip pain Consistent with OA, given h/o of this at multiple sites. Reviewed exercises for general strengthening, as muscle strain could also be involved due to gait modification from antalgic gait from knee OA. Check hip XR.

## 2016-03-26 NOTE — Assessment & Plan Note (Signed)
- 

## 2016-03-26 NOTE — Assessment & Plan Note (Signed)
Consistent with OA, given h/o of this at multiple sites. Reviewed exercises for general strengthening, as muscle strain could also be involved due to gait modification from antalgic gait from knee OA. Check hip XR.

## 2016-03-26 NOTE — Assessment & Plan Note (Signed)
Steroid injection today to calm inflammation. Consider surgery referral in May when returning for knee injections.

## 2016-03-26 NOTE — Patient Instructions (Addendum)
Thank you for coming in today!  - You need to get an xray of your left hip at [facility], we will discuss the results when they return - Do these stretches to help with hip pain  Our clinic's number is [phone]. Feel free to call any time with questions or concerns. We will answer any questions after hours with our 24-hour emergency line at that number as well.    - Dr. Rolene Course - Hip Rotators  Lie on your back on a firm surface. Grasp your right / left knee with your right / left hand and your ankle with your opposite hand.  Keeping your hips and shoulders firmly planted, gently pull your right / left knee and rotate your lower leg toward your opposite shoulder until you feel a stretch in your buttocks.  Hold this stretch for __________ seconds. Repeat this stretch __________ times. Complete this stretch __________ times per day. STRETCH - Iliotibial Band  On the floor or bed, lie on your side so your right / left leg is on top. Bend your knee and grab your ankle.  Slowly bring your knee back so that your thigh is in line with your trunk. Keep your heel at your buttocks and gently arch your back so your head, shoulders, and hips line up.  Slowly lower your leg so that your knee approaches the floor/bed until you feel a gentle stretch on the outside of your right / left thigh. If you do not feel a stretch and your knee will not fall farther, place the heel of your opposite foot on top of your knee and pull your thigh down farther.  Hold this stretch for __________ seconds. Repeat __________ times. Complete __________ times per day. STRENGTHENING EXERCISES - Piriformis Syndrome  These are some of the caregiver again or until your symptoms are resolved. Remember:   Strong muscles with good endurance tolerate stress better.  Do the exercises as initially prescribed by your caregiver. Progress slowly with each exercise, gradually increasing the number of repetitions  and weight used under their guidance. STRENGTH - Hip Abductors, Straight Leg Raises Be aware of your form throughout the entire exercise so that you exercise the correct muscles. Sloppy form means that you are not strengthening the correct muscles.  Lie on your side so that your head, shoulders, knee, and hip line up. You may bend your lower knee to help maintain your balance. Your right / left leg should be on top.  Roll your hips slightly forward, so that your hips are stacked directly over each other and your right / left knee is facing forward.  Lift your top leg up 4-6 inches, leading with your heel. Be sure that your foot does not drift forward or that your knee does not roll toward the ceiling.  Hold this position for __________ seconds. You should feel the muscles in your outer hip lifting (you may not notice this until your leg begins to tire).  Slowly lower your leg to the starting position. Allow the muscles to fully relax before beginning the next repetition. Repeat __________ times. Complete this exercise __________ times per day.  STRENGTH - Hip Abductors, Quadruped  On a firm, lightly padded surface, position yourself on your hands and knees. Your hands should be directly below your shoulders and your knees should be directly below your hips.  Keeping your right / left knee bent, lift your leg out to the side. Keep your legs level  and in line with your shoulders.  Position yourself on your hands and knees.  Hold for __________ seconds.  Keeping your trunk steady and your hips level, slowly lower your leg to the starting position. Repeat __________ times. Complete this exercise __________ times per day.  STRENGTH - Hip Abductors, Standing  Tie one end of a rubber exercise band/tubing to a secure surface (table, pole) and tie a loop at the other end.  Place the loop around your right / left ankle. Keeping your ankle with the band directly opposite of the secured end, step  away until there is tension in the tube/band.  Hold onto a chair as needed for balance.  Keeping your back upright, your shoulders over your hips, and your toes pointing forward, lift your right / left leg out to your side. Be sure to lift your leg with your hip muscles. Do not "throw" your leg or tip your body to lift your leg.  Slowly and with control, return to the starting position. Repeat exercise __________ times. Complete this exercise __________ times per day.

## 2016-04-12 ENCOUNTER — Encounter: Payer: Self-pay | Admitting: Family Medicine

## 2016-04-15 ENCOUNTER — Other Ambulatory Visit: Payer: Self-pay | Admitting: Family Medicine

## 2016-04-15 ENCOUNTER — Telehealth: Payer: Self-pay | Admitting: Family Medicine

## 2016-04-15 DIAGNOSIS — E871 Hypo-osmolality and hyponatremia: Secondary | ICD-10-CM

## 2016-04-15 DIAGNOSIS — M150 Primary generalized (osteo)arthritis: Principal | ICD-10-CM

## 2016-04-15 DIAGNOSIS — R252 Cramp and spasm: Secondary | ICD-10-CM | POA: Insufficient documentation

## 2016-04-15 DIAGNOSIS — M159 Polyosteoarthritis, unspecified: Secondary | ICD-10-CM

## 2016-04-15 MED ORDER — HYDROCODONE-ACETAMINOPHEN 5-325 MG PO TABS
2.0000 | ORAL_TABLET | ORAL | Status: DC | PRN
Start: 2016-04-15 — End: 2016-04-25

## 2016-04-15 NOTE — Telephone Encounter (Signed)
Family Medicine Emergency Line Telephone Note  Returned call to George Leon who reports intermittent cramping for the past several weeks, now worsening in frequency, keeping him up at night. He was not aware that he had called an emergency line, and just wanted to let his doctor know of this. I am his PCP, and we discussed this in enough detail that I do not suspect acute, life-threatening cause.   - Follow up as scheduled on 5/18. - Will order future labs I had planned on repeating after last visit to investigate metabolic causes of complaint in setting of recent hyponatremia. - Will fill short course of narcotics until follow up (last filled 4/9).   Malasha Kleppe B. Bonner Puna, MD, PGY-3 04/15/2016 6:49 PM

## 2016-04-15 NOTE — Telephone Encounter (Signed)
Pt called and would like a refill on his Hydrocodone it is due on May 9th and he has an appointment on May 18th. Can we refill his medication so that he doesn't run out before his appointment. Please let patient when this is ready to pick up. jw

## 2016-04-16 ENCOUNTER — Other Ambulatory Visit: Payer: Medicaid Other

## 2016-04-16 DIAGNOSIS — R252 Cramp and spasm: Secondary | ICD-10-CM

## 2016-04-16 DIAGNOSIS — E871 Hypo-osmolality and hyponatremia: Secondary | ICD-10-CM

## 2016-04-16 MED FILL — HYDROCODON-APAP 5-325: 5-325 | 21 days supply | Qty: 240 | Fill #0

## 2016-04-17 LAB — BASIC METABOLIC PANEL WITH GFR
BUN: 6 mg/dL — ABNORMAL LOW (ref 7–25)
CALCIUM: 8.9 mg/dL (ref 8.6–10.3)
CHLORIDE: 94 mmol/L — AB (ref 98–110)
CO2: 23 mmol/L (ref 20–31)
CREATININE: 0.7 mg/dL (ref 0.70–1.33)
GFR, Est Non African American: >89 mL/min (ref >=60)
Glucose, Bld: 96 mg/dL (ref 65–99)
Potassium: 4.2 mmol/L (ref 3.5–5.3)
SODIUM: 129 mmol/L — AB (ref 135–146)

## 2016-04-17 LAB — MAGNESIUM: MAGNESIUM: 1.7 mg/dL (ref 1.5–2.5)

## 2016-04-25 ENCOUNTER — Encounter: Payer: Self-pay | Admitting: Family Medicine

## 2016-04-25 ENCOUNTER — Ambulatory Visit (INDEPENDENT_AMBULATORY_CARE_PROVIDER_SITE_OTHER): Payer: Medicaid Other | Admitting: Family Medicine

## 2016-04-25 VITALS — BP 129/72 | HR 94 | Temp 98.4°F | Ht 67.0 in | Wt 253.5 lb

## 2016-04-25 DIAGNOSIS — M150 Primary generalized (osteo)arthritis: Secondary | ICD-10-CM | POA: Diagnosis not present

## 2016-04-25 DIAGNOSIS — M1711 Unilateral primary osteoarthritis, right knee: Secondary | ICD-10-CM

## 2016-04-25 DIAGNOSIS — I10 Essential (primary) hypertension: Secondary | ICD-10-CM

## 2016-04-25 DIAGNOSIS — Z7189 Other specified counseling: Secondary | ICD-10-CM | POA: Diagnosis not present

## 2016-04-25 DIAGNOSIS — M159 Polyosteoarthritis, unspecified: Secondary | ICD-10-CM

## 2016-04-25 DIAGNOSIS — M1712 Unilateral primary osteoarthritis, left knee: Secondary | ICD-10-CM

## 2016-04-25 DIAGNOSIS — G8929 Other chronic pain: Secondary | ICD-10-CM

## 2016-04-25 MED ORDER — LOSARTAN POTASSIUM 100 MG PO TABS: 100.0000 mg | ORAL_TABLET | Freq: Every day | ORAL | Status: DC

## 2016-04-25 MED ORDER — HYDROCODONE-ACETAMINOPHEN 5-325 MG PO TABS: 2.0000 | ORAL_TABLET | ORAL | Status: DC | PRN

## 2016-04-25 MED ORDER — METHYLPREDNISOLONE ACETATE 80 MG/ML IJ SUSP
80.0000 mg | Freq: Once | INTRAMUSCULAR | Status: AC
  Administered 2016-04-25: 80 mg via INTRA_ARTICULAR

## 2016-04-25 MED ORDER — AMLODIPINE BESYLATE 10 MG PO TABS: 5.0000 mg | ORAL_TABLET | Freq: Every day | ORAL | Status: DC

## 2016-04-25 NOTE — Patient Instructions (Addendum)
Thank you for coming in today!  - When you follow up, we will need a urine sample and a pain contract signed - Cut down DRAMATICALLY on the number of sodas you drink. These have enough salt to be the sole cause of your swelling fluctuations - If you have another episode of the painful leg swelling especially with shortness of breath or chest pain you need to go to the ER.  - You are due for a colonoscopy Our clinic's number is [phone]. Feel free to call any time with questions or concerns. We will answer any questions after hours with our 24-hour emergency line at that number as well.   - Dr. Bonner Puna

## 2016-04-25 NOTE — Progress Notes (Signed)
Subjective: George Leon is a 60 y.o. male presenting for knee pain.  Injection has provided significant relief in the past, improving mobility, last injection a little over 3 months ago. No giving out, locking, injury, falls. No hip pain. Has continued with medications with partial but still unsatisfactory results.   Takes norco for neck pain related to disc disease for which he has had an operation. He has intermittent severe lancinating pain and occasional numbness in his arms and hands bilaterally, and lower back pain spondylosis on imaging without sciatica symptoms. He reports is severe and stable pain. Medications allow him to maintain mobility.   - ROS: Pt denies any current bowel/bladder problems, fever, chills, unintentional weight loss, night time awakenings secondary to pain, weakness in one or both legs - Smoking  Objective: BP 129/72 mmHg  Pulse 94  Temp(Src) 98.4 F (36.9 C) (Oral)  Ht 5\' 7"  (1.702 m)  Wt 253 lb 8 oz (114.987 kg)  BMI 39.69 kg/m2 Gen: Well-appearing 60 y.o. male in no distress. Knee: Small effusion without erythema or warmth bilaterally. + crepitus with full strength and ROM. Negative anterior drawer, thesally and McMurray tests.  Knee XR's: Left with medial joint space loss. Right with preserved joint space.   PROCEDURE NOTE: LEFT KNEE INJECTION: Patient was given informed consent, signed copy in the chart. Appropriate time out was taken. Area prepped and draped in usual sterile fashion. 1 cc of methylprednisolone 80 mg/ml mixed in 3 cc of 1% lidocaine without epinephrine was injected into the left knee using inferomedial approach. The patient tolerated the procedure well. There were no complications. Post procedure instructions were given.  PROCEDURE NOTE: RIGHT KNEE INJECTION: Patient was given informed consent, signed copy in the chart. Appropriate time out was taken. Area prepped and draped in usual sterile fashion. 1 cc of methylprednisolone 80 mg/ml  mixed in 3 cc of 1% lidocaine without epinephrine was injected into the right knee using inferomedial approach. The patient tolerated the procedure well. There were no complications. Post procedure instructions were given.  Knee XR's : Left with medial joint space loss. Right with preserved joint space.   Assessment/Plan: George Leon is a 60 y.o. male here for knee pain related to osteoarthritis.  Osteoarthritis of left knee - Injection today, continue conservative strategies for therapy including ongoing activity and diet modification to aid with weight loss.  Osteoarthritis of right knee - Injection today, continue conservative strategies for therapy including ongoing activity and diet modification to aid with weight loss.  HYPERTENSION, BENIGN At goal, refill medications.   Encounter for chronic pain management Will be due for re-signing pain contract with new PCP in July 2017. Repeat UDS at that time as well.

## 2016-04-26 NOTE — Assessment & Plan Note (Signed)
Will be due for re-signing pain contract with new PCP in July 2017. Repeat UDS at that time as well.

## 2016-04-26 NOTE — Assessment & Plan Note (Signed)
-   Injection today, continue conservative strategies for therapy including ongoing activity and diet modification to aid with weight loss.

## 2016-04-26 NOTE — Assessment & Plan Note (Signed)
At goal, refill medications.

## 2016-05-28 ENCOUNTER — Ambulatory Visit: Payer: Medicaid Other | Admitting: Family Medicine

## 2016-06-07 ENCOUNTER — Ambulatory Visit (INDEPENDENT_AMBULATORY_CARE_PROVIDER_SITE_OTHER): Payer: Managed Care, Other (non HMO) | Admitting: Family Medicine

## 2016-06-07 VITALS — BP 150/87 | HR 91 | Temp 98.4°F | Wt 252.6 lb

## 2016-06-07 DIAGNOSIS — F17200 Nicotine dependence, unspecified, uncomplicated: Secondary | ICD-10-CM

## 2016-06-07 DIAGNOSIS — M7021 Olecranon bursitis, right elbow: Secondary | ICD-10-CM

## 2016-06-07 DIAGNOSIS — Z7189 Other specified counseling: Secondary | ICD-10-CM | POA: Diagnosis not present

## 2016-06-07 DIAGNOSIS — G8929 Other chronic pain: Secondary | ICD-10-CM

## 2016-06-07 NOTE — Assessment & Plan Note (Signed)
Improving s/p steroid injection in April. No current signs of inflammation. These were reviewed with return precautions given. If continues to be an issue, consider surgical referral (he would be a poor candidate, however).

## 2016-06-07 NOTE — Patient Instructions (Signed)
Schedule a follow up with your new PCP, Dr. Cyndia Skeeters, as soon as possible.

## 2016-06-07 NOTE — Assessment & Plan Note (Signed)
Continues to be precontemplative, unfortunately, having restarted after ICU admission.

## 2016-06-07 NOTE — Assessment & Plan Note (Signed)
Mr. Poarch has been on chronic narcotics for many years, with prescriptions having been written in 3 month increments at some point. While he does need these medications to maintain even his limited functional status, I have continually addressed the need to decrease amount per month. He is now being seen every month by me with hopes that more frequent discussions would help to this end, though little progress has been made. I did not refill his medications as he is due for UDS and renewal of pain contract and he is in too much of a hurry today to accomplish these things with due diligence. Last prescription for refill was written in advance (written on 5/18 to be filled no earlier than 6/8), so he should have enough medication to last to his appointment with new PCP (this was our initial plan, see last note). He will schedule a follow up with his new PCP to have the pain contract reviewed and signed, and a UDS obtained.  - Going forward, a good goal for his care would be to spend more time addressing his other, significant and progressive, medical comorbidities.

## 2016-06-07 NOTE — Progress Notes (Signed)
Subjective: George Leon is a 60 y.o. male presenting for follow up of knee pain.  He reports improvement in bilateral knee pain following injections 5/18, though they still limit walking, requiring a cane for R > L pain.   Also reports decrease in size of right olecranon bursitis without erythema, drainage, trauma or fevers.   - ROS: Pt denies any current bowel/bladder problems, fever, chills, unintentional weight loss, night time awakenings secondary to pain, weakness in one or both legs - Smoking, declines interventions.   Objective: BP 150/87 mmHg  Pulse 91  Temp(Src) 98.4 F (36.9 C) (Oral)  Wt 252 lb 9.6 oz (114.579 kg) Gen: Well-appearing 60 y.o. male in no distress. CV: Regular rate, no murmur; trace LE edema, no JVD, cap refill < 2 sec. Pulm: Non-labored breathing ambient air, poor air movement without crackles, wheezes. Right elbow: Bursitis decreased in size to ~ 3cm diameter without erythema or tenderness. Full AROM without tenderness. Knee: Small effusion without erythema or warmth bilaterally. + crepitus with full strength and ROM.  Assessment/Plan: George Leon is a 60 y.o. male here for knee pain related to osteoarthritis.  TOBACCO ABUSE Continues to be precontemplative, unfortunately, having restarted after ICU admission.  Encounter for chronic pain management George Leon has been on chronic narcotics for many years, with prescriptions having been written in 3 month increments at some point. While he does need these medications to maintain even his limited functional status, I have continually addressed the need to decrease amount per month. He is now being seen every month by me with hopes that more frequent discussions would help to this end, though little progress has been made. I did not refill his medications as he is due for UDS and renewal of pain contract and he is in too much of a hurry today to accomplish these things with due diligence. Last  prescription for refill was written in advance (written on 5/18 to be filled no earlier than 6/8), so he should have enough medication to last to his appointment with new PCP (this was our initial plan, see last note). He will schedule a follow up with his new PCP to have the pain contract reviewed and signed, and a UDS obtained.  - Going forward, a good goal for his care would be to spend more time addressing his other, significant and progressive, medical comorbidities.   Olecranon bursitis of right elbow Improving s/p steroid injection in April. No current signs of inflammation. These were reviewed with return precautions given. If continues to be an issue, consider surgical referral (he would be a poor candidate, however).

## 2016-06-08 IMAGING — CR DG KNEE COMPLETE 4+V*L*
4 series · 4 of 4 positions shown · non-contrast
Comparison: None.

CLINICAL DATA: 59-year-old male with chronic bilateral knee pain
greater on the left. Primary osteoarthritis of the knee. Initial
encounter.

EXAM:
LEFT KNEE - COMPLETE 4+ VIEW

[knee ap]
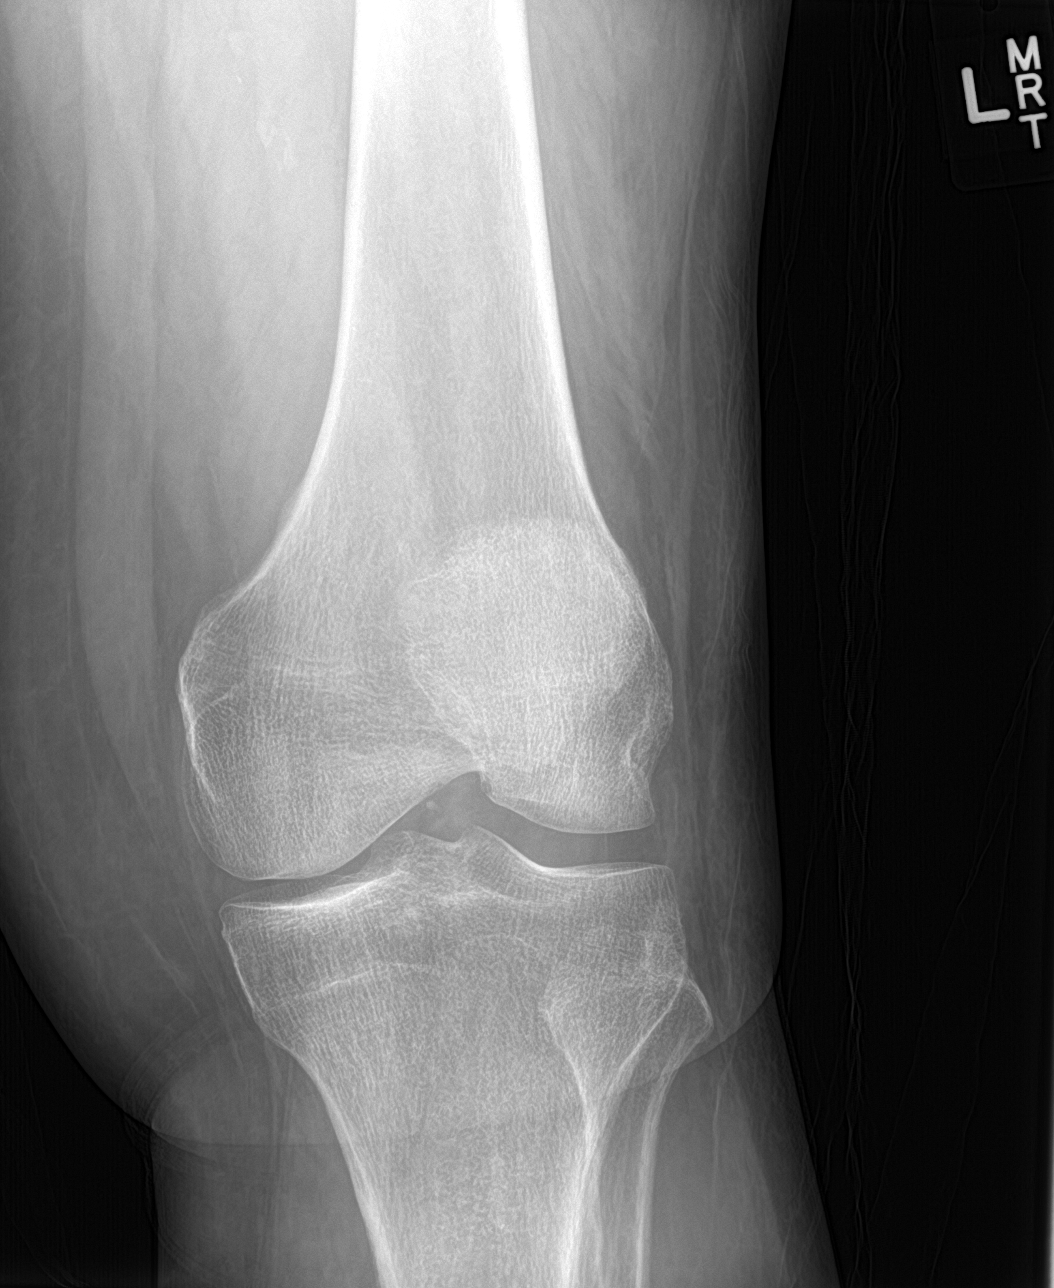

[tunnel]
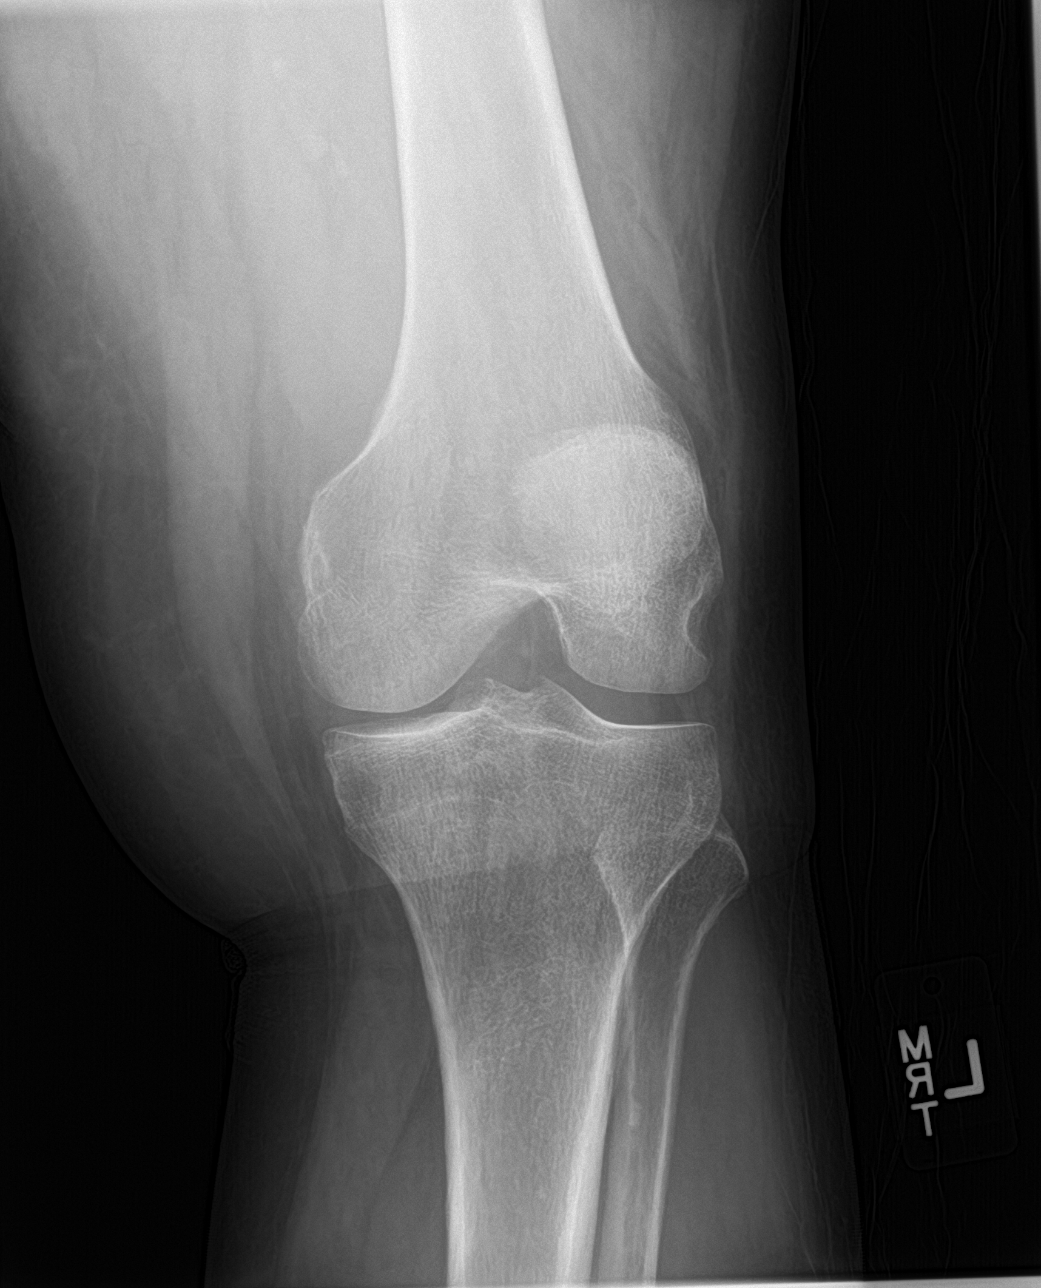

[knee lat]
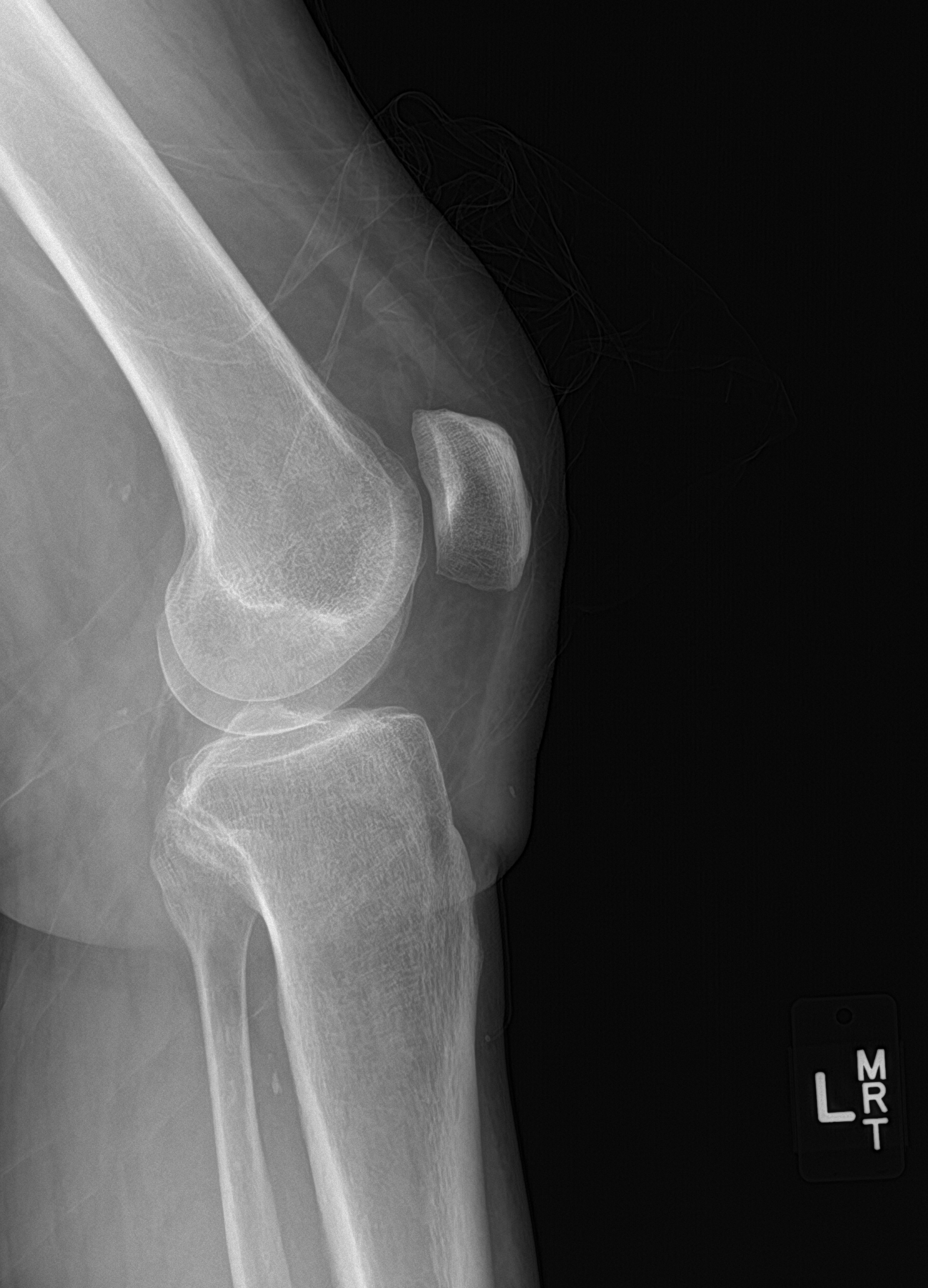

[knee sunrise]
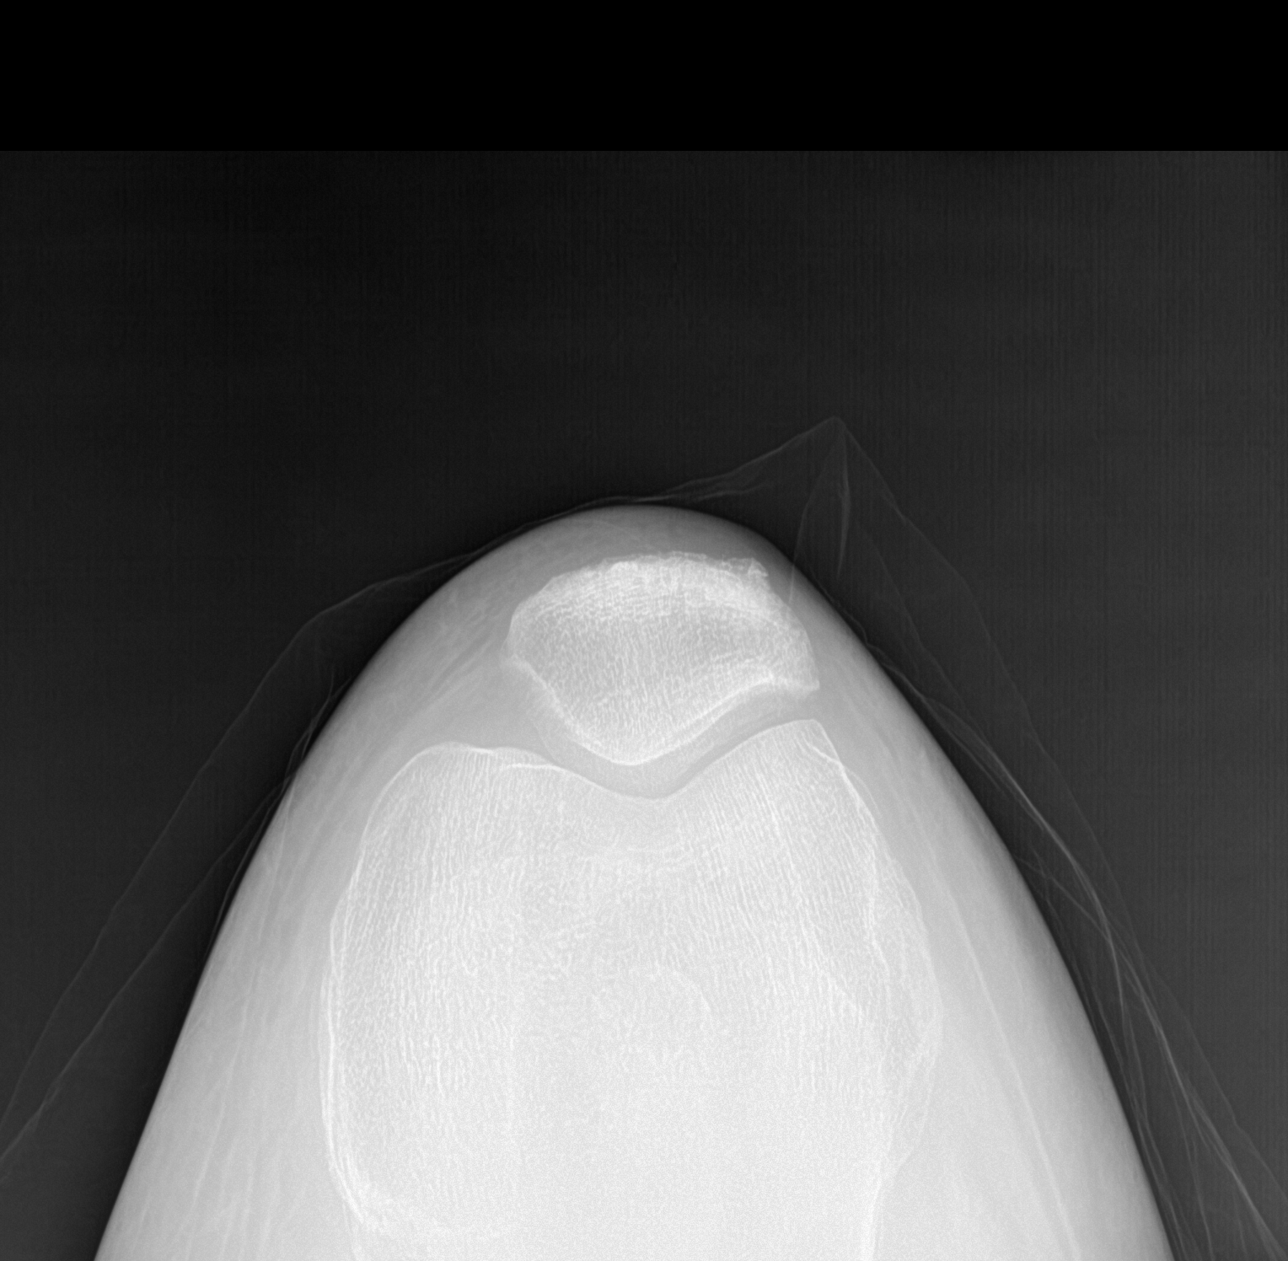

[4 of 4 positions shown; findings below may reference images not displayed]

FINDINGS: Mild to moderate medial compartment joint space loss. Other joint
spaces appear preserved. The patella is intact. No joint effusion.
Bone mineralization is within normal limits. Mild calcified
atherosclerosis in the visible left lower extremity.
IMPRESSION: Medial compartment joint space loss. No joint effusion or acute
osseous abnormality identified.

## 2016-06-14 ENCOUNTER — Encounter: Payer: Self-pay | Admitting: Student

## 2016-06-14 ENCOUNTER — Ambulatory Visit (INDEPENDENT_AMBULATORY_CARE_PROVIDER_SITE_OTHER): Payer: Managed Care, Other (non HMO) | Admitting: Student

## 2016-06-14 VITALS — BP 134/90 | HR 99 | Temp 98.2°F | Ht 67.0 in | Wt 257.8 lb

## 2016-06-14 DIAGNOSIS — R52 Pain, unspecified: Secondary | ICD-10-CM | POA: Diagnosis not present

## 2016-06-14 DIAGNOSIS — M159 Polyosteoarthritis, unspecified: Secondary | ICD-10-CM

## 2016-06-14 DIAGNOSIS — M150 Primary generalized (osteo)arthritis: Secondary | ICD-10-CM | POA: Diagnosis not present

## 2016-06-14 DIAGNOSIS — Z7189 Other specified counseling: Secondary | ICD-10-CM | POA: Diagnosis not present

## 2016-06-14 DIAGNOSIS — G8929 Other chronic pain: Secondary | ICD-10-CM

## 2016-06-14 DIAGNOSIS — G894 Chronic pain syndrome: Secondary | ICD-10-CM | POA: Diagnosis not present

## 2016-06-14 MED ORDER — HYDROCODONE-ACETAMINOPHEN 5-325 MG PO TABS: 2.0000 | ORAL_TABLET | ORAL | Status: DC | PRN

## 2016-06-14 MED ORDER — DULOXETINE HCL 30 MG PO CPEP: 30.0000 mg | ORAL_CAPSULE | Freq: Every day | ORAL | Status: DC

## 2016-06-14 NOTE — Patient Instructions (Signed)
It was great seeing you today! We have addressed the following issues today   Pain: I refilled her prescription for Norco, quantity 240 for one month. I have also sent a prescription for Cymbalta to your pharmacy which can help with pain. I will see her back in one month's to review the progress. We may discuss about physical therapy at that time.     If we did any lab work today, and the results require attention, either me or my nurse will get in touch with you. If everything is normal, you will get a letter in mail. If you don't hear from [place] in two weeks, please give [place] a call. Otherwise, I look forward to talking with you again at our next visit. If you have any questions or concerns before then, please call the clinic at [phone].  Please bring all your medications to every doctors visit   Sign up for My Chart to have easy access to your labs results, and communication with your Primary care physician.    Please check-out at the front desk before leaving the clinic.   Take Care,   Chronic Pain Chronic pain can be defined as pain that is off and on and lasts for 3-6 months or longer. Many things cause chronic pain, which can make it difficult to make a diagnosis. There are many treatment options available for chronic pain. However, finding a treatment that works well for you may require trying various approaches until the right one is found. Many people benefit from a combination of two or more types of treatment to control their pain. SYMPTOMS  Chronic pain can occur anywhere in the body and can range from mild to very severe. Some types of chronic pain include:  Headache.  Low back pain.  Cancer pain.  Arthritis pain.  Neurogenic pain. This is pain resulting from damage to nerves. People with chronic pain may also have other symptoms such as:  Depression.  Anger.  Insomnia.  Anxiety. DIAGNOSIS  Your health care provider will help diagnose your condition over  time. In many cases, the initial focus will be on excluding possible conditions that could be causing the pain. Depending on your symptoms, your health care provider may order tests to diagnose your condition. Some of these tests may include:   Blood tests.   CT scan.   MRI.   X-rays.   Ultrasounds.   Nerve conduction studies.  You may need to see a specialist.  TREATMENT  Finding treatment that works well may take time. You may be referred to a pain specialist. He or she may prescribe medicine or therapies, such as:   Mindful meditation or yoga.  Shots (injections) of numbing or pain-relieving medicines into the spine or area of pain.  Local electrical stimulation.  Acupuncture.   Massage therapy.   Aroma, color, light, or sound therapy.   Biofeedback.   Working with a physical therapist to keep from getting stiff.   Regular, gentle exercise.   Cognitive or behavioral therapy.   Group support.  Sometimes, surgery may be recommended.  HOME CARE INSTRUCTIONS   Take all medicines as directed by your health care provider.   Lessen stress in your life by relaxing and doing things such as listening to calming music.   Exercise or be active as directed by your health care provider.   Eat a healthy diet and include things such as vegetables, fruits, fish, and lean meats in your diet.   Keep  all follow-up appointments with your health care provider.   Attend a support group with others suffering from chronic pain. SEEK MEDICAL CARE IF:   Your pain gets worse.   You develop a new pain that was not there before.   You cannot tolerate medicines given to you by your health care provider.   You have new symptoms since your last visit with your health care provider.  SEEK IMMEDIATE MEDICAL CARE IF:   You feel weak.   You have decreased sensation or numbness.   You lose control of bowel or bladder function.   Your pain suddenly gets much  worse.   You develop shaking.  You develop chills.  You develop confusion.  You develop chest pain.  You develop shortness of breath.  MAKE SURE YOU:  Understand these instructions.  Will watch your condition.  Will get help right away if you are not doing well or get worse.   This information is not intended to replace advice given to you by your health care provider. Make sure you discuss any questions you have with your health care provider.   Document Released: 08/17/2002 Document Revised: 07/28/2013 Document Reviewed: 05/21/2013 Elsevier Interactive Patient Education [employer].

## 2016-06-14 NOTE — Assessment & Plan Note (Addendum)
Very difficult patient. Unclear about the amount of pain he is in. He is very resistant to the idea of physical therapy and lifestyle change to lose weight. I have discussed about trying Cymbalta on top of his Norco. He agreed to this. Obtained pain contract and urine for urine drug screen. -Gave prescription for Norco 5/325 #240 for 1 month -Gave prescription for Cymbalta 30 mg daily for 1 month -Patient to return to clinic in 1 month for assessment. We will discuss about physical therapy again.

## 2016-06-14 NOTE — Progress Notes (Signed)
   Subjective:    Patient ID: George Leon, male    DOB: [birth year], 60 y.o.   MRN: [id number]  CC: pain medicine refills  HPI  Pain: Reports pain in his knees, neck, hands and lower back. Couldn't tell me where he started the most.  He rates his pain 9 out of 10. Pain medicine bring the pain down to 7 out of 10. Reports taking his medication today. He says he has been on chronic pain medication for the last 14 years.He states the pain medicine makes the pain bearable to do his IADLs and ADLs. He has been getting 240 tablets of Norco every months. Can walk about 200 ft with cane. 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 He is resistant to the idea of physical therapy. Denies changes from baseline. Reports some weakness in hands and legs, occasional numbness under his toes bilaterally.   Denies fever, night sweat, urinary incontinences, fecal incontinence & unintentional weight loss.   Review of Systems  Per history of present illness Objective:   Physical Exam Filed Vitals:   06/14/16 1523  BP: 134/90  Pulse: 99  Temp: 98.2 F (36.8 C)  TempSrc: Oral  Height: 5\' 7"  (1.702 m)  Weight: 257 lb 12.8 oz (116.937 kg)    GEN: Appears obese, NAD CVS: RRR, normal s1 and s2, no murmurs, no edema RESP: no increased work of breathing, good air movement bilaterally GI: soft, non-tender,non-distended, +BS MSK:   Knees: no effusion, overlying skin erythema bilaterally.   Right elbow: Bursitis decreased in size to ~ 3cm diameter without erythema or tenderness. NEURO: A&O x3, no gross defecits. Motor 4 out of 5 in all extremities, sensation intact over all dermatomes. Ambulates with the use of cane.      Assessment & Plan:  Chronic pain syndrome Very difficult patient. Unclear about the amount of pain he is in. He is very resistant to the idea of physical therapy and lifestyle change to lose weight. I have discussed about trying Cymbalta on top of his Norco. He agreed to this. Obtained pain contract  and urine for urine drug screen. -Gave prescription for Norco 5/325 #240 for 1 month -Gave prescription for Cymbalta 30 mg daily for 1 month -Patient to return to clinic in 1 month for assessment. We will discuss about physical therapy again.

## 2016-06-17 LAB — PAIN MGMT, PROFILE 5 W/CONF, U
AMPHETAMINES: NEGATIVE ng/mL (ref <500)
BENZODIAZEPINES: NEGATIVE ng/mL (ref <100)
Barbiturates: NEGATIVE ng/mL (ref <300)
CODEINE: NEGATIVE ng/mL (ref <50)
CREATININE: 118.5 mg/dL (ref >=20.0)
Cocaine Metabolite: NEGATIVE ng/mL (ref <150)
Hydrocodone: 1453 ng/mL — ABNORMAL HIGH (ref <50)
Hydromorphone: 152 ng/mL — ABNORMAL HIGH (ref <50)
MARIJUANA METABOLITE: NEGATIVE ng/mL (ref <20)
METHADONE METABOLITE: NEGATIVE ng/mL (ref <100)
MORPHINE: NEGATIVE ng/mL (ref <50)
Norhydrocodone: 312 ng/mL — ABNORMAL HIGH (ref <50)
OPIATES: POSITIVE ng/mL — AB (ref <100)
OXYCODONE: NEGATIVE ng/mL (ref <100)
Oxidant: NEGATIVE ug/mL (ref <200)
PH: 7.3 (ref 4.5–9.0)

## 2016-07-12 ENCOUNTER — Encounter: Payer: Self-pay | Admitting: Student

## 2016-07-12 ENCOUNTER — Ambulatory Visit (INDEPENDENT_AMBULATORY_CARE_PROVIDER_SITE_OTHER): Payer: Managed Care, Other (non HMO) | Admitting: Student

## 2016-07-12 VITALS — BP 120/81 | HR 95 | Temp 98.2°F | Wt 261.0 lb

## 2016-07-12 DIAGNOSIS — R52 Pain, unspecified: Secondary | ICD-10-CM

## 2016-07-12 DIAGNOSIS — M150 Primary generalized (osteo)arthritis: Secondary | ICD-10-CM

## 2016-07-12 DIAGNOSIS — I10 Essential (primary) hypertension: Secondary | ICD-10-CM

## 2016-07-12 DIAGNOSIS — E785 Hyperlipidemia, unspecified: Secondary | ICD-10-CM | POA: Diagnosis not present

## 2016-07-12 DIAGNOSIS — M159 Polyosteoarthritis, unspecified: Secondary | ICD-10-CM

## 2016-07-12 DIAGNOSIS — E7800 Pure hypercholesterolemia, unspecified: Secondary | ICD-10-CM

## 2016-07-12 DIAGNOSIS — G894 Chronic pain syndrome: Secondary | ICD-10-CM

## 2016-07-12 DIAGNOSIS — K219 Gastro-esophageal reflux disease without esophagitis: Secondary | ICD-10-CM

## 2016-07-12 MED ORDER — ATORVASTATIN CALCIUM 80 MG PO TABS: 80.0000 mg | ORAL_TABLET | Freq: Every day | ORAL | 3 refills | Status: DC

## 2016-07-12 MED ORDER — AMLODIPINE BESYLATE 10 MG PO TABS: 5.0000 mg | ORAL_TABLET | Freq: Every day | ORAL | 1 refills | Status: DC

## 2016-07-12 MED ORDER — HYDROCODONE-ACETAMINOPHEN 5-325 MG PO TABS: 2.0000 | ORAL_TABLET | Freq: Four times a day (QID) | ORAL | 0 refills | Status: DC | PRN

## 2016-07-12 MED ORDER — DULOXETINE HCL 30 MG PO CPEP: 60.0000 mg | ORAL_CAPSULE | Freq: Every day | ORAL | 3 refills | Status: DC

## 2016-07-12 MED ORDER — ESOMEPRAZOLE MAGNESIUM 40 MG PO CPDR: 40.0000 mg | DELAYED_RELEASE_CAPSULE | Freq: Every day | ORAL | 3 refills | Status: DC | PRN

## 2016-07-12 MED ORDER — LOSARTAN POTASSIUM 100 MG PO TABS: 100.0000 mg | ORAL_TABLET | Freq: Every day | ORAL | 0 refills | Status: DC

## 2016-07-12 NOTE — Assessment & Plan Note (Addendum)
Chronic back pain with muscle spasm. No red flags. Neuro exam reassuring. I started him on Cymbalta 30 mg at previous visit. He reports bloating and diarrhea with that medication and stopped taking it about a week ago. Discussed about the side effects of chronic opiate use including increased risk of fall and addiction discussed about weight loss and exercise. He says he wants to get a small dumb bells. We have agreed to go down on his Norco to 180 tablets and increase his Cymbalta to 60 mg daily. The later could help with his generalized anxiety disorder as well.  He will return in about a month for follow-up

## 2016-07-12 NOTE — Progress Notes (Addendum)
   Subjective:    Patient ID: George Leon is a 60 y.o. old male.  CC: low back pain  HPI #Low back pain: worse for the last 5 days. He says his pain is 15 out of 10 as forced and 7 out of 10 now. Doesn't know what he did to his back. He says he may have pulled a muscle when he turned around. He points that left lower back when asked him to point at where he hurt the most. Pain radiating dow to both legs posteriorly. Denies numbness or tingling in his legs. Denies fever, sided paresthesia, urinary or fecal incontinence and night sweating.    Social History: smoking Review of Systems Per HPI Objective:   Vitals:   07/12/16 0845  BP: 120/81  Pulse: 95  Temp: 98.2 F (36.8 C)  TempSrc: Oral  SpO2: 94%  Weight: 261 lb (118.4 kg)    GEN: appears well, no apparent distress. CVS: RRR, normal s1 and s2, no murmurs, no edema RESP: no increased work of breathing, good air movement bilaterally, no crackles or wheeze GI: soft, non-tender,non-distended, +BS MSK: Walks with cane, abnormal gait with limping, No apparent lesion or swelling. Tender to palpation over the left lumbar paraspinal muscles. NEURO: alert and oriented appropriately, motor 5 out of 5 in all muscle groups of lower extremities, sensation intact in all dermatomes, patellar reflex 2+ bilaterally PSYCH: appropriate mood and affect     Assessment & Plan:  Chronic pain syndrome Chronic back pain with muscle spasm. No red flags. Neuro exam reassuring. I started him on Cymbalta 30 mg at previous visit. He reports bloating and diarrhea with that medication and stopped taking it about a week ago. Discussed about the side effects of chronic opiate use including increased risk of fall and addiction discussed about weight loss and exercise. He says he wants to get a small dumb bells. We have agreed to go down on his Norco to 180 tablets and increase his Cymbalta to 60 mg daily. The later could help with his generalized anxiety  disorder as well.  He will return in about a month for follow-up  HYPERTENSION, BENIGN Stable. Refilled his diltiazem is amlodipine and losartan today  HYPERCHOLESTEROLEMIA Refilled his Lipitor today.

## 2016-07-12 NOTE — Assessment & Plan Note (Signed)
Stable. Refilled his diltiazem is amlodipine and losartan today

## 2016-07-12 NOTE — Assessment & Plan Note (Signed)
Refilled his Lipitor today.

## 2016-07-12 NOTE — Patient Instructions (Signed)
It was great seeing you today! We have addressed the following issues today  1. Back pain: Refilled your Norco today. This medication is sedating and can increase the risk of fall.  I recommend taking your Cymbalta along with this. I have increased the dose of Cymbalta to 60 mg. Exercise and weight loss helps with back pain. You can also try icing and heating.    If we did any lab work today, and the results require attention, either me or my nurse will get in touch with you. If everything is normal, you will get a letter in mail. If you don't hear from [place] in two weeks, please give [place] a call. Otherwise, I look forward to talking with you again at our next visit. If you have any questions or concerns before then, please call the clinic at [phone].  Please bring all your medications to every doctors visit   Sign up for My Chart to have easy access to your labs results, and communication with your Primary care physician.    Please check-out at the front desk before leaving the clinic.   Take Care,

## 2016-08-16 ENCOUNTER — Ambulatory Visit (INDEPENDENT_AMBULATORY_CARE_PROVIDER_SITE_OTHER): Payer: Managed Care, Other (non HMO) | Admitting: Student

## 2016-08-16 ENCOUNTER — Telehealth: Payer: Self-pay | Admitting: *Deleted

## 2016-08-16 ENCOUNTER — Encounter: Payer: Self-pay | Admitting: Student

## 2016-08-16 VITALS — BP 129/73 | HR 100 | Temp 98.0°F | Ht 67.0 in | Wt 250.4 lb

## 2016-08-16 DIAGNOSIS — M150 Primary generalized (osteo)arthritis: Secondary | ICD-10-CM

## 2016-08-16 DIAGNOSIS — I10 Essential (primary) hypertension: Secondary | ICD-10-CM | POA: Diagnosis not present

## 2016-08-16 DIAGNOSIS — M159 Polyosteoarthritis, unspecified: Secondary | ICD-10-CM

## 2016-08-16 DIAGNOSIS — R52 Pain, unspecified: Secondary | ICD-10-CM | POA: Diagnosis not present

## 2016-08-16 DIAGNOSIS — G894 Chronic pain syndrome: Secondary | ICD-10-CM

## 2016-08-16 DIAGNOSIS — Z23 Encounter for immunization: Secondary | ICD-10-CM | POA: Diagnosis not present

## 2016-08-16 DIAGNOSIS — G8929 Other chronic pain: Secondary | ICD-10-CM

## 2016-08-16 DIAGNOSIS — Z7189 Other specified counseling: Secondary | ICD-10-CM

## 2016-08-16 MED ORDER — HYDROCODONE-ACETAMINOPHEN 5-325 MG PO TABS: 2.0000 | ORAL_TABLET | Freq: Three times a day (TID) | ORAL | 0 refills | Status: DC | PRN

## 2016-08-16 MED ORDER — HYDROCODONE-ACETAMINOPHEN 5-325 MG PO TABS: 1.0000 | ORAL_TABLET | Freq: Three times a day (TID) | ORAL | 0 refills | Status: DC | PRN

## 2016-08-16 MED ORDER — DULOXETINE HCL 60 MG PO CPEP: 60.0000 mg | ORAL_CAPSULE | Freq: Every day | ORAL | 3 refills | Status: DC

## 2016-08-16 MED ORDER — DILTIAZEM HCL 30 MG PO TABS: 30.0000 mg | ORAL_TABLET | Freq: Two times a day (BID) | ORAL | 11 refills | Status: DC

## 2016-08-16 MED ORDER — HYDROCODONE-ACETAMINOPHEN 5-325 MG PO TABS: 1.0000 | ORAL_TABLET | Freq: Four times a day (QID) | ORAL | 0 refills | Status: DC | PRN

## 2016-08-16 MED ORDER — HYDROCODONE-ACETAMINOPHEN 5-325 MG PO TABS: 2.0000 | ORAL_TABLET | Freq: Four times a day (QID) | ORAL | 0 refills | Status: DC | PRN

## 2016-08-16 MED ORDER — ASPIRIN EC 81 MG PO TBEC: 81.0000 mg | DELAYED_RELEASE_TABLET | Freq: Every day | ORAL | 11 refills | Status: DC

## 2016-08-16 NOTE — Assessment & Plan Note (Signed)
Patient basically about the same. Tolerating his Cymbalta. Reports some improvement in his pain with Cymbalta. However, he still needs his Norco. We cut down on the quantity from 240 to180 last month. I expressed my concern in regards to his recent fall in bathtub. However, patient thinks this is a mechanical incidents. He doesn't feel the pain medication has any contribution to that. He states that he didn't have for recently except this one incident. However, he reports having frequent incidence of fall in remote past.  -Advised patient to take his Norco only when pain is unbearable.  -Refilled his Norco for 3 months. However, it came to my attention that the directions on two of his prescriptions are right. Patient takes 2 tablets every 8 hours as needed. I wrote it as 1 tablet every 8 hours as needed. However, I gave him sufficient quantities. Patient to bring back the 2 prescriptions when he returns for follow-up on his other chronic issues. Then I will correct his prescriptions. -Refilled his Cymbalta today. He is tolerating the medication. -Patient to return in the next few weeks to follow-up on his other chronic conditions

## 2016-08-16 NOTE — Assessment & Plan Note (Signed)
Received his flu shot today. Emphasized about colonoscopy again

## 2016-08-16 NOTE — Patient Instructions (Addendum)
It was great seeing you today! We have addressed the following issues today  1. Chronic pain: I have refilled your prescription today. I'm really worried about the effect of this medication on you given your recent fall. I recommend you take this pain medication only when your pain is unbearable.  2. Colon cancer screening: I strongly recommend you get the colonoscopy as soon as possible 4.   Preventive health: I recommend you come back and see me to discuss about your other health issues as soon as he can    If we did any lab work today, and the results require attention, either me or my nurse will get in touch with you. If everything is normal, you will get a letter in mail. If you don't hear from [place] in two weeks, please give [place] a call. Otherwise, I look forward to talking with you again at our next visit. If you have any questions or concerns before then, please call the clinic at [phone].  Please bring all your medications to every doctors visit   Sign up for My Chart to have easy access to your labs results, and communication with your Primary care physician.    Please check-out at the front desk before leaving the clinic.   Take Care,

## 2016-08-16 NOTE — Telephone Encounter (Signed)
Patient and wife called stating that the directions for his pain medications were incorrect.  They are requesting to have new prescriptions reprinted.  They went to [facility], which [facility] one of three prescriptions.  Advised patient to bring back the other two. Per Dr. Cyndia Skeeters, he will correct the prescriptions and leave them up front for pick up. Please call patient when they are ready. George Barrow, RN

## 2016-08-16 NOTE — Telephone Encounter (Signed)
Pt called back. The quanity is right but the instructions are wrong. Insurance will only pay for half unless the instructions are written for quantity.  Please advise. Pt states a message can be left.

## 2016-08-16 NOTE — Telephone Encounter (Signed)
LVM for pt to call office to inform him of below. Zimmerman Rumple, Katelyn Broadnax D, CMA  

## 2016-08-16 NOTE — Progress Notes (Signed)
   Subjective:    Patient ID: George Leon is a 60 y.o. old male.  HPI #Chronic pain: low back, neck pain, bilateral knee pain, both ankles (right>left). These are chronic issues. No changes in his pain. Reports difficulty sleeping due to back pain. Can't sleep on bed due to his back pain without his medication. He denies orthopnea. Back and knee hurts the most. Pain "dull-ache with sharp twinge". Back pain radiates down the back of his thigh. Reports numbness or tingling in his hands and toes. I started him on Cymbalta last months. Reports tolerating the medication. He states that Cymbalta is helping with the pain.  Fell last night in bath tub. He says he slipped in bath tub. Hit the edge of tub with his left back. Denies hitting his head. Denies LOC. He says "didn't get hurt but a little bit sore over left lower back". He states that this is the only fall he had in the last 1 year.  Reports having multiple falls in remote past. He he doesn't think his pain medication has any contribution to this. He stated that his pain medication enables him help his wife cooking and doing house chores. He also stated that she could not sleep without his pain medication.  Denies fever, sided paresthesia, urinary or fecal incontinence and night sweating.   PMH: reviewed SH: Reports smoking cigarettes  Review of Systems Per HPI Objective:   Vitals:   08/16/16 0840  BP: 129/73  Pulse: 100  Temp: 98 F (36.7 C)  TempSrc: Oral  Weight: 250 lb 6.4 oz (113.6 kg)  Height: 5\' 7"  (1.702 m)    GEN: appears well, no apparent distress. CVS: RRR, normal s1 and s2 RESP: no increased work of breathing, good air movement bilaterally GI: Obese MSK: Walks with cane, abnormal gait with limping, No apparent lesion or swelling. Tender to palpation over the left lumbar paraspinal muscles. No overlying skin erythema or swelling.  NEURO: alert and oriented appropriately, motor 5 out of 5 in all muscle groups of lower  extremities, sensation intact in all dermatomes, patellar reflex 2+ bilaterally PSYCH: appropriate mood and affect     Assessment & Plan:  Chronic pain syndrome Patient basically about the same. Tolerating his Cymbalta. Reports some improvement in his pain with Cymbalta. However, he still needs his Norco. We cut down on the quantity from 240 to180 last month. I expressed my concern in regards to his recent fall in bathtub. However, patient thinks this is a mechanical incidents. He doesn't feel the pain medication has any contribution to that. He states that he didn't have for recently except this one incident. However, he reports having frequent incidence of fall in remote past.  -Advised patient to take his Norco only when pain is unbearable.  -Refilled his Norco for 3 months. However, it came to my attention that the directions on two of his prescriptions are right. Patient takes 2 tablets every 8 hours as needed. I wrote it as 1 tablet every 8 hours as needed. However, I gave him sufficient quantities. Patient to bring back the 2 prescriptions when he returns for follow-up on his other chronic issues. Then I will correct his prescriptions. -Refilled his Cymbalta today. He is tolerating the medication. -Patient to return in the next few weeks to follow-up on his other chronic conditions  Preventative health care Received his flu shot today. Emphasized about colonoscopy again

## 2016-08-16 NOTE — Telephone Encounter (Signed)
Attempted to call patient and his wife multiple times. They don't have to drive up here as long as pharmacy can fill one of the prescriptions. Patient will return to clinic for his other medical issues in few weeks. He can bring the other two prescriptions when he comes back for his visit with me, and we will fix his prescriptions at that time. The quantity on prescription is right. He should take two tablets every 8 hours as needed for pain.

## 2016-08-19 ENCOUNTER — Other Ambulatory Visit: Payer: Self-pay | Admitting: Student

## 2016-08-19 DIAGNOSIS — M150 Primary generalized (osteo)arthritis: Principal | ICD-10-CM

## 2016-08-19 DIAGNOSIS — M159 Polyosteoarthritis, unspecified: Secondary | ICD-10-CM

## 2016-08-19 MED ORDER — HYDROCODONE-ACETAMINOPHEN 5-325 MG PO TABS
2.0000 | ORAL_TABLET | Freq: Three times a day (TID) | ORAL | 0 refills | Status: DC | PRN
Start: 2016-08-19 — End: 2016-11-14

## 2016-08-19 MED ORDER — HYDROCODONE-ACETAMINOPHEN 5-325 MG PO TABS: 2.0000 | ORAL_TABLET | Freq: Three times a day (TID) | ORAL | 0 refills | Status: DC | PRN

## 2016-08-19 NOTE — Telephone Encounter (Signed)
Called and talked to patient about the following issues:  -Allergy to aspirin: I received a letter from [facility] about a concern of allergic reaction to aspirin. Patient denies having allergy to aspirin. He stated that he has used aspirin in the past without any problem. However, he reports having GI issues with high dose of ibuprofen in the past.   -Prescription for Norco: There is a mistake on the direction when I printed his prescription for Norco at his last visit. I apologized about this. Patient took one of the prescriptions to [place] on [facility] but didn't fill. I have called [place] and talked to [facility] and advised her to cancel the prescription.  Patient will turn in two of the three prescriptions to the front desk when he comes in to pick up his prescriptions. I have placed 3 prescriptions for Norco 5/325 mg, quantity 180 with a direction to take 2 tablets every 8 hours as needed for pain. I have also put a sticker note on prescription to make sure he turns in the two prescriptions.

## 2016-09-09 ENCOUNTER — Ambulatory Visit: Payer: Managed Care, Other (non HMO) | Admitting: Student

## 2016-09-13 ENCOUNTER — Ambulatory Visit (INDEPENDENT_AMBULATORY_CARE_PROVIDER_SITE_OTHER): Payer: Managed Care, Other (non HMO) | Admitting: Student

## 2016-09-13 ENCOUNTER — Encounter: Payer: Self-pay | Admitting: Student

## 2016-09-13 VITALS — BP 143/85 | HR 86 | Temp 98.3°F | Ht 67.0 in | Wt 259.4 lb

## 2016-09-13 DIAGNOSIS — Z0000 Encounter for general adult medical examination without abnormal findings: Secondary | ICD-10-CM

## 2016-09-13 DIAGNOSIS — F17210 Nicotine dependence, cigarettes, uncomplicated: Secondary | ICD-10-CM

## 2016-09-13 DIAGNOSIS — I272 Pulmonary hypertension, unspecified: Secondary | ICD-10-CM

## 2016-09-13 DIAGNOSIS — I739 Peripheral vascular disease, unspecified: Secondary | ICD-10-CM

## 2016-09-13 DIAGNOSIS — I10 Essential (primary) hypertension: Secondary | ICD-10-CM

## 2016-09-13 DIAGNOSIS — J449 Chronic obstructive pulmonary disease, unspecified: Secondary | ICD-10-CM

## 2016-09-13 DIAGNOSIS — E785 Hyperlipidemia, unspecified: Secondary | ICD-10-CM | POA: Insufficient documentation

## 2016-09-13 LAB — BASIC METABOLIC PANEL WITH GFR
BUN: 6 mg/dL — ABNORMAL LOW (ref 7–25)
CALCIUM: 9 mg/dL (ref 8.6–10.3)
CO2: 26 mmol/L (ref 20–31)
Chloride: 101 mmol/L (ref 98–110)
Creat: 0.63 mg/dL — ABNORMAL LOW (ref 0.70–1.33)
Glucose, Bld: 102 mg/dL — ABNORMAL HIGH (ref 65–99)
POTASSIUM: 3.3 mmol/L — AB (ref 3.5–5.3)
SODIUM: 136 mmol/L (ref 135–146)

## 2016-09-13 LAB — LIPID PANEL
CHOL/HDL RATIO: 3.3 ratio (ref <=5.0)
CHOLESTEROL: 127 mg/dL (ref 125–200)
HDL: 38 mg/dL — AB (ref >=40)
LDL Cholesterol: 76 mg/dL (ref <130)
Triglycerides: 66 mg/dL (ref <150)
VLDL: 13 mg/dL (ref <30)

## 2016-09-13 LAB — TSH: TSH: 1.22 mIU/L (ref 0.40–4.50)

## 2016-09-13 NOTE — Patient Instructions (Signed)
It was great seeing you today! We have addressed the following issues today  1. Screening for lung cancer: I have ordered a CAT scan. Please go to [facility] hospital and have this done 2. Shortness of breath/COPD: I have ordered a referral to lung doctors. Someone will get in touch with you in the next 2 weeks 3. Pulmonary hypertension: I have ordered an ultrasound of the heart. You can have this done at [facility]. Depending on what this shows, I may order referral to cardiologist 4. Colon cancer screening: I strongly recommend that you get your colonoscopy done 5.   Smoking: I strongly recommend that you quit smoking. Call 1800-QUIT-NOW for help with stopping smoking.    If we did any lab work today, and the results require attention, either me or my nurse will get in touch with you. If everything is normal, you will get a letter in mail. If you don't hear from [place] in two weeks, please give [place] a call. Otherwise, I look forward to talking with you again at our next visit. If you have any questions or concerns before then, please call the clinic at [phone].  Please bring all your medications to every doctors visit   Sign up for My Chart to have easy access to your labs results, and communication with your Primary care physician.    Please check-out at the front desk before leaving the clinic.   Take Care,

## 2016-09-13 NOTE — Progress Notes (Signed)
Subjective:    Patient ID: George Leon, male    DOB: Aug 20, 1956, 60 y.o.   MRN: [id number]  CC:   HPI Patient here for annual physical. He continues to talk about his chronic pain. However, he is willing to focus on his other medical conditions as there is no changes to his pain. He is already on chronic pain management.   Screening Cancer Colon Cancer Screening: Declines family history of colon cancer. He continues to be resistant to colonoscopy Lung cancer: Greater than 40-pack-year smoking history. Hasn't gotten his low-dose CT yet  Chronic dis COPD: This is unclear. His spirometry from 2 years ago shows restrictive lung pattern. He never had formal PFT.  PAH: Echo in 11/2015 with PAPP of 48 mmHg and increased CVP.   Infection HCV: Up-to-date STI: HIV screening negative  Psych/Social Depression: PHQ-2: 0 EtOH: Social Tobacco use: >40 pack-year Drug use: Negative Exercise: Negative Sexual activity: Monogamous  Immunizations  Influenza: Up-to-date  Pneumococcal: hasn't gotten one yet (smoker and? COPD)  Review of Systems  Constitutional: Negative for fatigue and fever.  HENT: Negative for congestion.   Respiratory: Positive for shortness of breath. Negative for cough.   Gastrointestinal: Negative for abdominal pain.  Genitourinary: Negative for dysuria and frequency.  Musculoskeletal: Positive for arthralgias and gait problem.  Skin: Negative for rash.  Neurological: Negative for headaches.  Hematological: Negative for adenopathy. Does not bruise/bleed easily.  Psychiatric/Behavioral: Negative for confusion and sleep disturbance. The patient is not nervous/anxious.    Objective:   Physical Exam Vitals:   09/13/16 0833  BP: (!) 143/85  Pulse: 86  Temp: 98.3 F (36.8 C)  TempSrc: Oral  Weight: 259 lb 6.4 oz (117.7 kg)  Height: 5\' 7"  (1.702 m)    General: Alert and oriented. Pleasant and cooperative. Obese Head: Normocephalic and  atraumatic. Eyes: Without icterus, sclera clear and conjunctiva pink.  Ears: Normal auditory acuity. TMs and ear canals normal Oropharynx: No dentition Cardiovascular: S1, S2 present without murmurs. Extremities without clubbing or edema. Faint dorsalis pedis and posterior tibialis pulses.  Respiratory: Clear to auscultation bilaterally. No wheezes, rales, or rhonchi. No distress.  Gastrointestinal: Obese and difficult exam. Bowel sounds present and normal. No tenderness to palpation  Genitourinary: Deferred  Musculoskalatal: Symmetrical without gross deformities. Skin: Intact without significant lesions or rashes. Neurologic: Alert and oriented x4; grossly normal. Psych: Alert and cooperative. Normal mood and affect. Heme/Lymph/Immune: No excessive bruising noted    Assessment & Plan:  Preventative health care Low-dose CT chest for lung cancer screening. He has greater than 40 pack-year history of smoking. I again emphasized about colonoscopy. He continues to be resistant to this. We will discuss about pneumonia vaccine next visit given history of smoking and COPD I have emphasized the importance of weight loss and smoking cessation. Gave him quit number.  Otherwise up-to-date  COPD (chronic obstructive pulmonary disease) ([place]) Spirometry in 2015 showed restrictive pattern. He definitely have significant risk factor for COPD given greater than 40-pack-year smoking. Continue medications as they are. Referral to pulmonologist for PFT to fully characterize his lung condition. Again I strongly recommended smoking cessation.  Pulmonary hypertension Echo in 11/2015 with PAPP of 48 mmHg an elevated CVP suggestive for pulmonary hypertension. This could be secondary to his lung condition. I believe he benefit from repeat echo as he continues to endorse shortness of breath. If no improvement, it is reasonable to consider referral to cardiologist.  Peripheral vascular  disease Significant risk factor for PAD. Exam  remarkable for faint pulses in his legs. He is already on high-dose statin and aspirin. Could benefit from ABI. Will discuss this in the future.

## 2016-09-13 NOTE — Assessment & Plan Note (Signed)
Significant risk factor for PAD. Exam remarkable for faint pulses in his legs. He is already on high-dose statin and aspirin. Could benefit from ABI. Will discuss this in the future.

## 2016-09-13 NOTE — Assessment & Plan Note (Signed)
Low-dose CT chest for lung cancer screening. He has greater than 40 pack-year history of smoking. I again emphasized about colonoscopy. He continues to be resistant to this. We will discuss about pneumonia vaccine next visit given history of smoking and COPD I have emphasized the importance of weight loss and smoking cessation. Gave him quit number.  Otherwise up-to-date

## 2016-09-13 NOTE — Assessment & Plan Note (Signed)
Echo in 11/2015 with PAPP of 48 mmHg an elevated CVP suggestive for pulmonary hypertension. This could be secondary to his lung condition. I believe he benefit from repeat echo as he continues to endorse shortness of breath. If no improvement, it is reasonable to consider referral to cardiologist.

## 2016-09-13 NOTE — Assessment & Plan Note (Signed)
Spirometry in 2015 showed restrictive pattern. He definitely have significant risk factor for COPD given greater than 40-pack-year smoking. Continue medications as they are. Referral to pulmonologist for PFT to fully characterize his lung condition. Again I strongly recommended smoking cessation.

## 2016-10-21 ENCOUNTER — Institutional Professional Consult (permissible substitution): Payer: Managed Care, Other (non HMO) | Admitting: Pulmonary Disease

## 2016-10-31 IMAGING — CR DG LUMBAR SPINE COMPLETE 4+V
5 series · 5 of 5 positions shown · non-contrast
Comparison: Lumbar spine series of January 21, 2011

CLINICAL DATA: Low back pain without sciatica, no report of injury.

EXAM:
LUMBAR SPINE - COMPLETE 4+ VIEW

[l-spine ap]
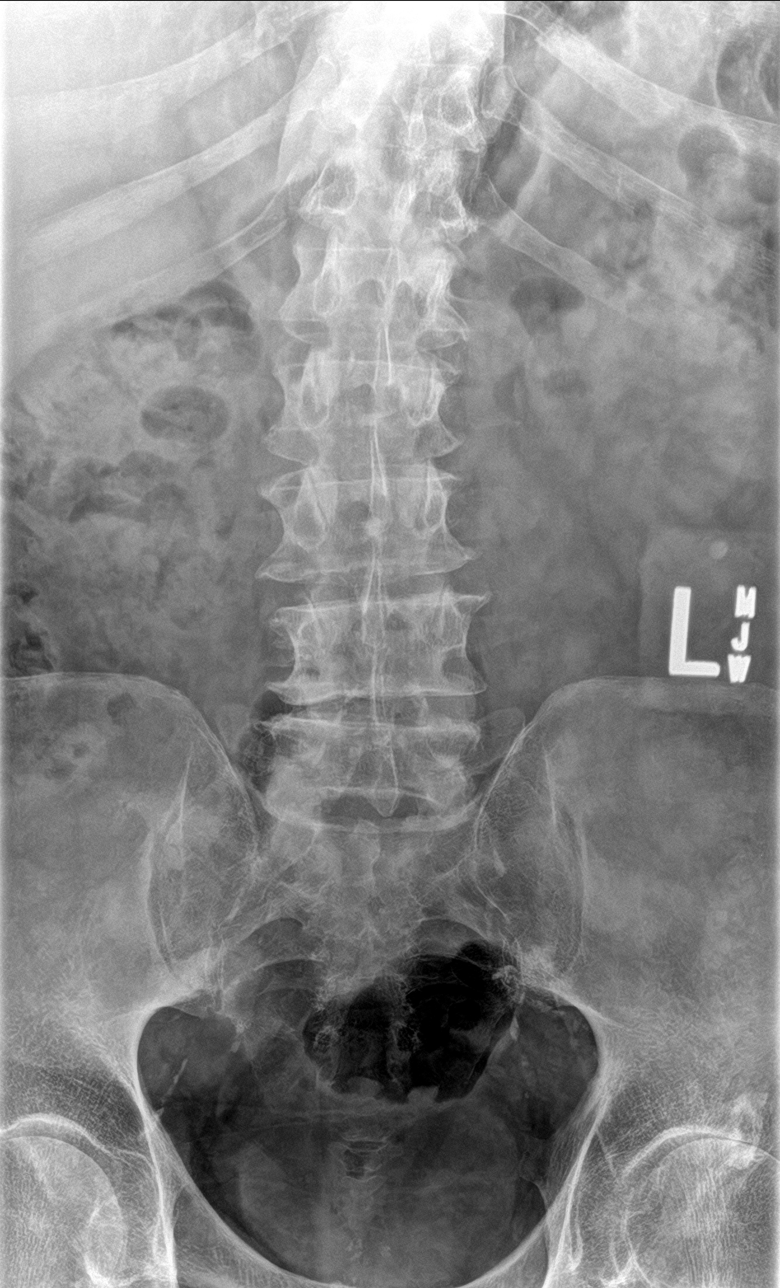

[l-spine obl (1 of 2)]
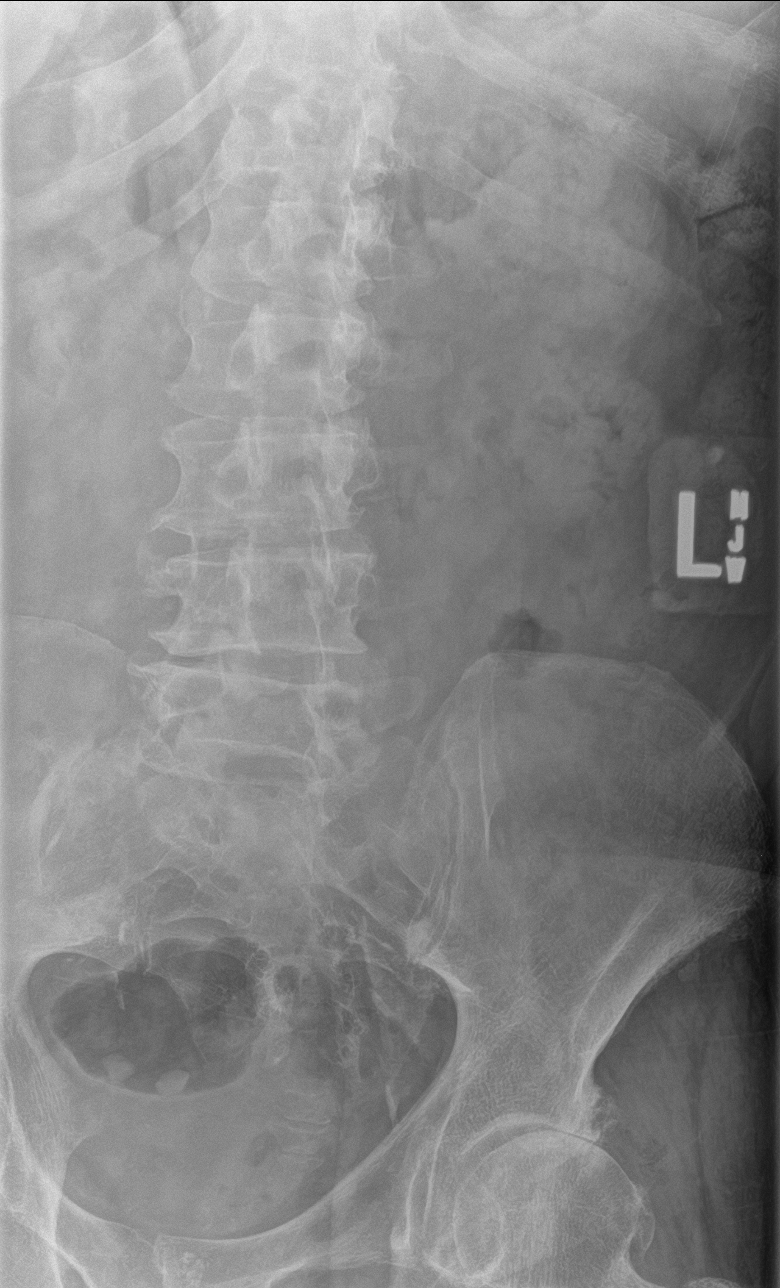

[l-spine obl (2 of 2)]
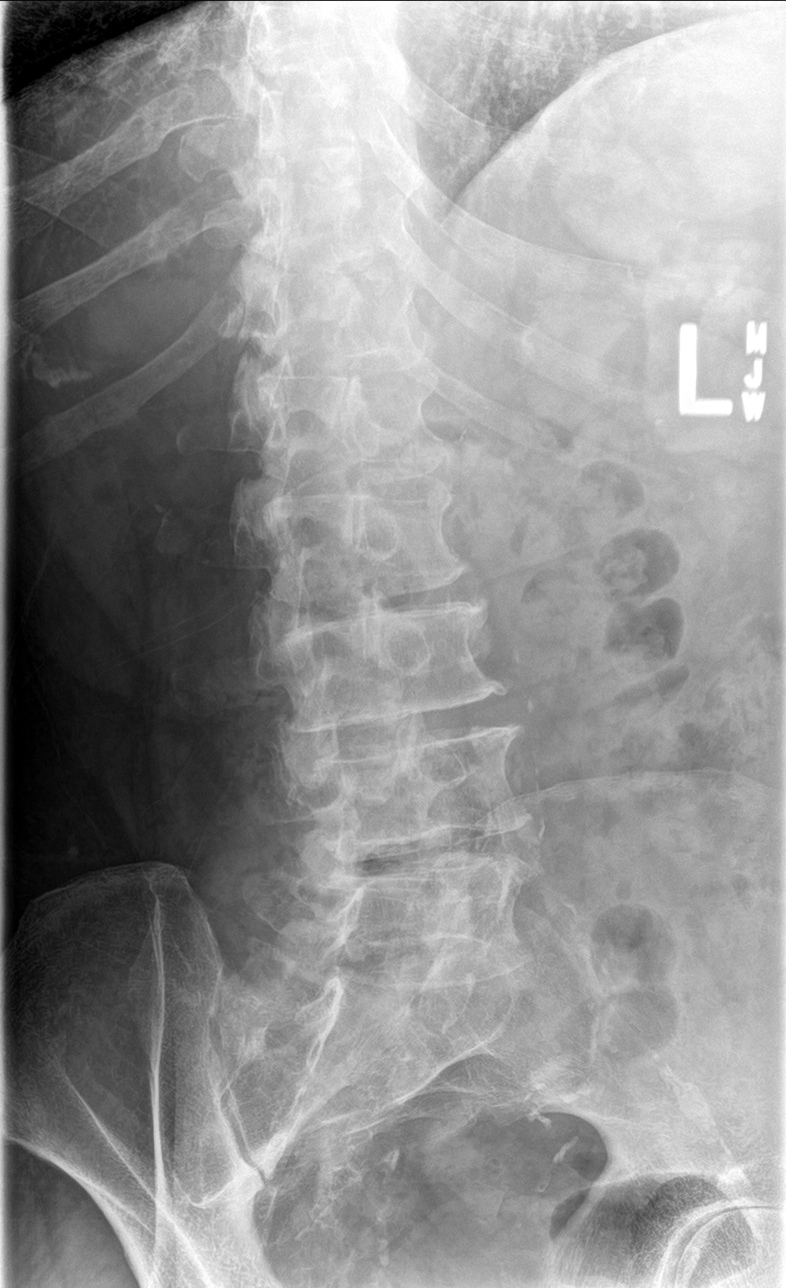

[l-spine lat]
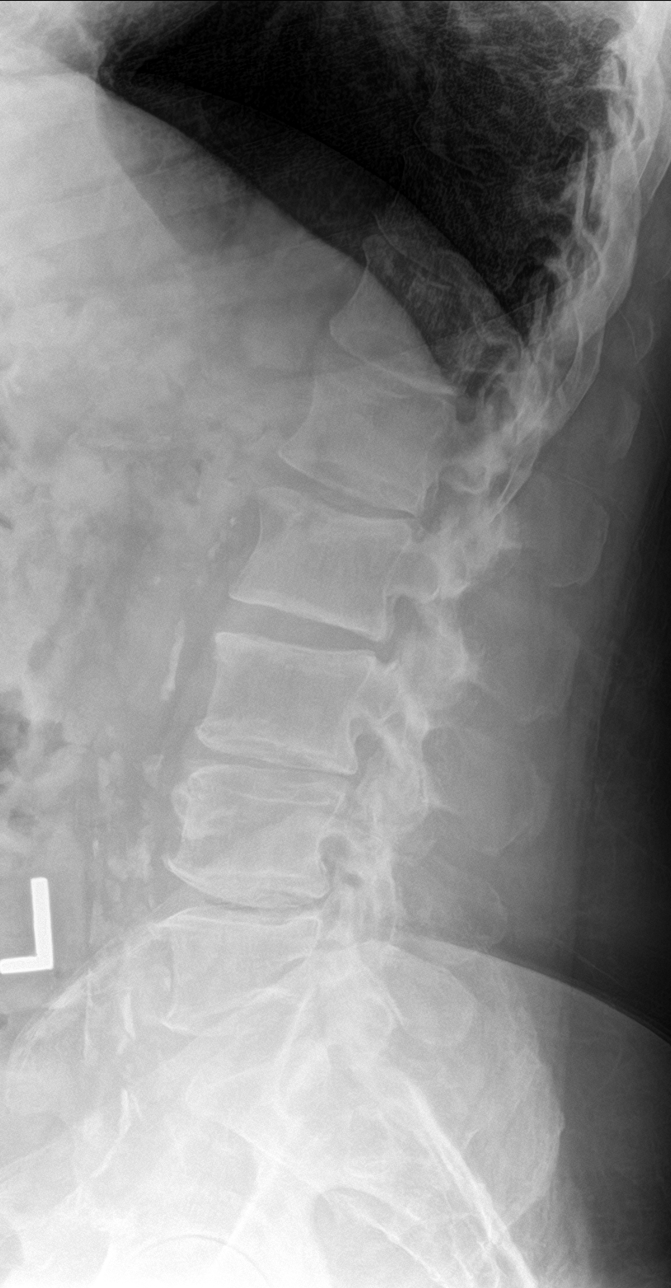

[l-spine spot]
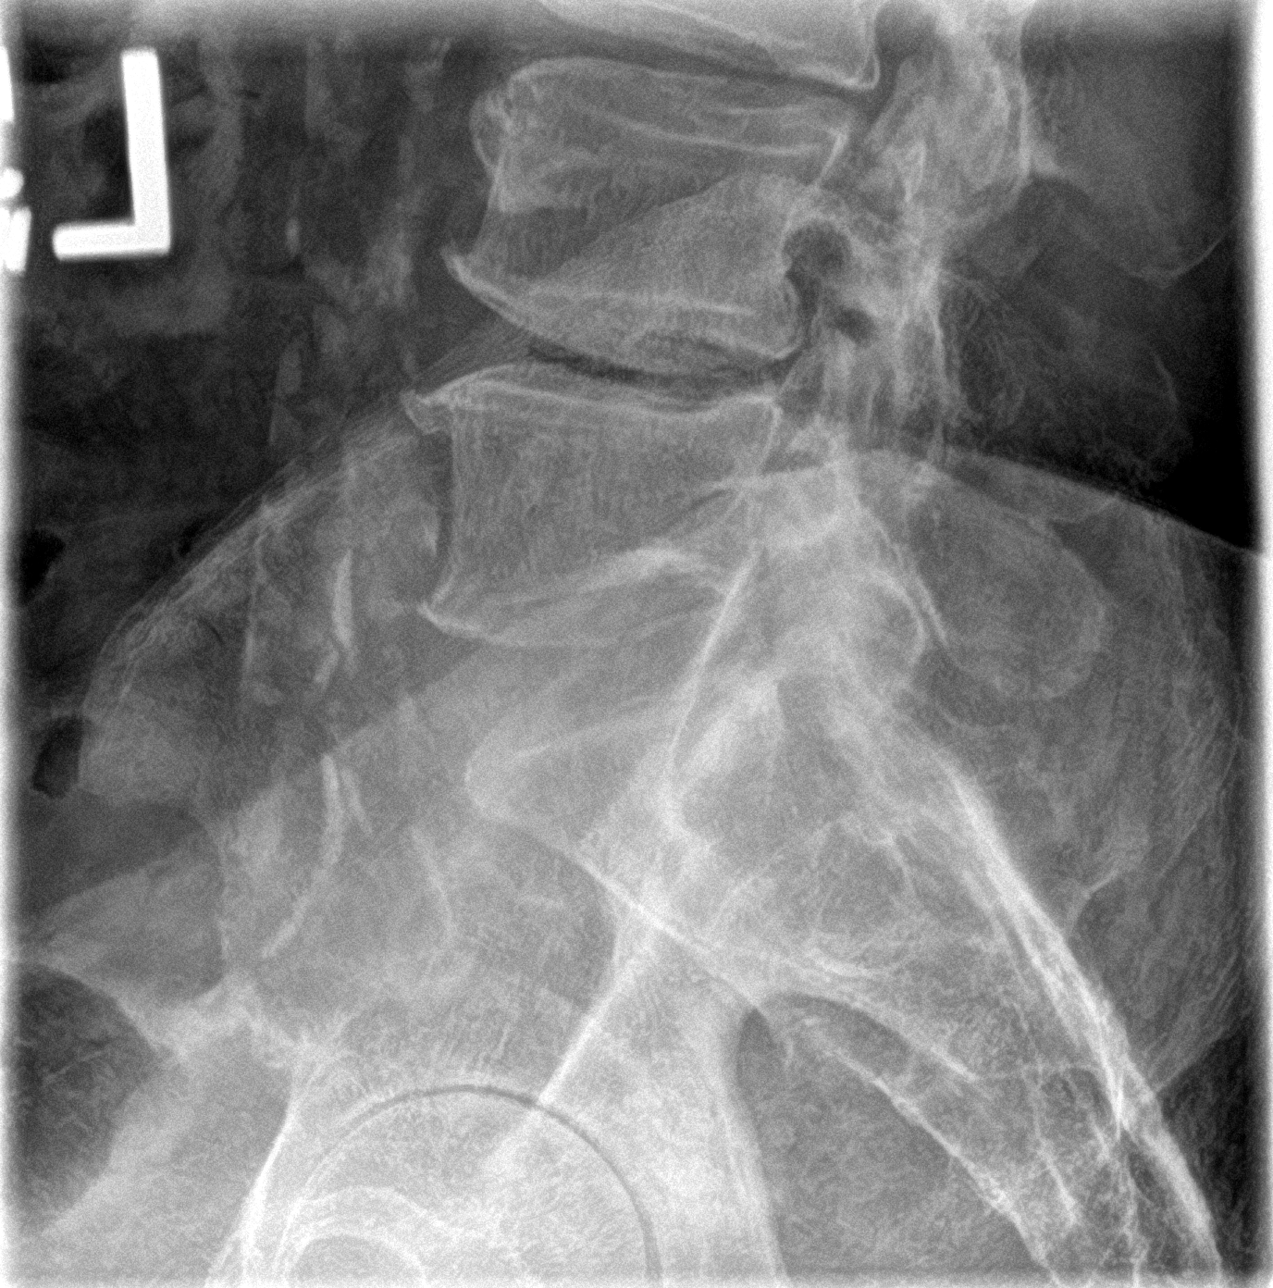

[5 of 5 positions shown; findings below may reference images not displayed]

FINDINGS: The lumbar vertebral bodies are preserved in height. There is
moderate disc space narrowing at L3-4 and L4-5 with milder narrowing
at L1-2 and L2-3. There is no spondylolisthesis. There is mild facet
joint hypertrophy at L5-S1. Small anterior endplate osteophytes are
noted at multiple levels. The pedicles and transverse processes are
intact. The observed portions of the sacrum are normal.
IMPRESSION: There is mild multilevel degenerative disc disease of the lumbar
spine. There is no compression fracture nor other acute bony
abnormality. There has not been significant interval change since
the study January 21, 2011.

## 2016-11-14 ENCOUNTER — Encounter: Payer: Self-pay | Admitting: Student

## 2016-11-14 ENCOUNTER — Ambulatory Visit (INDEPENDENT_AMBULATORY_CARE_PROVIDER_SITE_OTHER): Payer: Managed Care, Other (non HMO) | Admitting: Student

## 2016-11-14 VITALS — BP 146/80 | HR 100 | Temp 98.1°F | Wt 259.0 lb

## 2016-11-14 DIAGNOSIS — R0600 Dyspnea, unspecified: Secondary | ICD-10-CM

## 2016-11-14 DIAGNOSIS — I10 Essential (primary) hypertension: Secondary | ICD-10-CM

## 2016-11-14 DIAGNOSIS — G894 Chronic pain syndrome: Secondary | ICD-10-CM | POA: Diagnosis not present

## 2016-11-14 DIAGNOSIS — M159 Polyosteoarthritis, unspecified: Secondary | ICD-10-CM

## 2016-11-14 DIAGNOSIS — I5032 Chronic diastolic (congestive) heart failure: Secondary | ICD-10-CM

## 2016-11-14 DIAGNOSIS — M150 Primary generalized (osteo)arthritis: Secondary | ICD-10-CM

## 2016-11-14 DIAGNOSIS — J449 Chronic obstructive pulmonary disease, unspecified: Secondary | ICD-10-CM

## 2016-11-14 MED ORDER — HYDROCODONE-ACETAMINOPHEN 5-325 MG PO TABS: 2.0000 | ORAL_TABLET | Freq: Three times a day (TID) | ORAL | 0 refills | Status: DC | PRN

## 2016-11-14 MED ORDER — LOSARTAN POTASSIUM 100 MG PO TABS: 100.0000 mg | ORAL_TABLET | Freq: Every day | ORAL | 0 refills | Status: DC

## 2016-11-14 MED ORDER — DILTIAZEM HCL 30 MG PO TABS: 30.0000 mg | ORAL_TABLET | Freq: Two times a day (BID) | ORAL | 11 refills | Status: DC

## 2016-11-14 MED ORDER — ALBUTEROL SULFATE HFA 108 (90 BASE) MCG/ACT IN AERS: 1.0000 | INHALATION_SPRAY | RESPIRATORY_TRACT | 2 refills | Status: DC | PRN

## 2016-11-14 MED ORDER — AMLODIPINE BESYLATE 10 MG PO TABS: 5.0000 mg | ORAL_TABLET | Freq: Every day | ORAL | 1 refills | Status: DC

## 2016-11-14 NOTE — Patient Instructions (Addendum)
It was great seeing you today! We have addressed the following issues today  1. Back pain: I refilled his prescriptions today. I also recommend losing weights. Please refer to the dietary and exercise recommendation.  2.  Swelling in your leg: please back and see [place] if this continues to be a problem or if you have chest pain, trouble breathing or other symptoms concerning to you. Please make sure you go to your appointment for your echocardiogram as well.    If we did any lab work today, and the results require attention, either me or my nurse will get in touch with you. If everything is normal, you will get a letter in mail. If you don't hear from [place] in two weeks, please give [place] a call. Otherwise, we look forward to seeing you again at your next visit. If you have any questions or concerns before then, please call the clinic at [phone].   Please bring all your medications to every doctors visit   Sign up for My Chart to have easy access to your labs results, and communication with your Primary care physician.     Please check-out at the front desk before leaving the clinic.   Portion Size    Choose healthier foods such as 100% whole grains, vegetables, fruits, beans, nut seeds, olive oil, most vegetable oils, fat-free dietary, wild game and fish.   Avoid sweet tea, other sweetened beverages, soda, fruit juice, cold cereal and milk and trans fat.   Exercise at least 150 minutes per week, including weight resistance exercises 3 or 4 times per week.   Try to lose at least 7-10% of your current body weight.

## 2016-11-14 NOTE — Progress Notes (Signed)
   Subjective:    Patient ID: George Leon, male    DOB: [birth year], 60 y.o.   MRN: [id number]  CC: patient here to follow up on his chronic pain  HPI  Chronic neck, knee and lower back pain: these are chronic. No acute changes.  Patient never been a good historian to give clear history. He hardly characterize his pains other than saying they hurt a lot. He says he can't move without pain medicine.   He rates 7 out of 10 today. Denies recent fall. He states the pain medicine makes the pain bearable to do his IADLs and ADLs. He continues to resist physical  Therapy. Walking limited by swelling in his legs. He has history of venous insufficiency and wears compression stocking. Denies chest pain. Shortness of breath at baseline. Uses two pillows for his chronic neck issue.  Denies fever, night sweat, urinary incontinences, fecal incontinence & unintentional weight loss.  Review of Systems  Per history of present illness Objective:   Physical Exam Vitals:   11/14/16 1610  BP: (!) 146/80  Pulse: 100  Temp: 98.1 F (36.7 C)  TempSrc: Oral  SpO2: 95%  Weight: 259 lb (117.5 kg)    GEN: obese, NAD. Difficult exam.  CVS: RRR, normal s1 and s2, no murmurs, trace edema (hard to assess with compression stocking) RESP: no increased work of breathing, good air movement bilaterally, no crackles or wheeze GI: soft, non-tender,non-distended, +BS MSK:   Knees: no effusion, overlying skin erythema bilaterally.   Back: diffusely tender to palpation over lumbar paraspinal muscles.  NEURO: A&O x3, no gross defecits. Motor 4 out of 5 in all extremities, sensation intact over all dermatomes. Ambulates with the use of cane.      Assessment & Plan:  Chronic pain syndrome Stable. No red flags.  Refilled his Norco 5/325 #180. Take two tablets every 8 hours as needed for pain.  Gave 3 month supply.   Diastolic CHF ([facility]) Shortness of breath likely due to obesity. Also heavy smoker. Cardiopulmonary exam  within normal. Ordered BMP and BNP but couldn't wait to have his labs drawn as his wife has to leave for work. Likes to come back for lab draw in 4 days. Changed to future order. Still didn't get his Echocardiogram done.   HYPERTENSION, BENIGN Stable. Refilled his meds (Amlodipine, diltiazem and losartan) BMP  COPD (chronic obstructive pulmonary disease) ([place]) Refilled his albuterol today. Again advice to quit smoking. Rescheduled his low dose CT again

## 2016-11-15 NOTE — Assessment & Plan Note (Signed)
Stable. Refilled his meds (Amlodipine, diltiazem and losartan) BMP

## 2016-11-15 NOTE — Assessment & Plan Note (Signed)
Refilled his albuterol today. Again advice to quit smoking. Rescheduled his low dose CT again

## 2016-11-15 NOTE — Assessment & Plan Note (Addendum)
Shortness of breath likely due to obesity. Also heavy smoker. Cardiopulmonary exam within normal. Ordered BMP and BNP but couldn't wait to have his labs drawn as his wife has to leave for work. Likes to come back for lab draw in 4 days. Changed to future order. Still didn't get his Echocardiogram done.

## 2016-11-15 NOTE — Assessment & Plan Note (Signed)
Stable. No red flags.  Refilled his Norco 5/325 #180. Take two tablets every 8 hours as needed for pain.  Gave 3 month supply.

## 2016-11-18 ENCOUNTER — Encounter: Payer: Self-pay | Admitting: Pulmonary Disease

## 2016-11-18 ENCOUNTER — Ambulatory Visit (INDEPENDENT_AMBULATORY_CARE_PROVIDER_SITE_OTHER): Payer: Managed Care, Other (non HMO) | Admitting: Pulmonary Disease

## 2016-11-18 VITALS — BP 120/80 | HR 98 | Ht 67.5 in | Wt 261.2 lb

## 2016-11-18 DIAGNOSIS — F17210 Nicotine dependence, cigarettes, uncomplicated: Secondary | ICD-10-CM

## 2016-11-18 DIAGNOSIS — J449 Chronic obstructive pulmonary disease, unspecified: Secondary | ICD-10-CM

## 2016-11-18 MED ORDER — FLUTICASONE-UMECLIDIN-VILANT 100-62.5-25 MCG/INH IN AEPB: 1.0000 | INHALATION_SPRAY | Freq: Every day | RESPIRATORY_TRACT | 2 refills | Status: DC

## 2016-11-18 NOTE — Patient Instructions (Signed)
We'll start the trelegy inhaler. Use this instead of the symbicort Schedule PFTs  Return after PFTs for review

## 2016-11-18 NOTE — Progress Notes (Signed)
George Leon    [id number]    July 06, 1956  Primary Care Physician:George Bettina Gavia, MD  Referring Physician: Mercy Riding, MD [address] [place], [place] [address]  Chief complaint:  Consult for evaluation of pneumonia.  HPI: [name] is a 60 year old with past medical history of COPD, CHF, active smoker. He was diagnosed with COPD in 2008. He has been maintained on Symbicort for the past several years. Hospitalized in December 2016 for viral infection, CAP, COPD, CHF exacerbation and was on ventilator briefly.  His chief complaints are dyspnea on exertion, nonproductive cough. He denies any wheezing, chest pain, palpitation, fevers, chills. He has a significant smoking history and continues to smoke 1 pack per day.  Outpatient Encounter Prescriptions as of 11/18/2016  Medication Sig  . albuterol (PROAIR HFA) 108 (90 Base) MCG/ACT inhaler Inhale 1-2 puffs into the lungs every 4 (four) hours as needed. As needed for shortness of breath /wheeze/cough  . amLODipine (NORVASC) 10 MG tablet Take 0.5 tablets (5 mg total) by mouth daily.  Marland Kitchen aspirin EC 81 MG tablet Take 1 tablet (81 mg total) by mouth daily.  Marland Kitchen atorvastatin (LIPITOR) 80 MG tablet Take 1 tablet (80 mg total) by mouth daily.  . budesonide-formoterol (SYMBICORT) 160-4.5 MCG/ACT inhaler Inhale 2 puffs into the lungs 2 (two) times daily.  Regino Schultze Bandages & Supports (MEDICAL COMPRESSION STOCKINGS) MISC 1 each by Does not apply route once.  Marland Kitchen esomeprazole (NEXIUM) 40 MG capsule Take 1 capsule (40 mg total) by mouth daily as needed. indigestion  . fish oil-omega-3 fatty acids 1000 MG capsule Take 1 George by mouth 2 (two) times daily.  Marland Kitchen HYDROcodone-acetaminophen (NORCO) 5-325 MG tablet Take 2 tablets by mouth every 8 (eight) hours as needed for severe pain.  Marland Kitchen HYDROcodone-acetaminophen (NORCO) 5-325 MG tablet Take 2 tablets by mouth every 8 (eight) hours as needed for severe pain.  Marland Kitchen HYDROcodone-acetaminophen (NORCO) 5-325  MG tablet Take 2 tablets by mouth every 8 (eight) hours as needed.  Marland Kitchen losartan (COZAAR) 100 MG tablet Take 1 tablet (100 mg total) by mouth daily.  . Multiple Vitamins-Minerals (OCUVITE ADULT FORMULA) CAPS Take 1 capsule by mouth daily.  Marland Kitchen nystatin ointment (MYCOSTATIN) Apply 1 application topically 2 (two) times daily.  Marland Kitchen diltiazem (CARDIZEM) 30 MG tablet Take 1 tablet (30 mg total) by mouth every 12 (twelve) hours. (Patient not taking: Reported on 11/18/2016)  . DULoxetine (CYMBALTA) 60 MG capsule Take 1 capsule (60 mg total) by mouth daily.   No facility-administered encounter medications on file as of 11/18/2016.     Allergies as of 11/18/2016 - Review Complete 11/18/2016  Allergen Reaction Noted  . Adhesive [tape] Rash 10/10/2011  . Augmentin [amoxicillin-pot clavulanate] Diarrhea 04/12/2014  . Furosemide Nausea Only 02/17/2007  . Hydrochlorothiazide Other (See Comments) 02/17/2007  . Sulfamethoxazole Swelling 02/17/2007    Past Medical History:  Diagnosis Date  . Acute MI   . Acute on chronic diastolic heart failure ([place]) 12/01/2015  . Chronic pain   . Chronic pain syndrome 12/01/2015  . COPD (chronic obstructive pulmonary disease) ([place])   . Hypertension     Past Surgical History:  Procedure Laterality Date  . BACK SURGERY    . [place]  . JOINT REPLACEMENT      No family history on file.  Social History   Social History  . Marital status: Married    Spouse name: N/A  . Number of  children: N/A  . Years of education: N/A   Occupational History  . Not on file.   Social History Main Topics  . Smoking status: Current Every Day Smoker    Packs/day: 1.00    Years: 44.00    Types: Cigarettes    Last attempt to quit: 03/22/2014  . Smokeless tobacco: Never Used  . 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 Drug use: No  . Sexual activity: No   Other Topics Concern  . Not on file   Social History Narrative    . No narrative on file     Review of systems: Review of Systems  Constitutional: Negative for fever and chills.  HENT: Negative.   Eyes: Negative for blurred vision.  Respiratory: as per HPI  Cardiovascular: Negative for chest pain and palpitations.  Gastrointestinal: Negative for vomiting, diarrhea, blood per rectum. Genitourinary: Negative for dysuria, urgency, frequency and hematuria.  Musculoskeletal: Negative for myalgias, back pain and joint pain.  Skin: Negative for itching and rash.  Neurological: Negative for dizziness, tremors, focal weakness, seizures and loss of consciousness.  Endo/Heme/Allergies: Negative for environmental allergies.  Psychiatric/Behavioral: Negative for depression, suicidal ideas and hallucinations.  All other systems reviewed and are negative.   Physical Exam: Blood pressure 120/80, pulse 98, height 5' 7.5" (1.715 m), weight 261 lb 3.2 oz (118.5 kg), SpO2 94 %. Gen:      No acute distress HEENT:  EOMI, sclera anicteric Neck:     No masses; no thyromegaly Lungs:    Clear to auscultation bilaterally; normal respiratory effort CV:         Regular rate and rhythm; no murmurs Abd:      + bowel sounds; soft, non-tender; no palpable masses, no distension Ext:    No edema; adequate peripheral perfusion Skin:      Warm and dry; no rash Neuro: alert and oriented x 3 Psych: normal mood and affect  Data Reviewed: PFTs 06/19/15 FVC 1.70 [37%] FEV1 1.05 [31%) F/F 0.64 Severe obstructive lung disease.  Chest x-ray 12/06/15-stable lingular scarring. No other acute cardiac pulmonary abnormality. Images reviewed.  Assessment:  #1 COPD [name] has severe COPD based on PFTs from 2016. He is being maintained on Symbicort but has persistent symptoms of dyspnea on exertion. I'll switch this to trelegy and repeat PFTs for a more recent assessment of his lung function.  #2 Active smoker Smoking cessation was discussed with him but he is not interested in  quitting. He tried nicotine patches, Chantix, Wellbutrin in the past without success. Time spent counseling- 5 mins. He has already been referred for a low-dose screening CT of the chest by PCP.  Plan/Recommendations: - Stop symbicort, start trelegy - Smoking cessation - PFTs - Screening CT of chest (already scheduled)  Marshell Garfinkel MD [facility] Pager [phone] 11/18/2016, 4:06 PM  CC: George Riding, MD

## 2016-11-25 ENCOUNTER — Ambulatory Visit (HOSPITAL_COMMUNITY): Payer: Managed Care, Other (non HMO)

## 2016-11-28 ENCOUNTER — Ambulatory Visit (HOSPITAL_COMMUNITY)
Admission: RE | Admit: 2016-11-28 | Discharge: 2016-11-28 | Disposition: A | Discharge status: Home / Self Care | Payer: Managed Care, Other (non HMO) | Source: Ambulatory Visit | Attending: Student | Admitting: Student

## 2016-11-28 DIAGNOSIS — K760 Fatty (change of) liver, not elsewhere classified: Secondary | ICD-10-CM | POA: Diagnosis not present

## 2016-11-28 DIAGNOSIS — I2510 Atherosclerotic heart disease of native coronary artery without angina pectoris: Secondary | ICD-10-CM | POA: Insufficient documentation

## 2016-11-28 DIAGNOSIS — Z87891 Personal history of nicotine dependence: Secondary | ICD-10-CM | POA: Diagnosis not present

## 2016-11-28 DIAGNOSIS — F17210 Nicotine dependence, cigarettes, uncomplicated: Secondary | ICD-10-CM

## 2016-11-28 DIAGNOSIS — I700 Atherosclerosis of aorta: Secondary | ICD-10-CM | POA: Diagnosis not present

## 2016-11-29 ENCOUNTER — Ambulatory Visit (INDEPENDENT_AMBULATORY_CARE_PROVIDER_SITE_OTHER): Payer: Managed Care, Other (non HMO) | Admitting: Pulmonary Disease

## 2016-11-29 DIAGNOSIS — J449 Chronic obstructive pulmonary disease, unspecified: Secondary | ICD-10-CM | POA: Diagnosis not present

## 2016-11-29 LAB — PULMONARY FUNCTION TEST
DL/VA % pred: 101 %
DL/VA: 4.48 ml/min/mmHg/L
DLCO COR: 22.36 ml/min/mmHg
DLCO cor % pred: 79 %
DLCO unc % pred: 72 %
DLCO unc: 20.61 ml/min/mmHg
FEF 25-75 PRE: 1.35 L/s
FEF 25-75 Post: 1.58 L/sec
FEF2575-%Change-Post: 17 %
FEF2575-%PRED-PRE: 49 %
FEF2575-%Pred-Post: 57 %
FEV1-%Change-Post: 4 %
FEV1-%PRED-PRE: 51 %
FEV1-%Pred-Post: 54 %
FEV1-POST: 1.77 L
FEV1-Pre: 1.69 L
FEV1FVC-%Change-Post: 0 %
FEV1FVC-%Pred-Pre: 100 %
FEV6-%CHANGE-POST: 4 %
FEV6-%PRED-PRE: 53 %
FEV6-%Pred-Post: 56 %
FEV6-POST: 2.3 L
FEV6-Pre: 2.2 L
FEV6FVC-%Change-Post: 0 %
FEV6FVC-%PRED-POST: 105 %
FEV6FVC-%Pred-Pre: 105 %
FVC-%Change-Post: 4 %
FVC-%Pred-Post: 53 %
FVC-%Pred-Pre: 51 %
FVC-Post: 2.3 L
FVC-Pre: 2.21 L
POST FEV6/FVC RATIO: 100 %
PRE FEV1/FVC RATIO: 76 %
Post FEV1/FVC ratio: 77 %
Pre FEV6/FVC Ratio: 100 %
RV % pred: 183 %
RV: 3.79 L
TLC % PRED: 94 %
TLC: 6.07 L

## 2016-12-16 ENCOUNTER — Telehealth: Payer: Self-pay | Admitting: Student

## 2016-12-16 NOTE — Telephone Encounter (Signed)
Routed to PCP for advising. Feven Alderfer S Suhey Radford, CMA  

## 2016-12-16 NOTE — Telephone Encounter (Signed)
Wife called because the patient CHF and is sick with a cold. They wanted to know which OTC medication he can take. There is one out there called Coricidin and wanted to make sure it was safe for him to take. jw

## 2016-12-16 NOTE — Telephone Encounter (Signed)
Wife called because the patient has CHF and is sick with a cold. They wanted to know if the medication Coricidin can be taken. Please call them and let them know. jw

## 2016-12-16 NOTE — Telephone Encounter (Signed)
Returned call and spoke with patient. C/o 1 day hx of "scratchy throat and sniffle." Wanting to take coricidin but not affect BP. Instructed that Coricidin HBP does not contain decongestant and therefore will not affect BP. Discussed doing nasal saline lavage which patient firmly declined. Instructed to purchase coricidin that is specific to his symptoms only. Recommended increased fluid intake (water) as patient states he is not on a daily fluid restriction. Advised to call back if develops fever, cough, discolored sputum or nasal drainage. Patient states understanding.  Hubbard Hartshorn, RN, BSN

## 2016-12-20 NOTE — Progress Notes (Signed)
Called spoke with patient's spouse.  Advised of PFT results / recs as stated by PM.  Spouse verbalized understanding and denied any questions.

## 2016-12-25 ENCOUNTER — Ambulatory Visit: Payer: Managed Care, Other (non HMO) | Admitting: Pulmonary Disease

## 2017-01-10 ENCOUNTER — Ambulatory Visit (INDEPENDENT_AMBULATORY_CARE_PROVIDER_SITE_OTHER): Payer: Managed Care, Other (non HMO) | Admitting: Pulmonary Disease

## 2017-01-10 ENCOUNTER — Encounter: Payer: Self-pay | Admitting: Pulmonary Disease

## 2017-01-10 VITALS — BP 148/82 | HR 99 | Ht 67.0 in | Wt 257.0 lb

## 2017-01-10 DIAGNOSIS — J449 Chronic obstructive pulmonary disease, unspecified: Secondary | ICD-10-CM | POA: Diagnosis not present

## 2017-01-10 NOTE — Patient Instructions (Signed)
Continue terlegy inhaler as prescribed Continue to work on smoking cessation Your pulmonary function test and CT of the chest were reviewed with her today.  Return in 6 months.

## 2017-01-10 NOTE — Progress Notes (Signed)
George Leon    [id number]    Aug 10, 1956  Primary Care Physician:Taye Bettina Gavia, MD  Referring Physician: Mercy Riding, MD [address] [place], [place] [address]  Chief complaint:   Follow up for  COPD GOLD B  HPI: [name] is a 61 year old with past medical history of COPD GOLD B (CAT score, no exacerbations over past year, FEV1 54%), CHF, active smoker. He was diagnosed with COPD in 2008. He has been maintained on Symbicort for the past several years, recently started on trelegy. Hospitalized in December 2016 for viral infection, CAP, COPD, CHF exacerbation and was on ventilator briefly.  Interim History: He continues to have dyspnea on exertion at baseline which is unchanged. He has non-productive cough denies any wheezing, chest pain, palpitation, fevers. He continues to smoke 1 pack per day. He feels that his trelegy inhaler is helping with his breathing.  Outpatient Encounter Prescriptions as of 01/10/2017  Medication Sig  . albuterol (PROAIR HFA) 108 (90 Base) MCG/ACT inhaler Inhale 1-2 puffs into the lungs every 4 (four) hours as needed. As needed for shortness of breath /wheeze/cough  . amLODipine (NORVASC) 10 MG tablet Take 0.5 tablets (5 mg total) by mouth daily.  Marland Kitchen aspirin EC 81 MG tablet Take 1 tablet (81 mg total) by mouth daily.  Marland Kitchen atorvastatin (LIPITOR) 80 MG tablet Take 1 tablet (80 mg total) by mouth daily.  Regino Schultze Bandages & Supports (MEDICAL COMPRESSION STOCKINGS) MISC 1 each by Does not apply route once.  Marland Kitchen esomeprazole (NEXIUM) 40 MG capsule Take 1 capsule (40 mg total) by mouth daily as needed. indigestion  . fish oil-omega-3 fatty acids 1000 MG capsule Take 1 g by mouth 2 (two) times daily.  . Fluticasone-Umeclidin-Vilant (TRELEGY ELLIPTA) 100-62.5-25 MCG/INH AEPB Inhale 1 puff into the lungs daily.  Marland Kitchen HYDROcodone-acetaminophen (NORCO) 5-325 MG tablet Take 2 tablets by mouth every 8 (eight) hours as needed for severe pain.  Marland Kitchen  HYDROcodone-acetaminophen (NORCO) 5-325 MG tablet Take 2 tablets by mouth every 8 (eight) hours as needed for severe pain.  Marland Kitchen HYDROcodone-acetaminophen (NORCO) 5-325 MG tablet Take 2 tablets by mouth every 8 (eight) hours as needed.  Marland Kitchen losartan (COZAAR) 100 MG tablet Take 1 tablet (100 mg total) by mouth daily.  . Multiple Vitamins-Minerals (OCUVITE ADULT FORMULA) CAPS Take 1 capsule by mouth daily.  Marland Kitchen nystatin ointment (MYCOSTATIN) Apply 1 application topically 2 (two) times daily.  . [DISCONTINUED] budesonide-formoterol (SYMBICORT) 160-4.5 MCG/ACT inhaler Inhale 2 puffs into the lungs 2 (two) times daily.  . [DISCONTINUED] diltiazem (CARDIZEM) 30 MG tablet Take 1 tablet (30 mg total) by mouth every 12 (twelve) hours.  . DULoxetine (CYMBALTA) 60 MG capsule Take 1 capsule (60 mg total) by mouth daily.   No facility-administered encounter medications on file as of 01/10/2017.     Allergies as of 01/10/2017 - Review Complete 01/10/2017  Allergen Reaction Noted  . Adhesive [tape] Rash 10/10/2011  . Augmentin [amoxicillin-pot clavulanate] Diarrhea 04/12/2014  . Furosemide Nausea Only 02/17/2007  . Hydrochlorothiazide Other (See Comments) 02/17/2007  . Sulfamethoxazole Swelling 02/17/2007    Past Medical History:  Diagnosis Date  . Acute MI   . Acute on chronic diastolic heart failure ([place]) 12/01/2015  . Chronic pain   . Chronic pain syndrome 12/01/2015  . COPD (chronic obstructive pulmonary disease) ([place])   . Hypertension     Past Surgical History:  Procedure Laterality Date  . BACK SURGERY    .  [place]  . JOINT REPLACEMENT      No family history on file.  Social History   Social History  . Marital status: Married    Spouse name: N/A  . Number of children: N/A  . Years of education: N/A   Occupational History  . Not on file.   Social History Main Topics  . Smoking status: Current Every Day Smoker    Packs/day: 1.00    Years: 44.00    Types:  Cigarettes    Last attempt to quit: 03/22/2014  . Smokeless tobacco: Never Used  . 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 Drug use: No  . Sexual activity: No   Other Topics Concern  . Not on file   Social History Narrative  . No narrative on file   Review of systems: Review of Systems  Constitutional: Negative for fever and chills.  HENT: Negative.   Eyes: Negative for blurred vision.  Respiratory: as per HPI  Cardiovascular: Negative for chest pain and palpitations.  Gastrointestinal: Negative for vomiting, diarrhea, blood per rectum. Genitourinary: Negative for dysuria, urgency, frequency and hematuria.  Musculoskeletal: Negative for myalgias, back pain and joint pain.  Skin: Negative for itching and rash.  Neurological: Negative for dizziness, tremors, focal weakness, seizures and loss of consciousness.  Endo/Heme/Allergies: Negative for environmental allergies.  Psychiatric/Behavioral: Negative for depression, suicidal ideas and hallucinations.  All other systems reviewed and are negative.  Physical Exam: Blood pressure 120/80, pulse 98, height 5' 7.5" (1.715 m), weight 261 lb 3.2 oz (118.5 kg), SpO2 94 %. Gen:      No acute distress HEENT:  EOMI, sclera anicteric Neck:     No masses; no thyromegaly Lungs:    Clear to auscultation bilaterally; normal respiratory effort CV:         Regular rate and rhythm; no murmurs Abd:      + bowel sounds; soft, non-tender; no palpable masses, no distension Ext:    No edema; adequate peripheral perfusion Skin:      Warm and dry; no rash Neuro: alert and oriented x 3 Psych: normal mood and affect  Data Reviewed: PFTs 06/19/15 FVC 1.70 [37%] FEV1 1.05 [31%) F/F 0.64 Severe obstructive lung disease.  PFTs 11/29/16 FVC 2.30 (53%) FEV1 1.77 (54%) F/F 77 TLC 94% RV/TLC 195% DLCO 79% Severe obstructive disease with air trapping Minimal diffusion defect  Imaging Chest x-ray 12/06/15-stable  lingular scarring. No other acute cardiac pulmonary abnormality. Images reviewed. Low dose screening CT 11/28/16- no suspicious pulmonary nodules or masses. No mediastinal or hilar lymph nodes. Aortic and coronary atherosclerosis, hepatic steatosis. I'll images reviewed.  Assessment:  #1 COPD GOLD B Stable on trelegy. We will continue the same.  #2 Active smoker Smoking cessation was again discussed with him but he wants to quit on his own. He has tried nicotine patches, Chantix, Wellbutrin in the past without success. Time spent counseling-5 minutes Low-dose screening CT of the chest by primary care physician.  Plan/Recommendations: - Continue trelegy - Screening CT of chest  Marshell Garfinkel MD [facility] Pager [phone] 01/10/2017, 4:16 PM  CC: Mercy Riding, MD

## 2017-01-13 ENCOUNTER — Encounter: Payer: Self-pay | Admitting: Student

## 2017-01-13 ENCOUNTER — Other Ambulatory Visit: Payer: Self-pay | Admitting: Student

## 2017-01-13 DIAGNOSIS — K219 Gastro-esophageal reflux disease without esophagitis: Secondary | ICD-10-CM

## 2017-01-13 DIAGNOSIS — R52 Pain, unspecified: Secondary | ICD-10-CM

## 2017-01-13 DIAGNOSIS — E785 Hyperlipidemia, unspecified: Secondary | ICD-10-CM

## 2017-01-13 MED ORDER — DULOXETINE HCL 60 MG PO CPEP: 60.0000 mg | ORAL_CAPSULE | Freq: Every day | ORAL | 3 refills | Status: DC

## 2017-01-13 MED ORDER — ESOMEPRAZOLE MAGNESIUM 40 MG PO CPDR: 40.0000 mg | DELAYED_RELEASE_CAPSULE | Freq: Every day | ORAL | 0 refills | Status: DC | PRN

## 2017-01-13 MED ORDER — ATORVASTATIN CALCIUM 80 MG PO TABS: 80.0000 mg | ORAL_TABLET | Freq: Every day | ORAL | 3 refills | Status: DC

## 2017-01-13 NOTE — Progress Notes (Signed)
Sent refills on atorvastatin, Cymbalta and Nexium to [facility] on [address]

## 2017-02-11 ENCOUNTER — Ambulatory Visit (INDEPENDENT_AMBULATORY_CARE_PROVIDER_SITE_OTHER): Payer: Managed Care, Other (non HMO) | Admitting: Student

## 2017-02-11 ENCOUNTER — Encounter: Payer: Self-pay | Admitting: Student

## 2017-02-11 VITALS — BP 142/82 | HR 107 | Temp 98.3°F | Ht 67.0 in | Wt 255.0 lb

## 2017-02-11 DIAGNOSIS — M150 Primary generalized (osteo)arthritis: Secondary | ICD-10-CM | POA: Diagnosis not present

## 2017-02-11 DIAGNOSIS — G8929 Other chronic pain: Secondary | ICD-10-CM

## 2017-02-11 DIAGNOSIS — M25562 Pain in left knee: Secondary | ICD-10-CM

## 2017-02-11 DIAGNOSIS — M5442 Lumbago with sciatica, left side: Secondary | ICD-10-CM

## 2017-02-11 DIAGNOSIS — M1712 Unilateral primary osteoarthritis, left knee: Secondary | ICD-10-CM | POA: Diagnosis not present

## 2017-02-11 DIAGNOSIS — I10 Essential (primary) hypertension: Secondary | ICD-10-CM

## 2017-02-11 DIAGNOSIS — M159 Polyosteoarthritis, unspecified: Secondary | ICD-10-CM

## 2017-02-11 LAB — BASIC METABOLIC PANEL WITH GFR
BUN: 7 mg/dL (ref 7–25)
CHLORIDE: 102 mmol/L (ref 98–110)
CO2: 28 mmol/L (ref 20–31)
Calcium: 9.6 mg/dL (ref 8.6–10.3)
Creat: 0.56 mg/dL — ABNORMAL LOW (ref 0.70–1.33)
GFR, Est African American: >89 mL/min (ref >=60)
GFR, Est Non African American: >89 mL/min (ref >=60)
Glucose, Bld: 100 mg/dL — ABNORMAL HIGH (ref 65–99)
POTASSIUM: 3.2 mmol/L — AB (ref 3.5–5.3)
Sodium: 139 mmol/L (ref 135–146)

## 2017-02-11 MED ORDER — HYDROCODONE-ACETAMINOPHEN 5-325 MG PO TABS: 2.0000 | ORAL_TABLET | Freq: Three times a day (TID) | ORAL | 0 refills | Status: DC | PRN

## 2017-02-11 NOTE — Progress Notes (Signed)
Subjective:    George Leon is a 61 y.o. old male here for left knee pain  HPI Left knee pain: this has been there for 3-4 years. Worse for the last two months. No provoking factor. Sharp pain over lateral aspect of his knee. Radiates down to his foot laterally. Also has back pain, more on the left. Doesn't think the back pain is related to his knee. Had multiple steroid injection to his knee until it stopped helping. Last injection was more than a year ago. Denies fall or trauma. He takes Norco for his back pain which helps.   Back pain: lower back in the middle. Pain is usually sharp. Difficulty sleeping due to pain. He rates his pain 10/10 at its worst. Shoots down to his legs laterally, mostly on his left. Denies history of trauma or surgical procedure to his back. Denies fever, chills, bowel and bladder or saddle anesthesia.  PMH/Problem List: has HYPERCHOLESTEROLEMIA; OBESITY, MORBID; TOBACCO ABUSE; Preventative health care; HYPERTENSION, BENIGN; Chronic obstructive pulmonary disease ([place]); GERD; Osteoarthritis of multiple joints; GANGLION CYST, WRIST, LEFT; Disorder of SI (sacroiliac) joint; Peripheral vascular disease ([place]); Osteoarthritis of left knee; Osteoarthritis of right knee; Bilateral lower extremity edema; Diastolic CHF ([place]); Proteinuria; Macular degeneration, bilateral; Chronic venous insufficiency; Olecranon bursitis of right elbow; Tobacco abuse; Coronary artery disease involving native coronary artery of native heart without angina pectoris; Physical debility; Chronic pain syndrome; Cervical neck pain with evidence of disc disease; Left hip pain; Hyponatremia; Muscle cramps; Pulmonary hypertension; Hyperlipidemia; and Chronic back pain on his problem list.   has a past medical history of Acute MI; Acute on chronic diastolic heart failure ([place]) (12/01/2015); Chronic pain; Chronic pain syndrome (12/01/2015); COPD (chronic obstructive pulmonary disease) ([place]); and Hypertension.  FH:  No  family history on file.  [place] Social History  Substance Use Topics  . Smoking status: Current Every Day Smoker    Packs/day: 1.00    Years: 44.00    Types: Cigarettes  . Smokeless tobacco: Never Used  . Alcohol use No     Comment: 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    Review of Systems Review of systems negative except for pertinent positives and negatives in history of present illness above.     Objective:     Vitals:   02/11/17 1524  BP: (!) 142/82  Pulse: (!) 107  Temp: 98.3 F (36.8 C)  TempSrc: Oral  SpO2: 98%  Weight: 255 lb (115.7 kg)  Height: 5\' 7"  (1.702 m)    Physical Exam GEN: obese, NAD. Difficult exam.  RESP: no increased work of breathing MSK:   Limited exam due to patient's inability to cooperate with exam. Back: no apparent swelling or skin erythema. Walks with cane, antalgic gait. Diffusely tender to palpation over lumbar paraspinal muscles. No step offs. Not able to assess range of motion in his back due to poor cooperation. Knees: Both knees appear symmetric. No apparent swelling, skin erythema. No increased warmth to touch. Crepitus bilaterally. "Some tenderness to palpation over the joint lines bilaterally. No palpable effusion. Range of motion in his knees limited by pain.   NEURO: A&O x3, no gross defecits. Motor 4 out of 5 in all extremities, sensation intact over all dermatomes. Patellar reflex 2+ bilaterally.     Assessment and Plan:  Osteoarthritis of left knee Most likely due to OA. He has history bilateral knee OA. He is gait and back could have some roles to play here. Used to get steroid  injection. Last injection was about a year ago. He is not interested in steroid injection now saying it didn't work when he got it last. No signs of septic joint, crystalopathy or fracture.   Refilled his Norco. Gave three prescription for three month supply  Discussed about safe storage and disposal  Referral to orthopedic surgery  Chronic back  pain He has history of multiple joint OA. His current pain with radiation to his left foot over the lateral aspect is concerning for radiculopathy. He never had lumbar spin film or MRI. No red flags today.  Plan as above (refill pain meds and referral to ortho)     Orders Placed This Encounter  Procedures  . BASIC METABOLIC PANEL WITH GFR  . Ambulatory referral to Orthopedic Surgery    Referral Priority:   Routine    Referral Type:   Surgical    Referral Reason:   Specialty Services Required    Requested Specialty:   Orthopedic Surgery    Number of Visits Requested:   1    Return in about 3 months (around 05/14/2017) for back pain.  Mercy Riding, MD 02/12/17 Pager: [phone]

## 2017-02-11 NOTE — Patient Instructions (Signed)
It was great seeing you today! We have addressed the following issues today 1. Back and knee pain: I have ordered a referral to orthopedic surgeon. Someone will get in touch with you in the next couple of weeks about this. I have also refilled your prescriptions.   If we did any lab work today, and the results require attention, either me or my nurse will get in touch with you. If everything is normal, you will get a letter in mail and a message via . If you don't hear from [place] in two weeks, please give [place] a call. Otherwise, we look forward to seeing you again at your next visit. If you have any questions or concerns before then, please call the clinic at [phone].  Please bring all your medications to every doctors visit  Sign up for My Chart to have easy access to your labs results, and communication with your Primary care physician.    Please check-out at the front desk before leaving the clinic.    Take Care,   Dr. Cyndia Skeeters

## 2017-02-12 DIAGNOSIS — M25562 Pain in left knee: Secondary | ICD-10-CM | POA: Insufficient documentation

## 2017-02-12 DIAGNOSIS — M549 Dorsalgia, unspecified: Secondary | ICD-10-CM

## 2017-02-12 DIAGNOSIS — G8929 Other chronic pain: Secondary | ICD-10-CM | POA: Insufficient documentation

## 2017-02-12 NOTE — Assessment & Plan Note (Deleted)
Most likely due to OA. He has history bilateral knee OA. He is gait and back could have some roles to play here. Used to get steroid injection which stopped working for him. Last injection was more than a year ago. He is not interested in steroid injection now. No signs of septic joint, crystalopathy or fracture.  -Refilled his Norco.  -Referral to orthopedic surgery

## 2017-02-12 NOTE — Assessment & Plan Note (Addendum)
Most likely due to OA. He has history bilateral knee OA. He is gait and back could have some roles to play here. Used to get steroid injection. Last injection was about a year ago. He is not interested in steroid injection now saying it didn't work when he got it last. No signs of septic joint, crystalopathy or fracture.   Refilled his Norco. Gave three prescription for three month supply  Discussed about safe storage and disposal  Referral to orthopedic surgery

## 2017-02-12 NOTE — Assessment & Plan Note (Signed)
He has history of multiple joint OA. His current pain with radiation to his left foot over the lateral aspect is concerning for radiculopathy. He never had lumbar spin film or MRI. No red flags today.  Plan as above (refill pain meds and referral to ortho)

## 2017-03-03 ENCOUNTER — Ambulatory Visit (INDEPENDENT_AMBULATORY_CARE_PROVIDER_SITE_OTHER): Payer: Managed Care, Other (non HMO) | Admitting: Orthopaedic Surgery

## 2017-03-03 ENCOUNTER — Ambulatory Visit (INDEPENDENT_AMBULATORY_CARE_PROVIDER_SITE_OTHER): Payer: Managed Care, Other (non HMO)

## 2017-03-03 ENCOUNTER — Telehealth: Payer: Self-pay | Admitting: Student

## 2017-03-03 DIAGNOSIS — M545 Low back pain, unspecified: Secondary | ICD-10-CM

## 2017-03-03 DIAGNOSIS — M25562 Pain in left knee: Secondary | ICD-10-CM

## 2017-03-03 DIAGNOSIS — G8929 Other chronic pain: Secondary | ICD-10-CM

## 2017-03-03 NOTE — Progress Notes (Signed)
Office Visit Note   Patient: George Leon           Date of Birth: Nov 15, 1956           MRN: [id number] Visit Date: 03/03/2017              Requested by: Mercy Riding, MD [place], [place] [address] PCP: Mercy Riding, MD   Assessment & Plan: Visit Diagnoses:  1. Chronic pain of left knee   2. Chronic left-sided low back pain without sciatica     Plan: I do feel that he is chronically disabled from both a medical and orthopedic standpoint. He has severe COPD and still abuses tobacco. His far as his left knee goes I don't think this is a major source of issue but that he is very convinced that the knee is significant issues so we need to obtain an MRI to evaluate the knee further based on the failure conservative treatment of multiple injections over a decade but no significant findings on plain films. When I see him back of the MRI of his left knee would like a low AP pelvis and a lateral of his left hip given the arthritic changes I'm seeing on plain films of his lumbar spine. I do feel that he is a candidate for disability. He is on chronic hydrocodone says that doesn't work but he understands that I can have only other chronic pain medications and this can still be done through his primary care physician or pain specialist. I'll see him back myself in 2 weeks to see what the MRI of his knee shows and hopefully by then we'll have plain films and we can obtain that day of his pelvis and left hip.  Follow-Up Instructions: Return in about 2 weeks (around 03/17/2017).   Orders:  Orders Placed This Encounter  Procedures  . XR Knee 1-2 Views Left  . XR Lumbar Spine 2-3 Views   No orders of the defined types were placed in this encounter.     Procedures: No procedures performed   Clinical Data: No additional findings.   Subjective: No chief complaint on file. The patient is a 61 year old that I'm seeing for the first time. He has a history of chronic pain syndrome  and there is a lot of notes the [facility] system as a relates to his chronic pain. He is referred to me mainly for his left knee which he says is been hurting him for 10 years now getting worse and these had multiple steroid injections in his left knee. He also has chronic low back pain and most with a cane. His back pain is been going on since he says 1984 and when it happened on the job. He slipped while carrying some heavy products in his back continues to get worse with time. His never had surgery on his back or his knee. He is someone who is been on disability for long period time and he let me know that his attorney should be getting in touch with me this week to ask about his disability as a relates to his orthopedic issues. He does have a history of COPD and he is still a chronic smoker. Again he said history chronic pain syndrome as well. He says his knee and back hurt all the time and there is definitely something going on in both these areas. He also reports bilateral hip pain. He says that his knee is the worst pain as  well as his back.  HPI  Review of Systems He currently denies any chest pain but does report chronic wheezing and shortness of breath. Denies any fever or chills. He denies any headache, nausea, vomiting.  Objective: Vital Signs: There were no vitals taken for this visit.  Physical Exam He is alert and oriented 3 and in no acute distress and ambulates with a cane. Ortho Exam Examination of his left knee shows no effusion. There is no redness. His range of motion is full but painful. He hurts on mainly the lateral joint line and patella tendon area but there is no ligamentous instability I can feel on exam his range of motion again is full but painful. Examination of both hip show pain with internal/external rotation of both hips. He has significant limitations with flexion extension of lumbar spine. Specialty Comments:  No specialty comments available.  Imaging: Xr Knee 1-2  Views Left  Result Date: 03/03/2017 An AP and lateral left knee shows well maintained joint space. Is no acute findings. The alignment is good overall. There is only mild arthritic changes. There is no effusion.  Xr Lumbar Spine 2-3 Views  Result Date: 03/03/2017 An AP and lateral lumbar spine show significant degenerative scoliosis and multilevel degenerative disc disease and spondylosis. He can actually see significant arthritic changes in the superior lateral aspect of his left hip joint.    PMFS History: Patient Active Problem List   Diagnosis Date Noted  . Chronic back pain 02/12/2017  . Pulmonary hypertension 09/13/2016  . Hyperlipidemia 09/13/2016  . Hyponatremia 04/15/2016  . Muscle cramps 04/15/2016  . Left hip pain 03/26/2016  . Cervical neck pain with evidence of disc disease 01/18/2016  . Physical debility 12/01/2015  . Chronic pain syndrome 12/01/2015  . Tobacco abuse   . Coronary artery disease involving native coronary artery of native heart without angina pectoris   . Olecranon bursitis of right elbow 09/21/2015  . Chronic venous insufficiency 06/19/2015  . Diastolic CHF ([place]) 09/24/5101  . Proteinuria 05/22/2015  . Macular degeneration, 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  . Bilateral lower extremity edema 05/11/2015  . Osteoarthritis of left knee 12/28/2014  . Osteoarthritis of right knee 12/28/2014  . Peripheral vascular disease ([place]) 08/23/2013  . Disorder of SI (sacroiliac) joint 03/30/2013  . GANGLION CYST, WRIST, LEFT 11/16/2010  . Chronic obstructive pulmonary disease ([place]) 10/26/2010  . Osteoarthritis of multiple joints 08/25/2008  . OBESITY, MORBID 05/26/2008  . GERD 06/19/2007  . TOBACCO ABUSE 04/07/2007  . Preventative health care 04/07/2007  . HYPERCHOLESTEROLEMIA 02/16/2007  . HYPERTENSION, BENIGN 02/16/2007   Past Medical History:  Diagnosis Date  . Acute MI   . Acute on chronic diastolic heart failure ([place]) 12/01/2015  . Chronic pain   . Chronic pain  syndrome 12/01/2015  . COPD (chronic obstructive pulmonary disease) ([place])   . Hypertension     No family history on file.  Past Surgical History:  Procedure Laterality Date  . BACK SURGERY    . [place]  . JOINT REPLACEMENT     Social History   Occupational History  . Not on file.   Social History Main Topics  . Smoking status: Current Every Day Smoker    Packs/day: 1.00    Years: 44.00    Types: Cigarettes  . Smokeless tobacco: Never Used  . 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 Drug use: No  . Sexual activity:  No

## 2017-03-03 NOTE — Telephone Encounter (Signed)
Patient calls to request to speak directly to Dr. Cyndia Skeeters about an issue with his medication. Dr. Juliann Pares next appt is not until 4/6 and patient did not want to wait that long. Please call patient at [phone].

## 2017-03-04 ENCOUNTER — Other Ambulatory Visit (INDEPENDENT_AMBULATORY_CARE_PROVIDER_SITE_OTHER): Payer: Self-pay

## 2017-03-04 DIAGNOSIS — G8929 Other chronic pain: Secondary | ICD-10-CM

## 2017-03-04 DIAGNOSIS — M25562 Pain in left knee: Principal | ICD-10-CM

## 2017-03-04 NOTE — Telephone Encounter (Signed)
Called and talked to patient. He says he is applying for permanent disability. I do feel that he is a candidate for disability given his medical condition. He says his lawyer will contact me in few weeks for paper work or letter.  He appreciated the call.

## 2017-03-17 ENCOUNTER — Other Ambulatory Visit: Payer: Managed Care, Other (non HMO)

## 2017-03-18 ENCOUNTER — Other Ambulatory Visit: Payer: Self-pay | Admitting: Student

## 2017-03-18 DIAGNOSIS — K219 Gastro-esophageal reflux disease without esophagitis: Secondary | ICD-10-CM

## 2017-03-20 ENCOUNTER — Ambulatory Visit (INDEPENDENT_AMBULATORY_CARE_PROVIDER_SITE_OTHER): Payer: Managed Care, Other (non HMO) | Admitting: Orthopaedic Surgery

## 2017-03-27 ENCOUNTER — Inpatient Hospital Stay: Admission: RE | Admit: 2017-03-27 | Payer: Managed Care, Other (non HMO) | Source: Ambulatory Visit

## 2017-04-01 ENCOUNTER — Ambulatory Visit (INDEPENDENT_AMBULATORY_CARE_PROVIDER_SITE_OTHER): Payer: Managed Care, Other (non HMO) | Admitting: Orthopaedic Surgery

## 2017-04-11 ENCOUNTER — Ambulatory Visit
Admission: RE | Admit: 2017-04-11 | Discharge: 2017-04-11 | Disposition: A | Discharge status: Home / Self Care | Payer: Medicare Other | Source: Ambulatory Visit | Attending: Orthopaedic Surgery | Admitting: Orthopaedic Surgery

## 2017-04-11 DIAGNOSIS — M25562 Pain in left knee: Principal | ICD-10-CM

## 2017-04-11 DIAGNOSIS — G8929 Other chronic pain: Secondary | ICD-10-CM

## 2017-04-21 ENCOUNTER — Ambulatory Visit (INDEPENDENT_AMBULATORY_CARE_PROVIDER_SITE_OTHER): Payer: Medicare Other | Admitting: Orthopaedic Surgery

## 2017-04-21 DIAGNOSIS — S83242D Other tear of medial meniscus, current injury, left knee, subsequent encounter: Secondary | ICD-10-CM

## 2017-04-21 DIAGNOSIS — M25562 Pain in left knee: Secondary | ICD-10-CM | POA: Diagnosis not present

## 2017-04-21 DIAGNOSIS — G8929 Other chronic pain: Secondary | ICD-10-CM

## 2017-04-21 NOTE — Progress Notes (Signed)
She was, follow-up after MRI of his left knee. He's had chronic pain in that knee with locking catching and only medial joint line tenderness. Tried and failed all forms of conservative treatment.  On exam he only hurts on the medial joint line of his left knee. He has a positive Murray sign to that side as well.  MRI is reviewed and it does show a complex tear the medial meniscus of his left knee with only mild partial thickness cartilage changes of the medial compartment. His anterior cruciate ligament and PCL as well as lateral meniscus are all intact.  Given the complex nature his meniscal tear I am recommending an arthroscopic intervention for his left knee with a partial medial meniscectomy. I showed him a knee model and went over his MRI and explained in detail what the surgery involves including a thorough discussion of risk and benefits of surgery. One of his family members is with him whose had recent knee surgery as well and understands this. He does wish to have this set up given the failure conservative treatment. All questions were encouraged and answered. We'll work on getting this arthroscopy set up and it would see him at one week postoperative for suture removal.

## 2017-05-06 ENCOUNTER — Other Ambulatory Visit: Payer: Self-pay | Admitting: *Deleted

## 2017-05-06 DIAGNOSIS — E785 Hyperlipidemia, unspecified: Secondary | ICD-10-CM

## 2017-05-06 DIAGNOSIS — K219 Gastro-esophageal reflux disease without esophagitis: Secondary | ICD-10-CM

## 2017-05-06 DIAGNOSIS — I10 Essential (primary) hypertension: Secondary | ICD-10-CM

## 2017-05-06 DIAGNOSIS — R52 Pain, unspecified: Secondary | ICD-10-CM

## 2017-05-06 DIAGNOSIS — J449 Chronic obstructive pulmonary disease, unspecified: Secondary | ICD-10-CM

## 2017-05-06 MED ORDER — LOSARTAN POTASSIUM 100 MG PO TABS: 100.0000 mg | ORAL_TABLET | Freq: Every day | ORAL | 0 refills | Status: DC

## 2017-05-06 MED ORDER — DULOXETINE HCL 60 MG PO CPEP: 60.0000 mg | ORAL_CAPSULE | Freq: Every day | ORAL | 3 refills | Status: DC

## 2017-05-06 MED ORDER — ESOMEPRAZOLE MAGNESIUM 40 MG PO CPDR: DELAYED_RELEASE_CAPSULE | ORAL | 0 refills | Status: DC

## 2017-05-06 MED ORDER — AMLODIPINE BESYLATE 10 MG PO TABS: 5.0000 mg | ORAL_TABLET | Freq: Every day | ORAL | 1 refills | Status: DC

## 2017-05-06 MED ORDER — ATORVASTATIN CALCIUM 80 MG PO TABS: 80.0000 mg | ORAL_TABLET | Freq: Every day | ORAL | 3 refills | Status: DC

## 2017-05-06 MED ORDER — ALBUTEROL SULFATE HFA 108 (90 BASE) MCG/ACT IN AERS: 1.0000 | INHALATION_SPRAY | RESPIRATORY_TRACT | 2 refills | Status: DC | PRN

## 2017-05-06 NOTE — Telephone Encounter (Signed)
Refill request for 90 day supply.  Latavious Bitter L, RN  

## 2017-05-12 ENCOUNTER — Encounter: Payer: Self-pay | Admitting: Student

## 2017-05-12 ENCOUNTER — Ambulatory Visit (INDEPENDENT_AMBULATORY_CARE_PROVIDER_SITE_OTHER): Payer: Managed Care, Other (non HMO) | Admitting: Student

## 2017-05-12 VITALS — BP 144/86 | HR 98 | Temp 98.1°F | Ht 67.0 in | Wt 246.0 lb

## 2017-05-12 DIAGNOSIS — D509 Iron deficiency anemia, unspecified: Secondary | ICD-10-CM

## 2017-05-12 DIAGNOSIS — G8929 Other chronic pain: Secondary | ICD-10-CM | POA: Diagnosis not present

## 2017-05-12 DIAGNOSIS — R000 Tachycardia, unspecified: Secondary | ICD-10-CM

## 2017-05-12 DIAGNOSIS — M5442 Lumbago with sciatica, left side: Secondary | ICD-10-CM | POA: Diagnosis not present

## 2017-05-12 DIAGNOSIS — I10 Essential (primary) hypertension: Secondary | ICD-10-CM | POA: Diagnosis not present

## 2017-05-12 DIAGNOSIS — M150 Primary generalized (osteo)arthritis: Secondary | ICD-10-CM

## 2017-05-12 DIAGNOSIS — M159 Polyosteoarthritis, unspecified: Secondary | ICD-10-CM

## 2017-05-12 DIAGNOSIS — D649 Anemia, unspecified: Secondary | ICD-10-CM | POA: Diagnosis not present

## 2017-05-12 MED ORDER — HYDROCODONE-ACETAMINOPHEN 5-325 MG PO TABS: 2.0000 | ORAL_TABLET | Freq: Three times a day (TID) | ORAL | 0 refills | Status: DC | PRN

## 2017-05-12 NOTE — Patient Instructions (Addendum)
It was great seeing you today! We have addressed the following issues today 1. Back pain/knee opain: I have refilled her prescriptions. Please let me know if there is worsening of your symptoms or other symptoms concerning to you. Good luck with the knee surgery. Congratulation on your weight loss. Keep it up! 2.   Anemia: I strongly recommend you get a colonoscopy done.   If we did any lab work today, and the results require attention, either me or my nurse will get in touch with you. If everything is normal, you will get a letter in mail and a message via . If you don't hear from [place] in two weeks, please give [place] a call. Otherwise, we look forward to seeing you again at your next visit. If you have any questions or concerns before then, please call the clinic at [phone].  Please bring all your medications to every doctors visit  Sign up for My Chart to have easy access to your labs results, and communication with your Primary care physician.    Please check-out at the front desk before leaving the clinic.    Take Care,   Dr. Cyndia Skeeters             Colon Cancer  People with early colon cancer usually have no warning signs or symptoms.  If found early, most patients can be cured, but if found when it has already spread, the chance of survival is not as good.  Colon cancer is the second most common cause of concern is in the [place] with over 56,000 deaths from colon cancer in 2005  Colon cancer is a common, treatable disease. Screening tests can find a cancer  early, before you have symptoms, and make it more likely that you will survive the disease.  Who needs to be tested? If you are age 17-75 yrs, you should be tested for colon cancer.  Ways to be tested:  A colonoscopy the best test to detect colon cancer. It requires you to drink a bowel preparation to clean out your colon before the test. During this test, a tube with a camera inserted into your rectum and examines your entire  colon. You can be given medicine to make you sleepy during the exam. Therefore, you will not be able to drive immediately after the test. There is a small risk of bowel injury during the test.   Stool cards that you can take home and take a sample of your stool is another option. The cards are not as good as colonoscopy at detecting cancer, but the tests are easier and cheaper.   To schedule the colonoscopy, you can call one of the 3 options below:  [facility]. Phone number: [phone]  [facility]. Phone number: [phone]  [facility]: Phone number [phone]

## 2017-05-12 NOTE — Progress Notes (Signed)
Subjective:    George Leon is a 61 y.o. old male here for his back, hip and knee pain  HPI Back, hip and knee pain: these are chronic issues. He describe the back pain as burning and tightening. Pain radiates down to his knees posteriorly. Pain is intermittent. He reports 2-3 incidences of urinary accident in the last two months. The last one was about a month ago. Denies urinary retention or bowel incontinence. Denies fever, night sweat or saddle anesthesia or paraesthesia. He lost about 15 lbs in the last 6 months in preparation for his left hip and knee surgeries which are in two weeks. He stopped drinking soda, tea and sugary drinks. He started cooking at home. He is motivated to continue loosing weight. He hopes to be more active after the hip and knee surgeries. Denies fall. He is still taking his Cymbalta, He says this his helping with his knee pain.   Anemia: last Hgb 12.2. MCV 89. He denies hematochezia or melena. He never had colonoscopy. He resisted colonoscopy multiple times in the past. He is now considering it with the persuation from his wife.   PMH/Problem List: has HYPERCHOLESTEROLEMIA; OBESITY, MORBID; TOBACCO ABUSE; Preventative health care; HYPERTENSION, BENIGN; Chronic obstructive pulmonary disease ([place]); GERD; Osteoarthritis of multiple joints; GANGLION CYST, WRIST, LEFT; Disorder of SI (sacroiliac) joint; Peripheral vascular disease ([place]); Osteoarthritis of left knee; Osteoarthritis of right knee; Bilateral lower extremity edema; Diastolic CHF ([place]); Proteinuria; Macular degeneration, bilateral; Chronic venous insufficiency; Olecranon bursitis of right elbow; Tobacco abuse; Coronary artery disease involving native coronary artery of native heart without angina pectoris; Physical debility; Chronic pain syndrome; Cervical neck pain with evidence of disc disease; Left hip pain; Hyponatremia; Muscle cramps; Pulmonary hypertension ([place]); Hyperlipidemia; Chronic back pain; Other tear of  medial meniscus, current injury, left knee, subsequent encounter; Chronic pain of left knee; and Anemia on his problem list.   has a past medical history of Acute MI ([place]); Acute on chronic diastolic heart failure ([place]) (12/01/2015); Chronic pain; Chronic pain syndrome (12/01/2015); COPD (chronic obstructive pulmonary disease) ([place]); and Hypertension.  FH:  No family history on file.  [place] Social History  Substance Use Topics  . Smoking status: Current Every Day Smoker    Packs/day: 1.00    Years: 44.00    Types: Cigarettes  . Smokeless tobacco: Never Used  . Alcohol use No     Comment: 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    Review of Systems Review of systems negative except for pertinent positives and negatives in history of present illness above.     Objective:     Vitals:   05/12/17 1403  BP: (!) 144/86  Pulse: 98  Temp: 98.1 F (36.7 C)  TempSrc: Oral  SpO2: 96%  Weight: 246 lb (111.6 kg)  Height: 5\' 7"  (1.702 m)    Physical Exam GEN: obese, NAD. Difficult exam.  RESP: no increased work of breathing CVS: tachy to 94, RR, S1 and S2 normal, no murmur MSK:  Back:  Normal skin, spine with normal alignment and no deformity.    Tenderness to palpation over lower thoracic and lumbar spines or paraspinous muscles.  ROM is difficult to assess due to body habitus. He uses cane for walking  Seated SLR negative bilaterally. Couldn't perform lying SLR  Neuro exam in LE: motor 5/5 in all muscle groups of LEs,  light sensation intact in L4-S1 dermatomes, patellar reflexes 3+ bilaterally    Assessment and Plan:  Chronic back  pain Chronic issue. Bladder accident is concerning but has resolved. No other red flags. Congratulated him on his weight loss and encouraged him to keep it up. Patient to let me know if he has further bladder or bowel issue. They would like to hold on MRI until his hip and knee surgery.  -Refilled his Norco today. Gave three prescriptions for  three months. [place] narcotic data based reviewed and no red flag noted.   Anemia Denies hematochezia and melena. Will check CBC and anemia panel today. Encouraged him to get his colonoscopy.   HYPERTENSION, BENIGN Blood pressure mildly elevated. He was also tachycardic. -BMP, TSH and free T4 today    Orders Placed This Encounter  Procedures  . CBC with Differential/Platelet  . Basic metabolic panel  . TSH  . Anemia Profile A  . Iron and TIBC    Return in about 3 months (around 08/12/2017) for Pain.  Mercy Riding, MD 05/12/17 Pager: [phone]

## 2017-05-12 NOTE — Assessment & Plan Note (Signed)
Chronic issue. Bladder accident is concerning but has resolved. No other red flags. Congratulated him on his weight loss and encouraged him to keep it up. Patient to let me know if he has further bladder or bowel issue. They would like to hold on MRI until his hip and knee surgery.  -Refilled his Norco today. Gave three prescriptions for three months. [place] narcotic data based reviewed and no red flag noted.

## 2017-05-12 NOTE — Assessment & Plan Note (Signed)
Blood pressure mildly elevated. He was also tachycardic. -BMP, TSH and free T4 today

## 2017-05-12 NOTE — Assessment & Plan Note (Addendum)
Denies hematochezia and melena. Will check CBC and anemia panel today. Encouraged him to get his colonoscopy.

## 2017-05-13 ENCOUNTER — Encounter: Payer: Self-pay | Admitting: Student

## 2017-05-13 LAB — BASIC METABOLIC PANEL
BUN/Creatinine Ratio: 13 (ref 9–20)
BUN: 7 mg/dL (ref 6–24)
CALCIUM: 8.8 mg/dL (ref 8.7–10.2)
CHLORIDE: 100 mmol/L (ref 96–106)
CO2: 19 mmol/L (ref 18–29)
Creatinine, Ser: 0.54 mg/dL — ABNORMAL LOW (ref 0.76–1.27)
GFR calc Af Amer: 133 mL/min/{1.73_m2} (ref >59)
GFR calc non Af Amer: 115 mL/min/{1.73_m2} (ref >59)
GLUCOSE: 88 mg/dL (ref 65–99)
Potassium: 3.4 mmol/L — ABNORMAL LOW (ref 3.5–5.2)
Sodium: 137 mmol/L (ref 134–144)

## 2017-05-13 LAB — ANEMIA PROFILE A
BASOS: 0 %
Basophils Absolute: 0 10*3/uL (ref 0.0–0.2)
EOS (ABSOLUTE): 0.2 10*3/uL (ref 0.0–0.4)
Eos: 1 %
HEMATOCRIT: 42.2 % (ref 37.5–51.0)
HEMOGLOBIN: 14.5 g/dL (ref 13.0–17.7)
IRON SATURATION: 19 % (ref 15–55)
Immature Grans (Abs): 0 10*3/uL (ref 0.0–0.1)
Immature Granulocytes: 0 %
Iron: 66 ug/dL (ref 38–169)
LYMPHS: 24 %
Lymphocytes Absolute: 3.2 10*3/uL — ABNORMAL HIGH (ref 0.7–3.1)
MCH: 28.9 pg (ref 26.6–33.0)
MCHC: 34.4 g/dL (ref 31.5–35.7)
MCV: 84 fL (ref 79–97)
MONOS ABS: 0.9 10*3/uL (ref 0.1–0.9)
Monocytes: 7 %
NEUTROS ABS: 8.8 10*3/uL — AB (ref 1.4–7.0)
NEUTROS PCT: 68 %
Platelets: 261 10*3/uL (ref 150–379)
RBC: 5.01 x10E6/uL (ref 4.14–5.80)
RDW: 15 % (ref 12.3–15.4)
Retic Ct Pct: 1.7 % (ref 0.6–2.6)
Total Iron Binding Capacity: 343 ug/dL (ref 250–450)
UIBC: 277 ug/dL (ref 111–343)
WBC: 13.2 10*3/uL — AB (ref 3.4–10.8)

## 2017-05-13 LAB — TSH: TSH: 1.53 u[IU]/mL (ref 0.450–4.500)

## 2017-05-15 ENCOUNTER — Other Ambulatory Visit (INDEPENDENT_AMBULATORY_CARE_PROVIDER_SITE_OTHER): Payer: Self-pay | Admitting: Physician Assistant

## 2017-05-19 ENCOUNTER — Encounter (HOSPITAL_COMMUNITY): Payer: Self-pay

## 2017-05-19 ENCOUNTER — Encounter (HOSPITAL_COMMUNITY)
Admission: RE | Admit: 2017-05-19 | Discharge: 2017-05-19 | Disposition: A | Discharge status: Home / Self Care | Payer: Managed Care, Other (non HMO) | Source: Ambulatory Visit | Attending: Orthopaedic Surgery | Admitting: Orthopaedic Surgery

## 2017-05-19 DIAGNOSIS — I444 Left anterior fascicular block: Secondary | ICD-10-CM | POA: Insufficient documentation

## 2017-05-19 DIAGNOSIS — Z01810 Encounter for preprocedural cardiovascular examination: Secondary | ICD-10-CM | POA: Diagnosis not present

## 2017-05-19 DIAGNOSIS — X58XXXA Exposure to other specified factors, initial encounter: Secondary | ICD-10-CM | POA: Diagnosis not present

## 2017-05-19 DIAGNOSIS — I10 Essential (primary) hypertension: Secondary | ICD-10-CM | POA: Insufficient documentation

## 2017-05-19 DIAGNOSIS — Z01818 Encounter for other preprocedural examination: Secondary | ICD-10-CM | POA: Insufficient documentation

## 2017-05-19 DIAGNOSIS — S83242A Other tear of medial meniscus, current injury, left knee, initial encounter: Secondary | ICD-10-CM | POA: Insufficient documentation

## 2017-05-19 DIAGNOSIS — I4510 Unspecified right bundle-branch block: Secondary | ICD-10-CM | POA: Diagnosis not present

## 2017-05-19 HISTORY — DX: Unspecified macular degeneration: H35.30

## 2017-05-19 HISTORY — DX: Pure hypercholesterolemia, unspecified: E78.00

## 2017-05-19 HISTORY — DX: Pneumonia, unspecified organism: J18.9

## 2017-05-19 HISTORY — DX: Unspecified osteoarthritis, unspecified site: M19.90

## 2017-05-19 LAB — CBC
HCT: 43.2 % (ref 39.0–52.0)
Hemoglobin: 14.1 g/dL (ref 13.0–17.0)
MCH: 28.5 pg (ref 26.0–34.0)
MCHC: 32.6 g/dL (ref 30.0–36.0)
MCV: 87.3 fL (ref 78.0–100.0)
PLATELETS: 252 10*3/uL (ref 150–400)
RBC: 4.95 MIL/uL (ref 4.22–5.81)
RDW: 14.4 % (ref 11.5–15.5)
WBC: 13 10*3/uL — AB (ref 4.0–10.5)

## 2017-05-19 LAB — BASIC METABOLIC PANEL
Anion gap: 10 (ref 5–15)
BUN: 5 mg/dL — ABNORMAL LOW (ref 6–20)
CALCIUM: 8.8 mg/dL — AB (ref 8.9–10.3)
CHLORIDE: 101 mmol/L (ref 101–111)
CO2: 24 mmol/L (ref 22–32)
CREATININE: 0.56 mg/dL — AB (ref 0.61–1.24)
GFR calc Af Amer: >60 mL/min (ref >60)
GFR calc non Af Amer: >60 mL/min (ref >60)
Glucose, Bld: 101 mg/dL — ABNORMAL HIGH (ref 65–99)
Potassium: 3.3 mmol/L — ABNORMAL LOW (ref 3.5–5.1)
SODIUM: 135 mmol/L (ref 135–145)

## 2017-05-19 NOTE — Progress Notes (Signed)
   05/19/17 0920  OBSTRUCTIVE SLEEP APNEA  Have you ever been diagnosed with sleep apnea through a sleep study? No  Do you snore loudly (loud enough to be heard through closed doors)?  1  Do you often feel tired, fatigued, or sleepy during the daytime (such as falling asleep during driving or talking to someone)? 0  Has anyone observed you stop breathing during your sleep? 0  Do you have, or are you being treated for high blood pressure? 1  BMI more than 35 kg/m2? 1  Age > 50 (1-yes) 1  Neck circumference greater than:Male 16 inches or larger, Male 17inches or larger? 1  Male Gender (Yes=1) 1  Obstructive Sleep Apnea Score 6  Score 5 or greater  Results sent to PCP

## 2017-05-19 NOTE — Pre-Procedure Instructions (Signed)
George Leon  05/19/2017      George Leon, [place] - [facility]. [address] [place] [address] Phone: [phone] Fax: [place] [address] Phone: [phone] Fax: [place], [place] [address] Phone: [phone] Fax: [phone]  [place], [place] [address] Phone: [phone] Fax: [phone]  [facility] [place], [place] [address] Phone: [phone] Fax: [phone]  [place], [place]. [place]. [place] [address] Phone: [phone] Fax: [phone]  [facility] [place], [place] [address] [place] [address] Phone: [phone] Fax: [phone]    Your procedure is scheduled on Tuesday, June 19  Report to [facility] Admitting at 1:00 PM                 Your surgery or procedure is scheduled for 3:00 PM   Call this number if you have problems the morning of surgery: [phone]                For any other questions, please call [phone], Monday - Friday 8 AM - 4 PM.    Remember:  Do not eat food or drink liquids after midnight Monday, June 18.  Take these medicines the morning of surgery with A SIP OF WATER : amLODipine (NORVASC), DULoxetine (CYMBALTA).  Use Inhalers if needed and bring it to the hospital with you.             May take if needed: HYDROcodone-acetaminophen ([place]), esomeprazole ([place]).               1 Week prior to surgery STOP taking Aspirin, Aspirin Products (Goody Powder, Excedrin Migraine), Ibuprofen (Advil), Naproxen (Aleve), Vitamins  and Herbal Products (ie Fish Oil) Special instructions:   [facility]- Preparing For Surgery  Before surgery, you can play an important role. Because skin is not sterile, your skin needs to be as free of germs as possible. You can reduce the number of germs on your skin by washing with CHG (chlorahexidine gluconate) Soap before surgery.  CHG is an antiseptic cleaner which kills germs and bonds with the skin to continue killing germs even after washing.  Please do not use if you have an allergy to CHG or antibacterial soaps. If your skin becomes reddened/irritated stop using the CHG.  Do not shave (including legs and underarms) for at least 48 hours prior to first CHG shower. It is OK to shave your face.  Please follow these instructions carefully.   1. Shower the NIGHT BEFORE SURGERY and the MORNING OF SURGERY with CHG.   2. If you chose to wash your hair, wash your hair first as usual with your normal shampoo.  3. After you shampoo, rinse your hair and body thoroughly to remove the shampoo.  4. Use CHG as you would any other liquid soap. You can apply CHG directly to the skin and wash gently with a scrungie or a clean washcloth.   5. Apply the CHG Soap to your body ONLY FROM THE NECK DOWN.  Do not use on open wounds or open sores. Avoid contact  with your eyes, ears, mouth and genitals (private parts). Wash genitals (private parts) with your normal soap.  6. Wash thoroughly, paying special attention to the area where your surgery will be performed.  7. Thoroughly rinse your body with warm water from the neck down.  8. DO NOT shower/wash with your normal soap after using and rinsing off the CHG Soap.  9. Pat yourself dry with a CLEAN TOWEL.   10. Wear CLEAN PAJAMAS   11. Place CLEAN SHEETS on your bed the night of your first shower and DO NOT SLEEP WITH PETS.  Day of Surgery: Do not apply any deodorants/lotions,powders, or perfumes . Please wear clean clothes to the hospital/surgery  center.     Do not wear jewelry, make-up or nail polish.  Do not shave 48 hours prior to surgery.  Men may shave face and neck.  Do not bring valuables to the hospital.  [facility] is not responsible for any belongings or valuables.  Contacts, dentures or bridgework may not be worn into surgery.  Leave your suitcase in the car.  After surgery it may be brought to your room.  For patients admitted to the hospital, discharge time will be determined by your treatment team.  Patients discharged the day of surgery will not be allowed to drive home.   Name and phone number of your driver:   -  Please read over the following fact sheets that you were given: Pain Booklet,Incentive Spirometry, Surgical Site Infections.

## 2017-05-21 NOTE — Progress Notes (Signed)
Anesthesia Chart Review:  Pt is a 61 year old male scheduled for L knee arthroscopy with partial medial meniscectomy 05/27/2017 with Jean Rosenthal, M.D.  - Receives primary care at [facility]. Last office visit 05/12/17 with Wendee Beavers, MD who is aware of upcoming surgery.  PMH includes: MI (I cannot find evidence of this, normal cath in 3235), HTN, diastolic HF, hyperlipidemia, COPD. Current smoker. BMI 39.  Medications include: Albuterol, amlodipine, ASA 81 mg, Lipitor, Nexium, trelegy ellipta, losartan  Preoperative labs reviewed.  CT chest (lung cancer screen) 11/29/16:  1. Lung-RADS Category 1S, negative. Continue annual screening with low-dose chest CT without contrast in 12 months. 2. The "S" modifier above refers to potentially clinically significant non lung cancer related findings. Specifically, there is aortic atherosclerosis, in addition to left main and 2 vessel coronary artery disease. Please note that although the presence of coronary artery calcium documents the presence of coronary artery disease, the severity of this disease and any potential stenosis cannot be assessed on this non-gated CT examination. Assessment for potential risk factor modification, dietary therapy or pharmacologic therapy may be warranted, if clinically indicated. 3. Hepatic steatosis.  EKG 05/19/2017: NSR. RBBB. LAFB. Appears stable when compared to EKG 11/28/15.  Echo 11/26/15: - Left ventricle: The cavity size was normal. There was mild concentric hypertrophy. Systolic function was normal. The estimated ejection fraction was in the range of 55% to 60%. Wall motion was normal; there were no regional wall motion abnormalities. Doppler parameters are consistent with abnormal left ventricular relaxation (grade 1 diastolic dysfunction). Doppler parameters are consistent with elevated ventricular end-diastolic filling pressure. - Aortic valve: Trileaflet; mildly thickened, mildly  calcified leaflets. There was no regurgitation. - Aortic root: The aortic root was normal in size. - Right ventricle: The cavity size was normal. Wall thickness was normal. Systolic function was normal. - Right atrium: The atrium was mildly dilated. - Tricuspid valve: Structurally normal valve. There was moderate regurgitation. - Pulmonic valve: There was no regurgitation. - Pulmonary arteries: Systolic pressure was moderately increased. PA peak pressure: 48 mm Hg (S). - Inferior vena cava: The vessel was dilated. The respirophasic diameter changes were blunted (< 50%), consistent with elevated central venous pressure. - Pericardium, extracardiac: There was no pericardial effusion.  Cardiac cath 05/29/07:  1. Angiographically patent coronary arteries with normal left ventricular systolic function.  2. Multiple causes for shortness of breath including tobacco smoking, obesity, and an elevated blood pressure.  If no changes, I anticipate pt can proceed with surgery as scheduled.   Willeen Cass, FNP-BC [facility]/Anesthesiology Phone: [phone] 05/21/2017 12:43 PM

## 2017-05-26 MED ORDER — CEFAZOLIN SODIUM-DEXTROSE 2-4 GM/100ML-% IV SOLN
2.0000 g | INTRAVENOUS | Status: AC
  Administered 2017-05-27: 2 g via INTRAVENOUS

## 2017-05-27 ENCOUNTER — Ambulatory Visit (HOSPITAL_COMMUNITY)
Admission: RE | Admit: 2017-05-27 | Discharge: 2017-05-27 | Disposition: A | Discharge status: Home / Self Care | Payer: Managed Care, Other (non HMO) | Source: Ambulatory Visit | Attending: Orthopaedic Surgery | Admitting: Orthopaedic Surgery

## 2017-05-27 ENCOUNTER — Ambulatory Visit (HOSPITAL_COMMUNITY): Payer: Managed Care, Other (non HMO) | Admitting: Anesthesiology

## 2017-05-27 ENCOUNTER — Encounter (HOSPITAL_COMMUNITY)
Admission: RE | Disposition: A | Discharge status: Home / Self Care | Payer: Self-pay | Source: Ambulatory Visit | Attending: Orthopaedic Surgery

## 2017-05-27 ENCOUNTER — Ambulatory Visit (HOSPITAL_COMMUNITY): Payer: Managed Care, Other (non HMO) | Admitting: Emergency Medicine

## 2017-05-27 ENCOUNTER — Encounter (HOSPITAL_COMMUNITY): Payer: Self-pay | Admitting: *Deleted

## 2017-05-27 DIAGNOSIS — S83242A Other tear of medial meniscus, current injury, left knee, initial encounter: Secondary | ICD-10-CM | POA: Diagnosis not present

## 2017-05-27 DIAGNOSIS — S83242D Other tear of medial meniscus, current injury, left knee, subsequent encounter: Secondary | ICD-10-CM

## 2017-05-27 DIAGNOSIS — I110 Hypertensive heart disease with heart failure: Secondary | ICD-10-CM | POA: Insufficient documentation

## 2017-05-27 DIAGNOSIS — M2242 Chondromalacia patellae, left knee: Secondary | ICD-10-CM | POA: Insufficient documentation

## 2017-05-27 DIAGNOSIS — Z79899 Other long term (current) drug therapy: Secondary | ICD-10-CM | POA: Insufficient documentation

## 2017-05-27 DIAGNOSIS — I252 Old myocardial infarction: Secondary | ICD-10-CM | POA: Insufficient documentation

## 2017-05-27 DIAGNOSIS — Z7982 Long term (current) use of aspirin: Secondary | ICD-10-CM | POA: Insufficient documentation

## 2017-05-27 DIAGNOSIS — I509 Heart failure, unspecified: Secondary | ICD-10-CM | POA: Diagnosis not present

## 2017-05-27 DIAGNOSIS — Y929 Unspecified place or not applicable: Secondary | ICD-10-CM | POA: Diagnosis not present

## 2017-05-27 DIAGNOSIS — E7800 Pure hypercholesterolemia, unspecified: Secondary | ICD-10-CM | POA: Diagnosis not present

## 2017-05-27 DIAGNOSIS — X58XXXA Exposure to other specified factors, initial encounter: Secondary | ICD-10-CM | POA: Diagnosis not present

## 2017-05-27 DIAGNOSIS — M94262 Chondromalacia, left knee: Secondary | ICD-10-CM | POA: Diagnosis not present

## 2017-05-27 DIAGNOSIS — G894 Chronic pain syndrome: Secondary | ICD-10-CM | POA: Insufficient documentation

## 2017-05-27 DIAGNOSIS — J449 Chronic obstructive pulmonary disease, unspecified: Secondary | ICD-10-CM | POA: Diagnosis not present

## 2017-05-27 DIAGNOSIS — I5033 Acute on chronic diastolic (congestive) heart failure: Secondary | ICD-10-CM | POA: Diagnosis not present

## 2017-05-27 DIAGNOSIS — F17210 Nicotine dependence, cigarettes, uncomplicated: Secondary | ICD-10-CM | POA: Insufficient documentation

## 2017-05-27 HISTORY — PX: KNEE ARTHROSCOPY: SHX127

## 2017-05-27 SURGERY — ARTHROSCOPY, KNEE
Anesthesia: General | Laterality: Left

## 2017-05-27 MED ORDER — CHLORHEXIDINE GLUCONATE 4 % EX LIQD: 60.0000 mL | Freq: Once | CUTANEOUS | Status: DC

## 2017-05-27 MED ORDER — LACTATED RINGERS IV SOLN
INTRAVENOUS | Status: DC
  Administered 2017-05-27 (×2): via INTRAVENOUS

## 2017-05-27 MED ORDER — PROPOFOL 10 MG/ML IV BOLUS
INTRAVENOUS | Status: DC | PRN
  Administered 2017-05-27 (×2): 100 mg via INTRAVENOUS

## 2017-05-27 MED ORDER — FENTANYL CITRATE (PF) 100 MCG/2ML IJ SOLN
INTRAMUSCULAR | Status: DC | PRN
  Administered 2017-05-27 (×5): 50 ug via INTRAVENOUS

## 2017-05-27 MED ORDER — ONDANSETRON HCL 4 MG/2ML IJ SOLN
INTRAMUSCULAR | Status: DC | PRN
Start: 2017-05-27 — End: 2017-05-27
  Administered 2017-05-27: 4 mg via INTRAVENOUS

## 2017-05-27 MED ORDER — OXYCODONE-ACETAMINOPHEN 5-325 MG PO TABS
2.0000 | ORAL_TABLET | Freq: Once | ORAL | Status: AC
  Administered 2017-05-27: 2 via ORAL

## 2017-05-27 MED ORDER — MORPHINE SULFATE (PF) 4 MG/ML IV SOLN
INTRAVENOUS | Status: DC | PRN
  Administered 2017-05-27: 4 mg via INTRAVENOUS

## 2017-05-27 MED ORDER — HYDROMORPHONE HCL 1 MG/ML IJ SOLN
0.2500 mg | INTRAMUSCULAR | Status: DC | PRN
Start: 2017-05-27 — End: 2017-05-27
  Administered 2017-05-27 (×2): 0.5 mg via INTRAVENOUS

## 2017-05-27 MED ORDER — OXYCODONE-ACETAMINOPHEN 5-325 MG PO TABS: 1.0000 | ORAL_TABLET | ORAL | 0 refills | Status: DC | PRN

## 2017-05-27 MED ORDER — SODIUM CHLORIDE 0.9 % IR SOLN
Status: DC | PRN
  Administered 2017-05-27: 3000 mL

## 2017-05-27 MED ORDER — MIDAZOLAM HCL 2 MG/2ML IJ SOLN
INTRAMUSCULAR | Status: AC
  Filled 2017-05-27: qty 2

## 2017-05-27 MED ORDER — ALBUTEROL SULFATE (2.5 MG/3ML) 0.083% IN NEBU
INHALATION_SOLUTION | RESPIRATORY_TRACT | Status: AC
  Administered 2017-05-27: 2.5 mg via RESPIRATORY_TRACT
  Filled 2017-05-27: qty 3

## 2017-05-27 MED ORDER — BUPIVACAINE HCL (PF) 0.25 % IJ SOLN
INTRAMUSCULAR | Status: AC
  Filled 2017-05-27: qty 30

## 2017-05-27 MED ORDER — MORPHINE SULFATE (PF) 4 MG/ML IV SOLN
INTRAVENOUS | Status: AC
  Filled 2017-05-27: qty 1

## 2017-05-27 MED ORDER — HYDROMORPHONE HCL 1 MG/ML IJ SOLN
INTRAMUSCULAR | Status: DC
Start: 2017-05-27 — End: 2017-05-27
  Filled 2017-05-27: qty 1

## 2017-05-27 MED ORDER — MIDAZOLAM HCL 5 MG/5ML IJ SOLN
INTRAMUSCULAR | Status: DC | PRN
  Administered 2017-05-27: 2 mg via INTRAVENOUS

## 2017-05-27 MED ORDER — LIDOCAINE HCL (CARDIAC) 20 MG/ML IV SOLN
INTRAVENOUS | Status: DC | PRN
  Administered 2017-05-27: 60 mg via INTRAVENOUS

## 2017-05-27 MED ORDER — ALBUTEROL SULFATE (2.5 MG/3ML) 0.083% IN NEBU
2.5000 mg | INHALATION_SOLUTION | Freq: Once | RESPIRATORY_TRACT | Status: AC
  Administered 2017-05-27: 2.5 mg via RESPIRATORY_TRACT

## 2017-05-27 MED ORDER — PHENYLEPHRINE 40 MCG/ML (10ML) SYRINGE FOR IV PUSH (FOR BLOOD PRESSURE SUPPORT)
PREFILLED_SYRINGE | INTRAVENOUS | Status: AC
  Filled 2017-05-27: qty 10

## 2017-05-27 MED ORDER — OXYCODONE-ACETAMINOPHEN 5-325 MG PO TABS
ORAL_TABLET | ORAL | Status: DC
Start: 2017-05-27 — End: 2017-05-27
  Filled 2017-05-27: qty 2

## 2017-05-27 MED ORDER — BUPIVACAINE HCL (PF) 0.25 % IJ SOLN
INTRAMUSCULAR | Status: DC | PRN
  Administered 2017-05-27: 20 mL

## 2017-05-27 MED ORDER — FENTANYL CITRATE (PF) 250 MCG/5ML IJ SOLN
INTRAMUSCULAR | Status: AC
  Filled 2017-05-27: qty 5

## 2017-05-27 SURGICAL SUPPLY — 30 items
BANDAGE ACE 6X5 VEL STRL LF (GAUZE/BANDAGES/DRESSINGS) ×3 IMPLANT
BLADE CLIPPER SURG (BLADE) IMPLANT
BLADE CUTTER GATOR 3.5 (BLADE) ×3 IMPLANT
DRAPE ARTHROSCOPY W/POUCH 114 (DRAPES) ×3 IMPLANT
DRAPE U-SHAPE 47X51 STRL (DRAPES) IMPLANT
DRSG PAD ABDOMINAL 8X10 ST (GAUZE/BANDAGES/DRESSINGS) IMPLANT
DURAPREP 26ML APPLICATOR (WOUND CARE) ×6 IMPLANT
GAUZE SPONGE 4X4 12PLY STRL (GAUZE/BANDAGES/DRESSINGS) ×3 IMPLANT
GAUZE XEROFORM 1X8 LF (GAUZE/BANDAGES/DRESSINGS) ×3 IMPLANT
GLOVE BIOGEL PI IND STRL 8 (GLOVE) ×2 IMPLANT
GLOVE BIOGEL PI INDICATOR 8 (GLOVE) ×4
GLOVE ORTHO TXT STRL SZ7.5 (GLOVE) ×3 IMPLANT
GLOVE SURG ORTHO 8.0 STRL STRW (GLOVE) ×3 IMPLANT
GOWN STRL REUS W/ TWL LRG LVL3 (GOWN DISPOSABLE) ×2 IMPLANT
GOWN STRL REUS W/ TWL XL LVL3 (GOWN DISPOSABLE) ×4 IMPLANT
GOWN STRL REUS W/TWL LRG LVL3 (GOWN DISPOSABLE) ×4
GOWN STRL REUS W/TWL XL LVL3 (GOWN DISPOSABLE) ×8
KIT ROOM TURNOVER OR (KITS) ×3 IMPLANT
MANIFOLD NEPTUNE II (INSTRUMENTS) ×3 IMPLANT
PACK ARTHROSCOPY DSU (CUSTOM PROCEDURE TRAY) ×3 IMPLANT
PAD ARMBOARD 7.5X6 YLW CONV (MISCELLANEOUS) ×6 IMPLANT
PADDING CAST COTTON 6X4 STRL (CAST SUPPLIES) ×3 IMPLANT
SET ARTHROSCOPY TUBING (MISCELLANEOUS) ×2
SET ARTHROSCOPY TUBING LN (MISCELLANEOUS) ×1 IMPLANT
SPONGE LAP 4X18 X RAY DECT (DISPOSABLE) ×3 IMPLANT
SUT ETHILON 3 0 PS 1 (SUTURE) ×3 IMPLANT
TOWEL OR 17X24 6PK STRL BLUE (TOWEL DISPOSABLE) IMPLANT
TOWEL OR 17X26 10 PK STRL BLUE (TOWEL DISPOSABLE) ×3 IMPLANT
WAND HAND CNTRL MULTIVAC 90 (MISCELLANEOUS) IMPLANT
WATER STERILE IRR 1000ML POUR (IV SOLUTION) ×3 IMPLANT

## 2017-05-27 NOTE — Discharge Instructions (Signed)
You may put all of your weight on your left knee as comfort allows. Increase your activities as comfort allows. Ice and elevation as needed for swelling. You can remove your dressings in 24 hours and start getting your incisions wet daily in the shower. Expect some drainage. Place band-aids over your incisions daily.

## 2017-05-27 NOTE — Transfer of Care (Signed)
Immediate Anesthesia Transfer of Care Note  Patient: George Leon  Procedure(s) Performed: Procedure(s): LEFT KNEE ARTHROSCOPY WITH PARTIAL MEDIAL MENISCECTOMY (Left)  Patient Location: PACU  Anesthesia Type:General  Level of Consciousness: awake, alert  and oriented  Airway & Oxygen Therapy: Patient Spontanous Breathing and Patient connected to nasal cannula oxygen  Post-op Assessment: Report given to RN, Post -op Vital signs reviewed and stable and Patient moving all extremities  Post vital signs: Reviewed and stable  Last Vitals:  Vitals:   05/27/17 1213 05/27/17 1625  BP: 131/86 (!) 143/76  Pulse:  (!) 107  Resp:  (!) 21  Temp:  36.4 C    Last Pain:  Vitals:   05/27/17 1625  TempSrc:   PainSc: 4       Patients Stated Pain Goal: 5 (53/79/43 2761)  Complications: No apparent anesthesia complications

## 2017-05-27 NOTE — Anesthesia Preprocedure Evaluation (Addendum)
Anesthesia Evaluation  Patient identified by MRN, date of birth, ID band Patient awake    Reviewed: Allergy & Precautions, H&P , NPO status , Patient's Chart, lab work & pertinent test results  Airway Mallampati: II  TM Distance: >3 FB Neck ROM: Full    Dental no notable dental hx. (+) Edentulous Upper, Edentulous Lower, Dental Advisory Given   Pulmonary COPD,  COPD inhaler, Current Smoker,    Pulmonary exam normal breath sounds clear to auscultation       Cardiovascular hypertension, Pt. on medications + Peripheral Vascular Disease and +CHF   Rhythm:Regular Rate:Normal     Neuro/Psych negative neurological ROS  negative psych ROS   GI/Hepatic Neg liver ROS, GERD  Medicated and Controlled,  Endo/Other  Morbid obesity  Renal/GU negative Renal ROS  negative genitourinary   Musculoskeletal  (+) Arthritis , Osteoarthritis,    Abdominal   Peds  Hematology negative hematology ROS (+) anemia ,   Anesthesia Other Findings   Reproductive/Obstetrics negative OB ROS                            Anesthesia Physical Anesthesia Plan  ASA: III  Anesthesia Plan: General   Post-op Pain Management:    Induction: Intravenous  PONV Risk Score and Plan: 2 and Ondansetron, Dexamethasone, Propofol and Midazolam  Airway Management Planned: LMA  Additional Equipment:   Intra-op Plan:   Post-operative Plan: Extubation in OR  Informed Consent: I have reviewed the patients History and Physical, chart, labs and discussed the procedure including the risks, benefits and alternatives for the proposed anesthesia with the patient or authorized representative who has indicated his/her understanding and acceptance.   Dental advisory given  Plan Discussed with: CRNA  Anesthesia Plan Comments:         Anesthesia Quick Evaluation

## 2017-05-27 NOTE — Anesthesia Procedure Notes (Signed)
Procedure Name: LMA Insertion Date/Time: 05/27/2017 3:30 PM Performed by: Ollen Bowl Pre-anesthesia Checklist: Patient identified, Emergency Drugs available, Suction available, Patient being monitored and Timeout performed Patient Re-evaluated:Patient Re-evaluated prior to inductionOxygen Delivery Method: Circle system utilized and Simple face mask Preoxygenation: Pre-oxygenation with 100% oxygen Intubation Type: IV induction Ventilation: Mask ventilation without difficulty LMA Size: 5.0 Number of attempts: 1 Airway Equipment and Method: Patient positioned with wedge pillow Placement Confirmation: positive ETCO2 and breath sounds checked- equal and bilateral Tube secured with: Tape Dental Injury: Teeth and Oropharynx as per pre-operative assessment

## 2017-05-27 NOTE — H&P (Signed)
George Leon is an 61 y.o. male.   Chief Complaint:   Left knee pain HPI:   61 yo male with severe and chronic left knee pain with locking and catching.  A recent MRI shows a complex left knee medial meniscal tear.  At this point, with the failure of conservative treatment, we have recommended a left knee arthroscopy.   Past Medical History:  Diagnosis Date  . Acute MI ([place])   . Acute on chronic diastolic heart failure ([place]) 12/01/2015  . Arthritis   . Chronic pain   . Chronic pain syndrome 12/01/2015  . COPD (chronic obstructive pulmonary disease) ([place])   . High cholesterol   . Hypertension   . Macular degeneration, right eye   . Pneumonia    2016    Past Surgical History:  Procedure Laterality Date  . BACK SURGERY    . CERVICAL SPINE SURGERY  1988    No family history on file. Social History:  reports that he has been smoking Cigarettes.  He has a 44.00 pack-year smoking history. He has never used smokeless tobacco. He reports that he does not drink alcohol or use drugs.  Allergies:  Allergies  Allergen Reactions  . Adhesive [Tape] Rash    Pulled skin off also  . Hydrochlorothiazide Other (See Comments)    Back Pain & gives him kidney stones  . Sulfamethoxazole Swelling    Tongue Swelling    Medications Prior to Admission  Medication Sig Dispense Refill  . albuterol (PROAIR HFA) 108 (90 Base) MCG/ACT inhaler Inhale 1-2 puffs into the lungs every 4 (four) hours as needed. As needed for shortness of breath /wheeze/cough 3 each 2  . amLODipine (NORVASC) 10 MG tablet Take 0.5 tablets (5 mg total) by mouth daily. 90 tablet 1  . aspirin EC 81 MG tablet Take 1 tablet (81 mg total) by mouth daily. 30 tablet 11  . atorvastatin (LIPITOR) 80 MG tablet Take 1 tablet (80 mg total) by mouth daily. (Patient taking differently: Take 80 mg by mouth daily at 6 PM. ) 90 tablet 3  . DULoxetine (CYMBALTA) 60 MG capsule Take 1 capsule (60 mg total) by mouth daily. 90 capsule 3  .  esomeprazole (NEXIUM) 40 MG capsule TAKE ONE CAPSULE BY MOUTH ONCE DAILY AS NEEDED FOR  INDIGESTION (Patient taking differently: Take 40 mg by mouth daily as needed (indigestion). ) 30 capsule 0  . fish oil-omega-3 fatty acids 1000 MG capsule Take 1 g by mouth daily.     . Fluticasone-Umeclidin-Vilant (TRELEGY ELLIPTA) 100-62.5-25 MCG/INH AEPB Inhale 1 puff into the lungs daily. (Patient taking differently: Inhale 1 puff into the lungs daily as needed (shortness of breath). ) 60 each 2  . HYDROcodone-acetaminophen (NORCO) 5-325 MG tablet Take 2 tablets by mouth every 8 (eight) hours as needed for severe pain. 180 tablet 0  . ibuprofen (ADVIL,MOTRIN) 200 MG tablet Take 400 mg by mouth every 6 (six) hours as needed for mild pain.    Marland Kitchen losartan (COZAAR) 100 MG tablet Take 1 tablet (100 mg total) by mouth daily. 90 tablet 0  . Multiple Vitamins-Minerals (OCUVITE ADULT FORMULA) CAPS Take 1 capsule by mouth daily. 90 capsule 3  . nystatin ointment (MYCOSTATIN) Apply 1 application topically 2 (two) times daily. (Patient taking differently: Apply 1 application topically 2 (two) times daily as needed (rash). ) 30 g 0  . Elastic Bandages & Supports (MEDICAL COMPRESSION STOCKINGS) MISC 1 each by Does not apply route once. 2 each 0  No results found for this or any previous visit (from the past 48 hour(s)). No results found.  Review of Systems  Musculoskeletal: Positive for back pain, joint pain and myalgias.  All other systems reviewed and are negative.   There were no vitals taken for this visit. Physical Exam  Constitutional: He is oriented to person, place, and time. He appears well-developed.  HENT:  Head: Normocephalic and atraumatic.  Eyes: EOM are normal. Pupils are equal, round, and reactive to light.  Neck: Normal range of motion. Neck supple.  Cardiovascular: Normal rate.   Respiratory: Effort normal.  GI: Soft. Bowel sounds are normal.  Musculoskeletal:       Left knee: He exhibits  decreased range of motion and effusion. Tenderness found. Medial joint line tenderness noted.  Neurological: He is alert and oriented to person, place, and time.  Skin: Skin is warm and dry.  Psychiatric: He has a normal mood and affect.     Assessment/Plan Left knee with chronic pain and a complex medial meniscal tear  1)  To the OR today as an outpatient for a left knee arthrospy and a partial medial meniscectomy.  The risks and benefits have been explained in detail and informed consent is obtained.  Mcarthur Rossetti, MD 05/27/2017, 12:01 PM

## 2017-05-27 NOTE — Anesthesia Postprocedure Evaluation (Signed)
Anesthesia Post Note  Patient: George Leon  Procedure(s) Performed: Procedure(s) (LRB): LEFT KNEE ARTHROSCOPY WITH PARTIAL MEDIAL MENISCECTOMY (Left)     Patient location during evaluation: PACU Anesthesia Type: General Level of consciousness: awake and alert Pain management: pain level controlled Vital Signs Assessment: post-procedure vital signs reviewed and stable Respiratory status: spontaneous breathing, nonlabored ventilation and respiratory function stable Cardiovascular status: blood pressure returned to baseline and stable Postop Assessment: no signs of nausea or vomiting Anesthetic complications: no    Last Vitals:  Vitals:   05/27/17 1655 05/27/17 1710  BP: (!) 144/82 136/81  Pulse: 91 94  Resp: 16 17  Temp:      Last Pain:  Vitals:   05/27/17 1625  TempSrc:   PainSc: 4                  Laurey Salser,W. EDMOND

## 2017-05-27 NOTE — Brief Op Note (Signed)
05/27/2017  4:19 PM  PATIENT:  George Leon  61 y.o. male  PRE-OPERATIVE DIAGNOSIS:  left knee medial meniscal tear  POST-OPERATIVE DIAGNOSIS:  LEFT KNEE MEDIAL MENISCAL TEAR   PROCEDURE:  Procedure(s): LEFT KNEE ARTHROSCOPY WITH PARTIAL MEDIAL MENISCECTOMY (Left)  SURGEON:  Surgeon(s) and Role:    Mcarthur Rossetti, MD - Primary  ANESTHESIA:   local and general  EBL:  Total I/O In: -  Out: 25 [Blood:25]  COUNTS:  YES  TOURNIQUET:  * No tourniquets in log *  DICTATION: .Other Dictation: Dictation Number [id number]  PLAN OF CARE: Discharge to home after PACU  PATIENT DISPOSITION:  PACU - hemodynamically stable.   Delay start of Pharmacological VTE agent (>24hrs) due to surgical blood loss or risk of bleeding: no

## 2017-05-28 ENCOUNTER — Encounter (HOSPITAL_COMMUNITY): Payer: Self-pay | Admitting: Orthopaedic Surgery

## 2017-05-28 NOTE — Op Note (Signed)
NAME:  George Leon, George Leon                   ACCOUNT NO.:  MEDICAL RECORD NO.:  [id number]  LOCATION:                                 FACILITY:  PHYSICIAN:  Lind Guest. 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  PROCEDURE:  Left knee arthroscopy with partial medial meniscectomy.  FINDINGS:  Minimal small posterior horn medial meniscal tear and chondromalacia of the trochlear groove.  SURGEON:  Lind Guest. Ninfa Linden, M.D.  ANESTHESIA: 1. General. 2. Local with mixture of 0.25% plain Marcaine.  BLOOD LOSS:  Minimal.  COMPLICATIONS:  None.  INDICATIONS:  Mr. Maimone is a 61 year old gentleman with debilitating left knee pain.  He says the hydrocodone does not even touch his pain. Eventually an MRI was obtained and showed minimal cartilage changes in the knee but also meniscal tear.  Due to his continued symptoms and the failure of conservative treatment including rest, ice, heat, multiple injections, and activity modification; he did wish for an arthroscopic intervention.  Our hope is to get him off chronic narcotic pain medications.  The risks and benefits of surgery have been explained to him in detail, and he does wish to proceed.  PROCEDURE DESCRIPTION:  After informed consent was obtained, appropriate left knee was marked.  He was brought to the operating room, placed supine on the operating table.  General anesthesia was then obtained. His left leg was prepped and draped from the thigh down the ankle with DuraPrep and sterile drapes including sterile stockinette.  With the bed raised and a lateral leg post utilized, the left knee was flexed off the side of the table.  Time-out was called, and he was  identified as correct patient and correct left knee.  I then made an anterolateral arthroscopy portal, inserted a cannula in the knee and did not find any significant effusion.  I went to the medial compartment of the knee and made an anterior medial incision.  We found the cartilage on the medial and lateral femoral condyles to be intact as well as the medial and lateral tibial plateaus.  He had small medial meniscal tear which I debrided back to a stable margin using straight and up cutting biters. The lateral meniscus was assessed with the knee in a figure 4 position and found to be intact, and the ACL and PCL were intact.  Finally, the knee in extended position, he did have some grade 4 changes of the trochlea groove but these were minimal.  I debrided this back to a stable margin.  I then allowed fluid to lavage the knee, and then drained all fluid from the knee.  The portal sites were closed with interrupted nylon suture and inserted a mixture of morphine and Marcaine into the knee joint and arthroscopy portals. Xeroform, well-padded sterile dressing was applied.  He was awakened, extubated, and taken to the recovery room in stable condition.  All final counts were correct.  There were no complications noted.  Lind Guest. Ninfa Linden, M.D.     CYB/MEDQ  D:  05/27/2017  T:  05/27/2017  Job:  [id number]

## 2017-06-04 ENCOUNTER — Other Ambulatory Visit (INDEPENDENT_AMBULATORY_CARE_PROVIDER_SITE_OTHER): Payer: Self-pay

## 2017-06-04 ENCOUNTER — Ambulatory Visit (INDEPENDENT_AMBULATORY_CARE_PROVIDER_SITE_OTHER): Payer: Medicare Other | Admitting: Orthopaedic Surgery

## 2017-06-04 DIAGNOSIS — M25552 Pain in left hip: Secondary | ICD-10-CM

## 2017-06-04 DIAGNOSIS — S83242D Other tear of medial meniscus, current injury, left knee, subsequent encounter: Secondary | ICD-10-CM

## 2017-06-04 DIAGNOSIS — M1612 Unilateral primary osteoarthritis, left hip: Secondary | ICD-10-CM

## 2017-06-04 NOTE — Progress Notes (Signed)
The patient is following up postop status post a left knee arthroscopy. We found very small meniscal tear in his knee but actually intact cartilage throughout the knee. He says the knee is doing great.  On examination of left knee we removed the sutures. He has no significant effusion with good range of motion the knee. He does have significant pain with range of motion of his left hip. We have seen x-rays of his lumbar spine to see his left hip that shows complete loss of joint space and superior lateral aspect.  At this point I will like to send her to Dr. Ernestina Patches for intra-articular steroid injection of his left hip. I'll see him back myself in 4 weeks to see how is doing overall and I would like a low AP pelvis at that visit.

## 2017-06-16 ENCOUNTER — Ambulatory Visit (INDEPENDENT_AMBULATORY_CARE_PROVIDER_SITE_OTHER): Payer: Managed Care, Other (non HMO) | Admitting: Physical Medicine and Rehabilitation

## 2017-06-16 ENCOUNTER — Ambulatory Visit (INDEPENDENT_AMBULATORY_CARE_PROVIDER_SITE_OTHER): Payer: Managed Care, Other (non HMO)

## 2017-06-16 ENCOUNTER — Encounter (INDEPENDENT_AMBULATORY_CARE_PROVIDER_SITE_OTHER): Payer: Self-pay | Admitting: Physical Medicine and Rehabilitation

## 2017-06-16 DIAGNOSIS — M25552 Pain in left hip: Secondary | ICD-10-CM | POA: Diagnosis not present

## 2017-06-16 NOTE — Patient Instructions (Signed)

## 2017-06-16 NOTE — Progress Notes (Deleted)
Left hip pain. No groin pain. Pain radiates down the side of leg to knee. Constant pain with walking and standing. Ok with sitting.

## 2017-06-16 NOTE — Progress Notes (Signed)
George Leon - 61 y.o. male MRN [id number]  Date of birth: September 24, 1956  Office Visit Note: Visit Date: 06/16/2017 PCP: Mercy Riding, MD Referred by: Mercy Riding, MD  Subjective: Chief Complaint  Patient presents with  . Left Hip - Pain   HPI: [name] is a 61 year old gentleman followed closely by Dr. Ninfa Linden who recently underwent knee arthroscopy. He was found to be having a lot of pain on the left side particularly with hip motion and walking and movement. Hip x-rays have shown complete loss of joint space on that side. He denies any specific groin pain. His pain is more anterior and posterior laterally radiating to the knee somewhat laterally. Dr. Ninfa Linden requests diagnostic and hopefully therapeutic anesthetic hip arthrogram.    ROS Otherwise per HPI.  Assessment & Plan: Visit Diagnoses:  1. Pain in left hip     Plan: Findings:  Diagnostic and hopefully therapeutic left anesthetic arthrogram. Patient didn't seem to have much relief during the anesthetic phase. His symptom complaints were very mechanical with weightbearing.    Meds & Orders: No orders of the defined types were placed in this encounter.   Orders Placed This Encounter  Procedures  . Large Joint Injection/Arthrocentesis  . XR C-ARM NO REPORT    Follow-up: Return if symptoms worsen or fail to improve, for Dr. Ninfa Linden.   Procedures: Hip anesthetic arthrogram Date/Time: 06/16/2017 1:27 PM Performed by: Magnus Sinning Authorized by: Magnus Sinning   Consent Given by:  Patient Site marked: the procedure site was marked   Timeout: prior to procedure the correct patient, procedure, and site was verified   Indications:  Pain and diagnostic evaluation Location:  Hip Site:  L hip joint Prep: patient was prepped and draped in usual sterile fashion   Needle Size:  22 G Approach:  Anterior Ultrasound Guidance: No   Fluoroscopic Guidance: No   Arthrogram: Yes   Medications:  3 mL bupivacaine 0.5  %; 80 mg triamcinolone acetonide 40 MG/ML Aspiration Attempted: Yes   Patient tolerance:  Patient tolerated the procedure well with no immediate complications  Arthrogram demonstrated excellent flow of contrast throughout the joint surface without extravasation or obvious defect.  The patient did not have much relief of symptoms during the anesthetic phase of the injection.      No notes on file   Clinical History: No specialty comments available.  He reports that he has been smoking Cigarettes.  He has a 44.00 pack-year smoking history. He has never used smokeless tobacco. No results for input(s): HGBA1C, LABURIC in the last 8760 hours.  Objective:  VS:  HT:    WT:   BMI:     BP:   HR: bpm  TEMP: ( )  RESP:  Physical Exam  Musculoskeletal:  Patient ambulates with an antalgic gait to the left using a cane. He has good distal strength.    Ortho Exam Imaging: Xr C-arm No Report  Result Date: 06/16/2017 Please see Notes or Procedures tab for imaging impression.   Past Medical/Family/Surgical/Social History: Medications & Allergies reviewed per EMR Patient Active Problem List   Diagnosis Date Noted  . Anemia 05/12/2017  . Other tear of medial meniscus, current injury, left knee, subsequent encounter 04/21/2017  . Chronic pain of left knee 04/21/2017  . Chronic back pain 02/12/2017  . Pulmonary hypertension ([place]) 09/13/2016  . Hyperlipidemia 09/13/2016  . Hyponatremia 04/15/2016  . Muscle cramps 04/15/2016  . Left hip pain 03/26/2016  . Cervical neck pain  with evidence of disc disease 01/18/2016  . Physical debility 12/01/2015  . Chronic pain syndrome 12/01/2015  . Tobacco abuse   . Coronary artery disease involving native coronary artery of native heart without angina pectoris   . Olecranon bursitis of right elbow 09/21/2015  . Chronic venous insufficiency 06/19/2015  . Diastolic CHF ([place]) 37/34/2876  . Proteinuria 05/22/2015  . Macular degeneration, bilateral  05/22/2015  . Bilateral lower extremity edema 05/11/2015  . Osteoarthritis of left knee 12/28/2014  . Osteoarthritis of right knee 12/28/2014  . Peripheral vascular disease ([place]) 08/23/2013  . Disorder of SI (sacroiliac) joint 03/30/2013  . GANGLION CYST, WRIST, LEFT 11/16/2010  . Chronic obstructive pulmonary disease ([place]) 10/26/2010  . Osteoarthritis of multiple joints 08/25/2008  . OBESITY, MORBID 05/26/2008  . GERD 06/19/2007  . TOBACCO ABUSE 04/07/2007  . Preventative health care 04/07/2007  . HYPERCHOLESTEROLEMIA 02/16/2007  . HYPERTENSION, BENIGN 02/16/2007   Past Medical History:  Diagnosis Date  . Acute MI ([place])   . Acute on chronic diastolic heart failure ([place]) 12/01/2015  . Arthritis   . Chronic pain   . Chronic pain syndrome 12/01/2015  . COPD (chronic obstructive pulmonary disease) ([place])   . High cholesterol   . Hypertension   . Macular degeneration, right eye   . Pneumonia    2016   No family history on file. Past Surgical History:  Procedure Laterality Date  . BACK SURGERY    . [place]  . KNEE ARTHROSCOPY Left 05/27/2017   Procedure: LEFT KNEE ARTHROSCOPY WITH PARTIAL MEDIAL MENISCECTOMY;  Surgeon: Mcarthur Rossetti, MD;  Location: [facility];  Service: Orthopedics;  Laterality: Left;   Social History   Occupational History  . Not on file.   Social History Main Topics  . Smoking status: Current Every Day Smoker    Packs/day: 1.00    Years: 44.00    Types: Cigarettes  . Smokeless tobacco: Never Used  . 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 Drug use: No  . Sexual activity: No

## 2017-06-17 MED ORDER — TRIAMCINOLONE ACETONIDE 40 MG/ML IJ SUSP
80.0000 mg | INTRAMUSCULAR | Status: AC | PRN
  Administered 2017-06-16: 80 mg via INTRA_ARTICULAR

## 2017-06-17 MED ORDER — BUPIVACAINE HCL 0.5 % IJ SOLN
3.0000 mL | INTRAMUSCULAR | Status: AC | PRN
  Administered 2017-06-16: 3 mL via INTRA_ARTICULAR

## 2017-06-19 ENCOUNTER — Telehealth: Payer: Self-pay | Admitting: Student

## 2017-06-19 NOTE — Telephone Encounter (Signed)
Called and talked to patient in regards to his request for refill on his Norco. Patient was given 3 prescriptions for Norco at his last visit with me. Then he presented to orthopedics and had knee surgery. He was given a prescription for Percocets # 60 on 05/27/2017. Patient would've been due for his Norco refill on 7/11 hadn't he had Percocet on 05/27/2017. Now he had Percocet filled which should last him at least 5 days, he has to wait until 06/24/2017 to have his Norco refilled. I discussed this with the patient over the phone.  I also reiterated about his pain contract. He should tell other doctors about his pain contract and declines prescriptions for opiates and benzos. Patient voices understanding and agrees.

## 2017-07-02 ENCOUNTER — Ambulatory Visit (INDEPENDENT_AMBULATORY_CARE_PROVIDER_SITE_OTHER): Payer: Managed Care, Other (non HMO) | Admitting: Orthopaedic Surgery

## 2017-07-02 ENCOUNTER — Ambulatory Visit (INDEPENDENT_AMBULATORY_CARE_PROVIDER_SITE_OTHER): Payer: Managed Care, Other (non HMO)

## 2017-07-02 DIAGNOSIS — M25552 Pain in left hip: Secondary | ICD-10-CM

## 2017-07-02 DIAGNOSIS — M1612 Unilateral primary osteoarthritis, left hip: Secondary | ICD-10-CM

## 2017-07-02 NOTE — Progress Notes (Signed)
Office Visit Note   Patient: George Leon           Date of Birth: [birth year]           MRN: [id number] Visit Date: 07/02/2017              Requested by: Mercy Riding, MD [address] [place], [place] [address] PCP: Mercy Riding, MD   Assessment & Plan: Visit Diagnoses:  1. Pain in left hip   2. Unilateral primary osteoarthritis, left hip     Plan: Given the severity of the disease process of his left hip I am recommending a total hip arthroplasty. We had a long and thorough discussion about the surgery as well as a discussion of the risk and benefits of it. I described the intraoperative and postoperative courses well. I gave him our surgery scheduler's card is: Talked to his wife about this. I gave him a handout on anterior hip replacement surgery as well.  Follow-Up Instructions: Return if symptoms worsen or fail to improve, for 2 weeks post-op.   Orders:  Orders Placed This Encounter  Procedures  . XR Pelvis 1-2 Views   No orders of the defined types were placed in this encounter.     Procedures: No procedures performed   Clinical Data: No additional findings.   Subjective: No chief complaint on file.   HPI The patient comes in today with chief complaint of left hip pain. We have performed arthroscopic intervention on his left knee recently but the left hip is well bothers him quite a bit. He has pain with activities daily living. It is in his groin. It is daily and is him impacting negatively his quality of life and his mobility. He in place with a cane as well. Review of Systems He currently denies any headache, chest pain, short of breath, fever, chills, nausea, vomiting  Objective: Vital Signs: There were no vitals taken for this visit.  Physical Exam He is alert and oriented 3 and in no acute distress. He does ambulate with a cane and he has a significant limp and a large to his gait with it being a Trendelenburg type of gait. Ortho Exam I can  easily rotated his right hip with no issues. His left hip has significant limitation is internal rotation rotation and severe pain with rotation. Specialty Comments:  No specialty comments available.  Imaging: Xr Pelvis 1-2 Views  Result Date: 07/02/2017 An AP pelvis and a lateral of his left hip show severe end-stage arthritis of left hip. There is complete loss of the joint space as well as cystic and sclerotic changes. There is significant particular osteophytes as well.    PMFS History: Patient Active Problem List   Diagnosis Date Noted  . Unilateral primary osteoarthritis, left hip 07/02/2017  . Pain in left hip 07/02/2017  . Anemia 05/12/2017  . Other tear of medial meniscus, current injury, left knee, subsequent encounter 04/21/2017  . Chronic pain of left knee 04/21/2017  . Chronic back pain 02/12/2017  . Pulmonary hypertension ([place]) 09/13/2016  . Hyperlipidemia 09/13/2016  . Hyponatremia 04/15/2016  . Muscle cramps 04/15/2016  . Left hip pain 03/26/2016  . Cervical neck pain with evidence of disc disease 01/18/2016  . Physical debility 12/01/2015  . Chronic pain syndrome 12/01/2015  . Tobacco abuse   . Coronary artery disease involving native coronary artery of native heart without angina pectoris   . Olecranon bursitis of right elbow 09/21/2015  .  Chronic venous insufficiency 06/19/2015  . Diastolic CHF ([place]) 16/09/9603  . Proteinuria 05/22/2015  . Macular degeneration, 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  . Bilateral lower extremity edema 05/11/2015  . Osteoarthritis of left knee 12/28/2014  . Osteoarthritis of right knee 12/28/2014  . Peripheral vascular disease ([place]) 08/23/2013  . Disorder of SI (sacroiliac) joint 03/30/2013  . GANGLION CYST, WRIST, LEFT 11/16/2010  . Chronic obstructive pulmonary disease ([place]) 10/26/2010  . Osteoarthritis of multiple joints 08/25/2008  . OBESITY, MORBID 05/26/2008  . GERD 06/19/2007  . TOBACCO ABUSE 04/07/2007  . Preventative health care  04/07/2007  . HYPERCHOLESTEROLEMIA 02/16/2007  . HYPERTENSION, BENIGN 02/16/2007   Past Medical History:  Diagnosis Date  . Acute MI ([place])   . Acute on chronic diastolic heart failure ([place]) 12/01/2015  . Arthritis   . Chronic pain   . Chronic pain syndrome 12/01/2015  . COPD (chronic obstructive pulmonary disease) ([place])   . High cholesterol   . Hypertension   . Macular degeneration, right eye   . Pneumonia    2016    No family history on file.  Past Surgical History:  Procedure Laterality Date  . BACK SURGERY    . [facility]  . KNEE ARTHROSCOPY Left 05/27/2017   Procedure: LEFT KNEE ARTHROSCOPY WITH PARTIAL MEDIAL MENISCECTOMY;  Surgeon: Mcarthur Rossetti, MD;  Location: [facility];  Service: Orthopedics;  Laterality: Left;   Social History   Occupational History  . Not on file.   Social History Main Topics  . Smoking status: Current Every Day Smoker    Packs/day: 1.00    Years: 44.00    Types: Cigarettes  . Smokeless tobacco: Never Used  . 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 Drug use: No  . Sexual activity: No

## 2017-07-24 ENCOUNTER — Other Ambulatory Visit (INDEPENDENT_AMBULATORY_CARE_PROVIDER_SITE_OTHER): Payer: Self-pay | Admitting: Physician Assistant

## 2017-07-25 NOTE — Pre-Procedure Instructions (Signed)
George Leon  07/25/2017      [place], [place] - [address] [facility]. [address] [place] [address] Phone: [phone] Fax: [place] [address] Phone: [phone] Fax: [place], [place] [address] Phone: [phone] Fax: [phone]  [place], [place] [address] Phone: [phone] Fax: [phone]  [facility], [place] [address] Phone: [phone] Fax: [phone]  [place], [place] [address] Phone: [phone] Fax: [phone]  [facility] [place], [place] [address] [place] [address] Phone: [phone] Fax: [phone]    Your procedure is scheduled on August 28. 2108.  Report to [facility] Admitting at 1010 AM.  Call this number if you have problems the morning of surgery:  [phone]   Remember:  Do not eat food or drink liquids after midnight.  Take these medicines the morning of surgery with A SIP OF WATER albuterol inhaler (bring with you), fluticasone-umeclidinVilant (Trelegy Ellipta) inhaler (bring with you), amlodipine (norvasc), duloxetine (cymbalta), esomeprazole (nexium), hydrocodone-acetaminophen (norco)-if needed for pain, oxycodone-acetaminophen (roxicet) -if needed for pain, nasal spray if needed  7 days prior to surgery STOP taking any Aspirin, Aleve, Naproxen, Ibuprofen, Motrin, Advil, Goody's, BC's, all herbal medications, fish oil, and all vitamins   Do not wear jewelry, make-up or nail polish.  Do not wear lotions,  powders, or perfumes, or deoderant.   Men may shave face and neck.  Do not bring valuables to the hospital.  [facility] is not responsible for any belongings or valuables.  Contacts, dentures or bridgework may not be worn into surgery.  Leave your suitcase in the car.  After surgery it may be brought to your room.  For patients admitted to the hospital, discharge time will be determined by your treatment team.  Patients discharged the day of surgery will not be allowed to drive home.   Special instructions:   [facility]- Preparing For Surgery  Before surgery, you can play an important role. Because skin is not sterile, your skin needs to be as free of germs as possible. You can reduce the number of germs on your skin by washing with CHG (chlorahexidine gluconate) Soap before surgery.  CHG is an antiseptic cleaner which kills germs and bonds with the skin to continue killing germs even after washing.  Please do not use if you have an allergy to CHG or antibacterial soaps. If your skin becomes reddened/irritated stop using the CHG.  Do not shave (including legs and underarms) for at least 48 hours prior to first CHG shower. It is OK to shave your face.  Please follow these instructions carefully.   1. Shower the NIGHT BEFORE SURGERY and the MORNING OF SURGERY with CHG.   2. If you chose to wash your hair, wash your hair first as usual with your normal shampoo.  3. After you shampoo, rinse your hair and body thoroughly to remove the shampoo.  4. Use CHG as you would any other liquid soap. You can apply CHG directly to the skin and wash gently with a scrungie or a  clean washcloth.   5. Apply the CHG Soap to your body ONLY FROM THE NECK DOWN.  Do not use on open wounds or open sores. Avoid contact with your eyes, ears, mouth and genitals (private parts). Wash genitals (private parts) with your normal soap.  6. Wash thoroughly, paying special attention to the area where your surgery will  be performed.  7. Thoroughly rinse your body with warm water from the neck down.  8. DO NOT shower/wash with your normal soap after using and rinsing off the CHG Soap.  9. Pat yourself dry with a CLEAN TOWEL.   10. Wear CLEAN PAJAMAS   11. Place CLEAN SHEETS on your bed the night of your first shower and DO NOT SLEEP WITH PETS.    Day of Surgery: Do not apply any deodorants/lotions. Please wear clean clothes to the hospital/surgery center.     Please read over the following fact sheets that you were given. Pain Booklet, Coughing and Deep Breathing, MRSA Information and Surgical Site Infection Prevention

## 2017-07-28 ENCOUNTER — Encounter (HOSPITAL_COMMUNITY): Payer: Self-pay

## 2017-07-28 ENCOUNTER — Encounter (HOSPITAL_COMMUNITY)
Admission: RE | Admit: 2017-07-28 | Discharge: 2017-07-28 | Disposition: A | Discharge status: Home / Self Care | Payer: Managed Care, Other (non HMO) | Source: Ambulatory Visit | Attending: Orthopaedic Surgery | Admitting: Orthopaedic Surgery

## 2017-07-28 DIAGNOSIS — M1612 Unilateral primary osteoarthritis, left hip: Secondary | ICD-10-CM | POA: Insufficient documentation

## 2017-07-28 DIAGNOSIS — Z01812 Encounter for preprocedural laboratory examination: Secondary | ICD-10-CM | POA: Insufficient documentation

## 2017-07-28 HISTORY — DX: Fatty (change of) liver, not elsewhere classified: K76.0

## 2017-07-28 HISTORY — DX: Dyspnea, unspecified: R06.00

## 2017-07-28 HISTORY — DX: Personal history of other diseases of the digestive system: Z87.19

## 2017-07-28 HISTORY — DX: Heart failure, unspecified: I50.9

## 2017-07-28 LAB — CBC
HCT: 41.7 % (ref 39.0–52.0)
HEMOGLOBIN: 13.3 g/dL (ref 13.0–17.0)
MCH: 28.5 pg (ref 26.0–34.0)
MCHC: 31.9 g/dL (ref 30.0–36.0)
MCV: 89.3 fL (ref 78.0–100.0)
Platelets: 268 10*3/uL (ref 150–400)
RBC: 4.67 MIL/uL (ref 4.22–5.81)
RDW: 15.1 % (ref 11.5–15.5)
WBC: 13.7 10*3/uL — ABNORMAL HIGH (ref 4.0–10.5)

## 2017-07-28 LAB — COMPREHENSIVE METABOLIC PANEL
ALT: 18 U/L (ref 17–63)
AST: 21 U/L (ref 15–41)
Albumin: 3.4 g/dL — ABNORMAL LOW (ref 3.5–5.0)
Alkaline Phosphatase: 69 U/L (ref 38–126)
Anion gap: 9 (ref 5–15)
BUN: <5 mg/dL — ABNORMAL LOW (ref 6–20)
CHLORIDE: 104 mmol/L (ref 101–111)
CO2: 24 mmol/L (ref 22–32)
CREATININE: 0.57 mg/dL — AB (ref 0.61–1.24)
Calcium: 8.8 mg/dL — ABNORMAL LOW (ref 8.9–10.3)
Glucose, Bld: 152 mg/dL — ABNORMAL HIGH (ref 65–99)
POTASSIUM: 3.3 mmol/L — AB (ref 3.5–5.1)
Sodium: 137 mmol/L (ref 135–145)
Total Bilirubin: 0.7 mg/dL (ref 0.3–1.2)
Total Protein: 6.6 g/dL (ref 6.5–8.1)

## 2017-07-28 LAB — PROTIME-INR
INR: 0.92
PROTHROMBIN TIME: 12.4 s (ref 11.4–15.2)

## 2017-07-28 LAB — SURGICAL PCR SCREEN
MRSA, PCR: NEGATIVE
Staphylococcus aureus: NEGATIVE

## 2017-07-28 NOTE — Progress Notes (Signed)
   07/28/17 0918  OBSTRUCTIVE SLEEP APNEA  Have you ever been diagnosed with sleep apnea through a sleep study? No  Do you snore loudly (loud enough to be heard through closed doors)?  0  Do you often feel tired, fatigued, or sleepy during the daytime (such as falling asleep during driving or talking to someone)? 0  Has anyone observed you stop breathing during your sleep? 0  Do you have, or are you being treated for high blood pressure? 1  BMI more than 35 kg/m2? 1  Age > 50 (1-yes) 1  Neck circumference greater than:Male 16 inches or larger, Male 17inches or larger? 1  Male Gender (Yes=1) 1  Obstructive Sleep Apnea Score 5  Score 5 or greater  Results sent to PCP

## 2017-07-28 NOTE — Progress Notes (Signed)
Patient states he does not have a cardiologist.  PCP is Dr. Cyndia Skeeters at [facility].

## 2017-07-30 ENCOUNTER — Other Ambulatory Visit (INDEPENDENT_AMBULATORY_CARE_PROVIDER_SITE_OTHER): Payer: Self-pay | Admitting: Orthopaedic Surgery

## 2017-07-30 NOTE — Progress Notes (Signed)
Pt is a 61 year old male scheduled for L total hip arthroplasty anterior approach on 08/05/2017 with Jean Rosenthal, M.D.  - Receives primary care at [facility]. Last office visit 05/12/17 with Wendee Beavers, MD.  PMH includes: MI (I cannot find evidence of this, normal cath in 5374), HTN, diastolic HF, hyperlipidemia, COPD, fatty liver. Current smoker. BMI 39.5. S/p L knee arthroscopy 05/27/17.   Medications include: Albuterol, amlodipine, ASA 81 mg, Lipitor, Nexium, trelegy ellipta, losartan  Preoperative labs reviewed. Glucose 152 without hx of DM.  I routed results to PCP for f/u.   CT chest (lung cancer screen) 11/29/16:  1. Lung-RADS Category 1S, negative. Continue annual screening with low-dose chest CT without contrast in 12 months. 2. The "S" modifier above refers to potentially clinically significant non lung cancer related findings. Specifically, there is aortic atherosclerosis, in addition to left main and 2 vessel coronary artery disease. Please note that although the presence of coronary artery calcium documents the presence of coronary artery disease, the severity of this disease and any potential stenosis cannot be assessed on this non-gated CT examination. Assessment for potential risk factor modification, dietary therapy or pharmacologic therapy may be warranted, if clinically indicated. 3. Hepatic steatosis.  EKG 05/19/2017: NSR. RBBB. LAFB. Appears stable when compared to EKG 11/28/15.  Echo 11/26/15: - Left ventricle: The cavity size was normal. There was mildconcentric hypertrophy. Systolic function was normal. Theestimated ejection fraction was in the range of 55% to 60%. Wallmotion was normal; there were no regional wall motionabnormalities. Doppler parameters are consistent with abnormalleft ventricular relaxation (grade 1 diastolic dysfunction).Doppler parameters are consistent with elevated ventricularend-diastolic filling pressure. - Aortic  valve: Trileaflet; mildly thickened, mildly calcified leaflets. There was no regurgitation. - Aortic root: The aortic root was normal in size. - Right ventricle: The cavity size was normal. Wall thickness wasnormal. Systolic function was normal. - Right atrium: The atrium was mildly dilated. - Tricuspid valve: Structurally normal valve. There was moderateregurgitation. - Pulmonic valve: There was no regurgitation. - Pulmonary arteries: Systolic pressure was moderately increased.PA peak pressure: 48 mm Hg (S). - Inferior vena cava: The vessel was dilated. The respirophasicdiameter changes were blunted (<50%), consistent with elevatedcentral venous pressure. - Pericardium, extracardiac: There was no pericardial effusion.  Cardiac cath 05/29/07:  1. Angiographically patent coronary arteries with normal leftventricular systolic function. 2. Multiple causes for shortness of breath including tobacco smoking, obesity, and an elevated blood pressure.  If no changes, I anticipate pt can proceed with surgery as scheduled.   Willeen Cass, FNP-BC [facility] Short Stay Surgical Center/Anesthesiology Phone: [phone] 07/30/2017 11:02 AM

## 2017-08-04 ENCOUNTER — Encounter: Payer: Self-pay | Admitting: Student

## 2017-08-04 ENCOUNTER — Ambulatory Visit (INDEPENDENT_AMBULATORY_CARE_PROVIDER_SITE_OTHER): Payer: Managed Care, Other (non HMO) | Admitting: Student

## 2017-08-04 VITALS — BP 122/72 | HR 94 | Temp 98.4°F | Ht 68.0 in | Wt 257.0 lb

## 2017-08-04 DIAGNOSIS — R739 Hyperglycemia, unspecified: Secondary | ICD-10-CM

## 2017-08-04 DIAGNOSIS — Z23 Encounter for immunization: Secondary | ICD-10-CM | POA: Diagnosis not present

## 2017-08-04 DIAGNOSIS — G8929 Other chronic pain: Secondary | ICD-10-CM

## 2017-08-04 LAB — POCT GLYCOSYLATED HEMOGLOBIN (HGB A1C): Hemoglobin A1C: 6.1

## 2017-08-04 MED ORDER — DEXTROSE 5 % IV SOLN
3.0000 g | INTRAVENOUS | Status: AC
  Administered 2017-08-05: 2 g via INTRAVENOUS
  Filled 2017-08-04: qty 3

## 2017-08-04 MED ORDER — TRANEXAMIC ACID 1000 MG/10ML IV SOLN
1000.0000 mg | INTRAVENOUS | Status: AC
  Administered 2017-08-05: 1000 mg via INTRAVENOUS
  Filled 2017-08-04: qty 1100

## 2017-08-04 NOTE — Patient Instructions (Signed)
It was great seeing you today! We have addressed the following issues today 1. Pain: Please come back and see [place] after use surgery. I strongly recommend declining pain medication prescriptions from other providers. 2.   Elevated glucose: We will check her A1c and let you know 3.   Sleep apnea: We can discuss about this after you hip surgery  I hope the surgery goes well!!!  If we did any lab work today, and the results require attention, either me or my nurse will get in touch with you. If everything is normal, you will get a letter in mail and a message via . If you don't hear from [place] in two weeks, please give [place] a call. Otherwise, we look forward to seeing you again at your next visit. If you have any questions or concerns before then, please call the clinic at [phone].  Please bring all your medications to every doctors visit  Sign up for My Chart to have easy access to your labs results, and communication with your Primary care physician.    Please check-out at the front desk before leaving the clinic.    Take Care,   Dr. Cyndia Skeeters

## 2017-08-04 NOTE — Progress Notes (Signed)
Subjective:    George Leon is a 61 y.o. old male here for pain medication refills  HPI Pain medication refills: patient has chronic osteoarthritis of multiple joints. He had a knee replacement recently. He has an upcoming hip replacement tomorrow. He comes in today to have a refill on his pain medications (Norco). However, on reviewing the [facility], patient had his Norco refilled just states 2 weeks ago. He also had additional prescription for Percocet from the orthopedic surgeon. Given that he is going to OR tomorrow, I suggested follow-up in clinic or calling the office after his hip surgery to discuss about refill on his pain medications to avoid duplications and confusions. Patient voiced understanding this and agrees to the plan.  Elevated blood glucose: Patient had a blood work about a week ago that showed a fasting blood glucose of 152. Patient has no history of diabetes or diabetes.  PMH/Problem List: has HYPERCHOLESTEROLEMIA; OBESITY, MORBID; TOBACCO ABUSE; Preventative health care; HYPERTENSION, BENIGN; Chronic obstructive pulmonary disease ([place]); GERD; Osteoarthritis of multiple joints; GANGLION CYST, WRIST, LEFT; Disorder of SI (sacroiliac) joint; Peripheral vascular disease ([place]); Osteoarthritis of left knee; Osteoarthritis of right knee; Bilateral lower extremity edema; Diastolic CHF ([place]); Proteinuria; Macular degeneration, bilateral; Chronic venous insufficiency; Olecranon bursitis of right elbow; Tobacco abuse; Coronary artery disease involving native coronary artery of native heart without angina pectoris; Physical debility; Chronic pain syndrome; Cervical neck pain with evidence of disc disease; Left hip pain; Hyponatremia; Muscle cramps; Pulmonary hypertension ([place]); Hyperlipidemia; Chronic back pain; Other tear of medial meniscus, current injury, left knee, subsequent encounter; Chronic pain of left knee; Anemia; Unilateral primary osteoarthritis, left hip; and Pain in left  hip on his problem list.   has a past medical history of Acute MI ([place]); Acute on chronic diastolic heart failure ([place]) (12/01/2015); Arthritis; CHF (congestive heart failure) ([place]); Chronic pain; Chronic pain syndrome (12/01/2015); COPD (chronic obstructive pulmonary disease) ([place]); Dyspnea; High cholesterol; History of hiatal hernia; Hypertension; Liver fatty degeneration; Macular degeneration, right eye; and Pneumonia.  FH:  No family history on file.  [place] Social History  Substance Use Topics  . Smoking status: Current Every Day Smoker    Packs/day: 1.00    Years: 44.00    Types: Cigarettes  . Smokeless tobacco: Never Used  . Alcohol use No     Comment: 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    Review of Systems Review of systems negative except for pertinent positives and negatives in history of present illness above.     Objective:     Vitals:   08/04/17 1402  BP: 122/72  Pulse: 94  Temp: 98.4 F (36.9 C)  TempSrc: Oral  SpO2: 95%  Weight: 257 lb (116.6 kg)  Height: 5\' 8"  (1.727 m)    Physical Exam GEN: appears well, no apparent distress. ENDO: negative thyromegally CVS: RRR, nl S1&S2, no murmurs, no edema RESP: no IWOB, good air movement bilaterally, CTAB GI: BS present & normal, soft, NTND MSK: no focal tenderness or notable swelling SKIN: no apparent skin lesion NEURO: alert and oiented appropriately, no gross deficits  PSYCH: euthymic mood with congruent affect     Assessment and Plan:  1. Encounter for chronic pain management: stable. No red flags. No recent fall. [place] data base reviewed and no red flag noted. Patient just filled his last prescription about two weeks ago. He has hip replacement surgery in 5 days. So, advised him to let me know when he is discharged about  hip surgery for refill on his pain medicines.   2. Hyperglycemia: patient had pre-op blood work about a week ago and his fasting blood glucose was 152. No history of diabetes. Not on  steroid.  - HgB A1c  3. Need for immunization against influenza - Flu Vaccine QUAD 36+ mos IM  Return in about 2 months (around 10/04/2017) for Sleep apnea.  Mercy Riding, MD 08/04/17 Pager: [phone]

## 2017-08-05 ENCOUNTER — Encounter (HOSPITAL_COMMUNITY): Payer: Self-pay | Admitting: *Deleted

## 2017-08-05 ENCOUNTER — Inpatient Hospital Stay (HOSPITAL_COMMUNITY)
Admission: RE | Admit: 2017-08-05 | Discharge: 2017-08-08 | DRG: 470 | Disposition: A | Discharge status: Home Health | Payer: Managed Care, Other (non HMO) | Source: Ambulatory Visit | Attending: Orthopaedic Surgery | Admitting: Orthopaedic Surgery

## 2017-08-05 ENCOUNTER — Inpatient Hospital Stay (HOSPITAL_COMMUNITY): Payer: Managed Care, Other (non HMO)

## 2017-08-05 ENCOUNTER — Inpatient Hospital Stay (HOSPITAL_COMMUNITY): Payer: Managed Care, Other (non HMO) | Admitting: Certified Registered"

## 2017-08-05 ENCOUNTER — Inpatient Hospital Stay (HOSPITAL_COMMUNITY): Payer: Managed Care, Other (non HMO) | Admitting: Emergency Medicine

## 2017-08-05 ENCOUNTER — Encounter (HOSPITAL_COMMUNITY)
Admission: RE | Disposition: A | Discharge status: Home Health | Payer: Self-pay | Source: Ambulatory Visit | Attending: Orthopaedic Surgery

## 2017-08-05 DIAGNOSIS — K219 Gastro-esophageal reflux disease without esophagitis: Secondary | ICD-10-CM | POA: Diagnosis present

## 2017-08-05 DIAGNOSIS — M1612 Unilateral primary osteoarthritis, left hip: Secondary | ICD-10-CM | POA: Diagnosis not present

## 2017-08-05 DIAGNOSIS — J449 Chronic obstructive pulmonary disease, unspecified: Secondary | ICD-10-CM | POA: Diagnosis present

## 2017-08-05 DIAGNOSIS — I739 Peripheral vascular disease, unspecified: Secondary | ICD-10-CM | POA: Diagnosis present

## 2017-08-05 DIAGNOSIS — E669 Obesity, unspecified: Secondary | ICD-10-CM | POA: Diagnosis present

## 2017-08-05 DIAGNOSIS — Z882 Allergy status to sulfonamides status: Secondary | ICD-10-CM | POA: Diagnosis not present

## 2017-08-05 DIAGNOSIS — I252 Old myocardial infarction: Secondary | ICD-10-CM | POA: Diagnosis not present

## 2017-08-05 DIAGNOSIS — I110 Hypertensive heart disease with heart failure: Secondary | ICD-10-CM | POA: Diagnosis not present

## 2017-08-05 DIAGNOSIS — Z79899 Other long term (current) drug therapy: Secondary | ICD-10-CM | POA: Diagnosis not present

## 2017-08-05 DIAGNOSIS — Z7982 Long term (current) use of aspirin: Secondary | ICD-10-CM

## 2017-08-05 DIAGNOSIS — Z6839 Body mass index (BMI) 39.0-39.9, adult: Secondary | ICD-10-CM | POA: Diagnosis not present

## 2017-08-05 DIAGNOSIS — I509 Heart failure, unspecified: Secondary | ICD-10-CM | POA: Diagnosis present

## 2017-08-05 DIAGNOSIS — Z888 Allergy status to other drugs, medicaments and biological substances status: Secondary | ICD-10-CM

## 2017-08-05 DIAGNOSIS — E785 Hyperlipidemia, unspecified: Secondary | ICD-10-CM | POA: Diagnosis present

## 2017-08-05 DIAGNOSIS — M797 Fibromyalgia: Secondary | ICD-10-CM | POA: Diagnosis present

## 2017-08-05 DIAGNOSIS — H3530 Unspecified macular degeneration: Secondary | ICD-10-CM | POA: Diagnosis present

## 2017-08-05 DIAGNOSIS — I2510 Atherosclerotic heart disease of native coronary artery without angina pectoris: Secondary | ICD-10-CM | POA: Diagnosis not present

## 2017-08-05 DIAGNOSIS — Z96642 Presence of left artificial hip joint: Secondary | ICD-10-CM

## 2017-08-05 DIAGNOSIS — F17210 Nicotine dependence, cigarettes, uncomplicated: Secondary | ICD-10-CM | POA: Diagnosis present

## 2017-08-05 DIAGNOSIS — Z419 Encounter for procedure for purposes other than remedying health state, unspecified: Secondary | ICD-10-CM

## 2017-08-05 HISTORY — DX: Other chronic pain: G89.29

## 2017-08-05 HISTORY — PX: TOTAL HIP ARTHROPLASTY: SHX124

## 2017-08-05 HISTORY — DX: Emphysema, unspecified: J43.9

## 2017-08-05 HISTORY — DX: Unspecified chronic bronchitis: J42

## 2017-08-05 HISTORY — DX: Dorsalgia, unspecified: M54.9

## 2017-08-05 HISTORY — DX: Anxiety disorder, unspecified: F41.9

## 2017-08-05 LAB — GLUCOSE, CAPILLARY: GLUCOSE-CAPILLARY: 121 mg/dL — AB (ref 65–99)

## 2017-08-05 SURGERY — ARTHROPLASTY, HIP, TOTAL, ANTERIOR APPROACH
Anesthesia: Spinal | Site: Hip | Laterality: Left

## 2017-08-05 MED ORDER — OXYCODONE HCL 5 MG PO TABS
5.0000 mg | ORAL_TABLET | ORAL | Status: DC | PRN
  Administered 2017-08-05: 10 mg via ORAL
  Administered 2017-08-05: 15 mg via ORAL
  Administered 2017-08-06: 10 mg via ORAL
  Administered 2017-08-06 (×2): 15 mg via ORAL
  Administered 2017-08-06 – 2017-08-07 (×3): 10 mg via ORAL
  Filled 2017-08-05: qty 3
  Filled 2017-08-05 (×4): qty 2
  Filled 2017-08-05 (×2): qty 3

## 2017-08-05 MED ORDER — BUPIVACAINE IN DEXTROSE 0.75-8.25 % IT SOLN
INTRATHECAL | Status: DC | PRN
  Administered 2017-08-05: 15 mL via INTRATHECAL

## 2017-08-05 MED ORDER — SALINE SPRAY 0.65 % NA SOLN
1.0000 | NASAL | Status: DC | PRN
  Filled 2017-08-05: qty 44

## 2017-08-05 MED ORDER — ATORVASTATIN CALCIUM 80 MG PO TABS
80.0000 mg | ORAL_TABLET | Freq: Every day | ORAL | Status: DC
  Administered 2017-08-05 – 2017-08-07 (×3): 80 mg via ORAL
  Filled 2017-08-05 (×3): qty 1

## 2017-08-05 MED ORDER — CEFAZOLIN SODIUM-DEXTROSE 1-4 GM/50ML-% IV SOLN
1.0000 g | Freq: Four times a day (QID) | INTRAVENOUS | Status: AC
  Administered 2017-08-05 – 2017-08-06 (×2): 1 g via INTRAVENOUS
  Filled 2017-08-05 (×2): qty 50

## 2017-08-05 MED ORDER — SODIUM CHLORIDE 0.9 % IR SOLN
Status: DC | PRN
  Administered 2017-08-05: 3000 mL

## 2017-08-05 MED ORDER — PROPOFOL 10 MG/ML IV BOLUS
INTRAVENOUS | Status: DC | PRN
  Administered 2017-08-05: 30 mg via INTRAVENOUS

## 2017-08-05 MED ORDER — PROMETHAZINE HCL 25 MG/ML IJ SOLN: 6.2500 mg | INTRAMUSCULAR | Status: DC | PRN

## 2017-08-05 MED ORDER — ONDANSETRON HCL 4 MG/2ML IJ SOLN
INTRAMUSCULAR | Status: DC | PRN
  Administered 2017-08-05: 4 mg via INTRAVENOUS

## 2017-08-05 MED ORDER — ONDANSETRON HCL 4 MG/2ML IJ SOLN: 4.0000 mg | Freq: Four times a day (QID) | INTRAMUSCULAR | Status: DC | PRN

## 2017-08-05 MED ORDER — MIDAZOLAM HCL 2 MG/2ML IJ SOLN
INTRAMUSCULAR | Status: AC
  Filled 2017-08-05: qty 2

## 2017-08-05 MED ORDER — METOCLOPRAMIDE HCL 5 MG/ML IJ SOLN: 5.0000 mg | Freq: Three times a day (TID) | INTRAMUSCULAR | Status: DC | PRN

## 2017-08-05 MED ORDER — DIPHENHYDRAMINE HCL 12.5 MG/5ML PO ELIX: 12.5000 mg | ORAL_SOLUTION | ORAL | Status: DC | PRN

## 2017-08-05 MED ORDER — FENTANYL CITRATE (PF) 250 MCG/5ML IJ SOLN
INTRAMUSCULAR | Status: DC | PRN
  Administered 2017-08-05: 50 ug via INTRAVENOUS

## 2017-08-05 MED ORDER — METHOCARBAMOL 500 MG PO TABS
500.0000 mg | ORAL_TABLET | Freq: Four times a day (QID) | ORAL | Status: DC | PRN
  Administered 2017-08-05 – 2017-08-08 (×9): 500 mg via ORAL
  Filled 2017-08-05 (×9): qty 1

## 2017-08-05 MED ORDER — PROPOFOL 500 MG/50ML IV EMUL
INTRAVENOUS | Status: DC | PRN
  Administered 2017-08-05: 75 ug/kg/min via INTRAVENOUS

## 2017-08-05 MED ORDER — ONDANSETRON HCL 4 MG/2ML IJ SOLN
INTRAMUSCULAR | Status: AC
  Filled 2017-08-05: qty 2

## 2017-08-05 MED ORDER — 0.9 % SODIUM CHLORIDE (POUR BTL) OPTIME
TOPICAL | Status: DC | PRN
  Administered 2017-08-05: 1000 mL

## 2017-08-05 MED ORDER — MIDAZOLAM HCL 5 MG/5ML IJ SOLN
INTRAMUSCULAR | Status: DC | PRN
  Administered 2017-08-05 (×2): 2 mg via INTRAVENOUS

## 2017-08-05 MED ORDER — DOCUSATE SODIUM 100 MG PO CAPS
100.0000 mg | ORAL_CAPSULE | Freq: Two times a day (BID) | ORAL | Status: DC
Start: 2017-08-05 — End: 2017-08-08
  Administered 2017-08-05 – 2017-08-08 (×5): 100 mg via ORAL
  Filled 2017-08-05 (×6): qty 1

## 2017-08-05 MED ORDER — LOSARTAN POTASSIUM 50 MG PO TABS
100.0000 mg | ORAL_TABLET | Freq: Every day | ORAL | Status: DC
  Administered 2017-08-05 – 2017-08-08 (×4): 100 mg via ORAL
  Filled 2017-08-05 (×4): qty 2

## 2017-08-05 MED ORDER — HYDROMORPHONE HCL 1 MG/ML IJ SOLN
1.0000 mg | INTRAMUSCULAR | Status: DC | PRN
  Administered 2017-08-05: 2 mg via INTRAVENOUS
  Filled 2017-08-05: qty 2

## 2017-08-05 MED ORDER — MENTHOL 3 MG MT LOZG: 1.0000 | LOZENGE | OROMUCOSAL | Status: DC | PRN

## 2017-08-05 MED ORDER — ZOLPIDEM TARTRATE 5 MG PO TABS: 5.0000 mg | ORAL_TABLET | Freq: Every evening | ORAL | Status: DC | PRN

## 2017-08-05 MED ORDER — ALBUTEROL SULFATE (2.5 MG/3ML) 0.083% IN NEBU
3.0000 mL | INHALATION_SOLUTION | RESPIRATORY_TRACT | Status: DC | PRN
  Administered 2017-08-05: 3 mL via RESPIRATORY_TRACT
  Filled 2017-08-05: qty 3

## 2017-08-05 MED ORDER — DULOXETINE HCL 60 MG PO CPEP
60.0000 mg | ORAL_CAPSULE | Freq: Every day | ORAL | Status: DC
  Administered 2017-08-05 – 2017-08-08 (×4): 60 mg via ORAL
  Filled 2017-08-05 (×4): qty 1

## 2017-08-05 MED ORDER — METOCLOPRAMIDE HCL 5 MG PO TABS: 5.0000 mg | ORAL_TABLET | Freq: Three times a day (TID) | ORAL | Status: DC | PRN

## 2017-08-05 MED ORDER — PROPOFOL 10 MG/ML IV BOLUS
INTRAVENOUS | Status: AC
  Filled 2017-08-05: qty 20

## 2017-08-05 MED ORDER — PHENYLEPHRINE 40 MCG/ML (10ML) SYRINGE FOR IV PUSH (FOR BLOOD PRESSURE SUPPORT)
PREFILLED_SYRINGE | INTRAVENOUS | Status: DC | PRN
  Administered 2017-08-05: 80 ug via INTRAVENOUS

## 2017-08-05 MED ORDER — FENTANYL CITRATE (PF) 100 MCG/2ML IJ SOLN: 25.0000 ug | INTRAMUSCULAR | Status: DC | PRN

## 2017-08-05 MED ORDER — SODIUM CHLORIDE 0.9 % IV SOLN
INTRAVENOUS | Status: DC
  Administered 2017-08-05: 18:00:00 via INTRAVENOUS

## 2017-08-05 MED ORDER — PHENYLEPHRINE HCL 10 MG/ML IJ SOLN
INTRAVENOUS | Status: DC | PRN
  Administered 2017-08-05: 25 ug/min via INTRAVENOUS

## 2017-08-05 MED ORDER — BUPIVACAINE HCL (PF) 0.75 % IJ SOLN: INTRAMUSCULAR | Status: DC | PRN

## 2017-08-05 MED ORDER — ALUM & MAG HYDROXIDE-SIMETH 200-200-20 MG/5ML PO SUSP: 30.0000 mL | ORAL | Status: DC | PRN

## 2017-08-05 MED ORDER — AMLODIPINE BESYLATE 5 MG PO TABS
5.0000 mg | ORAL_TABLET | Freq: Every day | ORAL | Status: DC
  Administered 2017-08-05 – 2017-08-08 (×4): 5 mg via ORAL
  Filled 2017-08-05 (×4): qty 1

## 2017-08-05 MED ORDER — METHOCARBAMOL 1000 MG/10ML IJ SOLN
500.0000 mg | Freq: Four times a day (QID) | INTRAMUSCULAR | Status: DC | PRN
  Filled 2017-08-05: qty 5

## 2017-08-05 MED ORDER — ACETAMINOPHEN 325 MG PO TABS
650.0000 mg | ORAL_TABLET | Freq: Four times a day (QID) | ORAL | Status: DC | PRN
  Administered 2017-08-06: 650 mg via ORAL
  Filled 2017-08-05: qty 2

## 2017-08-05 MED ORDER — OXYCODONE HCL 5 MG PO TABS
ORAL_TABLET | ORAL | Status: AC
  Filled 2017-08-05: qty 2

## 2017-08-05 MED ORDER — ACETAMINOPHEN 650 MG RE SUPP: 650.0000 mg | Freq: Four times a day (QID) | RECTAL | Status: DC | PRN

## 2017-08-05 MED ORDER — PHENOL 1.4 % MT LIQD: 1.0000 | OROMUCOSAL | Status: DC | PRN

## 2017-08-05 MED ORDER — PANTOPRAZOLE SODIUM 40 MG PO TBEC
40.0000 mg | DELAYED_RELEASE_TABLET | Freq: Every day | ORAL | Status: DC
  Administered 2017-08-05 – 2017-08-08 (×4): 40 mg via ORAL
  Filled 2017-08-05 (×4): qty 1

## 2017-08-05 MED ORDER — LACTATED RINGERS IV SOLN
INTRAVENOUS | Status: DC
  Administered 2017-08-05: 11:00:00 via INTRAVENOUS

## 2017-08-05 MED ORDER — POLYETHYLENE GLYCOL 3350 17 G PO PACK: 17.0000 g | PACK | Freq: Every day | ORAL | Status: DC | PRN

## 2017-08-05 MED ORDER — FENTANYL CITRATE (PF) 250 MCG/5ML IJ SOLN
INTRAMUSCULAR | Status: AC
  Filled 2017-08-05: qty 5

## 2017-08-05 MED ORDER — CHLORHEXIDINE GLUCONATE 4 % EX LIQD: 60.0000 mL | Freq: Once | CUTANEOUS | Status: DC

## 2017-08-05 MED ORDER — ASPIRIN 81 MG PO CHEW
81.0000 mg | CHEWABLE_TABLET | Freq: Two times a day (BID) | ORAL | Status: DC
  Administered 2017-08-05 – 2017-08-08 (×6): 81 mg via ORAL
  Filled 2017-08-05 (×6): qty 1

## 2017-08-05 MED ORDER — ALBUTEROL SULFATE (2.5 MG/3ML) 0.083% IN NEBU
INHALATION_SOLUTION | RESPIRATORY_TRACT | Status: AC
  Filled 2017-08-05: qty 3

## 2017-08-05 MED ORDER — ONDANSETRON HCL 4 MG PO TABS: 4.0000 mg | ORAL_TABLET | Freq: Four times a day (QID) | ORAL | Status: DC | PRN

## 2017-08-05 SURGICAL SUPPLY — 53 items
BENZOIN TINCTURE PRP APPL 2/3 (GAUZE/BANDAGES/DRESSINGS) ×3 IMPLANT
BLADE CLIPPER SURG (BLADE) IMPLANT
BLADE SAW SGTL 18X1.27X75 (BLADE) ×2 IMPLANT
BLADE SAW SGTL 18X1.27X75MM (BLADE) ×1
CAPT KNEE PARTIAL 1 ×3 IMPLANT
CELLS DAT CNTRL 66122 CELL SVR (MISCELLANEOUS) ×1 IMPLANT
CLOSURE STERI-STRIP 1/2X4 (GAUZE/BANDAGES/DRESSINGS) ×1
CLOSURE WOUND 1/2 X4 (GAUZE/BANDAGES/DRESSINGS) ×2
CLSR STERI-STRIP ANTIMIC 1/2X4 (GAUZE/BANDAGES/DRESSINGS) ×2 IMPLANT
COVER SURGICAL LIGHT HANDLE (MISCELLANEOUS) ×3 IMPLANT
DRAPE C-ARM 42X72 X-RAY (DRAPES) ×3 IMPLANT
DRAPE STERI IOBAN 125X83 (DRAPES) ×3 IMPLANT
DRAPE U-SHAPE 47X51 STRL (DRAPES) ×9 IMPLANT
DRSG AQUACEL AG ADV 3.5X10 (GAUZE/BANDAGES/DRESSINGS) ×3 IMPLANT
DURAPREP 26ML APPLICATOR (WOUND CARE) ×3 IMPLANT
ELECT BLADE 4.0 EZ CLEAN MEGAD (MISCELLANEOUS) ×3
ELECT BLADE 6.5 EXT (BLADE) ×3 IMPLANT
ELECT REM PT RETURN 9FT ADLT (ELECTROSURGICAL) ×3
ELECTRODE BLDE 4.0 EZ CLN MEGD (MISCELLANEOUS) ×1 IMPLANT
ELECTRODE REM PT RTRN 9FT ADLT (ELECTROSURGICAL) ×1 IMPLANT
FACESHIELD WRAPAROUND (MASK) ×6 IMPLANT
GLOVE BIOGEL PI IND STRL 8 (GLOVE) ×2 IMPLANT
GLOVE BIOGEL PI INDICATOR 8 (GLOVE) ×4
GLOVE ECLIPSE 8.0 STRL XLNG CF (GLOVE) ×3 IMPLANT
GLOVE ORTHO TXT STRL SZ7.5 (GLOVE) ×6 IMPLANT
GOWN STRL REUS W/ TWL LRG LVL3 (GOWN DISPOSABLE) ×2 IMPLANT
GOWN STRL REUS W/ TWL XL LVL3 (GOWN DISPOSABLE) ×2 IMPLANT
GOWN STRL REUS W/TWL LRG LVL3 (GOWN DISPOSABLE) ×4
GOWN STRL REUS W/TWL XL LVL3 (GOWN DISPOSABLE) ×4
HANDPIECE INTERPULSE COAX TIP (DISPOSABLE) ×2
KIT BASIN OR (CUSTOM PROCEDURE TRAY) ×3 IMPLANT
KIT ROOM TURNOVER OR (KITS) ×3 IMPLANT
MANIFOLD NEPTUNE II (INSTRUMENTS) ×3 IMPLANT
NS IRRIG 1000ML POUR BTL (IV SOLUTION) ×3 IMPLANT
PACK TOTAL JOINT (CUSTOM PROCEDURE TRAY) ×3 IMPLANT
PAD ARMBOARD 7.5X6 YLW CONV (MISCELLANEOUS) ×3 IMPLANT
RTRCTR WOUND ALEXIS 18CM MED (MISCELLANEOUS) ×3
SET HNDPC FAN SPRY TIP SCT (DISPOSABLE) ×1 IMPLANT
STAPLER VISISTAT 35W (STAPLE) ×3 IMPLANT
STRIP CLOSURE SKIN 1/2X4 (GAUZE/BANDAGES/DRESSINGS) ×4 IMPLANT
SUT ETHIBOND NAB CT1 #1 30IN (SUTURE) ×3 IMPLANT
SUT MNCRL AB 4-0 PS2 18 (SUTURE) ×3 IMPLANT
SUT VIC AB 0 CT1 27 (SUTURE) ×2
SUT VIC AB 0 CT1 27XBRD ANBCTR (SUTURE) ×1 IMPLANT
SUT VIC AB 1 CT1 27 (SUTURE) ×2
SUT VIC AB 1 CT1 27XBRD ANBCTR (SUTURE) ×1 IMPLANT
SUT VIC AB 2-0 CT1 27 (SUTURE) ×2
SUT VIC AB 2-0 CT1 TAPERPNT 27 (SUTURE) ×1 IMPLANT
TOWEL OR 17X24 6PK STRL BLUE (TOWEL DISPOSABLE) ×3 IMPLANT
TOWEL OR 17X26 10 PK STRL BLUE (TOWEL DISPOSABLE) ×3 IMPLANT
TRAY CATH 16FR W/PLASTIC CATH (SET/KITS/TRAYS/PACK) ×3 IMPLANT
TRAY FOLEY W/METER SILVER 16FR (SET/KITS/TRAYS/PACK) IMPLANT
WATER STERILE IRR 1000ML POUR (IV SOLUTION) ×6 IMPLANT

## 2017-08-05 NOTE — Brief Op Note (Signed)
08/05/2017  1:28 PM  PATIENT:  [name]  61 y.o. male  PRE-OPERATIVE DIAGNOSIS:  left hip osteoarthritis  POST-OPERATIVE DIAGNOSIS:  left hip osteoarthritis  PROCEDURE:  Procedure(s): LEFT TOTAL HIP ARTHROPLASTY ANTERIOR APPROACH (Left)  SURGEON:  Surgeon(s) and Role:    Mcarthur Rossetti, MD - Primary  PHYSICIAN ASSISTANT: Benita Stabile, PA-C  ANESTHESIA:   spinal  EBL:  Total I/O In: -  Out: 310 [Urine:60; Blood:250]  COUNTS:  YES  DICTATION: .Other Dictation: Dictation Number [id number]  PLAN OF CARE: Admit to inpatient   PATIENT DISPOSITION:  PACU - hemodynamically stable.   Delay start of Pharmacological VTE agent (>24hrs) due to surgical blood loss or risk of bleeding: no

## 2017-08-05 NOTE — H&P (Signed)
TOTAL HIP ADMISSION H&P  Patient is admitted for left total hip arthroplasty.  Subjective:  Chief Complaint: left hip pain  HPI: George Leon, 61 y.o. male, has a history of pain and functional disability in the left hip(s) due to arthritis and patient has failed non-surgical conservative treatments for greater than 12 weeks to include NSAID's and/or analgesics, corticosteriod injections, use of assistive devices, weight reduction as appropriate and activity modification.  Onset of symptoms was gradual starting 2 years ago with rapidlly worsening course since that time.The patient noted no past surgery on the left hip(s).  Patient currently rates pain in the left hip at 10 out of 10 with activity. Patient has night pain, worsening of pain with activity and weight bearing, trendelenberg gait, pain that interfers with activities of daily living and pain with passive range of motion. Patient has evidence of subchondral cysts, subchondral sclerosis, periarticular osteophytes and joint space narrowing by imaging studies. This condition presents safety issues increasing the risk of falls.  There is no current active infection.  Patient Active Problem List   Diagnosis Date Noted  . Unilateral primary osteoarthritis, left hip 07/02/2017  . Pain in left hip 07/02/2017  . Anemia 05/12/2017  . Other tear of medial meniscus, current injury, left knee, subsequent encounter 04/21/2017  . Chronic pain of left knee 04/21/2017  . Chronic back pain 02/12/2017  . Pulmonary hypertension ([place]) 09/13/2016  . Hyperlipidemia 09/13/2016  . Hyponatremia 04/15/2016  . Muscle cramps 04/15/2016  . Left hip pain 03/26/2016  . Cervical neck pain with evidence of disc disease 01/18/2016  . Physical debility 12/01/2015  . Chronic pain syndrome 12/01/2015  . Tobacco abuse   . Coronary artery disease involving native coronary artery of native heart without angina pectoris   . Olecranon bursitis of right elbow  09/21/2015  . Chronic venous insufficiency 06/19/2015  . Diastolic CHF ([place]) 71/24/5809  . Proteinuria 05/22/2015  . Macular degeneration, 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  . Bilateral lower extremity edema 05/11/2015  . Osteoarthritis of left knee 12/28/2014  . Osteoarthritis of right knee 12/28/2014  . Peripheral vascular disease ([place]) 08/23/2013  . Disorder of SI (sacroiliac) joint 03/30/2013  . GANGLION CYST, WRIST, LEFT 11/16/2010  . Chronic obstructive pulmonary disease ([place]) 10/26/2010  . Osteoarthritis of multiple joints 08/25/2008  . OBESITY, MORBID 05/26/2008  . GERD 06/19/2007  . TOBACCO ABUSE 04/07/2007  . Preventative health care 04/07/2007  . HYPERCHOLESTEROLEMIA 02/16/2007  . HYPERTENSION, BENIGN 02/16/2007   Past Medical History:  Diagnosis Date  . Acute MI ([place])   . Acute on chronic diastolic heart failure ([place]) 12/01/2015  . Arthritis   . CHF (congestive heart failure) ([place])   . Chronic pain   . Chronic pain syndrome 12/01/2015  . COPD (chronic obstructive pulmonary disease) ([place])   . Dyspnea    w/ exertion   . High cholesterol   . History of hiatal hernia    possible    . Hypertension   . Liver fatty degeneration   . Macular degeneration, right eye   . Pneumonia    2016    Past Surgical History:  Procedure Laterality Date  . BACK SURGERY    . [place]  . KNEE ARTHROSCOPY Left 05/27/2017   Procedure: LEFT KNEE ARTHROSCOPY WITH PARTIAL MEDIAL MENISCECTOMY;  Surgeon: Mcarthur Rossetti, MD;  Location: [facility];  Service: Orthopedics;  Laterality: Left;    Prescriptions Prior to Admission  Medication Sig Dispense Refill Last Dose  . albuterol (  PROAIR HFA) 108 (90 Base) MCG/ACT inhaler Inhale 1-2 puffs into the lungs every 4 (four) hours as needed. As needed for shortness of breath /wheeze/cough 3 each 2 08/05/2017 at Unknown time  . amLODipine (NORVASC) 10 MG tablet Take 0.5 tablets (5 mg total) by mouth daily. 90 tablet 1 08/05/2017 at Unknown  time  . aspirin EC 81 MG tablet Take 1 tablet (81 mg total) by mouth daily. 30 tablet 11 Past Week at Unknown time  . atorvastatin (LIPITOR) 80 MG tablet Take 1 tablet (80 mg total) by mouth daily. (Patient taking differently: Take 80 mg by mouth daily at 6 PM. ) 90 tablet 3 08/04/2017 at Unknown time  . DULoxetine (CYMBALTA) 60 MG capsule Take 1 capsule (60 mg total) by mouth daily. 90 capsule 3 Past Month at Unknown time  . esomeprazole (NEXIUM) 40 MG capsule TAKE ONE CAPSULE BY MOUTH ONCE DAILY AS NEEDED FOR  INDIGESTION (Patient taking differently: Take 40 mg by mouth daily as needed (indigestion). ) 30 capsule 0 08/05/2017 at Unknown time  . HYDROcodone-acetaminophen (NORCO/VICODIN) 5-325 MG tablet Take 2 tablets by mouth 3 (three) times daily as needed for pain.  0 08/05/2017 at Unknown time  . ibuprofen (ADVIL,MOTRIN) 200 MG tablet Take 400 mg by mouth every 6 (six) hours as needed for mild pain.   Past Week at Unknown time  . losartan (COZAAR) 100 MG tablet Take 1 tablet (100 mg total) by mouth daily. 90 tablet 0 08/05/2017 at Unknown time  . sodium chloride (OCEAN) 0.65 % SOLN nasal spray Place 1 spray into both nostrils as needed for congestion.   Past Week at Unknown time  . Elastic Bandages & Supports (MEDICAL COMPRESSION STOCKINGS) MISC 1 each by Does not apply route once. 2 each 0 Taking  . fish oil-omega-3 fatty acids 1000 MG capsule Take 1 g by mouth daily.    Unknown at Unknown time  . Fluticasone-Umeclidin-Vilant (TRELEGY ELLIPTA) 100-62.5-25 MCG/INH AEPB Inhale 1 puff into the lungs daily. (Patient not taking: Reported on 07/24/2017) 60 each 2 Not Taking at Unknown time  . Multiple Vitamins-Minerals (OCUVITE ADULT FORMULA) CAPS Take 1 capsule by mouth daily. 90 capsule 3 Unknown at Unknown time  . nystatin ointment (MYCOSTATIN) Apply 1 application topically 2 (two) times daily. (Patient taking differently: Apply 1 application topically 2 (two) times daily as needed (rash). ) 30 g 0 Unknown  at Unknown time  . oxyCODONE-acetaminophen (ROXICET) 5-325 MG tablet Take 1-2 tablets by mouth every 4 (four) hours as needed. (Patient not taking: Reported on 07/24/2017) 60 tablet 0 Not Taking at Unknown time   Allergies  Allergen Reactions  . Sulfamethoxazole Swelling    Tongue Swelling  . Adhesive [Tape] Rash    Paper tape is what bothers him Pulled skin off also  . Hydrochlorothiazide Other (See Comments)    Back Pain & gives him kidney stones    Social History  Substance Use Topics  . Smoking status: Current Every Day Smoker    Packs/day: 1.00    Years: 44.00    Types: Cigarettes  . Smokeless tobacco: Never Used  . Alcohol use No     Comment: 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    History reviewed. No pertinent family history.   Review of Systems  Musculoskeletal: Positive for back pain and joint pain.  All other systems reviewed and are negative.   Objective:  Physical Exam  Constitutional: He is oriented to person, place, and time.  He appears well-developed and well-nourished.  HENT:  Head: Normocephalic and atraumatic.  Eyes: Pupils are equal, round, and reactive to light. EOM are normal.  Neck: Normal range of motion. Neck supple.  Cardiovascular: Normal rate and regular rhythm.   Respiratory: Effort normal and breath sounds normal.  GI: Soft. Bowel sounds are normal.  Musculoskeletal:       Left hip: He exhibits decreased range of motion, decreased strength, tenderness and bony tenderness.  Neurological: He is alert and oriented to person, place, and time.  Skin: Skin is warm and dry.  Psychiatric: He has a normal mood and affect.    Vital signs in last 24 hours: Temp:  [98.4 F (36.9 C)] 98.4 F (36.9 C) (08/27 1402) Pulse Rate:  [94] 94 (08/27 1402) BP: (122)/(72) 122/72 (08/27 1402) SpO2:  [95 %] 95 % (08/27 1402) Weight:  [257 lb (116.6 kg)] 257 lb (116.6 kg) (08/27 1402)  Labs:   Estimated body mass index is 39.08 kg/m as calculated  from the following:   Height as of 08/04/17: 5\' 8"  (1.727 m).   Weight as of 08/04/17: 257 lb (116.6 kg).   Imaging Review Plain radiographs demonstrate severe degenerative joint disease of the left hip(s). The bone quality appears to be good for age and reported activity level.  Assessment/Plan:  End stage arthritis, left hip(s)  The patient history, physical examination, clinical judgement of the provider and imaging studies are consistent with end stage degenerative joint disease of the left hip(s) and total hip arthroplasty is deemed medically necessary. The treatment options including medical management, injection therapy, arthroscopy and arthroplasty were discussed at length. The risks and benefits of total hip arthroplasty were presented and reviewed. The risks due to aseptic loosening, infection, stiffness, dislocation/subluxation,  thromboembolic complications and other imponderables were discussed.  The patient acknowledged the explanation, agreed to proceed with the plan and consent was signed. Patient is being admitted for inpatient treatment for surgery, pain control, PT, OT, prophylactic antibiotics, VTE prophylaxis, progressive ambulation and ADL's and discharge planning.The patient is planning to be discharged home with home health services

## 2017-08-05 NOTE — Anesthesia Procedure Notes (Signed)
Spinal  Patient location during procedure: OR Start time: 08/05/2017 11:58 AM End time: 08/05/2017 12:08 PM Staffing Anesthesiologist: Duane Boston Performed: anesthesiologist  Preanesthetic Checklist Completed: patient identified, surgical consent, pre-op evaluation, timeout performed, IV checked, risks and benefits discussed and monitors and equipment checked Spinal Block Patient position: sitting Prep: DuraPrep Patient monitoring: cardiac monitor, continuous pulse ox and blood pressure Approach: midline Location: L2-3 Injection technique: single-shot Needle Needle type: Pencan  Needle gauge: 24 G Needle length: 9 cm Additional Notes Functioning IV was confirmed and monitors were applied. Sterile prep and drape, including hand hygiene and sterile gloves were used. The patient was positioned and the spine was prepped. The skin was anesthetized with lidocaine.  Free flow of clear CSF was obtained prior to injecting local anesthetic into the CSF.  The spinal needle aspirated freely following injection.  The needle was carefully withdrawn.  The patient tolerated the procedure well.

## 2017-08-05 NOTE — Anesthesia Postprocedure Evaluation (Signed)
Anesthesia Post Note  Patient: George Leon  Procedure(s) Performed: Procedure(s) (LRB): LEFT TOTAL HIP ARTHROPLASTY ANTERIOR APPROACH (Left)     Patient location during evaluation: PACU Anesthesia Type: Spinal Level of consciousness: awake and alert Pain management: pain level controlled Vital Signs Assessment: post-procedure vital signs reviewed and stable Respiratory status: spontaneous breathing and respiratory function stable Cardiovascular status: blood pressure returned to baseline and stable Postop Assessment: spinal receding Anesthetic complications: no    Last Vitals:  Vitals:   08/05/17 1440 08/05/17 1445  BP: (!) 87/68   Pulse: 82 79  Resp: (!) 23 (!) 22  Temp:    SpO2: 98% 99%    Last Pain:  Vitals:   08/05/17 1350  TempSrc: Tympanic  PainSc:                  [name] DANIEL

## 2017-08-05 NOTE — Transfer of Care (Signed)
Immediate Anesthesia Transfer of Care Note  Patient: George Leon  Procedure(s) Performed: Procedure(s): LEFT TOTAL HIP ARTHROPLASTY ANTERIOR APPROACH (Left)  Patient Location: PACU  Anesthesia Type:Spinal  Level of Consciousness: drowsy and patient cooperative  Airway & Oxygen Therapy: Patient Spontanous Breathing and Patient connected to face mask oxygen  Post-op Assessment: Report given to RN and Post -op Vital signs reviewed and stable  Post vital signs: Reviewed and stable  Last Vitals:  Vitals:   08/05/17 1347 08/05/17 1350  Temp: 36.6 C 36.6 C    Last Pain:  Vitals:   08/05/17 1350  TempSrc: Tympanic  PainSc:          Complications: No apparent anesthesia complications

## 2017-08-05 NOTE — Anesthesia Preprocedure Evaluation (Addendum)
Anesthesia Evaluation  Patient identified by MRN, date of birth, ID band Patient awake    Reviewed: Allergy & Precautions, NPO status , Patient's Chart, lab work & pertinent test results  Airway Mallampati: II  TM Distance: <3 FB Neck ROM: Full    Dental  (+) Dental Advisory Given, Edentulous Upper, Edentulous Lower   Pulmonary COPD,  COPD inhaler, Current Smoker,    Pulmonary exam normal breath sounds clear to auscultation       Cardiovascular hypertension, Pt. on medications (-) angina+ CAD, + Past MI and +CHF  Normal cardiovascular exam Rhythm:Regular Rate:Normal  EKG 05/19/2017: NSR. RBBB. LAFB. Appears stable when compared to EKG 11/28/15.  Echo 11/26/15: - Left ventricle: The cavity size was normal. There was mildconcentric hypertrophy. Systolic function was normal. Theestimated ejection fraction was in the range of 55% to 60%. Wallmotion was normal; there were no regional wall motionabnormalities. Doppler parameters are consistent with abnormalleft ventricular relaxation (grade 1 diastolic dysfunction).Doppler parameters are consistent with elevated ventricularend-diastolic filling pressure. - Aortic valve: Trileaflet; mildly thickened, mildly calcified leaflets. There was no regurgitation. - Aortic root: The aortic root was normal in size. - Right ventricle: The cavity size was normal. Wall thickness wasnormal. Systolic function was normal. - Right atrium: The atrium was mildly dilated. - Tricuspid valve: Structurally normal valve. There was moderateregurgitation. - Pulmonic valve: There was no regurgitation. - Pulmonary arteries: Systolic pressure was moderately increased.PA peak pressure: 48 mm Hg (S). - Inferior vena cava: The vessel was dilated. The respirophasicdiameter changes were blunted (<50%), consistent with elevatedcentral venous pressure. - Pericardium, extracardiac: There was no pericardial  effusion.   Neuro/Psych negative neurological ROS  negative psych ROS   GI/Hepatic Neg liver ROS, hiatal hernia, GERD  Medicated,  Endo/Other  Obesity   Renal/GU negative Renal ROS     Musculoskeletal  (+) Arthritis , Fibromyalgia -  Abdominal   Peds  Hematology Plt [phone]   Anesthesia Other Findings Day of surgery medications reviewed with the patient.  Reproductive/Obstetrics                            Anesthesia Physical Anesthesia Plan  ASA: III  Anesthesia Plan: Spinal   Post-op Pain Management:    Induction: Intravenous  PONV Risk Score and Plan: 0 and Propofol infusion  Airway Management Planned:   Additional Equipment:   Intra-op Plan:   Post-operative Plan:   Informed Consent: I have reviewed the patients History and Physical, chart, labs and discussed the procedure including the risks, benefits and alternatives for the proposed anesthesia with the patient or authorized representative who has indicated his/her understanding and acceptance.   Dental advisory given  Plan Discussed with: CRNA, Anesthesiologist and Surgeon  Anesthesia Plan Comments: (Discussed risks and benefits of and differences between spinal and general. Discussed risks of spinal including headache, backache, failure, bleeding, infection, and nerve damage. Patient consents to spinal. Questions answered. Coagulation studies and platelet count acceptable.)        Anesthesia Quick Evaluation

## 2017-08-06 ENCOUNTER — Encounter (HOSPITAL_COMMUNITY): Payer: Self-pay | Admitting: Orthopaedic Surgery

## 2017-08-06 DIAGNOSIS — M1612 Unilateral primary osteoarthritis, left hip: Secondary | ICD-10-CM

## 2017-08-06 LAB — BASIC METABOLIC PANEL
ANION GAP: 9 (ref 5–15)
BUN: 10 mg/dL (ref 6–20)
CALCIUM: 8.2 mg/dL — AB (ref 8.9–10.3)
CO2: 25 mmol/L (ref 22–32)
CREATININE: 0.79 mg/dL (ref 0.61–1.24)
Chloride: 101 mmol/L (ref 101–111)
GFR calc Af Amer: >60 mL/min (ref >60)
GFR calc non Af Amer: >60 mL/min (ref >60)
GLUCOSE: 109 mg/dL — AB (ref 65–99)
Potassium: 3.9 mmol/L (ref 3.5–5.1)
Sodium: 135 mmol/L (ref 135–145)

## 2017-08-06 LAB — CBC
HEMATOCRIT: 37 % — AB (ref 39.0–52.0)
Hemoglobin: 11.5 g/dL — ABNORMAL LOW (ref 13.0–17.0)
MCH: 28.1 pg (ref 26.0–34.0)
MCHC: 31.1 g/dL (ref 30.0–36.0)
MCV: 90.5 fL (ref 78.0–100.0)
Platelets: 246 10*3/uL (ref 150–400)
RBC: 4.09 MIL/uL — ABNORMAL LOW (ref 4.22–5.81)
RDW: 15.1 % (ref 11.5–15.5)
WBC: 15.2 10*3/uL — ABNORMAL HIGH (ref 4.0–10.5)

## 2017-08-06 NOTE — Evaluation (Addendum)
Occupational Therapy Evaluation Patient Details Name: George Leon MRN: [id number] DOB: [birth year] Today's Date: 08/06/2017    History of Present Illness Pt is a 61 y.o. male s/p L total hip arthroplasty anterior approach secondary to L hip pain. PMH significant for: acute MI, acute on chronic diastolic heart failure, arthritis, CHF, chronic pain, COPD, dyspnea with exertion, high cholesterol, hypertension, liver fatty degeneration, R eye macular degeneration.   Clinical Impression   PTA, pt was independent with ADL and functional mobility. He currently requires min assist for LB ADL and min guard assist for toilet transfers. This session limited by significant pain and notified RN who plans to provide medication. Additionally, pt presents with decreased activity tolerance for ADL with significant dyspnea on exertion. Pt would benefit from continued OT services while admitted in order to maximize independence with ADL and functional mobility with next session to focus on safe use of DME for tub transfers and energy conservation techniques. Pt has all equipment needs met and do not anticipate need for OT follow-up post-acute D/C. George continue to follow acutely.    Follow Up Recommendations  No OT follow up;Supervision/Assistance - 24 hour    Equipment Recommendations  None recommended by OT (Has needs met)    Recommendations for Other Services       Precautions / Restrictions Precautions Precautions: Fall Precaution Comments: Shortness of breath with all activity.  Restrictions Weight Bearing Restrictions: Yes LLE Weight Bearing: Weight bearing as tolerated      Mobility Bed Mobility Overal bed mobility: Needs Assistance Bed Mobility: Supine to Sit     Supine to sit: Min assist     General bed mobility comments: Min assist to manage L LE.  Transfers Overall transfer level: Needs assistance Equipment used: Rolling walker (2 wheeled) Transfers: Sit to/from Stand Sit to  Stand: Min guard         General transfer comment: Min guard assist for safety.    Balance Overall balance assessment: Needs assistance Sitting-balance support: No upper extremity supported;Feet supported Sitting balance-Leahy Scale: Fair     Standing balance support: Bilateral upper extremity supported;During functional activity Standing balance-Leahy Scale: Poor Standing balance comment: Reliant on at least single UE support in standing.                            ADL either performed or assessed with clinical judgement   ADL   Eating/Feeding: Set up;Sitting   Grooming: Set up;Sitting   Upper Body Bathing: Set up;Sitting   Lower Body Bathing: Minimal assistance;Sit to/from stand   Upper Body Dressing : Set up;Sitting   Lower Body Dressing: Minimal assistance;Sit to/from stand   Toilet Transfer: Min guard;Ambulation;BSC Toilet Transfer Details (indicate cue type and reason): Taking a few steps. Limited by pain.  Toileting- Water quality scientist and Hygiene: Min guard;Sit to/from stand       Functional mobility during ADLs: Min guard;Rolling walker General ADL Comments: Pt limited by pain this session (notified RN). He reports ambulating to bathroom prior to session. Educated concerning safe toilet transfers and dressing techniques. Too painful to attempt tub transfer at this time.      Vision Baseline Vision/History: Macular Degeneration;Wears glasses (R eye) Wears Glasses: Reading only Patient Visual Report: No change from baseline Vision Assessment?: No apparent visual deficits     Perception     Praxis      Pertinent Vitals/Pain Pain Assessment: 0-10 Pain Score: 10-Worst pain ever (9.5) Pain Location:  L hip Pain Descriptors / Indicators: Aching;Sore Pain Intervention(s): Limited activity within patient's tolerance;Monitored during session;Repositioned;Patient requesting pain meds-RN notified     Hand Dominance Right   Extremity/Trunk  Assessment Upper Extremity Assessment Upper Extremity Assessment: Overall WFL for tasks assessed   Lower Extremity Assessment Lower Extremity Assessment: LLE deficits/detail LLE Deficits / Details: Decreased strength and ROM as expected post-operatively.       Communication Communication Communication: No difficulties   Cognition Arousal/Alertness: Awake/alert Behavior During Therapy: WFL for tasks assessed/performed Overall Cognitive Status: Within Functional Limits for tasks assessed                                     General Comments       Exercises     Shoulder Instructions      Home Living Family/patient expects to be discharged to:: Private residence Living Arrangements: Spouse/significant other;Children Available Help at Discharge: Family;Available 24 hours/day Type of Home: Apartment Home Access: Level entry     Home Layout: One level     Bathroom Shower/Tub: Teacher, early years/pre: Standard     Home Equipment: Environmental consultant - 2 wheels;Cane - single point;Crutches;Wheelchair - manual;Bedside commode (lift chair)          Prior Functioning/Environment Level of Independence: Independent        Comments: Reports shortness of breath with activity at baseline. Cooks and cleans.         OT Problem List: Decreased strength;Decreased range of motion;Impaired balance (sitting and/or standing);Decreased activity tolerance;Decreased safety awareness;Decreased knowledge of precautions;Decreased knowledge of use of DME or AE;Pain      OT Treatment/Interventions: Self-care/ADL training;Therapeutic exercise;Energy conservation;Patient/family education;Therapeutic activities    OT Goals(Current goals can be found in the care plan section) Acute Rehab OT Goals Patient Stated Goal: less pain OT Goal Formulation: With patient Time For Goal Achievement: 08/13/17 Potential to Achieve Goals: Good  OT Frequency: Min 2X/week   Barriers to D/C:             Co-evaluation              AM-PAC PT "6 Clicks" Daily Activity     Outcome Measure Help from another person eating meals?: None Help from another person taking care of personal grooming?: None Help from another person toileting, which includes using toliet, bedpan, or urinal?: A Little Help from another person bathing (including washing, rinsing, drying)?: A Little Help from another person to put on and taking off regular upper body clothing?: None Help from another person to put on and taking off regular lower body clothing?: A Little 6 Click Score: 21   End of Session Equipment Utilized During Treatment: Gait belt;Rolling walker;Oxygen (2 L O2) Nurse Communication: Mobility status;Patient requests pain meds  Activity Tolerance: Patient limited by pain Patient left: in chair;with call bell/phone within reach  OT Visit Diagnosis: Unsteadiness on feet (R26.81) Pain - Right/Left: Left Pain - part of body: Hip                Time: 7579-7282 OT Time Calculation (min): 22 min Charges:  OT General Charges $OT Visit: 1 Visit OT Evaluation $OT Eval Moderate Complexity: 1 Mod G-Codes:     Norman Herrlich, MS OTR/L  Pager: [place] A Jaina Morin 08/06/2017, 9:19 AM

## 2017-08-06 NOTE — Progress Notes (Signed)
Subjective: 1 Day Post-Op Procedure(s) (LRB): LEFT TOTAL HIP ARTHROPLASTY ANTERIOR APPROACH (Left) Patient reports pain as moderate.    Objective: Vital signs in last 24 hours: Temp:  [97.9 F (36.6 C)-98.8 F (37.1 C)] 98.8 F (37.1 C) (08/29 0626) Pulse Rate:  [78-104] 101 (08/29 0626) Resp:  [19-31] 20 (08/29 0626) BP: (65-125)/(45-72) 125/68 (08/29 0626) SpO2:  [91 %-100 %] 95 % (08/29 0626)  Intake/Output from previous day: 08/28 0701 - 08/29 0700 In: 550 [I.V.:500; IV Piggyback:50] Out: 810 [Urine:560; Blood:250] Intake/Output this shift: No intake/output data recorded.  No results for input(s): HGB in the last 72 hours. No results for input(s): WBC, RBC, HCT, PLT in the last 72 hours. No results for input(s): NA, K, CL, CO2, BUN, CREATININE, GLUCOSE, CALCIUM in the last 72 hours. No results for input(s): LABPT, INR in the last 72 hours.  Sensation intact distally Intact pulses distally Dorsiflexion/Plantar flexion intact Incision: dressing C/D/I  Assessment/Plan: 1 Day Post-Op Procedure(s) (LRB): LEFT TOTAL HIP ARTHROPLASTY ANTERIOR APPROACH (Left) Up with therapy Plan for discharge tomorrow Discharge home with home health  Mcarthur Rossetti 08/06/2017, 7:07 AM

## 2017-08-06 NOTE — Evaluation (Signed)
Physical Therapy Evaluation Patient Details Name: George Leon MRN: [id number] DOB: 1956/05/23 Today's Date: 08/06/2017   History of Present Illness  Pt is a 61 y.o. male s/p L total hip arthroplasty anterior approach secondary to L hip pain. PMH significant for: acute MI, acute on chronic diastolic heart failure, arthritis, CHF, chronic pain, COPD, dyspnea with exertion, high cholesterol, hypertension, liver fatty degeneration, R eye macular degeneration.  Clinical Impression  Pt very limited by L hip pain at "15/10" and SOB. Pts SpO2 on RA in the 80s. +SOB with activity. Pt with 24/7 assist and good home set up but will need to increased transfer ability and ambulation tolerance prior to d/c home.    Follow Up Recommendations Home health PT;Supervision/Assistance - 24 hour    Equipment Recommendations  Rolling walker with 5" wheels    Recommendations for Other Services       Precautions / Restrictions Precautions Precautions: Fall Precaution Comments: Shortness of breath with all activity.  Restrictions Weight Bearing Restrictions: Yes LLE Weight Bearing: Weight bearing as tolerated      Mobility  Bed Mobility Overal bed mobility: Needs Assistance Bed Mobility: Sit to Supine     Supine to sit: Min assist Sit to supine: Min assist   General bed mobility comments: minA for L LE mangement, pt reports he typlically sleeps in his lift chair  Transfers Overall transfer level: Needs assistance Equipment used: Rolling walker (2 wheeled) Transfers: Sit to/from Stand Sit to Stand: Min guard         General transfer comment: v/c's for safe hand placement, increased time, labored effort  Ambulation/Gait Ambulation/Gait assistance: Min guard Ambulation Distance (Feet): 65 Feet Assistive device: Rolling walker (2 wheeled) Gait Pattern/deviations: Step-to pattern;Decreased stride length;Step-through pattern;Antalgic Gait velocity: slow Gait velocity interpretation:  Below normal speed for age/gender General Gait Details: pt initially wiht inability to clear L foot however progressed to being able to beging a step through gait pattern for 10' sections prior to taking a rest. Pt with SOB, SpO2 down to 81% on RA, 87% on 2Lo2 via [place]. pt with increased L LE pain limiting WBing tolerance  Stairs            Wheelchair Mobility    Modified Rankin (Stroke Patients Only)       Balance Overall balance assessment: Needs assistance Sitting-balance support: No upper extremity supported;Feet supported Sitting balance-Leahy Scale: Fair     Standing balance support: Bilateral upper extremity supported;During functional activity Standing balance-Leahy Scale: Poor Standing balance comment: Reliant on at least single UE support in standing.                              Pertinent Vitals/Pain Pain Assessment: 0-10 Pain Score: 10-Worst pain ever Pain Location: L hip Pain Descriptors / Indicators: Aching;Sore Pain Intervention(s): Monitored during session    Home Living Family/patient expects to be discharged to:: Private residence Living Arrangements: Spouse/significant other;Children Available Help at Discharge: Family;Available 24 hours/day Type of Home: Apartment Home Access: Level entry     Home Layout: One level Home Equipment: Walker - 2 wheels;Cane - single point;Crutches;Wheelchair - manual;Bedside commode (lift chair)      Prior Function Level of Independence: Independent         Comments: Reports shortness of breath with activity at baseline. Cooks and cleans.      Hand Dominance   Dominant Hand: Right    Extremity/Trunk Assessment   Upper Extremity  Assessment Upper Extremity Assessment: Overall WFL for tasks assessed    Lower Extremity Assessment Lower Extremity Assessment: LLE deficits/detail LLE Deficits / Details: able to complete LAQ but labored effort limited by pain, no active L hip flexion    Cervical  / Trunk Assessment Cervical / Trunk Assessment: Normal  Communication   Communication: No difficulties  Cognition Arousal/Alertness: Awake/alert Behavior During Therapy: WFL for tasks assessed/performed Overall Cognitive Status: Within Functional Limits for tasks assessed                                        General Comments      Exercises General Exercises - Lower Extremity Ankle Circles/Pumps: AROM;Both;10 reps;Seated Long Arc Quad: AROM;Left;10 reps;Supine   Assessment/Plan    PT Assessment Patient needs continued PT services  PT Problem List Decreased strength;Decreased range of motion;Decreased activity tolerance;Decreased balance;Decreased mobility       PT Treatment Interventions DME instruction;Gait training;Functional mobility training;Therapeutic activities;Therapeutic exercise;Balance training    PT Goals (Current goals can be found in the Care Plan section)  Acute Rehab PT Goals Patient Stated Goal: less pain PT Goal Formulation: With patient Time For Goal Achievement: 08/13/17 Potential to Achieve Goals: Good    Frequency 7X/week   Barriers to discharge        Co-evaluation               AM-PAC PT "6 Clicks" Daily Activity  Outcome Measure Difficulty turning over in bed (including adjusting bedclothes, sheets and blankets)?: Unable Difficulty moving from lying on back to sitting on the side of the bed? : Unable Difficulty sitting down on and standing up from a chair with arms (e.g., wheelchair, bedside commode, etc,.)?: A Little Help needed moving to and from a bed to chair (including a wheelchair)?: A Little Help needed walking in hospital room?: A Little Help needed climbing 3-5 steps with a railing? : A Lot 6 Click Score: 13    End of Session Equipment Utilized During Treatment: Gait belt Activity Tolerance: Patient limited by pain Patient left: in bed;with call bell/phone within reach;with family/visitor present Nurse  Communication: Mobility status (pt's IV in R UE began leaking blood) PT Visit Diagnosis: Difficulty in walking, not elsewhere classified (R26.2)    Time: 7591-6384 PT Time Calculation (min) (ACUTE ONLY): 28 min   Charges:   PT Evaluation $PT Eval Moderate Complexity: 1 Mod PT Treatments $Gait Training: 8-22 mins   PT G Codes:        Kittie Plater, PT, DPT Pager #: [phone] Office #: [phone]   [place] 08/06/2017, 12:52 PM

## 2017-08-06 NOTE — Plan of Care (Signed)
Problem: Pain Managment: Goal: General experience of comfort will improve Outcome: Progressing Patient voices understanding of pain scale and calls for pain medication when needed   

## 2017-08-06 NOTE — Progress Notes (Signed)
Physical Therapy Treatment Note  Clinical Impression: Pt sleeping in chair upon PT arrival but con't to report 10/10 L hip pain. Pt with improved ambulation tolerance and ability to advance and clear L LE during ambulation. Pt given HEP, son present and to assist. Pt lethargic and immediately fell back to sleep upon sitting in chair.    08/06/17 1400  PT Visit Information  Last PT Received On 08/06/17  Assistance Needed +1  History of Present Illness Pt is a 61 y.o. male s/p L total hip arthroplasty anterior approach secondary to L hip pain. PMH significant for: acute MI, acute on chronic diastolic heart failure, arthritis, CHF, chronic pain, COPD, dyspnea with exertion, high cholesterol, hypertension, liver fatty degeneration, R eye macular degeneration.  Precautions  Precautions Fall  Precaution Comments sob  Restrictions  Weight Bearing Restrictions Yes  LLE Weight Bearing WBAT  Pain Assessment  Pain Assessment 0-10  Pain Score 10  Pain Location L hip  Pain Descriptors / Indicators Aching;Sore  Pain Intervention(s) Monitored during session  Cognition  Arousal/Alertness Awake/alert  Behavior During Therapy WFL for tasks assessed/performed  Overall Cognitive Status Within Functional Limits for tasks assessed  Bed Mobility  General bed mobility comments pt sleeping up in chair upon PT arrival  Transfers  Overall transfer level Needs assistance  Equipment used Rolling walker (2 wheeled)  Transfers Sit to/from Stand  Sit to Stand Min guard  General transfer comment v/c's for safe hand placement, increased time, labored effort  Ambulation/Gait  Ambulation/Gait assistance Min guard  Ambulation Distance (Feet) 100 Feet  Assistive device Rolling walker (2 wheeled)  Gait Pattern/deviations Step-to pattern;Decreased stride length;Step-through pattern;Antalgic  General Gait Details pt with improved ability to clear L LE and was able to demo a more fluid gait pattern, pt remains to have  L hip pain and very dependent on bilat UEs. = SOB, SpO2 .93% on 2LO2 via [place]  Gait velocity slow  Gait velocity interpretation Below normal speed for age/gender  Balance  Standing balance support Bilateral upper extremity supported;During functional activity  Standing balance-Leahy Scale Poor  Standing balance comment Reliant on at least single UE support in standing.   Exercises  Exercises General Lower Extremity  General Exercises - Lower Extremity  Long Arc Quad AROM;Left;10 reps;Seated  Quad Sets AROM;Both;10 reps;Seated (LE elevated)  PT - End of Session  Equipment Utilized During Treatment Gait belt  Activity Tolerance Patient limited by pain  Patient left in bed;with call bell/phone within reach;with family/visitor present  Nurse Communication Mobility status  PT - Assessment/Plan  PT Plan Current plan remains appropriate  PT Visit Diagnosis Difficulty in walking, not elsewhere classified (R26.2)  PT Frequency (ACUTE ONLY) 7X/week  Follow Up Recommendations Home health PT;Supervision/Assistance - 24 hour  PT equipment Rolling walker with 5" wheels  AM-PAC PT "6 Clicks" Daily Activity Outcome Measure  Difficulty turning over in bed (including adjusting bedclothes, sheets and blankets)? 1  Difficulty moving from lying on back to sitting on the side of the bed?  1  Difficulty sitting down on and standing up from a chair with arms (e.g., wheelchair, bedside commode, etc,.)? 3  Help needed moving to and from a bed to chair (including a wheelchair)? 3  Help needed walking in hospital room? 3  Help needed climbing 3-5 steps with a railing?  2  6 Click Score 13  Mobility G Code  CK  PT Goal Progression  Progress towards PT goals Progressing toward goals  PT Time Calculation  PT  Start Time (ACUTE ONLY) 1414  PT Stop Time (ACUTE ONLY) 1430  PT Time Calculation (min) (ACUTE ONLY) 16 min  PT General Charges  $$ ACUTE PT VISIT 1 Visit  PT Treatments  $Gait Training 8-22 mins    Kittie Plater, PT, DPT Pager #: [phone] Office #: [phone]

## 2017-08-06 NOTE — Op Note (Signed)
NAME:  George Leon, George Leon                   ACCOUNT NO.:  MEDICAL RECORD NO.:  [id number]  LOCATION:                                 FACILITY:  PHYSICIAN:  Lind Guest. Ninfa Linden, M.D.DATE OF BIRTH:  DATE OF PROCEDURE:  08/05/2017 DATE OF DISCHARGE:                              OPERATIVE REPORT   PREOPERATIVE DIAGNOSIS:  Primary osteoarthritis and degenerative joint disease, left hip.  POSTOPERATIVE DIAGNOSIS:  Primary osteoarthritis and degenerative joint disease, left hip.  PROCEDURE:  Left total hip arthroplasty through direct anterior approach.  IMPLANTS:  DePuy Sector Gription acetabular component size 56, size 36 +0 polyethylene liner, size 14 Corail femoral component with standard offset, size 36 +8.5 ceramic hip ball.  SURGEON:  Lind Guest. Ninfa Linden, M.D.  ASSISTANT:  Erskine Emery, PA-C.  ANESTHESIA:  Spinal.  ANTIBIOTICS:  2 g of IV Ancef.  BLOOD LOSS:  250-300 mL.  COMPLICATIONS:  None.  INDICATIONS:  George Leon is a 61 year old gentleman, well known to me. He has debilitating arthritis involving his left hip.  This has been confirmed with physical exam and x-rays.  His pain is daily and it has detrimentally affected his activities of daily living, his quality of life and his mobility.  This has been going on for a long period of time and he has tried and failed all forms of conservative treatment.  At this point, we have recommended a total hip arthroplasty through direct anterior approach.  We have explained in detail the risk of acute blood loss anemia, nerve and vessel injury, fracture, infection, dislocation, DVT.  He understands our goals are to decrease pain, improve mobility and overall improved quality of life.  PROCEDURE DESCRIPTION:  After informed consent was obtained, appropriate left hip was marked.  He was brought to the operating room, where spinal anesthesia was obtained while he was on the stretcher.  He was laid in supine position on  the stretcher.  Foley catheter was placed and then both feet had traction boots applied to them.  Next, he was placed supine on the Hana fracture table with the perineal post in place and both legs in inline skeletal traction devices, but no traction applied. His left operative hip was prepped and draped with DuraPrep and sterile drapes.  Time-out was called, he was identified as correct patient and correct left hip.  We then made an incision just inferior and posterior to the anterior superior iliac spine and carried this obliquely down the leg.  We dissected down the tensor fascia lata muscle.  The tensor fascia was then divided longitudinally to proceed with a direct anterior approach to the hip.  We identified and cauterized circumflex vessels and identified the hip capsule.  I opened up the hip capsule in an L- type format, finding a moderate joint effusion and significant periarticular osteophytes.  I placed the Cobra retractors within the joint capsule around the medial and lateral femoral neck and then made our femoral neck cut with an oscillating saw just proximal to the lesser trochanter and completed this with an osteotome.  I placed a corkscrew guide in the femoral head and removed the femoral head in its entirety and  found it to be significantly devoid of cartilage.  I placed a bent Hohmann over the medial acetabular rim and then removed remnants of the acetabular labrum and released the transverse acetabular ligament. After cleaned other debris from the hip, we then began reaming under direct visualization from a size 43 reamer all the way up to size 56, with all reamers under direct visualization and the last reamer under direct fluoroscopy, so we could obtain our depth of reaming, our inclination, and anteversion.  Once I was pleased with this, I placed the real DePuy Sector Gription acetabular component size 56 and a 36 +0 polyethylene liner for that size acetabular  component.  Attention was then turned to the femur.  With the leg externally rotated to 120 degrees extended and adducted, we were able to release the lateral joint capsule and used a box cutting osteotome to enter the femoral canal and a rongeur to lateralize.  I did place a bent Hohmann medially and a Hohmann retractor behind the greater trochanter.  We then began broaching from a size 8 broach using the Corail broaching system going up to a size 14.  With the size 14 in place, we trialed a standard offset femoral neck and a 36+ 1.5 hip ball.  We reduced this in the acetabulum and we definitely needed to increase his leg length and offset.  We dislocated the hip and removed the trial components.  We were able to place the real Corail femoral component size 14 with standard offset, and a 36+ 8.5 hip ball.  We brought the leg back over and up with traction and internal rotation reducing the pelvis, and I was pleased with the range of motion, leg length, offset and stability. We then irrigated the hip with normal saline solution using pulsatile lavage.  We closed the joint capsule with interrupted #1 Ethibond suture followed by running #1 Vicryl in the tensor fascia, 0 Vicryl in the deep tissue, 2-0 Vicryl in the subcutaneous tissue, interrupted staples on the skin.  Xeroform and Aquacel dressings applied.  He was taken off the Hana table and taken to the recovery room in stable condition.  All final counts were correct.  There were no complications noted.  Of note, Erskine Emery, PA-C, assisted in the entire case.  His assistance was crucial for facilitating all aspects of this case.     Lind Guest. Ninfa Linden, M.D.     CYB/MEDQ  D:  08/05/2017  T:  08/06/2017  Job:  [id number]

## 2017-08-07 MED ORDER — ASPIRIN 81 MG PO CHEW: 81.0000 mg | CHEWABLE_TABLET | Freq: Two times a day (BID) | ORAL | 0 refills | Status: DC

## 2017-08-07 MED ORDER — OXYCODONE-ACETAMINOPHEN 5-325 MG PO TABS: 1.0000 | ORAL_TABLET | ORAL | 0 refills | Status: DC | PRN

## 2017-08-07 MED ORDER — HYDROCODONE-ACETAMINOPHEN 5-325 MG PO TABS: 1.0000 | ORAL_TABLET | ORAL | 0 refills | Status: DC | PRN

## 2017-08-07 MED ORDER — NICOTINE 21 MG/24HR TD PT24
21.0000 mg | MEDICATED_PATCH | Freq: Every day | TRANSDERMAL | Status: DC
  Administered 2017-08-07 – 2017-08-08 (×2): 21 mg via TRANSDERMAL
  Filled 2017-08-07 (×2): qty 1

## 2017-08-07 MED ORDER — NICOTINE 21 MG/24HR TD PT24: 21.0000 mg | MEDICATED_PATCH | Freq: Every day | TRANSDERMAL | 0 refills | Status: DC

## 2017-08-07 MED ORDER — NICOTINE 21 MG/24HR TD PT24: 21.0000 mg | MEDICATED_PATCH | TRANSDERMAL | 0 refills | Status: DC

## 2017-08-07 MED ORDER — METHOCARBAMOL 500 MG PO TABS: 500.0000 mg | ORAL_TABLET | Freq: Four times a day (QID) | ORAL | 1 refills | Status: DC | PRN

## 2017-08-07 MED ORDER — OXYCODONE HCL 5 MG PO TABS
5.0000 mg | ORAL_TABLET | ORAL | Status: DC | PRN
  Administered 2017-08-07 – 2017-08-08 (×6): 5 mg via ORAL
  Filled 2017-08-07 (×7): qty 1

## 2017-08-07 NOTE — Care Management Note (Signed)
Case Management Note  Patient Details  Name: George Leon MRN: [id number] Date of Birth: 07/16/1956  Subjective/Objective:    61 yr old male s/p left total hip arthroplasty.                Action/Plan:  Case manager spoke with patient concerning discharge plan and DME. Patient was preoperatively setup with [facility], no changes. He has RW and 3in1 at home. Will have family support at discharge.   Expected Discharge Date:    08/07/17              Expected Discharge Plan:  [place]  In-House Referral:  NA  Discharge planning Services  CM Consult  Post Acute Care Choice:  Home Health Choice offered to:  Patient  DME Arranged:   (Has rolling walker and 3in1) DME Agency:  NA  HH Arranged:  IV Antibiotics HH Agency:  [facility] ([facility])  Status of Service:  Completed, signed off  If discussed at [employer] of Stay Meetings, dates discussed:    Additional Comments:  Ninfa Meeker, RN 08/07/2017, 1:25 PM

## 2017-08-07 NOTE — Progress Notes (Signed)
Physical Therapy Treatment Patient Details Name: George Leon MRN: [id number] DOB: 02-06-1956 Today's Date: 08/07/2017    History of Present Illness Pt is a 61 y.o. male s/p L total hip arthroplasty anterior approach secondary to L hip pain. PMH significant for: acute MI, acute on chronic diastolic heart failure, arthritis, CHF, chronic pain, COPD, dyspnea with exertion, high cholesterol, hypertension, liver fatty degeneration, R eye macular degeneration.    PT Comments    Pt limited by pain and SOB. Curing for pursed lipped breathing during Ther ex and ambulation. Pt on 2L of O2 during activity. Plan to progress pt's HEP for next session to increase pt's functional ROM and strength. Will continue to follow acutely.    Follow Up Recommendations  Home health PT;Supervision/Assistance - 24 hour     Equipment Recommendations  Rolling walker with 5" wheels    Recommendations for Other Services       Precautions / Restrictions Precautions Precautions: Fall Precaution Comments: sob Restrictions Weight Bearing Restrictions: Yes LLE Weight Bearing: Weight bearing as tolerated    Mobility  Bed Mobility               General bed mobility comments: In chair on arrival  Transfers Overall transfer level: Needs assistance Equipment used: Rolling walker (2 wheeled) Transfers: Sit to/from Stand Sit to Stand: Min guard         General transfer comment: v/c's for safe hand placement, increased time, labored effort  Ambulation/Gait Ambulation/Gait assistance: Min guard Ambulation Distance (Feet): 100 Feet Assistive device: Rolling walker (2 wheeled) Gait Pattern/deviations: Step-to pattern;Decreased stride length;Step-through pattern;Antalgic Gait velocity: slow Gait velocity interpretation: Below normal speed for age/gender General Gait Details: Pt distance limited by hip pain and SOB. Dependent on bil UE to off load LLE during ambulation. Cues for walker proximity and  pursed lipped breathing.    Stairs            Wheelchair Mobility    Modified Rankin (Stroke Patients Only)       Balance Overall balance assessment: Needs assistance Sitting-balance support: No upper extremity supported;Feet supported Sitting balance-Leahy Scale: Fair     Standing balance support: Bilateral upper extremity supported;During functional activity Standing balance-Leahy Scale: Poor Standing balance comment: Reliant on at least single UE support in standing.                             Cognition Arousal/Alertness: Awake/alert Behavior During Therapy: WFL for tasks assessed/performed Overall Cognitive Status: Within Functional Limits for tasks assessed                                        Exercises General Exercises - Lower Extremity Long Arc Quad: AROM;Left;10 reps;Seated Hip Flexion/Marching: AROM;Both;5 reps;Standing    General Comments        Pertinent Vitals/Pain Pain Assessment: 0-10 Pain Score: 7  Pain Location: L hip Pain Descriptors / Indicators: Aching;Sore Pain Intervention(s): Limited activity within patient's tolerance;Monitored during session;Premedicated before session    Home Living                      Prior Function            PT Goals (current goals can now be found in the care plan section) Acute Rehab PT Goals Patient Stated Goal: less pain PT Goal Formulation: With  patient Time For Goal Achievement: 08/13/17 Potential to Achieve Goals: Good Progress towards PT goals: Progressing toward goals    Frequency    7X/week      PT Plan Current plan remains appropriate    Co-evaluation              AM-PAC PT "6 Clicks" Daily Activity  Outcome Measure  Difficulty turning over in bed (including adjusting bedclothes, sheets and blankets)?: Unable Difficulty moving from lying on back to sitting on the side of the bed? : Unable Difficulty sitting down on and standing up  from a chair with arms (e.g., wheelchair, bedside commode, etc,.)?: Unable Help needed moving to and from a bed to chair (including a wheelchair)?: A Little Help needed walking in hospital room?: A Little Help needed climbing 3-5 steps with a railing? : A Lot 6 Click Score: 11    End of Session Equipment Utilized During Treatment: Gait belt;Oxygen Activity Tolerance: Patient limited by pain Patient left: with call bell/phone within reach;in chair Nurse Communication: Mobility status PT Visit Diagnosis: Difficulty in walking, not elsewhere classified (R26.2)     Time: 2035-5974 PT Time Calculation (min) (ACUTE ONLY): 23 min  Charges:  $Gait Training: 8-22 mins $Therapeutic Exercise: 8-22 mins                    G Codes:       Benjiman Core, [place] Pager [phone] Acute Rehab   Allena Katz 08/07/2017, 10:29 AM

## 2017-08-07 NOTE — Discharge Instructions (Signed)

## 2017-08-07 NOTE — Progress Notes (Signed)
Occupational Therapy Treatment Patient Details Name: George Leon MRN: [id number] DOB: [birth year] Today's Date: 08/07/2017    History of present illness Pt is a 61 y.o. male s/p L total hip arthroplasty anterior approach secondary to L hip pain. PMH significant for: acute MI, acute on chronic diastolic heart failure, arthritis, CHF, chronic pain, COPD, dyspnea with exertion, high cholesterol, hypertension, liver fatty degeneration, R eye macular degeneration.   OT comments  Pt progressing towards goals. Completed functional mobility at RW level, toileting, and standing grooming ADLs with overall MinGuard assist, tub transfer to 3:1 with MinA and verbal cues for technique/sequencing. Pt requires verbal cues for deep breathing techniques throughout, education provided on energy conservation during ADL completion. Will continue to follow acutely to progress Pt's safety and independence with ADLs and functional mobility.    Follow Up Recommendations  No OT follow up;Supervision/Assistance - 24 hour    Equipment Recommendations  None recommended by OT (needs are met )          Precautions / Restrictions Precautions Precautions: Fall Precaution Comments: sob Restrictions Weight Bearing Restrictions: Yes LLE Weight Bearing: Weight bearing as tolerated       Mobility Bed Mobility Overal bed mobility: Needs Assistance Bed Mobility: Sit to Supine       Sit to supine: Min assist;HOB elevated   General bed mobility comments: MinA for LLE management   Transfers Overall transfer level: Needs assistance Equipment used: Rolling walker (2 wheeled) Transfers: Sit to/from Stand Sit to Stand: Min guard;Min assist         General transfer comment: v/c's for safe hand placement, increased time, labored effort; intermittently requires MinA to rise     Balance Overall balance assessment: Needs assistance Sitting-balance support: No upper extremity supported;Feet supported Sitting  balance-Leahy Scale: Fair     Standing balance support: Bilateral upper extremity supported;During functional activity Standing balance-Leahy Scale: Fair Standing balance comment: Pt maintains static standing at sink to wash hands with close guard for safety; reliant on UE support during mobility                            ADL either performed or assessed with clinical judgement   ADL Overall ADL's : Needs assistance/impaired     Grooming: Min guard;Wash/dry hands;Standing                   Armed forces technical officer: Min guard;Ambulation;Regular Toilet;Grab bars;RW   Toileting- Water quality scientist and Hygiene: Min guard;Sit to/from stand Toileting - Clothing Manipulation Details (indicate cue type and reason): Pt completing clothing management with close guard for safety  Tub/ Shower Transfer: Tub transfer;Minimal assistance;Ambulation;Cueing for sequencing;3 in 1;Rolling walker Tub/Shower Transfer Details (indicate cue type and reason): Pt demonstrates tub transfer with Min steady assist and cues for technique/sequencing; Pt with increased pain during transfer completion, educated on option of completing sponge bath initially after return home until Pt is able to safely perform transfer without increased pain  Functional mobility during ADLs: Min guard;Rolling walker General ADL Comments: Pt demonstrates SOB during mobility, requires verbal cues for deep breathing techniques, Pt on 2L O2 during session with O2 monitored and remaining around 93% on 2L throughout; Pt requires frequent rest breaks; educated on energy conservation techniques during ADL completion                        Cognition Arousal/Alertness: Awake/alert Behavior During Therapy: WFL for tasks assessed/performed Overall  Cognitive Status: Within Functional Limits for tasks assessed                                                           Pertinent Vitals/ Pain       Pain  Assessment: Faces Pain Score: 7  Faces Pain Scale: Hurts even more Pain Location: L hip Pain Descriptors / Indicators: Aching;Sore Pain Intervention(s): Limited activity within patient's tolerance;Monitored during session;Ice applied;Repositioned                                                          Frequency  Min 2X/week        Progress Toward Goals  OT Goals(current goals can now be found in the care plan section)  Progress towards OT goals: Progressing toward goals  Acute Rehab OT Goals Patient Stated Goal: less pain OT Goal Formulation: With patient Time For Goal Achievement: 08/13/17 Potential to Achieve Goals: Good  Plan Discharge plan remains appropriate                     AM-PAC PT "6 Clicks" Daily Activity     Outcome Measure   Help from another person eating meals?: None Help from another person taking care of personal grooming?: None Help from another person toileting, which includes using toliet, bedpan, or urinal?: A Little Help from another person bathing (including washing, rinsing, drying)?: A Little Help from another person to put on and taking off regular upper body clothing?: None Help from another person to put on and taking off regular lower body clothing?: A Little 6 Click Score: 21    End of Session Equipment Utilized During Treatment: Gait belt;Rolling walker;Oxygen (2L O2)  OT Visit Diagnosis: Unsteadiness on feet (R26.81);Pain Pain - Right/Left: Left Pain - part of body: Hip   Activity Tolerance Patient tolerated treatment well   Patient Left with call bell/phone within reach;in bed;with bed alarm set   Nurse Communication Mobility status        Time: 2194-7125 OT Time Calculation (min): 37 min  Charges: OT General Charges $OT Visit: 1 Visit OT Treatments $Self Care/Home Management : 23-37 mins  George Leon, OT Pager [phone] 08/07/2017    George Leon 08/07/2017, 1:39  PM

## 2017-08-07 NOTE — Progress Notes (Signed)
Subjective: 2 Days Post-Op Procedure(s) (LRB): LEFT TOTAL HIP ARTHROPLASTY ANTERIOR APPROACH (Left) Patient reports pain as moderate.  Shortness of breath today. No chest pain. Having some cramping. Wants potasium tablets.   Objective: Vital signs in last 24 hours: Temp:  [98.2 F (36.8 C)-99 F (37.2 C)] 98.2 F (36.8 C) (08/30 0602) Pulse Rate:  [97-111] 97 (08/30 0602) Resp:  [17-20] 17 (08/30 0602) BP: (124-130)/(63-78) 130/74 (08/30 0602) SpO2:  [94 %-97 %] 94 % (08/30 0602)  Intake/Output from previous day: 08/29 0701 - 08/30 0700 In: 720 [P.O.:720] Out: -  Intake/Output this shift: Total I/O In: 240 [P.O.:240] Out: -    Recent Labs  08/06/17 0650  HGB 11.5*    Recent Labs  08/06/17 0650  WBC 15.2*  RBC 4.09*  HCT 37.0*  PLT 246    Recent Labs  08/06/17 0650  NA 135  K 3.9  CL 101  CO2 25  BUN 10  CREATININE 0.79  GLUCOSE 109*  CALCIUM 8.2*   No results for input(s): LABPT, INR in the last 72 hours.  Incision: dressing C/D/I Compartment soft  Assessment/Plan: 2 Days Post-Op Procedure(s) (LRB): LEFT TOTAL HIP ARTHROPLASTY ANTERIOR APPROACH (Left) Up with therapy  Potasium 3.9 mmol/L yesterday . Has Robaxin for muscle spasm.  Most likely discharge to home tomorrow.  Charlei Ramsaran 08/07/2017, 10:20 AM

## 2017-08-07 NOTE — Progress Notes (Signed)
08/07/17 1400  PT Visit Information  Last PT Received On 08/07/17  Assistance Needed +1  History of Present Illness Pt is a 61 y.o. male s/p L total hip arthroplasty anterior approach secondary to L hip pain. PMH significant for: acute MI, acute on chronic diastolic heart failure, arthritis, CHF, chronic pain, COPD, dyspnea with exertion, high cholesterol, hypertension, liver fatty degeneration, R eye macular degeneration.  Subjective Data  Patient Stated Goal less pain  Precautions  Precautions Fall  Precaution Comments sob, Watch O2 sats  Restrictions  Weight Bearing Restrictions Yes  LLE Weight Bearing WBAT  Pain Assessment  Pain Assessment Faces  Faces Pain Scale 6  Pain Location L hip  Pain Descriptors / Indicators Aching;Sore  Pain Intervention(s) Monitored during session;Limited activity within patient's tolerance;Repositioned;Patient requesting pain meds-RN notified  Cognition  Arousal/Alertness Awake/alert  Behavior During Therapy WFL for tasks assessed/performed  Overall Cognitive Status Within Functional Limits for tasks assessed  Bed Mobility  Overal bed mobility Needs Assistance  Bed Mobility Supine to Sit  Supine to sit Min guard;HOB elevated  General bed mobility comments No physical assist required for bed mobilities, however pt heavily reliant on bed rails. Reports he intends to sleep in recliner on return home.  Transfers  Overall transfer level Needs assistance  Equipment used Rolling walker (2 wheeled)  Transfers Sit to/from Stand  Sit to Stand Min guard  General transfer comment Cues for safe hand placement, increased time required. Labored effort.  Ambulation/Gait  Ambulation/Gait assistance Min guard  Ambulation Distance (Feet) 100 Feet  Assistive device Rolling walker (2 wheeled)  Gait Pattern/deviations Step-to pattern;Decreased stride length;Step-through pattern;Antalgic;Trunk flexed  General Gait Details Slightly antalgic gait. Pt avoiding WB on LLE  initially; however increased WB tolerance with distance. Pt SOB on 2L O2. Cues for walker proximity and postural control.  Gait velocity slow  Gait velocity interpretation Below normal speed for age/gender  Balance  Overall balance assessment Needs assistance  Sitting-balance support No upper extremity supported;Feet supported  Sitting balance-Leahy Scale Fair  Standing balance support Bilateral upper extremity supported;During functional activity  Standing balance-Leahy Scale Fair  Standing balance comment Pt maintains static standing at sink to wash hands with close guard for safety; reliant on UE support during mobility   Exercises  Exercises General Lower Extremity  Total Joint Exercises  Knee Flexion AROM;Left;10 reps;Seated  Standing Hip Extension AROM;Left;10 reps;Standing  General Exercises - Lower Extremity  Ankle Circles/Pumps AROM;Both;10 reps;Seated  Quad Sets AROM;Left;5 reps;Supine (LE elevated)  Short Arc Quad AROM;Left;5 reps;Supine  Heel Slides AROM;Left;5 reps;Supine  Hip ABduction/ADduction AROM;Left;5 reps;Supine;Standing (done in both supine and standing)  PT - End of Session  Equipment Utilized During Treatment Gait belt;Oxygen  Activity Tolerance Patient limited by pain  Patient left with call bell/phone within reach;in chair  Nurse Communication Patient requests pain meds  PT - Assessment/Plan  PT Plan Current plan remains appropriate  PT Visit Diagnosis Difficulty in walking, not elsewhere classified (R26.2)  PT Frequency (ACUTE ONLY) 7X/week  Follow Up Recommendations Home health PT;Supervision/Assistance - 24 hour  PT equipment Rolling walker with 5" wheels  AM-PAC PT "6 Clicks" Daily Activity Outcome Measure  Difficulty turning over in bed (including adjusting bedclothes, sheets and blankets)? 1  Difficulty moving from lying on back to sitting on the side of the bed?  1  Difficulty sitting down on and standing up from a chair with arms (e.g.,  wheelchair, bedside commode, etc,.)? 1  Help needed moving to and from a bed to  chair (including a wheelchair)? 3  Help needed walking in hospital room? 3  Help needed climbing 3-5 steps with a railing?  2  6 Click Score 11  Mobility G Code  CL  PT Goal Progression  Progress towards PT goals Progressing toward goals  Acute Rehab PT Goals  PT Goal Formulation With patient  Time For Goal Achievement 08/13/17  Potential to Achieve Goals Good  PT Time Calculation  PT Start Time (ACUTE ONLY) 1358  PT Stop Time (ACUTE ONLY) 1428  PT Time Calculation (min) (ACUTE ONLY) 30 min  PT General Charges  $$ ACUTE PT VISIT 1 Visit  PT Treatments  $Gait Training 8-22 mins  $Therapeutic Exercise 8-22 mins   Today's skilled session focused on providing pt with HEP and gait training. Pt with increased pain and stiffness this session, requiring 1 seated rest break. Pt continues to be SOB with all mobilities and frequently requires cues to breath. Patient would benefit from continued skilled PT to increase functional independence. Will continue to follow acutely.   Benjiman Core, PTA Pager [phone] Acute Rehab

## 2017-08-08 ENCOUNTER — Telehealth (INDEPENDENT_AMBULATORY_CARE_PROVIDER_SITE_OTHER): Payer: Self-pay | Admitting: Radiology

## 2017-08-08 MED ORDER — NYSTATIN 100000 UNIT/GM EX CREA: TOPICAL_CREAM | Freq: Two times a day (BID) | CUTANEOUS | 0 refills | Status: DC

## 2017-08-08 MED ORDER — NYSTATIN 100000 UNIT/GM EX CREA
TOPICAL_CREAM | Freq: Two times a day (BID) | CUTANEOUS | Status: DC
  Administered 2017-08-08: 07:00:00 via TOPICAL
  Filled 2017-08-08: qty 15

## 2017-08-08 MED ORDER — TIZANIDINE HCL 4 MG PO TABS: 4.0000 mg | ORAL_TABLET | Freq: Two times a day (BID) | ORAL | 0 refills | Status: DC | PRN

## 2017-08-08 NOTE — Telephone Encounter (Signed)
[facility] called and LMVM yesterday, needs someone to call them about a Rx patient was given in hospital.

## 2017-08-08 NOTE — Progress Notes (Signed)
Patient ID: George Leon, male   DOB: 01/03/1956, 61 y.o.   MRN: [id number] Doing well.  Can be discharged to home today.

## 2017-08-08 NOTE — Progress Notes (Signed)
Physical Therapy Treatment Patient Details Name: George Leon MRN: [id number] DOB: Apr 23, 1956 Today's Date: 08/08/2017    History of Present Illness Pt is a 61 y.o. male s/p L total hip arthroplasty anterior approach secondary to L hip pain. PMH significant for: acute MI, acute on chronic diastolic heart failure, arthritis, CHF, chronic pain, COPD, dyspnea with exertion, high cholesterol, hypertension, liver fatty degeneration, R eye macular degeneration.    PT Comments    Pt improved ambulation distance today with less SOB noted. Pt ambulated 2110ft on RA and SpO2 remained >98% through out session. Pt expected to d/c home today with HHPT.   Follow Up Recommendations  Home health PT;Supervision/Assistance - 24 hour     Equipment Recommendations  Rolling walker with 5" wheels    Recommendations for Other Services       Precautions / Restrictions Precautions Precautions: Fall Precaution Comments: sob, Watch O2 sats Restrictions Weight Bearing Restrictions: Yes LLE Weight Bearing: Weight bearing as tolerated    Mobility  Bed Mobility Overal bed mobility: Needs Assistance Bed Mobility: Sit to Supine       Sit to supine: Min guard;HOB elevated   General bed mobility comments: No physical assist required, however pt required increased time and cueing for technique to ues R LE to assist with elevating LLE into bed.  Transfers Overall transfer level: Needs assistance Equipment used: Rolling walker (2 wheeled) Transfers: Sit to/from Stand Sit to Stand: Min guard         General transfer comment: Cues for safety as pt stood with out walker near and attempted to reach across the room for RW.  Ambulation/Gait Ambulation/Gait assistance: Min guard Ambulation Distance (Feet): 200 Feet Assistive device: Rolling walker (2 wheeled) Gait Pattern/deviations: Step-to pattern;Decreased stride length;Step-through pattern;Antalgic;Trunk flexed Gait velocity: slow Gait velocity  interpretation: Below normal speed for age/gender General Gait Details: Pt c/o of stiffness from sitting up in chair all night. Decreased knee flexion noted on LLE. Cues to increase knee flexion.    Stairs            Wheelchair Mobility    Modified Rankin (Stroke Patients Only)       Balance Overall balance assessment: Needs assistance Sitting-balance support: No upper extremity supported;Feet supported Sitting balance-Leahy Scale: Fair     Standing balance support: Bilateral upper extremity supported;During functional activity Standing balance-Leahy Scale: Fair Standing balance comment: Pt maintains static standing at sink to wash hands with close guard for safety; reliant on UE support during mobility                             Cognition Arousal/Alertness: Awake/alert Behavior During Therapy: WFL for tasks assessed/performed Overall Cognitive Status: Within Functional Limits for tasks assessed                                        Exercises      General Comments        Pertinent Vitals/Pain Pain Assessment: 0-10 Pain Score: 8  Pain Location: L hip & Knee Pain Descriptors / Indicators: Aching;Sore Pain Intervention(s): Monitored during session;Limited activity within patient's tolerance;Repositioned    Home Living                      Prior Function            PT Goals (  current goals can now be found in the care plan section) Acute Rehab PT Goals Patient Stated Goal: less pain PT Goal Formulation: With patient Time For Goal Achievement: 08/13/17 Potential to Achieve Goals: Good Progress towards PT goals: Progressing toward goals    Frequency    7X/week      PT Plan Current plan remains appropriate    Co-evaluation              AM-PAC PT "6 Clicks" Daily Activity  Outcome Measure  Difficulty turning over in bed (including adjusting bedclothes, sheets and blankets)?: Unable Difficulty moving  from lying on back to sitting on the side of the bed? : Unable Difficulty sitting down on and standing up from a chair with arms (e.g., wheelchair, bedside commode, etc,.)?: Unable Help needed moving to and from a bed to chair (including a wheelchair)?: A Little Help needed walking in hospital room?: A Little Help needed climbing 3-5 steps with a railing? : A Lot 6 Click Score: 11    End of Session Equipment Utilized During Treatment: Gait belt Activity Tolerance: Patient tolerated treatment well Patient left: with call bell/phone within reach;in bed Nurse Communication: Mobility status PT Visit Diagnosis: Difficulty in walking, not elsewhere classified (R26.2)     Time: 0092-3300 PT Time Calculation (min) (ACUTE ONLY): 24 min  Charges:  $Gait Training: 8-22 mins                    G Codes:       Benjiman Core, [place] Pager [phone] Acute Rehab   Allena Katz 08/08/2017, 10:13 AM

## 2017-08-08 NOTE — Telephone Encounter (Signed)
Patient's wife called from pharmacy stating Robaxin 500mg  is on back order and pharmacy does not have it. She would like for something else to be called in.  I spoke with Artis Delay via phone and was given verbal for Zanaflex 4mg  1 po bid prn spasms #40 with no refills.   I called script to [facility].  Patient's wife advised.

## 2017-08-08 NOTE — Progress Notes (Signed)
Physical Therapy Treatment Patient Details Name: George Leon MRN: [id number] DOB: 1956/03/01 Today's Date: 08/08/2017    History of Present Illness Pt is a 61 y.o. male s/p L total hip arthroplasty anterior approach secondary to L hip pain. PMH significant for: acute MI, acute on chronic diastolic heart failure, arthritis, CHF, chronic pain, COPD, dyspnea with exertion, high cholesterol, hypertension, liver fatty degeneration, R eye macular degeneration.    PT Comments    Pt limited this session secondary to pain. Agreeable to perform HEP and assisted to/from the bathroom. Pt expected to d/c today with HHPT.    Follow Up Recommendations  Home health PT;Supervision/Assistance - 24 hour     Equipment Recommendations  Rolling walker with 5" wheels    Recommendations for Other Services       Precautions / Restrictions Precautions Precautions: Fall Precaution Comments: sob, Watch O2 sats Restrictions Weight Bearing Restrictions: Yes LLE Weight Bearing: Weight bearing as tolerated    Mobility  Bed Mobility Overal bed mobility: Needs Assistance Bed Mobility: Supine to Sit     Supine to sit: Min guard;HOB elevated Sit to supine: Min guard;HOB elevated   General bed mobility comments: No physical assist, pt required increased time and heavy use of bed rails. Cueing to breathe  Transfers Overall transfer level: Needs assistance Equipment used: Rolling walker (2 wheeled) Transfers: Sit to/from Stand Sit to Stand: Min guard         General transfer comment: Cues for safe hand placement. Pt attempting to pull up on walker.  Ambulation/Gait Ambulation/Gait assistance: Min guard Ambulation Distance (Feet): 20 Feet Assistive device: Rolling walker (2 wheeled) Gait Pattern/deviations: Step-to pattern;Decreased stride length;Step-through pattern;Antalgic;Trunk flexed Gait velocity: slow Gait velocity interpretation: Below normal speed for age/gender General Gait Details:  limited ambulation distance today secondary to increased pain this session. Assisted pt to and from bathroom.   Stairs            Wheelchair Mobility    Modified Rankin (Stroke Patients Only)       Balance Overall balance assessment: Needs assistance Sitting-balance support: No upper extremity supported;Feet supported Sitting balance-Leahy Scale: Fair     Standing balance support: Bilateral upper extremity supported;During functional activity Standing balance-Leahy Scale: Fair Standing balance comment: Pt maintains static standing at sink to wash hands with close guard for safety; reliant on UE support during mobility                             Cognition Arousal/Alertness: Awake/alert Behavior During Therapy: WFL for tasks assessed/performed Overall Cognitive Status: Within Functional Limits for tasks assessed                                        Exercises General Exercises - Lower Extremity Ankle Circles/Pumps: AROM;Both;10 reps;Seated Quad Sets: AROM;Left;5 reps;Supine Short Arc Quad: AROM;Left;10 reps;Supine Long Arc Quad: AROM;Left;5 reps;Seated Heel Slides: AROM;Left;5 reps;Supine Hip ABduction/ADduction: AROM;Left;5 reps;Supine    General Comments        Pertinent Vitals/Pain Pain Assessment: 0-10 Pain Score: 10-Worst pain ever Pain Location: L hip & Knee Pain Descriptors / Indicators: Aching;Sore Pain Intervention(s): Monitored during session;Limited activity within patient's tolerance;Repositioned;Patient requesting pain meds-RN notified;Ice applied    Home Living                      Prior Function  PT Goals (current goals can now be found in the care plan section) Acute Rehab PT Goals Patient Stated Goal: less pain PT Goal Formulation: With patient Time For Goal Achievement: 08/13/17 Potential to Achieve Goals: Good Progress towards PT goals: Progressing toward goals    Frequency     7X/week      PT Plan Current plan remains appropriate    Co-evaluation              AM-PAC PT "6 Clicks" Daily Activity  Outcome Measure  Difficulty turning over in bed (including adjusting bedclothes, sheets and blankets)?: Unable Difficulty moving from lying on back to sitting on the side of the bed? : Unable Difficulty sitting down on and standing up from a chair with arms (e.g., wheelchair, bedside commode, etc,.)?: Unable Help needed moving to and from a bed to chair (including a wheelchair)?: A Little Help needed walking in hospital room?: A Little Help needed climbing 3-5 steps with a railing? : A Lot 6 Click Score: 11    End of Session Equipment Utilized During Treatment: Gait belt Activity Tolerance: Patient limited by pain Patient left: with call bell/phone within reach;in chair Nurse Communication: Patient requests pain meds PT Visit Diagnosis: Difficulty in walking, not elsewhere classified (R26.2)     Time: 5188-4166 PT Time Calculation (min) (ACUTE ONLY): 22 min  Charges:  $Gait Training: 8-22 mins $Therapeutic Exercise: 8-22 mins                    G Codes:       Benjiman Core, [place] Pager [phone] Acute Rehab   Allena Katz 08/08/2017, 1:50 PM

## 2017-08-08 NOTE — Discharge Summary (Signed)
Patient ID: TANIS BURNLEY MRN: [id number] DOB/AGE: 04-11-1956 61 y.o.  Admit date: 08/05/2017 Discharge date: 08/08/2017  Admission Diagnoses:  Principal Problem:   Unilateral primary osteoarthritis, left hip Active Problems:   Status post total replacement of left hip   Discharge Diagnoses:  Same  Past Medical History:  Diagnosis Date  . Acute MI ([place])   . Acute on chronic diastolic heart failure ([place]) 12/01/2015  . Anxiety   . Arthritis    "qwhere" (08/06/2017)  . CHF (congestive heart failure) ([place]) dx'd 2016  . Chronic back pain    "all my back" (08/06/2017)  . Chronic bronchitis ([place])   . Chronic pain   . Chronic pain syndrome 12/01/2015  . COPD (chronic obstructive pulmonary disease) ([place])   . Dyspnea    w/ exertion   . Emphysema of lung ([place])   . High cholesterol   . History of hiatal hernia    possible    . Hypertension   . Liver fatty degeneration   . Macular degeneration, right eye   . Pneumonia  1990s X 1; 2016    Surgeries: Procedure(s): LEFT TOTAL HIP ARTHROPLASTY ANTERIOR APPROACH on 08/05/2017   Consultants:   Discharged Condition: Improved  Hospital Course: DARIEN KADING is an 61 y.o. male who was admitted 08/05/2017 for operative treatment ofUnilateral primary osteoarthritis, left hip. Patient has severe unremitting pain that affects sleep, daily activities, and work/hobbies. After pre-op clearance the patient was taken to the operating room on 08/05/2017 and underwent  Procedure(s): LEFT TOTAL HIP ARTHROPLASTY ANTERIOR APPROACH.    Patient was given perioperative antibiotics: Anti-infectives    Start     Dose/Rate Route Frequency Ordered Stop   08/05/17 1800  ceFAZolin (ANCEF) IVPB 1 g/50 mL premix     1 g 100 mL/hr over 30 Minutes Intravenous Every 6 hours 08/05/17 1715 08/06/17 0030   08/05/17 1130  ceFAZolin (ANCEF) 3 g in dextrose 5 % 50 mL IVPB     3 g 130 mL/hr over 30 Minutes Intravenous To [facility]Stay [facility] 08/04/17 1352 08/05/17  1213       Patient was given sequential compression devices, early ambulation, and chemoprophylaxis to prevent DVT.  Patient benefited maximally from hospital stay and there were no complications.    Recent vital signs: Patient Vitals for the past 24 hrs:  BP Temp Temp src Pulse Resp SpO2  08/08/17 0445 120/68 98.1 F (36.7 C) Oral 90 17 96 %  08/07/17 2046 131/67 98.7 F (37.1 C) Oral 100 19 97 %  08/07/17 1503 121/62 98 F (36.7 C) Oral 99 16 95 %     Recent laboratory studies:  Recent Labs  08/06/17 0650  WBC 15.2*  HGB 11.5*  HCT 37.0*  PLT 246  NA 135  K 3.9  CL 101  CO2 25  BUN 10  CREATININE 0.79  GLUCOSE 109*  CALCIUM 8.2*     Discharge Medications:   Allergies as of 08/08/2017      Reactions   Sulfamethoxazole Swelling   Tongue Swelling   Adhesive [tape] Rash   Paper tape is what bothers him Pulled skin off also   Hydrochlorothiazide Other (See Comments)   Back Pain & gives him kidney stones      Medication List    STOP taking these medications   aspirin EC 81 MG tablet Replaced by:  aspirin 81 MG chewable tablet   ibuprofen 200 MG tablet Commonly known as:  ADVIL,MOTRIN   oxyCODONE-acetaminophen 5-325 MG tablet  Commonly known as:  ROXICET     TAKE these medications   albuterol 108 (90 Base) MCG/ACT inhaler Commonly known as:  PROAIR HFA Inhale 1-2 puffs into the lungs every 4 (four) hours as needed. As needed for shortness of breath /wheeze/cough   amLODipine 10 MG tablet Commonly known as:  NORVASC Take 0.5 tablets (5 mg total) by mouth daily.   aspirin 81 MG chewable tablet Chew 1 tablet (81 mg total) by mouth 2 (two) times daily. Replaces:  aspirin EC 81 MG tablet   atorvastatin 80 MG tablet Commonly known as:  LIPITOR Take 1 tablet (80 mg total) by mouth daily. What changed:  when to take this   DULoxetine 60 MG capsule Commonly known as:  CYMBALTA Take 1 capsule (60 mg total) by mouth daily.   esomeprazole 40 MG  capsule Commonly known as:  NEXIUM TAKE ONE CAPSULE BY MOUTH ONCE DAILY AS NEEDED FOR  INDIGESTION What changed:  how much to take  how to take this  when to take this  reasons to take this  additional instructions   fish oil-omega-3 fatty acids 1000 MG capsule Take 1 g by mouth daily.   Fluticasone-Umeclidin-Vilant 100-62.5-25 MCG/INH Aepb Commonly known as:  TRELEGY ELLIPTA Inhale 1 puff into the lungs daily.   HYDROcodone-acetaminophen 5-325 MG tablet Commonly known as:  NORCO/VICODIN Take 1-2 tablets by mouth every 4 (four) hours as needed. What changed:  how much to take  when to take this  reasons to take this   losartan 100 MG tablet Commonly known as:  COZAAR Take 1 tablet (100 mg total) by mouth daily.   Medical Compression Stockings Misc 1 each by Does not apply route once.   methocarbamol 500 MG tablet Commonly known as:  ROBAXIN Take 1 tablet (500 mg total) by mouth every 6 (six) hours as needed for muscle spasms.   nicotine 21 mg/24hr patch Commonly known as:  NICODERM CQ - dosed in mg/24 hours Place 1 patch (21 mg total) onto the skin daily.   nystatin ointment Commonly known as:  MYCOSTATIN Apply 1 application topically 2 (two) times daily. What changed:  when to take this  reasons to take this   OCUVITE ADULT FORMULA Caps Take 1 capsule by mouth daily.   sodium chloride 0.65 % Soln nasal spray Commonly known as:  OCEAN Place 1 spray into both nostrils as needed for congestion.            Durable Medical Equipment        Start     Ordered   08/05/17 1716  DME 3 n 1  Once     08/05/17 1715   08/05/17 1716  DME Walker rolling  Once    Question:  Patient needs a walker to treat with the following condition  Answer:  Status post total replacement of left hip   08/05/17 1715       Discharge Care Instructions        Start     Ordered   08/08/17 0000  Discharge patient    Comments:  Can send home this afternoon.   Question Answer Comment  Discharge disposition 01-Home or Self Care   Discharge patient date 08/08/2017      08/08/17 0644   08/07/17 0000  aspirin 81 MG chewable tablet  2 times daily     08/07/17 1011   08/07/17 0000  methocarbamol (ROBAXIN) 500 MG tablet  Every 6 hours PRN  08/07/17 1011   08/07/17 0000  HYDROcodone-acetaminophen (NORCO/VICODIN) 5-325 MG tablet  Every 4 hours PRN     08/07/17 1018   08/07/17 0000  nicotine (NICODERM CQ - DOSED IN MG/24 HOURS) 21 mg/24hr patch  Every 24 hours     08/07/17 1018      Diagnostic Studies: Dg Pelvis Portable  Result Date: 08/05/2017 CLINICAL DATA:  Postop left hip. EXAM: PORTABLE PELVIS 1-2 VIEWS COMPARISON:  Fluoroscopy from earlier today FINDINGS: Total left hip arthroplasty that is located. No periprosthetic fracture. Negative visualized right hip and lower pelvis. IMPRESSION: No unexpected finding after left hip arthroplasty. Electronically Signed   By: Monte Fantasia M.D.   On: 08/05/2017 15:10   Dg C-arm 1-60 Min  Result Date: 08/05/2017 CLINICAL DATA:  Left hip arthroplasty EXAM: OPERATIVE LEFT HIP (WITH PELVIS IF PERFORMED) 1 VIEWS TECHNIQUE: Fluoroscopic spot image(s) were submitted for interpretation post-operatively. COMPARISON:  07/02/2017 FINDINGS: AP fluoroscopy shows severe left hip osteoarthritis with superolateral bone-on-bone contact. Subsequently there is an in place total hip arthroplasty without dislocation or visible periprosthetic fracture. IMPRESSION: Fluoroscopy for left hip arthroplasty.  No unexpected finding. Electronically Signed   By: Monte Fantasia M.D.   On: 08/05/2017 14:04   Dg Hip Operative Unilat With Pelvis Left  Result Date: 08/05/2017 CLINICAL DATA:  Left hip arthroplasty EXAM: OPERATIVE LEFT HIP (WITH PELVIS IF PERFORMED) 1 VIEWS TECHNIQUE: Fluoroscopic spot image(s) were submitted for interpretation post-operatively. COMPARISON:  07/02/2017 FINDINGS: AP fluoroscopy shows severe left hip  osteoarthritis with superolateral bone-on-bone contact. Subsequently there is an in place total hip arthroplasty without dislocation or visible periprosthetic fracture. IMPRESSION: Fluoroscopy for left hip arthroplasty.  No unexpected finding. Electronically Signed   By: Monte Fantasia M.D.   On: 08/05/2017 14:04    Disposition: 01-Home or Self Care  Discharge Instructions    Discharge patient    Complete by:  As directed    Can send home this afternoon.   Discharge disposition:  01-Home or Self Care   Discharge patient date:  08/08/2017      Follow-up Information    Mcarthur Rossetti, MD. Schedule an appointment as soon as possible for a visit in 2 week(s).   Specialty:  Orthopedic Surgery Contact information: [place] [address] [phone]        [facility] Follow up.   Specialty:  [place] Why:  A representative from [facility] will contact you to arrange start date and time for your therapy. Contact information: [address] [place] [address] [phone]            Signed: Mcarthur Rossetti 08/08/2017, 6:44 AM

## 2017-08-08 NOTE — Telephone Encounter (Signed)
Another patient was given a Rx yesterday with Jaeven's name on it just [facility]

## 2017-08-12 ENCOUNTER — Telehealth (INDEPENDENT_AMBULATORY_CARE_PROVIDER_SITE_OTHER): Payer: Self-pay

## 2017-08-12 NOTE — Telephone Encounter (Signed)
Verbal order given  

## 2017-08-12 NOTE — Telephone Encounter (Signed)
George Leon from [facility] called requesting verbal order for 3xwk for 2wks Please call with verbal [phone]

## 2017-08-14 ENCOUNTER — Telehealth: Payer: Self-pay | Admitting: Student

## 2017-08-14 DIAGNOSIS — I10 Essential (primary) hypertension: Secondary | ICD-10-CM

## 2017-08-14 MED ORDER — LOSARTAN POTASSIUM 100 MG PO TABS: 100.0000 mg | ORAL_TABLET | Freq: Every day | ORAL | 3 refills | Status: DC

## 2017-08-14 NOTE — Telephone Encounter (Signed)
Pt needs a refill on his Losartan to be called in and would to have a 90 day Qty so that he gets a better price from his insurance. jw

## 2017-08-15 ENCOUNTER — Telehealth: Payer: Self-pay | Admitting: *Deleted

## 2017-08-15 NOTE — Telephone Encounter (Signed)
[facility] left message on nurse line stating the new new rx for aspirin 81mg  tablet with the twice daily dosing.

## 2017-08-18 NOTE — Telephone Encounter (Signed)
We will only need aspirin 81 mg twice a day for the first 2 weeks post surgery then 81 mg daily for a week and then go back on whenever aspirate the knee was on prior to surgery.

## 2017-08-19 ENCOUNTER — Telehealth (INDEPENDENT_AMBULATORY_CARE_PROVIDER_SITE_OTHER): Payer: Self-pay

## 2017-08-19 ENCOUNTER — Other Ambulatory Visit (INDEPENDENT_AMBULATORY_CARE_PROVIDER_SITE_OTHER): Payer: Self-pay

## 2017-08-19 ENCOUNTER — Ambulatory Visit (INDEPENDENT_AMBULATORY_CARE_PROVIDER_SITE_OTHER): Payer: Managed Care, Other (non HMO) | Admitting: Physician Assistant

## 2017-08-19 DIAGNOSIS — Z96642 Presence of left artificial hip joint: Secondary | ICD-10-CM

## 2017-08-19 MED ORDER — METHOCARBAMOL 500 MG PO TABS: 500.0000 mg | ORAL_TABLET | Freq: Four times a day (QID) | ORAL | 0 refills | Status: DC

## 2017-08-19 MED ORDER — METHOCARBAMOL 750 MG PO TABS: 750.0000 mg | ORAL_TABLET | Freq: Three times a day (TID) | ORAL | 1 refills | Status: DC | PRN

## 2017-08-19 MED ORDER — HYDROCODONE-ACETAMINOPHEN 5-325 MG PO TABS: 1.0000 | ORAL_TABLET | ORAL | 0 refills | Status: DC | PRN

## 2017-08-19 NOTE — Progress Notes (Signed)
Mr.Lefever returns today 2 weeks status post left total hip arthroplasty. He is ambulating with a walker and states is really sore. He is had no change in his breathing status is COPD. Denies any fevers chills chest pain. He is on aspirin 81 mg twice daily.   Left hip: Staples well approximated the wound. No signs of infection. Slight abrasion over the proximal end of the incision. No seroma. Left calf supple nontender. Dorsiflexion reflection the left ankles intact. Sensation grossly intact.  Impression : 2 weeks status post left total hip arthroplasty  Plan: Staples removed Steri-Strips applied. Keep the proximal incision dry. Wash the incision daily after showering wash the incision thoroughly with antibacterial soap. Follow-up with [place] in 2 weeks for wound check sooner if there is any questions or concerns. He go back just to his 81 mg a day aspirin. Refill on his hydrocodone and Robaxin were given. Questions encouraged and answered.

## 2017-08-19 NOTE — Progress Notes (Unsigned)
baxin

## 2017-08-19 NOTE — Telephone Encounter (Signed)
See below Do you want to continue his aspirin or give it back to his PCP?

## 2017-08-19 NOTE — Telephone Encounter (Signed)
Try robaxin 750 mg instead

## 2017-08-19 NOTE — Telephone Encounter (Signed)
I spoke with Threasa Beards at [facility]. I advised that we only had 2 weeks post op of patient using Aspirin 81mg  twice a day.  He is now back to one 81 mg aspirin a day.  Threasa Beards states that they need a new script [facility] to [facility] for 81 mg aspirin qd.  The patient's original script was called and canceled by [facility] on 9/4.    Rx can be e-prescribed to [facility] on [address] or faxed to [phone]

## 2017-08-19 NOTE — Telephone Encounter (Signed)
Please advise 

## 2017-08-19 NOTE — Telephone Encounter (Signed)
Gabriel with [facility] would like to know if another Rx could be called in for patient, due to Methocarbamol being on back order.  CB# is [phone].  Please advise.  Thank You.

## 2017-08-20 ENCOUNTER — Other Ambulatory Visit: Payer: Self-pay | Admitting: *Deleted

## 2017-08-20 DIAGNOSIS — I10 Essential (primary) hypertension: Secondary | ICD-10-CM

## 2017-08-20 MED ORDER — LOSARTAN POTASSIUM 100 MG PO TABS: 100.0000 mg | ORAL_TABLET | Freq: Every day | ORAL | 3 refills | Status: DC

## 2017-08-25 NOTE — Telephone Encounter (Signed)
We can do it this one time

## 2017-08-26 ENCOUNTER — Other Ambulatory Visit (INDEPENDENT_AMBULATORY_CARE_PROVIDER_SITE_OTHER): Payer: Self-pay

## 2017-08-26 MED ORDER — ASPIRIN 81 MG PO CHEW: 81.0000 mg | CHEWABLE_TABLET | Freq: Once | ORAL | 0 refills | Status: AC

## 2017-08-26 NOTE — Telephone Encounter (Signed)
[facility]

## 2017-09-02 ENCOUNTER — Ambulatory Visit (INDEPENDENT_AMBULATORY_CARE_PROVIDER_SITE_OTHER): Payer: Managed Care, Other (non HMO) | Admitting: Physician Assistant

## 2017-09-02 DIAGNOSIS — Z96642 Presence of left artificial hip joint: Secondary | ICD-10-CM

## 2017-09-02 MED ORDER — CYCLOBENZAPRINE HCL 10 MG PO TABS: 10.0000 mg | ORAL_TABLET | Freq: Three times a day (TID) | ORAL | 1 refills | Status: DC | PRN

## 2017-09-02 MED ORDER — HYDROCODONE-ACETAMINOPHEN 5-325 MG PO TABS: 1.0000 | ORAL_TABLET | ORAL | 0 refills | Status: DC | PRN

## 2017-09-02 NOTE — Progress Notes (Signed)
[name] returns today 28 days status post left total hip arthroplasty. He states that there is a staple at the proximal end of the incision and a stitch of the distal limb of the incision. Overall is doing well. No fevers or chills. Ambulating with a cane.  Physical exam: General well-developed well-nourished male in no acute distress. Left hip incision no staples throughout the incision present. There is a small Vicryl stitch that instead of the distal end of the incision. No signs of gross infection. Left calf supple nontender  Impression 28 days status post left total hip arthroplasty  Plan: Stitches removed from the distal incision today after prep with Betadine. He'll continue work on range of motion strengthening left hip. Scar tissue mobilization. Questions encouraged and answered. Follow-up in one month

## 2017-09-12 ENCOUNTER — Other Ambulatory Visit (INDEPENDENT_AMBULATORY_CARE_PROVIDER_SITE_OTHER): Payer: Self-pay | Admitting: Physician Assistant

## 2017-09-12 NOTE — Telephone Encounter (Signed)
Can you advise while Artis Delay is in surgery today? Thanks.

## 2017-09-29 ENCOUNTER — Other Ambulatory Visit: Payer: Self-pay | Admitting: *Deleted

## 2017-09-29 NOTE — Telephone Encounter (Signed)
Patient calls requesting a refill on pain meds.  States that Dr. Cyndia Skeeters told him that once he was finshed his postop with ortho that he would take back over Rx'ing meds. Haly Feher, Salome Spotted, CMA

## 2017-09-30 ENCOUNTER — Ambulatory Visit (INDEPENDENT_AMBULATORY_CARE_PROVIDER_SITE_OTHER): Payer: Managed Care, Other (non HMO) | Admitting: Orthopaedic Surgery

## 2017-09-30 DIAGNOSIS — Z96642 Presence of left artificial hip joint: Secondary | ICD-10-CM

## 2017-09-30 MED ORDER — HYDROCODONE-ACETAMINOPHEN 5-325 MG PO TABS: 1.0000 | ORAL_TABLET | Freq: Three times a day (TID) | ORAL | 0 refills | Status: DC | PRN

## 2017-09-30 NOTE — Telephone Encounter (Signed)
Patient got a refill on his Norco from orthopedic surgeon today. Bretta Bang

## 2017-09-30 NOTE — Progress Notes (Signed)
Patient is now almost 8 weeks status post a left total hip arthroplasty through direct anterior approach.  He is doing better overall.  He is significantly obese and is using a cane to get around on it he does need another refill of pain medication but he feels like he is making progress.  On exam his incisions well-healed.  I can easily put his of the range of motion even though he is showing some guarding with pain.  His leg lengths are near equal.  At this point to continue to increase his activities and I did refill his hydrocodone and counseled about narcotic use.  I would like to see him back in 4 weeks to see how he is doing overall and I would like a low AP pelvis at that visit.

## 2017-10-08 ENCOUNTER — Ambulatory Visit (INDEPENDENT_AMBULATORY_CARE_PROVIDER_SITE_OTHER): Payer: Managed Care, Other (non HMO) | Admitting: Student

## 2017-10-08 ENCOUNTER — Encounter: Payer: Self-pay | Admitting: Student

## 2017-10-08 VITALS — BP 118/70 | HR 113 | Temp 98.3°F | Ht 68.0 in | Wt 253.4 lb

## 2017-10-08 DIAGNOSIS — G8929 Other chronic pain: Secondary | ICD-10-CM | POA: Diagnosis not present

## 2017-10-08 DIAGNOSIS — D509 Iron deficiency anemia, unspecified: Secondary | ICD-10-CM

## 2017-10-08 DIAGNOSIS — Z87891 Personal history of nicotine dependence: Secondary | ICD-10-CM | POA: Diagnosis not present

## 2017-10-08 DIAGNOSIS — M545 Low back pain: Secondary | ICD-10-CM | POA: Diagnosis not present

## 2017-10-08 MED ORDER — HYDROCODONE-ACETAMINOPHEN 5-325 MG PO TABS: 1.0000 | ORAL_TABLET | Freq: Three times a day (TID) | ORAL | 0 refills | Status: DC | PRN

## 2017-10-08 MED ORDER — NICOTINE 21 MG/24HR TD PT24: MEDICATED_PATCH | TRANSDERMAL | 2 refills | Status: DC

## 2017-10-08 NOTE — Progress Notes (Signed)
Subjective:    George Leon is a 61 y.o. old male here for lower back pain.  HPI Lower back pain: this is a chronic issue. He describes it as dull and achy. No radiation to his legs or feet. He has been on Norco for long time. He says Norco makes pain bearable. He denies fall. Denies fever or chill. Denies urinary retention, fecal incontinence, saddle anesthesia. He just had sucessful left hip replacement less than a month ago. He is very happy with how things went. He waking more. He lost 4 lbs intentionally. He had DG Lumbar spine that showed significant arthritic changes about year ago. He asks if he can get a referral to Orthopedics, Dr. Rita Ohara.  He quit smoking since hip replacement.  PMH/Problem List: has HYPERCHOLESTEROLEMIA; OBESITY, MORBID; TOBACCO ABUSE; Preventative health care; HYPERTENSION, BENIGN; Chronic obstructive pulmonary disease ([place]); GERD; Osteoarthritis of multiple joints; GANGLION CYST, WRIST, LEFT; Disorder of SI (sacroiliac) joint; Peripheral vascular disease ([place]); Osteoarthritis of left knee; Osteoarthritis of right knee; Bilateral lower extremity edema; Diastolic CHF ([place]); Proteinuria; Macular degeneration, bilateral; Chronic venous insufficiency; Olecranon bursitis of right elbow; Tobacco abuse; Coronary artery disease involving native coronary artery of native heart without angina pectoris; Physical debility; Chronic pain syndrome; Cervical neck pain with evidence of disc disease; Left hip pain; Hyponatremia; Muscle cramps; Pulmonary hypertension ([place]); Hyperlipidemia; Chronic back pain; Other tear of medial meniscus, current injury, left knee, subsequent encounter; Chronic pain of left knee; Anemia; Unilateral primary osteoarthritis, left hip; Pain in left hip; and Status post total replacement of left hip on his problem list.   has a past medical history of Acute MI ([place]); Acute on chronic diastolic heart failure ([place]) (12/01/2015); Anxiety; Arthritis; CHF (congestive  heart failure) ([place]) (dx'd 2016); Chronic back pain; Chronic bronchitis ([place]); Chronic pain; Chronic pain syndrome (12/01/2015); COPD (chronic obstructive pulmonary disease) ([place]); Dyspnea; Emphysema of lung ([place]); High cholesterol; History of hiatal hernia; Hypertension; Liver fatty degeneration; Macular degeneration, right eye; and Pneumonia ( 1990s X 1; 2016).  FH:  No family history on file.  [place] Social History  Substance Use Topics  . Smoking status: Current Every Day Smoker    Packs/day: 1.00    Years: 45.00    Types: Cigarettes  . Smokeless tobacco: Former Systems developer    Types: Chew  . 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     Objective:     Vitals:   10/08/17 1556  BP: 118/70  Pulse: (!) 113  Temp: 98.3 F (36.8 C)  TempSrc: Oral  SpO2: 97%  Weight: 253 lb 6.4 oz (114.9 kg)  Height: 5\' 8"  (1.727 m)   Body mass index is 38.53 kg/m.  Physical Exam GEN: appears well, no apparent distress. CVS: tachycardic, RR, nl S1&S2, no murmurs, no edema RESP: no IWOB, good air movement bilaterally, CTAB GI: BS present & normal, morbidly obese MSK: Back: Normal skin, spine with normal alignment and no deformity.   No step offs, but tenderness to palpation over spines or paraspinous muscles. \   ROM limited partly due to body habitus. Ambulating in the room without cane Neuro exam in LE: motor 5/5 in all muscle groups, light sensation intact in L4-S1 dermatomes NEURO: alert and oiented appropriately, no gross deficits  PSYCH: euthymic mood with congruent affect    Assessment  and Plan:  1. Chronic bilateral low back pain without sciatica: chronic issue. No red flags except his age. Had imaging about a year ago that showed degenerative changes. He is agreeable to cutting down on his Norco monthly. Accordingly, 150 tablets  this month, 120 tablets next month and then 90 tablets. I printed and gave him those three prescriptions today. Encouraged him to keep the good work with walking and losing weight. I also ordered a referral to orthopedics per patient's request. Follow up in three months  2. History of tobacco use: quit before his left hip replacement. Congratulated him on this. Refilled his nicotine patch prescription today.   3. Iron deficiency anemia, unspecified iron deficiency anemia type - CBC with Differential/Platelet - Anemia panel - Again encouraged him to get his colonoscopy as soon as possible and gave him the phone numbers to call and schedule. He is considering this time.  Mercy Riding, MD 10/09/17 Pager: [phone]

## 2017-10-08 NOTE — Patient Instructions (Signed)
It was great seeing you today! We have addressed the following issues today  1. Back pain: We have refilled your medications.  I have also ordered a referral to orthopedic surgeon as yor requested 2. Screening for colon cancer: Please call 1 of the numbers below to schedule for your colonoscopy. 3. Smoking cessation: I have refilled his nicotine patch today.  Keep up the good work!!!  If we did any lab work today, and the results require attention, either me or my nurse will get in touch with you. If everything is normal, you will get a letter in mail and a message via . If you don't hear from [place] in two weeks, please give [place] a call. Otherwise, we look forward to seeing you again at your next visit. If you have any questions or concerns before then, please call the clinic at [phone].  Please bring all your medications to every doctors visit  Sign up for My Chart to have easy access to your labs results, and communication with your Primary care physician.    Please check-out at the front desk before leaving the clinic.    Take Care,   Dr. Cyndia Skeeters              Colon Cancer  People with early colon cancer usually have no warning signs or symptoms.  If found early, most patients can be cured, but if found when it has already spread, the chance of survival is not as good.  Colon cancer is the second most common cause of concern is in the [place] with over 56,000 deaths from colon cancer in 2005  Colon cancer is a common, treatable disease. Screening tests can find a cancer  early, before you have symptoms, and make it more likely that you will survive the disease.  Who needs to be tested? If you are age 6-75 yrs, you should be tested for colon cancer.  Ways to be tested:  A colonoscopy the best test to detect colon cancer. It requires you to drink a bowel preparation to clean out your colon before the test. During this test, a tube with a camera inserted into your rectum and examines  your entire colon. You can be given medicine to make you sleepy during the exam. Therefore, you will not be able to drive immediately after the test. There is a small risk of bowel injury during the test.   Stool cards that you can take home and take a sample of your stool is another option. The cards are not as good as colonoscopy at detecting cancer, but the tests are easier and cheaper.   To schedule the colonoscopy, you can call one of the 3 options below:  [facility]. Phone number: [phone]  [facility]. Phone number: [phone]  [facility]: Phone number [phone]

## 2017-10-09 LAB — CBC WITH DIFFERENTIAL/PLATELET
BASOS: 0 %
Basophils Absolute: 0.1 10*3/uL (ref 0.0–0.2)
EOS (ABSOLUTE): 0.2 10*3/uL (ref 0.0–0.4)
Eos: 2 %
HEMOGLOBIN: 12.9 g/dL — AB (ref 13.0–17.7)
IMMATURE GRANS (ABS): 0 10*3/uL (ref 0.0–0.1)
Immature Granulocytes: 0 %
LYMPHS: 28 %
Lymphocytes Absolute: 4.1 10*3/uL — ABNORMAL HIGH (ref 0.7–3.1)
MCH: 28.2 pg (ref 26.6–33.0)
MCHC: 33.7 g/dL (ref 31.5–35.7)
MCV: 84 fL (ref 79–97)
Monocytes Absolute: 1 10*3/uL — ABNORMAL HIGH (ref 0.1–0.9)
Monocytes: 7 %
NEUTROS ABS: 9.2 10*3/uL — AB (ref 1.4–7.0)
Neutrophils: 63 %
Platelets: 316 10*3/uL (ref 150–379)
RBC: 4.57 x10E6/uL (ref 4.14–5.80)
RDW: 14.6 % (ref 12.3–15.4)
WBC: 14.6 10*3/uL — ABNORMAL HIGH (ref 3.4–10.8)

## 2017-10-09 LAB — ANEMIA PANEL
FOLATE, HEMOLYSATE: 317.5 ng/mL
Ferritin: 108 ng/mL (ref 30–400)
Folate, RBC: 829 ng/mL (ref >498)
HEMATOCRIT: 38.3 % (ref 37.5–51.0)
IRON SATURATION: 18 % (ref 15–55)
IRON: 55 ug/dL (ref 38–169)
Retic Ct Pct: 2 % (ref 0.6–2.6)
Total Iron Binding Capacity: 313 ug/dL (ref 250–450)
UIBC: 258 ug/dL (ref 111–343)
Vitamin B-12: 236 pg/mL (ref 232–1245)

## 2017-10-21 ENCOUNTER — Telehealth: Payer: Self-pay | Admitting: Student

## 2017-10-21 DIAGNOSIS — M5136 Other intervertebral disc degeneration, lumbar region: Secondary | ICD-10-CM

## 2017-10-21 NOTE — Telephone Encounter (Signed)
MRI ordered to GI at [address] Patient notified. Thanks, Bretta Bang

## 2017-10-21 NOTE — Telephone Encounter (Signed)
Pt called because Dr. Donnella Bi office called and told him to have his PCP put in orders for him to have a Lumbar MRI before they schedule appointment to see him. They want those results first. Can we put the orders in and let patient know when to go. jw

## 2017-10-24 ENCOUNTER — Other Ambulatory Visit: Payer: Self-pay | Admitting: Student

## 2017-10-24 DIAGNOSIS — K219 Gastro-esophageal reflux disease without esophagitis: Secondary | ICD-10-CM

## 2017-10-24 NOTE — Progress Notes (Signed)
Opened by mistake.

## 2017-10-28 ENCOUNTER — Ambulatory Visit (INDEPENDENT_AMBULATORY_CARE_PROVIDER_SITE_OTHER): Payer: Managed Care, Other (non HMO) | Admitting: Orthopaedic Surgery

## 2017-10-28 ENCOUNTER — Ambulatory Visit (INDEPENDENT_AMBULATORY_CARE_PROVIDER_SITE_OTHER): Payer: Managed Care, Other (non HMO)

## 2017-10-28 ENCOUNTER — Encounter (INDEPENDENT_AMBULATORY_CARE_PROVIDER_SITE_OTHER): Payer: Self-pay | Admitting: Orthopaedic Surgery

## 2017-10-28 DIAGNOSIS — Z96642 Presence of left artificial hip joint: Secondary | ICD-10-CM | POA: Diagnosis not present

## 2017-10-28 NOTE — Progress Notes (Signed)
The patient is now almost 3 months status post a left total hip arthroplasty through direct anterior approach to treat severe osteophytes left hip.  He says the hip and knee are doing well at this point.  He does have occasional pinching feeling when he pivots but this rarely.  Examined with a cane and doing well overall.  On exam I can easily move his left hip to internal and external rotation without any difficulty at all and I can flex and extend his knee as well easily.  X-rays of his pelvis show well-seated implant on the left hip no comp gating features.  At this point I do not need to see him back for 8 months.  I would like just an AP and lateral of his left hip at that visit we do not need to see the pelvis.  All questions concerns were answered and addressed.

## 2017-11-04 ENCOUNTER — Encounter: Payer: Self-pay | Admitting: Student

## 2017-11-05 ENCOUNTER — Telehealth: Payer: Self-pay

## 2017-11-05 NOTE — Telephone Encounter (Signed)
Dorian Pod from [facility] called, reminding staff of PA needed for pts apt on Monday, 12/3.

## 2017-11-10 ENCOUNTER — Other Ambulatory Visit: Payer: Managed Care, Other (non HMO)

## 2017-11-10 ENCOUNTER — Other Ambulatory Visit: Payer: Self-pay | Admitting: Student

## 2017-11-10 DIAGNOSIS — I2510 Atherosclerotic heart disease of native coronary artery without angina pectoris: Secondary | ICD-10-CM

## 2017-11-10 MED ORDER — ASPIRIN EC 81 MG PO TBEC: 81.0000 mg | DELAYED_RELEASE_TABLET | Freq: Every day | ORAL | 3 refills | Status: DC

## 2017-11-10 NOTE — Progress Notes (Signed)
Refilled baby aspirin.  Discontinued Nexium.

## 2018-01-01 ENCOUNTER — Encounter: Payer: Self-pay | Admitting: Student

## 2018-01-01 ENCOUNTER — Ambulatory Visit (INDEPENDENT_AMBULATORY_CARE_PROVIDER_SITE_OTHER): Payer: Managed Care, Other (non HMO) | Admitting: Student

## 2018-01-01 DIAGNOSIS — G894 Chronic pain syndrome: Secondary | ICD-10-CM | POA: Diagnosis not present

## 2018-01-01 DIAGNOSIS — R52 Pain, unspecified: Secondary | ICD-10-CM

## 2018-01-01 DIAGNOSIS — L600 Ingrowing nail: Secondary | ICD-10-CM | POA: Diagnosis not present

## 2018-01-01 DIAGNOSIS — M545 Low back pain: Secondary | ICD-10-CM

## 2018-01-01 DIAGNOSIS — Z1211 Encounter for screening for malignant neoplasm of colon: Secondary | ICD-10-CM

## 2018-01-01 DIAGNOSIS — G8929 Other chronic pain: Secondary | ICD-10-CM | POA: Diagnosis not present

## 2018-01-01 MED ORDER — HYDROCODONE-ACETAMINOPHEN 5-325 MG PO TABS: 1.0000 | ORAL_TABLET | Freq: Three times a day (TID) | ORAL | 0 refills | Status: DC | PRN

## 2018-01-01 NOTE — Progress Notes (Signed)
Urine

## 2018-01-01 NOTE — Patient Instructions (Addendum)
It was great seeing you today! We have addressed the following issues today  Ingrown toenail: We have remove the toenail.  Please let us know if you have swelling, severe pain, fever, discharge or any other symptoms concerning to you.  Back pain: We will send the prescription for pain medication to your pharmacy.  If we did any lab work today, and the results require attention, either me or my nurse will get in touch with you. If everything is normal, you will get a letter in mail and a message via . If you don't hear from [place] in two weeks, please give [place] a call. Otherwise, we look forward to seeing you again at your next visit. If you have any questions or concerns before then, please call the clinic at [phone].  Please bring all your medications to every doctors visit  Sign up for My Chart to have easy access to your labs results, and communication with your Primary care physician.    Please check-out at the front desk before leaving the clinic.    Take Care,   Dr. Cyndia Skeeters

## 2018-01-01 NOTE — Progress Notes (Signed)
Subjective:    George Leon is a 62 y.o. old male here to discuss ingrown toe nail and for chronic back pain  HPI Ingrown toe nail: of left big toe. Had complete and partial nail removal in the past. He says it wasn't cauterized chemically at that time and it grew back. He now reports 7/10 pain in his left big toe medially. No fever, overlying skin change, swelling. Not on blood thinner. Likes to have whole toenail removed.   Back pain: this a chronic issue. He reports dropping few pills in toilet accidentally. He reports taking his last dose yesterday morning. Denies changes to the nature of his back pain. Denies numbness and tingling in his legs. Denies fever or chill. Denies urinary retention, fecal incontinence, saddle anesthesia. He just had sucessful left hip replacement less than a month ago  PMH/Problem List: has HYPERCHOLESTEROLEMIA; OBESITY, MORBID; TOBACCO ABUSE; Preventative health care; HYPERTENSION, BENIGN; Chronic obstructive pulmonary disease ([place]); GERD; Osteoarthritis of multiple joints; GANGLION CYST, WRIST, LEFT; Disorder of SI (sacroiliac) joint; Peripheral vascular disease ([place]); Ingrown left big toenail; Osteoarthritis of left knee; Osteoarthritis of right knee; Bilateral lower extremity edema; Diastolic CHF ([place]); Proteinuria; Macular degeneration, bilateral; Chronic venous insufficiency; Olecranon bursitis of right elbow; Tobacco abuse; Coronary artery disease involving native coronary artery of native heart without angina pectoris; Physical debility; Chronic pain syndrome; Cervical neck pain with evidence of disc disease; Left hip pain; Hyponatremia; Muscle cramps; Pulmonary hypertension ([place]); Hyperlipidemia; Chronic back pain; Other tear of medial meniscus, current injury, left knee, subsequent encounter; Chronic pain of left knee; Anemia; Unilateral primary osteoarthritis, left hip; Pain in left hip; and Status post total replacement of left hip on their problem list.   has a  past medical history of Acute MI ([place]), Acute on chronic diastolic heart failure ([place]) (12/01/2015), Anxiety, Arthritis, CHF (congestive heart failure) ([place]) (dx'd 2016), Chronic back pain, Chronic bronchitis ([place]), Chronic pain, Chronic pain syndrome (12/01/2015), COPD (chronic obstructive pulmonary disease) ([place]), Dyspnea, Emphysema of lung ([place]), High cholesterol, History of hiatal hernia, Hypertension, Liver fatty degeneration, Macular degeneration, right eye, and Pneumonia ( 1990s X 1; 2016).  FH:  No family history on file.  SH Social History   Tobacco Use  . Smoking status: Current Every Day Smoker    Packs/day: 1.00    Years: 45.00    Pack years: 45.00    Types: Cigarettes  . Smokeless tobacco: Former Systems developer    Types: Chew  Substance Use Topics  . Alcohol use: Yes    Comment: 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  . Drug use: Yes    Types: Marijuana    Comment: 08/06/2017 "last time was 07/2017; none in years before that"    Review of Systems Review of systems negative except for pertinent positives and negatives in history of present illness above.     Objective:    There were no vitals filed for this visit. There is no height or weight on file to calculate BMI.  Physical Exam  GEN: appears well, no apparent distress. CVS: tachycardic, RR, nl S1&S2, no murmurs, no edema RESP: no IWOB, good air movement bilaterally, CTAB GI: BS present & normal, morbidly obese MSK: Back: Normal skin, spine with normal alignment and no deformity.   ROM limited partly due to body habitus. Ambulating in the room without cane Neuro exam in LE: motor 5/5 in all muscle groups, light sensation intact in L4-S1 dermatomes Left foot: ingrown toenail on his bigtoe, no surrounding  skin erythema or swelling.  NEURO: alert and oiented appropriately, no gross deficits  PSYCH: euthymic mood with congruent affect    Assessment and Plan:  1. Ingrown left big toenail: successfully removed and  cauterized with TCA. See procedure not below  2. Chronic bilateral low back pain without sciatica: chronic issue. Database reviewed and no red flag noted. However, he reports dropping some of his pills in toilet. Unfortunately, he has to wait until his next refill which will be 01/10/2018. Obtained UDS today. Last dose yesterday morning.  - HYDROcodone-acetaminophen (NORCO/VICODIN) 5-325 MG tablet; Take 1 tablet by mouth every 8 (eight) hours as needed for moderate pain.  Dispense: 90 tablet; Refill: 0  3. Screening for colon cancer: refuses colonoscopy.  - Fecal occult blood, imunochemical  Procedure: Ingrown nail for left big toe. No infection or inflammation. Patient not on blood thinner. Patient given informed consent, signed copy in the chart. Appropriate time out taken.  Large rubber band with forceps used as tourniquet at base of the toe.  Digital block done using 1% lidocaine without epinephrine, 10 cc total.  Area prepped and draped in usual sterile fashion. The nail longitudinally through it's entire length and into the nail bed with a scissors / scalpel. Nail elevated off nail bed through it's entire length into its root using nail elevator, removed using hemostats and traction force.  Nail bed was ablated using curettage and phenol followed by copious alcohol wash. No complications.  Minimal bleeding. Triple antibiotic and sterile bandage applied and post procedure instructions given.  Patient tolerated procedure well without complications.   Return if symptoms worsen or fail to improve.  Mercy Riding, MD 01/01/18 Pager: [phone]

## 2018-01-08 LAB — TOXASSURE SELECT 13 (MW), URINE

## 2018-02-02 ENCOUNTER — Telehealth (INDEPENDENT_AMBULATORY_CARE_PROVIDER_SITE_OTHER): Payer: Self-pay

## 2018-02-02 NOTE — Telephone Encounter (Signed)
Patient would like a referral to Dr. Sherwood Gambler for back surgery or another back surgeon if possible.  CB# is [phone].  Please advise.  Thank You.

## 2018-02-02 NOTE — Telephone Encounter (Signed)
See below

## 2018-02-02 NOTE — Telephone Encounter (Signed)
I know I have seen him for his back once before and obtained x-rays however I have never ordered an MRI of his back.  I would not know what to refer him for in terms of what surgery to recommend the neurosurgeon to see him for.  He would need an MRI first before making any type of referral.  He said this done elsewhere he can then have that physician refer him to a neurosurgeon or at least get me a copy of the MRI.   if he is not had an MRI of his lumbar spine we would need one.

## 2018-02-03 ENCOUNTER — Other Ambulatory Visit (INDEPENDENT_AMBULATORY_CARE_PROVIDER_SITE_OTHER): Payer: Self-pay | Admitting: Physician Assistant

## 2018-02-03 ENCOUNTER — Other Ambulatory Visit (INDEPENDENT_AMBULATORY_CARE_PROVIDER_SITE_OTHER): Payer: Self-pay

## 2018-02-03 DIAGNOSIS — M4807 Spinal stenosis, lumbosacral region: Secondary | ICD-10-CM

## 2018-02-03 NOTE — Telephone Encounter (Signed)
Can you make him an appt to see Dr. Ninfa Linden in about 2 1/2 weeks to go over MRI Ninfa Linden just ordered please

## 2018-02-03 NOTE — Telephone Encounter (Signed)
Appt made for 3/14 with Dr. Ninfa Linden for the MRI Review

## 2018-02-10 ENCOUNTER — Other Ambulatory Visit: Payer: Self-pay | Admitting: Student

## 2018-02-10 DIAGNOSIS — G894 Chronic pain syndrome: Secondary | ICD-10-CM

## 2018-02-10 DIAGNOSIS — G8929 Other chronic pain: Secondary | ICD-10-CM

## 2018-02-10 DIAGNOSIS — M545 Low back pain: Principal | ICD-10-CM

## 2018-02-10 NOTE — Telephone Encounter (Signed)
Pt left message on nurse line requesting refill on pain medicine.  Wallace Cullens, RN

## 2018-02-11 ENCOUNTER — Encounter: Payer: Self-pay | Admitting: Student

## 2018-02-11 MED ORDER — HYDROCODONE-ACETAMINOPHEN 5-325 MG PO TABS: 1.0000 | ORAL_TABLET | Freq: Three times a day (TID) | ORAL | 0 refills | Status: DC | PRN

## 2018-02-12 ENCOUNTER — Other Ambulatory Visit: Payer: Self-pay | Admitting: Student

## 2018-02-12 DIAGNOSIS — G894 Chronic pain syndrome: Secondary | ICD-10-CM

## 2018-02-12 DIAGNOSIS — M545 Low back pain, unspecified: Secondary | ICD-10-CM

## 2018-02-12 DIAGNOSIS — G8929 Other chronic pain: Secondary | ICD-10-CM

## 2018-02-12 MED ORDER — HYDROCODONE-ACETAMINOPHEN 5-325 MG PO TABS: 1.0000 | ORAL_TABLET | Freq: Three times a day (TID) | ORAL | 0 refills | Status: DC | PRN

## 2018-02-12 NOTE — Telephone Encounter (Signed)
Pt would like a refill on his norco sent to [facility] [address]. He has cancelled the refill with [facility] because it was going to take a week to ship it.

## 2018-02-12 NOTE — Progress Notes (Signed)
Called and cancelled his Norco prescription sent to [facility] on 02/11/2018 per patient's request. Cancellation confirmed by Melissa. [facility] [address] in [place] as requested by patient.

## 2018-02-17 ENCOUNTER — Ambulatory Visit
Admission: RE | Admit: 2018-02-17 | Discharge: 2018-02-17 | Disposition: A | Discharge status: Home / Self Care | Payer: Managed Care, Other (non HMO) | Source: Ambulatory Visit | Attending: Orthopaedic Surgery | Admitting: Orthopaedic Surgery

## 2018-02-17 DIAGNOSIS — M4807 Spinal stenosis, lumbosacral region: Secondary | ICD-10-CM

## 2018-02-17 DIAGNOSIS — M48061 Spinal stenosis, lumbar region without neurogenic claudication: Secondary | ICD-10-CM | POA: Diagnosis not present

## 2018-02-19 ENCOUNTER — Ambulatory Visit (INDEPENDENT_AMBULATORY_CARE_PROVIDER_SITE_OTHER): Payer: Managed Care, Other (non HMO) | Admitting: Orthopaedic Surgery

## 2018-02-19 ENCOUNTER — Encounter (INDEPENDENT_AMBULATORY_CARE_PROVIDER_SITE_OTHER): Payer: Self-pay | Admitting: Orthopaedic Surgery

## 2018-02-19 DIAGNOSIS — M47816 Spondylosis without myelopathy or radiculopathy, lumbar region: Secondary | ICD-10-CM | POA: Insufficient documentation

## 2018-02-19 DIAGNOSIS — M48061 Spinal stenosis, lumbar region without neurogenic claudication: Secondary | ICD-10-CM

## 2018-02-19 DIAGNOSIS — M4696 Unspecified inflammatory spondylopathy, lumbar region: Secondary | ICD-10-CM

## 2018-02-19 NOTE — Progress Notes (Signed)
The patient comes in today to go over an MRI of his lumbar spine.  He has chronic back pain that does not radiate down his legs and there is no complaint of sciatica but hurts quite a bit when he is been standing for long period time or goes to get up.  I have actually performed a left total hip arthroplasty on him in August of last year and he is doing great from that.  He does have a significant amount of truncal obesity.  He has had previous cervical spine surgery by Dr. Sherwood Gambler here in town.  He denies any change in bowel bladder function.  On exam he does hurt in the mid lumbar spine to lower lumbar spine with flexion extension and to palpation.  He has negative straight leg raise bilaterally.  The MRI shows multifactorial stenosis at mainly L3-L4 and L4-L5 with a little bit at L2-L3.  This is from facet hypertrophy and ligamentous hypertrophy.  He does have disc bulges at these areas as well but no evidence of neural impingement.  At this point he understands that losing weight and strengthen his core muscles will help him the most.  We can always have him seen by Dr. Ernestina Patches for facet joint injections at L3-L4 and potentially L4-L5 and he will let us know if he points that.  From my standpoint I do not need to see him back until July.  I would like a low AP pelvis and a lateral of his left operative hip at that visit.

## 2018-03-05 ENCOUNTER — Telehealth (INDEPENDENT_AMBULATORY_CARE_PROVIDER_SITE_OTHER): Payer: Self-pay | Admitting: Physical Medicine and Rehabilitation

## 2018-03-05 NOTE — Telephone Encounter (Signed)
Bilateral L3-4 and L4-5 facet joint blocks.

## 2018-03-06 NOTE — Telephone Encounter (Signed)
Needs auth for bilateral [id number] and [id number]- bilateral L3-4 and L4-5 facets. Patient aware we will call to schedule after we get authorization.

## 2018-03-10 NOTE — Telephone Encounter (Signed)
Submitted clinical notes to [employer], case is pending

## 2018-03-11 NOTE — Telephone Encounter (Signed)
Authorization # [id number] Authorization Effective Dates 03/16/18-06/14/18  Pt is schedued for 03/30/18 with Driver, No BTs

## 2018-03-12 ENCOUNTER — Encounter: Payer: Self-pay | Admitting: Student

## 2018-03-13 ENCOUNTER — Other Ambulatory Visit: Payer: Self-pay | Admitting: Student

## 2018-03-13 DIAGNOSIS — G894 Chronic pain syndrome: Secondary | ICD-10-CM

## 2018-03-13 DIAGNOSIS — M545 Low back pain: Principal | ICD-10-CM

## 2018-03-13 DIAGNOSIS — G8929 Other chronic pain: Secondary | ICD-10-CM

## 2018-03-16 ENCOUNTER — Other Ambulatory Visit: Payer: Self-pay | Admitting: Student

## 2018-03-16 ENCOUNTER — Encounter: Payer: Self-pay | Admitting: Student

## 2018-03-16 MED ORDER — HYDROCODONE-ACETAMINOPHEN 5-325 MG PO TABS: 1.0000 | ORAL_TABLET | Freq: Three times a day (TID) | ORAL | 0 refills | Status: DC | PRN

## 2018-03-16 NOTE — Telephone Encounter (Signed)
Pt calling requesting this refill on his Hydrocodone.

## 2018-03-30 ENCOUNTER — Ambulatory Visit (INDEPENDENT_AMBULATORY_CARE_PROVIDER_SITE_OTHER): Payer: Managed Care, Other (non HMO) | Admitting: Physical Medicine and Rehabilitation

## 2018-03-30 ENCOUNTER — Encounter (INDEPENDENT_AMBULATORY_CARE_PROVIDER_SITE_OTHER): Payer: Self-pay | Admitting: Physical Medicine and Rehabilitation

## 2018-03-30 ENCOUNTER — Ambulatory Visit (INDEPENDENT_AMBULATORY_CARE_PROVIDER_SITE_OTHER): Payer: Managed Care, Other (non HMO)

## 2018-03-30 VITALS — BP 131/84 | HR 77 | Temp 98.0°F

## 2018-03-30 DIAGNOSIS — G8929 Other chronic pain: Secondary | ICD-10-CM

## 2018-03-30 DIAGNOSIS — M47816 Spondylosis without myelopathy or radiculopathy, lumbar region: Secondary | ICD-10-CM | POA: Diagnosis not present

## 2018-03-30 DIAGNOSIS — M545 Low back pain: Secondary | ICD-10-CM | POA: Diagnosis not present

## 2018-03-30 MED ORDER — METHYLPREDNISOLONE ACETATE 80 MG/ML IJ SUSP
80.0000 mg | Freq: Once | INTRAMUSCULAR | Status: AC
  Administered 2018-03-30: 80 mg

## 2018-03-30 NOTE — Progress Notes (Signed)
 .  Numeric Pain Rating Scale and Functional Assessment Average Pain 8   In the last MONTH (on 0-10 scale) has pain interfered with the following?  1. General activity like being  able to carry out your everyday physical activities such as walking, climbing stairs, carrying groceries, or moving a chair?  Rating(4)   +Driver, -BT, -Dye Allergies.  

## 2018-03-30 NOTE — Patient Instructions (Signed)

## 2018-03-31 ENCOUNTER — Encounter (INDEPENDENT_AMBULATORY_CARE_PROVIDER_SITE_OTHER): Payer: Self-pay | Admitting: Physical Medicine and Rehabilitation

## 2018-03-31 NOTE — Progress Notes (Signed)
George Leon - 62 y.o. male MRN [id number]  Date of birth: Nov 01, 1956  Office Visit Note: Visit Date: 03/30/2018 PCP: Mercy Riding, MD Referred by: Mercy Riding, MD  Subjective: Chief Complaint  Patient presents with  . Lower Back - Pain   HPI: George Leon is a 62 year old gentleman who comes in today at the request of Dr. Ninfa Linden for diagnostic facet joint blocks at L3-4 and L4-5.  He has a prior left total hip arthroplasty and I have actually seen him on one occasion for diagnostic arthrogram.  He has had chronic issues with low back pain for many years and endorses remote injury.  He has had no lumbar spine surgery but has had cervical fusion by Dr. Sherwood Gambler.  He reports no leg pain.  He reports no claudication symptoms.  Pain is worse with going from sit to stand and standing for even 5-10 minutes.  He gets centralized low back pain that spreads laterally.  He is failed conservative care including prior physical therapy when he was having a lot of hip and back pain.  He has had some issue with essentially chronic pain syndrome.  MRI was obtained by Dr. Ninfa Linden and this is reviewed with the patient.  He basically has facet arthropathy at L3-4 and L4-5 and slightly at L5-S1.  There is moderate narrowing of the canal but no nerve compression.  He reports his average pain is in a but can be 10 out of 10 when he stands.  It does affect his activities of daily living.  He gets mild relief with sitting down and using the heating pad.  He has had no red flag symptoms of radicular pain or focal weakness.   Review of Systems  Constitutional: Negative for chills, fever, malaise/fatigue and weight loss.  HENT: Negative for hearing loss and sinus pain.   Eyes: Negative for blurred vision, double vision and photophobia.  Respiratory: Negative for cough and shortness of breath.   Cardiovascular: Negative for chest pain, palpitations and leg swelling.  Gastrointestinal: Negative for abdominal pain,  nausea and vomiting.  Genitourinary: Negative for flank pain.  Musculoskeletal: Positive for back pain and joint pain. Negative for falls and myalgias.  Skin: Negative for itching and rash.  Neurological: Negative for tremors, focal weakness and weakness.  Endo/Heme/Allergies: Negative.   Psychiatric/Behavioral: Negative for depression.  All other systems reviewed and are negative.  Otherwise per HPI.  Assessment & Plan: Visit Diagnoses:  1. Spondylosis without myelopathy or radiculopathy, lumbar region   2. Chronic bilateral low back pain without sciatica     Plan: Findings:  Chronic worsening axial mechanical low back pain which is worse with standing and worse with extension on exam and facet joint loading.  This appears to be related to the L3-4 L4-5 levels according to exam and imaging.  He is failed conservative care as noted in the history of present illness.  He has had prior total hip arthroplasty.  We are going to complete diagnostic facet joint/medial branch blocks.  These would be done at the L3-4 and L4-5 level with fluoroscopic guidance.  Plan would be double block paradigm if he gets relief.  We will give him a pain diary.  We would look forward to possibly radiofrequency ablation.    Meds & Orders:  Meds ordered this encounter  Medications  . methylPREDNISolone acetate (DEPO-MEDROL) injection 80 mg    Orders Placed This Encounter  Procedures  . Facet Injection  . XR C-ARM NO  REPORT    Follow-up: Return if symptoms worsen or fail to improve.   Procedures: No procedures performed  Lumbar Diagnostic Facet Joint Nerve Block with Fluoroscopic Guidance   Patient: George Leon      Date of Birth: [birth year] MRN: [id number] PCP: Mercy Riding, MD      Visit Date: 03/30/2018   Universal Protocol:    Date/Time: 04/23/196:20 AM  Consent Given By: the patient  Position: PRONE  Additional Comments: Vital signs were monitored before and after the  procedure. Patient was prepped and draped in the usual sterile fashion. The correct patient, procedure, and site was verified.   Injection Procedure Details:  Procedure Site One Meds Administered:  Meds ordered this encounter  Medications  . methylPREDNISolone acetate (DEPO-MEDROL) injection 80 mg     Laterality: Bilateral  Location/Site:  L3-L4 L4-L5  Needle size: 22 ga.  Needle type:spinal  Needle Placement: Oblique pedical  Findings:   -Comments: There was excellent flow of contrast along the articular pillars without intravascular flow.  Procedure Details: The fluoroscope beam is vertically oriented in AP and then obliqued 15 to 20 degrees to the ipsilateral side of the desired nerve to achieve the "Scotty dog" appearance.  The skin over the target area of the junction of the superior articulating process and the transverse process (sacral ala if blocking the L5 dorsal rami) was locally anesthetized with a 1 ml volume of 1% Lidocaine without Epinephrine.  The spinal needle was inserted and advanced in a trajectory view down to the target.   After contact with periosteum and negative aspirate for blood and CSF, correct placement without intravascular or epidural spread was confirmed by injecting 0.5 ml. of Isovue-250.  A spot radiograph was obtained of this image.    Next, a 0.5 ml. volume of the injectate described above was injected. The needle was then redirected to the other facet joint nerves mentioned above if needed.  Prior to the procedure, the patient was given a Pain Diary which was completed for baseline measurements.  After the procedure, the patient rated their pain every 30 minutes and will continue rating at this frequency for a total of 5 hours.  The patient has been asked to complete the Diary and return to [place] by mail, fax or hand delivered as soon as possible.   Additional Comments:  The patient tolerated the procedure well Dressing: Band-Aid     Post-procedure details: Patient was observed during the procedure. Post-procedure instructions were reviewed.  Patient left the clinic in stable condition.   Clinical History: MRI LUMBAR SPINE WITHOUT CONTRAST  TECHNIQUE: Multiplanar, multisequence MR imaging of the lumbar spine was performed. No intravenous contrast was administered.  COMPARISON:  Radiography 03/03/2017  FINDINGS: Segmentation:  5 lumbar type vertebral bodies assumed.  Alignment:  Curvature convex to the right with the apex at L2.  Vertebrae:  No fracture or primary bone lesion.  Conus medullaris and cauda equina: Conus extends to the L1 level. Conus and cauda equina appear normal.  Paraspinal and other soft tissues: Negative  Disc levels:  T11-12, T12-L1 and L1-2: No disc pathology. Mild facet hypertrophy. No stenosis.  L2-3: Degeneration of the disc with mild circumferential bulging. Facet and ligamentous hypertrophy. Mild multifactorial spinal stenosis with some potential for neural compression on the left.  L3-4: Disc degeneration with circumferential bulging. Facet and ligamentous hypertrophy. Moderate multifactorial spinal stenosis with potential for neural compression on either side.  L4-5: Disc degeneration with circumferential bulging slightly more towards  the left. Facet and ligamentous hypertrophy. Mild to moderate multifactorial stenosis with some potential for neural compression in either lateral recess or neural foramen.  L5-S1: Normal appearance of the disc. Mild facet degeneration. No compressive stenosis.  IMPRESSION: Curvature convex to the right.  Disc degeneration at L2-3, L3-4 and L4-5 with circumferential bulging of the discs. Facet and ligamentous hypertrophy. Multifactorial spinal stenosis, most pronounced at L3-4 where it would be categorized as moderate. There is potential for neural compression on either or both sides at L3-4. At L4-5,  stenosis primarily affects the lateral recesses and neural foramina and could also be symptomatic. At L2-3, stenosis is most pronounced in the left lateral recess and could be symptomatic.   Electronically Signed   By: Nelson Chimes M.D.   On: 02/17/2018 08:30   He reports that he has been smoking cigarettes.  He has a 45.00 pack-year smoking history. He has quit using smokeless tobacco. His smokeless tobacco use included chew.  Recent Labs    08/04/17 1515  HGBA1C 6.1    Objective:  VS:  HT:    WT:   BMI:     BP:131/84  HR:77bpm  TEMP:98 F (36.7 C)(Oral)  RESP:96 % Physical Exam  Constitutional: He is oriented to person, place, and time. He appears well-developed and well-nourished. No distress.  HENT:  Head: Normocephalic and atraumatic.  Nose: Nose normal.  Mouth/Throat: Oropharynx is clear and moist.  Eyes: Pupils are equal, round, and reactive to light. Conjunctivae are normal.  Neck: Normal range of motion. Neck supple. No tracheal deviation present.  Cardiovascular: Regular rhythm and intact distal pulses.  Pulmonary/Chest: Effort normal and breath sounds normal.  Abdominal: Soft. He exhibits no distension. There is no rebound and no guarding.  Musculoskeletal: He exhibits no deformity.  Patient has some difficulty going from sit to stand.  He has pain with extension rotation of the lumbar spine which is concordant for his low back pain and essentially facet joint loading.  He has no pain over the greater trochanters.  He has good hip range of motion.  He has good distal strength.  Neurological: He is alert and oriented to person, place, and time. He exhibits normal muscle tone. Coordination normal.  Skin: Skin is warm. No rash noted.  Psychiatric: He has a normal mood and affect. His behavior is normal.  Nursing note and vitals reviewed.   Ortho Exam Imaging: Xr C-arm No Report  Result Date: 03/30/2018 Please see Notes or Procedures tab for imaging  impression.   Past Medical/Family/Surgical/Social History: Medications & Allergies reviewed per EMR, new medications updated. Patient Active Problem List   Diagnosis Date Noted  . Facet arthritis of lumbar region 02/19/2018  . 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  . Unilateral primary osteoarthritis, left hip 07/02/2017  . Pain in left hip 07/02/2017  . Anemia 05/12/2017  . Other tear of medial meniscus, current injury, left knee, subsequent encounter 04/21/2017  . Chronic pain of left knee 04/21/2017  . Chronic back pain 02/12/2017  . Pulmonary hypertension ([place]) 09/13/2016  . Hyperlipidemia 09/13/2016  . Hyponatremia 04/15/2016  . Muscle cramps 04/15/2016  . Left hip pain 03/26/2016  . Cervical neck pain with evidence of disc disease 01/18/2016  . Physical debility 12/01/2015  . Chronic pain syndrome 12/01/2015  . Tobacco abuse   . Coronary artery disease involving native coronary artery of native heart without angina pectoris   .  Olecranon bursitis of right elbow 09/21/2015  . Chronic venous insufficiency 06/19/2015  . Diastolic CHF ([place]) 23/53/6144  . Proteinuria 05/22/2015  . Macular degeneration, 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  . Bilateral lower extremity edema 05/11/2015  . Osteoarthritis of left knee 12/28/2014  . Osteoarthritis of right knee 12/28/2014  . Ingrown left big toenail 04/22/2014  . Peripheral vascular disease ([place]) 08/23/2013  . Disorder of SI (sacroiliac) joint 03/30/2013  . GANGLION CYST, WRIST, LEFT 11/16/2010  . Chronic obstructive pulmonary disease ([place]) 10/26/2010  . Osteoarthritis of multiple joints 08/25/2008  . OBESITY, MORBID 05/26/2008  . GERD 06/19/2007  . TOBACCO ABUSE 04/07/2007  . Preventative health care 04/07/2007  . HYPERCHOLESTEROLEMIA 02/16/2007  . HYPERTENSION, BENIGN 02/16/2007   Past Medical History:  Diagnosis Date  . Acute MI ([place])   .  Acute on chronic diastolic heart failure ([place]) 12/01/2015  . Anxiety   . Arthritis    "qwhere" (08/06/2017)  . CHF (congestive heart failure) ([place]) dx'd 2016  . Chronic back pain    "all my back" (08/06/2017)  . Chronic bronchitis ([place])   . Chronic pain   . Chronic pain syndrome 12/01/2015  . COPD (chronic obstructive pulmonary disease) ([place])   . Dyspnea    w/ exertion   . Emphysema of lung ([place])   . High cholesterol   . History of hiatal hernia    possible    . Hypertension   . Liver fatty degeneration   . Macular degeneration, right eye   . Pneumonia  1990s X 1; 2016   History reviewed. No pertinent family history. Past Surgical History:  Procedure Laterality Date  . ANTERIOR CERVICAL DECOMP/DISCECTOMY FUSION  1988   "plate; cadavar bone; couple bolts"  . BACK SURGERY    . CARDIAC CATHETERIZATION    . JOINT REPLACEMENT    . KNEE ARTHROSCOPY Left 05/27/2017   Procedure: LEFT KNEE ARTHROSCOPY WITH PARTIAL MEDIAL MENISCECTOMY;  Surgeon: Mcarthur Rossetti, MD;  Location: [facility];  Service: Orthopedics;  Laterality: Left;  . SHOULDER ARTHROSCOPY WITH ROTATOR CUFF REPAIR Right   . TOTAL HIP ARTHROPLASTY Left 08/05/2017   Procedure: LEFT TOTAL HIP ARTHROPLASTY ANTERIOR APPROACH;  Surgeon: Mcarthur Rossetti, MD;  Location: [facility];  Service: Orthopedics;  Laterality: Left;   Social History   Occupational History  . Not on file  Tobacco Use  . Smoking status: Current Every Day Smoker    Packs/day: 1.00    Years: 45.00    Pack years: 45.00    Types: Cigarettes  . Smokeless tobacco: Former Systems developer    Types: Chew  Substance and Sexual Activity  . Alcohol use: Yes    Comment: 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  . Drug use: Yes    Types: Marijuana    Comment: 08/06/2017 "last time was 07/2017; none in years before that"  . Sexual activity: Never

## 2018-03-31 NOTE — Procedures (Signed)
Lumbar Diagnostic Facet Joint Nerve Block with Fluoroscopic Guidance   Patient: George Leon      Date of Birth: 01/02/1956 MRN: [id number] PCP: Mercy Riding, MD      Visit Date: 03/30/2018   Universal Protocol:    Date/Time: 04/23/196:20 AM  Consent Given By: the patient  Position: PRONE  Additional Comments: Vital signs were monitored before and after the procedure. Patient was prepped and draped in the usual sterile fashion. The correct patient, procedure, and site was verified.   Injection Procedure Details:  Procedure Site One Meds Administered:  Meds ordered this encounter  Medications  . methylPREDNISolone acetate (DEPO-MEDROL) injection 80 mg     Laterality: Bilateral  Location/Site:  L3-L4 L4-L5  Needle size: 22 ga.  Needle type:spinal  Needle Placement: Oblique pedical  Findings:   -Comments: There was excellent flow of contrast along the articular pillars without intravascular flow.  Procedure Details: The fluoroscope beam is vertically oriented in AP and then obliqued 15 to 20 degrees to the ipsilateral side of the desired nerve to achieve the "Scotty dog" appearance.  The skin over the target area of the junction of the superior articulating process and the transverse process (sacral ala if blocking the L5 dorsal rami) was locally anesthetized with a 1 ml volume of 1% Lidocaine without Epinephrine.  The spinal needle was inserted and advanced in a trajectory view down to the target.   After contact with periosteum and negative aspirate for blood and CSF, correct placement without intravascular or epidural spread was confirmed by injecting 0.5 ml. of Isovue-250.  A spot radiograph was obtained of this image.    Next, a 0.5 ml. volume of the injectate described above was injected. The needle was then redirected to the other facet joint nerves mentioned above if needed.  Prior to the procedure, the patient was given a Pain Diary which was completed for  baseline measurements.  After the procedure, the patient rated their pain every 30 minutes and will continue rating at this frequency for a total of 5 hours.  The patient has been asked to complete the Diary and return to [place] by mail, fax or hand delivered as soon as possible.   Additional Comments:  The patient tolerated the procedure well Dressing: Band-Aid    Post-procedure details: Patient was observed during the procedure. Post-procedure instructions were reviewed.  Patient left the clinic in stable condition.

## 2018-04-12 ENCOUNTER — Other Ambulatory Visit: Payer: Self-pay | Admitting: Student

## 2018-04-12 DIAGNOSIS — G8929 Other chronic pain: Secondary | ICD-10-CM

## 2018-04-12 DIAGNOSIS — M545 Low back pain: Principal | ICD-10-CM

## 2018-04-12 DIAGNOSIS — G894 Chronic pain syndrome: Secondary | ICD-10-CM

## 2018-04-13 MED ORDER — HYDROCODONE-ACETAMINOPHEN 5-325 MG PO TABS: 1.0000 | ORAL_TABLET | Freq: Three times a day (TID) | ORAL | 0 refills | Status: DC | PRN

## 2018-04-14 ENCOUNTER — Other Ambulatory Visit (INDEPENDENT_AMBULATORY_CARE_PROVIDER_SITE_OTHER): Payer: Self-pay | Admitting: Orthopaedic Surgery

## 2018-04-14 MED ORDER — NYSTATIN 100000 UNIT/GM EX CREA: TOPICAL_CREAM | Freq: Two times a day (BID) | CUTANEOUS | 0 refills | Status: DC

## 2018-04-14 NOTE — Telephone Encounter (Signed)
Please advise 

## 2018-04-14 NOTE — Telephone Encounter (Signed)
[facility] patient 

## 2018-05-11 ENCOUNTER — Other Ambulatory Visit: Payer: Self-pay | Admitting: Student

## 2018-05-11 DIAGNOSIS — M545 Low back pain, unspecified: Secondary | ICD-10-CM

## 2018-05-11 DIAGNOSIS — G894 Chronic pain syndrome: Secondary | ICD-10-CM

## 2018-05-11 DIAGNOSIS — G8929 Other chronic pain: Secondary | ICD-10-CM

## 2018-05-11 MED ORDER — HYDROCODONE-ACETAMINOPHEN 5-325 MG PO TABS: 1.0000 | ORAL_TABLET | Freq: Three times a day (TID) | ORAL | 0 refills | Status: DC | PRN

## 2018-05-18 ENCOUNTER — Other Ambulatory Visit: Payer: Self-pay

## 2018-05-18 DIAGNOSIS — J449 Chronic obstructive pulmonary disease, unspecified: Secondary | ICD-10-CM

## 2018-05-18 DIAGNOSIS — R52 Pain, unspecified: Secondary | ICD-10-CM

## 2018-05-19 MED ORDER — DULOXETINE HCL 60 MG PO CPEP: 60.0000 mg | ORAL_CAPSULE | Freq: Every day | ORAL | 3 refills | Status: DC

## 2018-05-19 MED ORDER — ALBUTEROL SULFATE HFA 108 (90 BASE) MCG/ACT IN AERS: 1.0000 | INHALATION_SPRAY | RESPIRATORY_TRACT | 2 refills | Status: DC | PRN

## 2018-05-21 ENCOUNTER — Other Ambulatory Visit: Payer: Self-pay | Admitting: Student

## 2018-05-21 DIAGNOSIS — I10 Essential (primary) hypertension: Secondary | ICD-10-CM

## 2018-06-03 ENCOUNTER — Other Ambulatory Visit: Payer: Self-pay | Admitting: Student

## 2018-06-03 ENCOUNTER — Encounter: Payer: Self-pay | Admitting: Student

## 2018-06-03 DIAGNOSIS — G894 Chronic pain syndrome: Secondary | ICD-10-CM

## 2018-06-03 DIAGNOSIS — G8929 Other chronic pain: Secondary | ICD-10-CM

## 2018-06-03 DIAGNOSIS — M545 Low back pain, unspecified: Secondary | ICD-10-CM

## 2018-06-04 MED ORDER — HYDROCODONE-ACETAMINOPHEN 5-325 MG PO TABS: 1.0000 | ORAL_TABLET | Freq: Three times a day (TID) | ORAL | 0 refills | Status: DC | PRN

## 2018-06-08 ENCOUNTER — Other Ambulatory Visit: Payer: Self-pay | Admitting: Student

## 2018-06-08 ENCOUNTER — Encounter: Payer: Self-pay | Admitting: Student in an Organized Health Care Education/Training Program

## 2018-06-08 DIAGNOSIS — M545 Low back pain, unspecified: Secondary | ICD-10-CM

## 2018-06-08 DIAGNOSIS — G894 Chronic pain syndrome: Secondary | ICD-10-CM

## 2018-06-08 DIAGNOSIS — G8929 Other chronic pain: Secondary | ICD-10-CM

## 2018-06-08 NOTE — Telephone Encounter (Signed)
George Leon is requesting a refill on his vicodin, however it appears this was last filled 4 days ago on 6/27 by Dr. Cyndia Skeeters. I will not refill at this time.   Additionally can you please call and ask the patient to come in prior to his next refill to discuss chronic pain syndrome? He is a new patient to me.  Thank you.

## 2018-06-09 NOTE — Telephone Encounter (Signed)
Pt calls back.  He was aware he requested too early (see mychart message).  Appt made for 06/25/18 to meet new MD. Clinton Sawyer, Salome Spotted, [place]

## 2018-06-09 NOTE — Telephone Encounter (Signed)
LVM for pt to call office back to inform him of below and assist him in scheduling an appointment prior to his next refill. Please inform him and assist him in this if he calls back. Katharina Caper, April D, [place]

## 2018-06-10 ENCOUNTER — Ambulatory Visit: Payer: Medicare Other

## 2018-06-10 ENCOUNTER — Other Ambulatory Visit: Payer: Self-pay | Admitting: *Deleted

## 2018-06-10 DIAGNOSIS — G894 Chronic pain syndrome: Secondary | ICD-10-CM

## 2018-06-10 DIAGNOSIS — M545 Low back pain: Principal | ICD-10-CM

## 2018-06-10 DIAGNOSIS — G8929 Other chronic pain: Secondary | ICD-10-CM

## 2018-06-10 MED ORDER — HYDROCODONE-ACETAMINOPHEN 5-325 MG PO TABS: 1.0000 | ORAL_TABLET | Freq: Three times a day (TID) | ORAL | 0 refills | Status: DC | PRN

## 2018-06-10 NOTE — Telephone Encounter (Signed)
Pt called nurse line stating his pain medication could not be processed due to previous pcp no longer valid here. 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  They will need a new script sent with instructions to void the previous script.  Will forward to preceptor since new PCP is out of office at this time. Gailen Venne, Salome Spotted, CMA

## 2018-06-12 ENCOUNTER — Other Ambulatory Visit: Payer: Self-pay

## 2018-06-12 DIAGNOSIS — G894 Chronic pain syndrome: Secondary | ICD-10-CM

## 2018-06-12 DIAGNOSIS — G8929 Other chronic pain: Secondary | ICD-10-CM

## 2018-06-12 DIAGNOSIS — M545 Low back pain: Principal | ICD-10-CM

## 2018-06-12 MED ORDER — HYDROCODONE-ACETAMINOPHEN 5-325 MG PO TABS: 1.0000 | ORAL_TABLET | Freq: Three times a day (TID) | ORAL | 0 refills | Status: DC | PRN

## 2018-06-12 NOTE — Telephone Encounter (Signed)
Pt called for refill of Norco. Rx sent to Mail order. That Rx has been cancelled. [facility] needs [facility] [address] [facility]  Ottis Stain, CMA

## 2018-06-12 NOTE — Telephone Encounter (Signed)
Sent new Rx to [facility]  Patient has apt with Dr Burr Medico on 7/18  Will need to address plans for contd narcotic Rxs

## 2018-06-25 ENCOUNTER — Ambulatory Visit: Payer: Medicare Other | Admitting: Student in an Organized Health Care Education/Training Program

## 2018-06-25 ENCOUNTER — Ambulatory Visit (INDEPENDENT_AMBULATORY_CARE_PROVIDER_SITE_OTHER): Payer: Managed Care, Other (non HMO) | Admitting: Orthopaedic Surgery

## 2018-06-29 ENCOUNTER — Encounter: Payer: Self-pay | Admitting: Student in an Organized Health Care Education/Training Program

## 2018-06-30 ENCOUNTER — Other Ambulatory Visit: Payer: Self-pay | Admitting: Student in an Organized Health Care Education/Training Program

## 2018-06-30 DIAGNOSIS — I10 Essential (primary) hypertension: Secondary | ICD-10-CM

## 2018-06-30 MED ORDER — LOSARTAN POTASSIUM 100 MG PO TABS: 100.0000 mg | ORAL_TABLET | Freq: Every day | ORAL | 3 refills | Status: DC

## 2018-07-06 ENCOUNTER — Ambulatory Visit (INDEPENDENT_AMBULATORY_CARE_PROVIDER_SITE_OTHER): Payer: Managed Care, Other (non HMO) | Admitting: Orthopaedic Surgery

## 2018-07-06 ENCOUNTER — Ambulatory Visit (INDEPENDENT_AMBULATORY_CARE_PROVIDER_SITE_OTHER): Payer: Managed Care, Other (non HMO) | Admitting: Student in an Organized Health Care Education/Training Program

## 2018-07-06 ENCOUNTER — Ambulatory Visit (INDEPENDENT_AMBULATORY_CARE_PROVIDER_SITE_OTHER): Payer: Managed Care, Other (non HMO)

## 2018-07-06 ENCOUNTER — Encounter: Payer: Self-pay | Admitting: Student in an Organized Health Care Education/Training Program

## 2018-07-06 ENCOUNTER — Other Ambulatory Visit: Payer: Self-pay

## 2018-07-06 ENCOUNTER — Encounter (INDEPENDENT_AMBULATORY_CARE_PROVIDER_SITE_OTHER): Payer: Self-pay | Admitting: Orthopaedic Surgery

## 2018-07-06 DIAGNOSIS — Z96642 Presence of left artificial hip joint: Secondary | ICD-10-CM

## 2018-07-06 DIAGNOSIS — I10 Essential (primary) hypertension: Secondary | ICD-10-CM

## 2018-07-06 DIAGNOSIS — G8929 Other chronic pain: Secondary | ICD-10-CM | POA: Diagnosis not present

## 2018-07-06 DIAGNOSIS — M545 Low back pain, unspecified: Secondary | ICD-10-CM

## 2018-07-06 DIAGNOSIS — G894 Chronic pain syndrome: Secondary | ICD-10-CM

## 2018-07-06 DIAGNOSIS — K219 Gastro-esophageal reflux disease without esophagitis: Secondary | ICD-10-CM

## 2018-07-06 MED ORDER — HYDROCODONE-ACETAMINOPHEN 5-325 MG PO TABS: 1.0000 | ORAL_TABLET | Freq: Three times a day (TID) | ORAL | 0 refills | Status: DC | PRN

## 2018-07-06 MED ORDER — AMLODIPINE BESYLATE 5 MG PO TABS: 5.0000 mg | ORAL_TABLET | Freq: Every day | ORAL | 0 refills | Status: DC

## 2018-07-06 MED ORDER — ESOMEPRAZOLE MAGNESIUM 40 MG PO CPDR: 40.0000 mg | DELAYED_RELEASE_CAPSULE | Freq: Every day | ORAL | 0 refills | Status: DC | PRN

## 2018-07-06 MED ORDER — METHOCARBAMOL 500 MG PO TABS: 500.0000 mg | ORAL_TABLET | Freq: Four times a day (QID) | ORAL | 1 refills | Status: DC
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
## 2018-07-06 NOTE — Patient Instructions (Signed)
It was a pleasure seeing you today in our clinic.   Sign up for My Chart to have easy access to your labs results, and communication with your primary care physician.  Our clinic's number is [phone]. Please call with questions or concerns about what we discussed today.  Be well, Dr. Burr Medico

## 2018-07-06 NOTE — Progress Notes (Signed)
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  He is doing well overall as far as her hip goes.  He said just a little bit of chest pain today.  He is not diaphoretic appearing in the office.  He is breathing normally.  His blood pressure was normal.  His heart rate was just a little bit tachycardic at 106.  He does tolerate me putting his left operative hip through internal and external rotation without any difficulty.  He gets up out of a chair easily without any problems.  His leg lengths appear equal.  He does have some pain of the trochanteric area.  X-rays of his pelvis and left hip show a total hip arthroplasty on the left side with no complicating features no evidence of loosening.  At this point as far as his hip goes a follow-up as needed.  All questions concerns were answered and addressed.  He is actually going over to his primary care physician's office from here.  All question concerns were answered and addressed.

## 2018-09-01 ENCOUNTER — Other Ambulatory Visit (INDEPENDENT_AMBULATORY_CARE_PROVIDER_SITE_OTHER): Payer: Self-pay | Admitting: Orthopaedic Surgery

## 2018-09-01 NOTE — Telephone Encounter (Signed)
Ok to refill 

## 2018-09-21 ENCOUNTER — Ambulatory Visit (INDEPENDENT_AMBULATORY_CARE_PROVIDER_SITE_OTHER): Payer: Managed Care, Other (non HMO)

## 2018-09-21 DIAGNOSIS — Z23 Encounter for immunization: Secondary | ICD-10-CM

## 2018-09-21 NOTE — Progress Notes (Signed)
Pt came in with son. Pt requested flu shot. Flu shot given in RD. Pt tolerated well Ottis Stain, CMA

## 2018-09-22 ENCOUNTER — Other Ambulatory Visit: Payer: Self-pay | Admitting: Student in an Organized Health Care Education/Training Program

## 2018-09-22 DIAGNOSIS — K219 Gastro-esophageal reflux disease without esophagitis: Secondary | ICD-10-CM

## 2018-09-23 ENCOUNTER — Other Ambulatory Visit: Payer: Self-pay | Admitting: Student in an Organized Health Care Education/Training Program

## 2018-09-23 DIAGNOSIS — I10 Essential (primary) hypertension: Secondary | ICD-10-CM

## 2018-10-09 ENCOUNTER — Encounter: Payer: Self-pay | Admitting: Student in an Organized Health Care Education/Training Program

## 2018-10-10 ENCOUNTER — Other Ambulatory Visit (INDEPENDENT_AMBULATORY_CARE_PROVIDER_SITE_OTHER): Payer: Self-pay | Admitting: Orthopaedic Surgery

## 2018-10-12 ENCOUNTER — Encounter: Payer: Self-pay | Admitting: Student in an Organized Health Care Education/Training Program

## 2018-10-12 MED ORDER — NYSTATIN 100000 UNIT/GM EX CREA: TOPICAL_CREAM | Freq: Two times a day (BID) | CUTANEOUS | 0 refills | Status: DC

## 2018-10-12 NOTE — Telephone Encounter (Signed)
CB patient  

## 2018-10-15 ENCOUNTER — Other Ambulatory Visit: Payer: Self-pay | Admitting: Student in an Organized Health Care Education/Training Program

## 2018-10-15 DIAGNOSIS — M545 Low back pain, unspecified: Secondary | ICD-10-CM

## 2018-10-15 DIAGNOSIS — G8929 Other chronic pain: Secondary | ICD-10-CM

## 2018-10-15 DIAGNOSIS — G894 Chronic pain syndrome: Secondary | ICD-10-CM

## 2018-10-15 MED ORDER — HYDROCODONE-ACETAMINOPHEN 5-325 MG PO TABS: 1.0000 | ORAL_TABLET | Freq: Three times a day (TID) | ORAL | 0 refills | Status: DC | PRN

## 2018-11-11 ENCOUNTER — Telehealth: Payer: Self-pay | Admitting: Student in an Organized Health Care Education/Training Program

## 2018-11-11 ENCOUNTER — Encounter: Payer: Self-pay | Admitting: *Deleted

## 2018-11-11 ENCOUNTER — Other Ambulatory Visit: Payer: Self-pay | Admitting: Student in an Organized Health Care Education/Training Program

## 2018-11-11 DIAGNOSIS — Z0000 Encounter for general adult medical examination without abnormal findings: Secondary | ICD-10-CM

## 2018-11-11 NOTE — Progress Notes (Signed)
amb  

## 2018-11-11 NOTE — Telephone Encounter (Signed)
Referral placed.

## 2018-11-11 NOTE — Telephone Encounter (Signed)
Informed pt that if he has 100% coverage then he should not have to pay anything unless he hasnt met his deductible.  I will be sending the number to the different offices so he can schedule this.  He stated he is wanting it done by the end of the year. I told him that I would send a message to PCP to place referral just in case it is needed. Katharina Caper, April D, [place]

## 2018-11-11 NOTE — Telephone Encounter (Signed)
Pt is due for a colonoscopy and it is covered 100% he would like a referral to an office that is not going to make him pay up front or part of it and then wait for a refund  Check. jw

## 2018-11-16 ENCOUNTER — Ambulatory Visit (INDEPENDENT_AMBULATORY_CARE_PROVIDER_SITE_OTHER): Payer: Medicare Other | Admitting: Student in an Organized Health Care Education/Training Program

## 2018-11-16 ENCOUNTER — Encounter: Payer: Self-pay | Admitting: Student in an Organized Health Care Education/Training Program

## 2018-11-16 ENCOUNTER — Other Ambulatory Visit: Payer: Self-pay

## 2018-11-16 VITALS — BP 126/72 | HR 95 | Temp 98.1°F | Ht 68.0 in | Wt 254.8 lb

## 2018-11-16 DIAGNOSIS — G8929 Other chronic pain: Secondary | ICD-10-CM | POA: Diagnosis not present

## 2018-11-16 DIAGNOSIS — E785 Hyperlipidemia, unspecified: Secondary | ICD-10-CM | POA: Diagnosis not present

## 2018-11-16 DIAGNOSIS — J029 Acute pharyngitis, unspecified: Secondary | ICD-10-CM | POA: Diagnosis not present

## 2018-11-16 DIAGNOSIS — M545 Low back pain, unspecified: Secondary | ICD-10-CM

## 2018-11-16 DIAGNOSIS — G894 Chronic pain syndrome: Secondary | ICD-10-CM | POA: Diagnosis not present

## 2018-11-16 DIAGNOSIS — Z23 Encounter for immunization: Secondary | ICD-10-CM

## 2018-11-16 MED ORDER — GABAPENTIN 100 MG PO CAPS
100.0000 mg | ORAL_CAPSULE | Freq: Every evening | ORAL | 0 refills | Status: DC | PRN
Start: 2018-11-16 — End: 2019-01-18

## 2018-11-16 MED ORDER — ATORVASTATIN CALCIUM 80 MG PO TABS: 80.0000 mg | ORAL_TABLET | Freq: Every day | ORAL | 3 refills | Status: DC

## 2018-11-16 MED ORDER — HYDROCODONE-ACETAMINOPHEN 5-325 MG PO TABS: 1.0000 | ORAL_TABLET | Freq: Three times a day (TID) | ORAL | 0 refills | Status: DC | PRN

## 2018-11-16 NOTE — Patient Instructions (Addendum)
It was a pleasure seeing you today in our clinic.  Here is the treatment plan we have discussed and agreed upon together:  Stop cymbalta.  Gabapentin 100 mg to take at bedtime was sent.   Our clinic's number is [phone]. Please call with questions or concerns about what we discussed today.  Be well, Dr. Burr Medico
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
## 2018-11-17 ENCOUNTER — Encounter: Payer: Self-pay | Admitting: Student in an Organized Health Care Education/Training Program

## 2018-11-18 ENCOUNTER — Encounter: Payer: Self-pay | Admitting: Student in an Organized Health Care Education/Training Program

## 2018-12-04 ENCOUNTER — Encounter (INDEPENDENT_AMBULATORY_CARE_PROVIDER_SITE_OTHER): Payer: Self-pay | Admitting: Orthopaedic Surgery

## 2018-12-07 ENCOUNTER — Other Ambulatory Visit (INDEPENDENT_AMBULATORY_CARE_PROVIDER_SITE_OTHER): Payer: Self-pay | Admitting: Orthopaedic Surgery

## 2018-12-07 MED ORDER — METHOCARBAMOL 500 MG PO TABS: 500.0000 mg | ORAL_TABLET | Freq: Four times a day (QID) | ORAL | 1 refills | Status: DC

## 2018-12-15 DIAGNOSIS — Z1211 Encounter for screening for malignant neoplasm of colon: Secondary | ICD-10-CM | POA: Diagnosis not present

## 2018-12-15 DIAGNOSIS — D124 Benign neoplasm of descending colon: Secondary | ICD-10-CM | POA: Diagnosis not present

## 2018-12-15 DIAGNOSIS — K635 Polyp of colon: Secondary | ICD-10-CM | POA: Diagnosis not present

## 2018-12-15 DIAGNOSIS — D128 Benign neoplasm of rectum: Secondary | ICD-10-CM | POA: Diagnosis not present

## 2018-12-15 DIAGNOSIS — K621 Rectal polyp: Secondary | ICD-10-CM | POA: Diagnosis not present

## 2018-12-15 DIAGNOSIS — D122 Benign neoplasm of ascending colon: Secondary | ICD-10-CM | POA: Diagnosis not present

## 2018-12-18 ENCOUNTER — Encounter: Payer: Self-pay | Admitting: Student in an Organized Health Care Education/Training Program

## 2019-01-12 ENCOUNTER — Encounter: Payer: Self-pay | Admitting: Student in an Organized Health Care Education/Training Program

## 2019-01-18 ENCOUNTER — Encounter: Payer: Self-pay | Admitting: Student in an Organized Health Care Education/Training Program

## 2019-01-18 ENCOUNTER — Other Ambulatory Visit: Payer: Self-pay

## 2019-01-18 ENCOUNTER — Ambulatory Visit (INDEPENDENT_AMBULATORY_CARE_PROVIDER_SITE_OTHER): Payer: Medicare Other | Admitting: Student in an Organized Health Care Education/Training Program

## 2019-01-18 DIAGNOSIS — K219 Gastro-esophageal reflux disease without esophagitis: Secondary | ICD-10-CM | POA: Diagnosis not present

## 2019-01-18 DIAGNOSIS — I10 Essential (primary) hypertension: Secondary | ICD-10-CM | POA: Diagnosis not present

## 2019-01-18 DIAGNOSIS — E785 Hyperlipidemia, unspecified: Secondary | ICD-10-CM

## 2019-01-18 DIAGNOSIS — G8929 Other chronic pain: Secondary | ICD-10-CM

## 2019-01-18 DIAGNOSIS — G894 Chronic pain syndrome: Secondary | ICD-10-CM | POA: Diagnosis not present

## 2019-01-18 DIAGNOSIS — M545 Low back pain: Secondary | ICD-10-CM

## 2019-01-18 MED ORDER — HYDROCODONE-ACETAMINOPHEN 5-325 MG PO TABS: 1.0000 | ORAL_TABLET | Freq: Three times a day (TID) | ORAL | 0 refills | Status: DC | PRN

## 2019-01-18 MED ORDER — ATORVASTATIN CALCIUM 80 MG PO TABS: 80.0000 mg | ORAL_TABLET | Freq: Every day | ORAL | 3 refills | Status: DC

## 2019-01-18 MED ORDER — LOSARTAN POTASSIUM 100 MG PO TABS: 100.0000 mg | ORAL_TABLET | Freq: Every day | ORAL | 3 refills | Status: DC

## 2019-01-18 MED ORDER — GABAPENTIN 100 MG PO CAPS: 100.0000 mg | ORAL_CAPSULE | Freq: Every evening | ORAL | 0 refills | Status: DC | PRN

## 2019-01-18 MED ORDER — AMLODIPINE BESYLATE 5 MG PO TABS: 5.0000 mg | ORAL_TABLET | Freq: Every day | ORAL | 2 refills | Status: DC

## 2019-01-18 MED ORDER — ESOMEPRAZOLE MAGNESIUM 40 MG PO CPDR: 40.0000 mg | DELAYED_RELEASE_CAPSULE | Freq: Every day | ORAL | 3 refills | Status: DC

## 2019-01-18 NOTE — Patient Instructions (Signed)
It was a pleasure seeing you today in our clinic. Today we discussed exercises for your shoulders.  Here is the treatment plan we have discussed and agreed upon together:  1. Walk your hand up the wall on each side 2. Straight arm raises with a 5 pound weight forward, to the side 3. Pendulum swings of the arm  A consult was placed to Dermatology at today's visit.  You will receive a call to schedule an appointment. If you do not receive a call within two weeks please call our office so we can place the consult again.  Please schedule a visit with Dr. Valentina Lucks for your pulmonary function testing.  Our clinic's number is [phone]. Please call with questions or concerns about what we discussed today.  Be well, Dr. Burr Medico

## 2019-01-18 NOTE — Progress Notes (Signed)
   CC: pain in all joints  HPI: George Leon is a 63 y.o. male with PMH significant for chronic back pain on narcotic pain management who presents to [facility] today with shoulder and knee pain of many months duration.   Shoulder pain - bilateral. Worse with lifting heavy objects. Does not wake him from sleep. He has no limited range of motion, but discomfort with lifting over his head. He does not exercise. He takes NSAIDs and his chronic opioids for pain. No known history of trauma. He has had orthopedic intervention in the right shoulder in which a part of the bone was "shaved off" in the distant past.   Chronic pain management chronic back pain pain is relatively well controlled on current narcotic management, gabapentin and NSAIDs. He expresses some interest in switching back to cymbalta from gabapentin in the future, but will stay with gabapentin for now. Have discussed using narcotics only "as needed" rather than around the clock, however patient expresses that he always needs them.  Skin tag noted over left neck. Previously he removed these himself, however this one is too large. No itching, pain, or bleeding associated with the skin tag. No local irritation  Review of Symptoms:  See HPI for ROS.   CC, SH/smoking status, and VS noted.  Objective: BP 140/84   Pulse 97   Temp 98.2 F (36.8 C) (Oral)   Ht 5\' 8"  (1.727 m)   Wt 115 kg   SpO2 97%   BMI 38.56 kg/m  GEN: NAD, alert, cooperative, and pleasant. RESPIRATORY: Comfortable work of breathing, speaks in full sentences CV: Regular rate noted, distal extremities well perfused and warm without edema GI: Soft, nondistended SKIN: warm and dry, no rashes or lesions NEURO: II-XII grossly intact MSK: Moves 4 extremities equally PSYCH: AAOx3, appropriate affect Shoulder, bilateral: TTP noted anteriorly in the bicipital groove bilaterally No evidence of bony deformity, asymmetry, or muscle atrophy;  No TTP at [facility] joint. Full active and  passive range of motion (180 flex Huel Cote /150Abd /90ER /70IR), Thumb to T12 without significant tenderness. Strength 5/5 throughout. No abnormal scapular function observed. Sensation intact. Peripheral pulses intact.  Special Tests:   - Crossarm test: positive   - Empty can: positive   - Hawkins: NEG   - Yergason's: NEG   - Speeds test: NEG   - Crank test: NEG  Assessment and plan:  1. Bilateral shoulder pain - precepted with the Sports med fellow. Seems to be a rotator cuff tendinopathy. Does not seem acute. Exercises provided. PT offered, patient elects to try exercises at home. Follow up in 4-6 weeks.  1. Walk your hand up the wall on each side 2. Straight arm raises with a 5 pound weight forward, to the side 3. Pendulum swings of the arm  2. Skin tag, left neck - patient has a skin tag on his left neck. Our dermatology procedure clinic will likely not want to remove a lesion in this area. Referral placed to dermatology at today's visit.  3. Chronic pain - refills x3 months  Everrett Coombe, MD,MS,  PGY3 01/18/2019 1:38 PM

## 2019-02-15 ENCOUNTER — Encounter: Payer: Self-pay | Admitting: Student in an Organized Health Care Education/Training Program

## 2019-02-16 ENCOUNTER — Other Ambulatory Visit: Payer: Self-pay | Admitting: Student in an Organized Health Care Education/Training Program

## 2019-02-16 DIAGNOSIS — M545 Low back pain, unspecified: Secondary | ICD-10-CM

## 2019-02-16 DIAGNOSIS — G8929 Other chronic pain: Secondary | ICD-10-CM

## 2019-02-16 MED ORDER — GABAPENTIN 100 MG PO CAPS: 100.0000 mg | ORAL_CAPSULE | Freq: Every evening | ORAL | 0 refills | Status: DC | PRN

## 2019-02-26 ENCOUNTER — Encounter (INDEPENDENT_AMBULATORY_CARE_PROVIDER_SITE_OTHER): Payer: Self-pay | Admitting: Orthopaedic Surgery

## 2019-03-01 ENCOUNTER — Other Ambulatory Visit (INDEPENDENT_AMBULATORY_CARE_PROVIDER_SITE_OTHER): Payer: Self-pay | Admitting: Orthopaedic Surgery

## 2019-03-01 MED ORDER — METHOCARBAMOL 500 MG PO TABS: 500.0000 mg | ORAL_TABLET | Freq: Four times a day (QID) | ORAL | 1 refills | Status: DC | PRN

## 2019-03-05 ENCOUNTER — Telehealth (INDEPENDENT_AMBULATORY_CARE_PROVIDER_SITE_OTHER): Payer: Self-pay

## 2019-03-05 NOTE — Telephone Encounter (Signed)
Called pt and he answered NO to all prescreen questions for COVID-19. Pt has an appt with CB Monday.

## 2019-03-08 ENCOUNTER — Ambulatory Visit (INDEPENDENT_AMBULATORY_CARE_PROVIDER_SITE_OTHER): Payer: Managed Care, Other (non HMO) | Admitting: Orthopaedic Surgery

## 2019-05-05 ENCOUNTER — Telehealth: Payer: Self-pay | Admitting: *Deleted

## 2019-05-05 DIAGNOSIS — I10 Essential (primary) hypertension: Secondary | ICD-10-CM

## 2019-05-05 NOTE — Telephone Encounter (Signed)
Losartan 100mg  is on backorder for un unknown amount of time.  Pharmacy contacted pt and he is willing to take four 20mg  tablets, but they will need a new script.   Will send to PCP.  Christen Bame, CMA

## 2019-05-06 MED ORDER — LOSARTAN POTASSIUM 25 MG PO TABS: 100.0000 mg | ORAL_TABLET | Freq: Every day | ORAL | 0 refills | Status: DC

## 2019-05-06 NOTE — Telephone Encounter (Signed)
  Sent in new rx for 25 mg tablets to take 4 daily.   Lucila Maine, DO PGY-3, [facility] 05/06/2019 8:47 AM

## 2019-05-13 ENCOUNTER — Telehealth: Payer: Self-pay

## 2019-05-13 NOTE — Telephone Encounter (Signed)
Pt called nurse line requesting pain medication refill. Pt stated he did not want to come in due to [place]. I scheduled him a virtual visit with PCP.

## 2019-05-14 ENCOUNTER — Telehealth (INDEPENDENT_AMBULATORY_CARE_PROVIDER_SITE_OTHER): Payer: BC Managed Care – PPO | Admitting: Student in an Organized Health Care Education/Training Program

## 2019-05-14 ENCOUNTER — Encounter: Payer: Self-pay | Admitting: Student in an Organized Health Care Education/Training Program

## 2019-05-14 ENCOUNTER — Other Ambulatory Visit: Payer: Self-pay

## 2019-05-14 DIAGNOSIS — M545 Low back pain: Secondary | ICD-10-CM | POA: Diagnosis not present

## 2019-05-14 DIAGNOSIS — G894 Chronic pain syndrome: Secondary | ICD-10-CM

## 2019-05-14 DIAGNOSIS — G8929 Other chronic pain: Secondary | ICD-10-CM | POA: Diagnosis not present

## 2019-05-14 MED ORDER — HYDROCODONE-ACETAMINOPHEN 5-325 MG PO TABS: 1.0000 | ORAL_TABLET | Freq: Three times a day (TID) | ORAL | 0 refills | Status: DC | PRN

## 2019-05-14 MED ORDER — DULOXETINE HCL 60 MG PO CPEP: 60.0000 mg | ORAL_CAPSULE | Freq: Every day | ORAL | 0 refills | Status: DC

## 2019-05-14 NOTE — Assessment & Plan Note (Signed)
Continue current management with norco #90 pills per month, sent electronically as patient is practicing social distancing. He will need a refill because we cannot send refills electronically. Next refill is 7/5, and then refill on 8/5, and after that he should come in for another visit for further refills and to meet his new PCP. We also transitioned from Gabapentin to cymbalta today to help manage his nerve pain symptoms.

## 2019-05-14 NOTE — Progress Notes (Signed)
[place] Telemedicine Visit  Patient consented to have virtual visit. Method of visit: Telephone  Encounter participants: Patient: George Leon - located at Home Provider: Everrett Coombe - located at [facility] Others (if applicable):   Chief Complaint: Chronic pain, needs refills  HPI:  Patient reports that he is taking Norco5-325 TID and he has been taking gabapentin. He does not feel that the gabapentin is helping him. He would like to go to 4x daily dosing of his Norco. He reports that pain is at a 9 between his doses of medication. When   ROS: per HPI  Pertinent PMHx: chronic pain, GERD, osteoarthritis of numerous joints, PVD, CAD  Exam:  Respiratory: speaks in full sentences over the phone  Assessment/Plan:  Chronic pain syndrome Continue current management with norco #90 pills per month, sent electronically as patient is practicing social distancing. He will need a refill because we cannot send refills electronically. Next refill is 7/5, and then refill on 8/5, and after that he should come in for another visit for further refills and to meet his new PCP. We also transitioned from Gabapentin to cymbalta today to help manage his nerve pain symptoms.    Time spent during visit with patient: 10 minutes

## 2019-06-17 ENCOUNTER — Other Ambulatory Visit: Payer: Self-pay | Admitting: Family Medicine

## 2019-06-17 DIAGNOSIS — G8929 Other chronic pain: Secondary | ICD-10-CM

## 2019-06-17 DIAGNOSIS — G894 Chronic pain syndrome: Secondary | ICD-10-CM

## 2019-06-17 MED ORDER — HYDROCODONE-ACETAMINOPHEN 5-325 MG PO TABS: 1.0000 | ORAL_TABLET | Freq: Three times a day (TID) | ORAL | 0 refills | Status: DC | PRN

## 2019-06-17 NOTE — Progress Notes (Signed)
Rx sent for Vicodin 90 tabs.  Last seen by Dr. Burr Medico 6/5.  PMP reviewed, last refill 6/5.  Can refill on 8/5 again, then would need appointment.

## 2019-06-17 NOTE — Telephone Encounter (Signed)
Pt informed of below.George Leon, CMA ? ?

## 2019-06-17 NOTE — Telephone Encounter (Signed)
Rx sent.  Please inform patient

## 2019-06-17 NOTE — Telephone Encounter (Signed)
Pt called to get his medication VICODIN, Please give pt a call back.

## 2019-07-16 ENCOUNTER — Other Ambulatory Visit: Payer: Self-pay | Admitting: Family Medicine

## 2019-07-16 ENCOUNTER — Encounter: Payer: Self-pay | Admitting: Family Medicine

## 2019-07-16 DIAGNOSIS — G8929 Other chronic pain: Secondary | ICD-10-CM

## 2019-07-16 DIAGNOSIS — G894 Chronic pain syndrome: Secondary | ICD-10-CM

## 2019-07-16 DIAGNOSIS — M545 Low back pain, unspecified: Secondary | ICD-10-CM

## 2019-07-16 MED ORDER — HYDROCODONE-ACETAMINOPHEN 5-325 MG PO TABS: 1.0000 | ORAL_TABLET | Freq: Three times a day (TID) | ORAL | 0 refills | Status: DC | PRN

## 2019-07-16 NOTE — Progress Notes (Signed)
Visit from 6/5.  PMP reviewed without red flags.  Refill sent.  Patient will need appointment for next refill.

## 2019-08-06 ENCOUNTER — Encounter: Payer: Self-pay | Admitting: Family Medicine

## 2019-08-06 DIAGNOSIS — R21 Rash and other nonspecific skin eruption: Secondary | ICD-10-CM

## 2019-08-06 MED ORDER — NYSTATIN 100000 UNIT/GM EX CREA: TOPICAL_CREAM | Freq: Two times a day (BID) | CUTANEOUS | 1 refills | Status: DC

## 2019-08-08 ENCOUNTER — Other Ambulatory Visit: Payer: Self-pay | Admitting: Student in an Organized Health Care Education/Training Program

## 2019-08-08 DIAGNOSIS — G8929 Other chronic pain: Secondary | ICD-10-CM

## 2019-08-08 DIAGNOSIS — G894 Chronic pain syndrome: Secondary | ICD-10-CM

## 2019-08-13 ENCOUNTER — Encounter: Payer: Self-pay | Admitting: Family Medicine

## 2019-08-13 ENCOUNTER — Other Ambulatory Visit: Payer: Self-pay | Admitting: Family Medicine

## 2019-08-13 DIAGNOSIS — J449 Chronic obstructive pulmonary disease, unspecified: Secondary | ICD-10-CM

## 2019-08-13 MED ORDER — ALBUTEROL SULFATE HFA 108 (90 BASE) MCG/ACT IN AERS: 1.0000 | INHALATION_SPRAY | RESPIRATORY_TRACT | 1 refills | Status: DC | PRN

## 2019-08-13 NOTE — Progress Notes (Signed)
Patient requesting albuterol refill

## 2019-08-17 ENCOUNTER — Telehealth (INDEPENDENT_AMBULATORY_CARE_PROVIDER_SITE_OTHER): Payer: BC Managed Care – PPO | Admitting: Family Medicine

## 2019-08-17 ENCOUNTER — Other Ambulatory Visit: Payer: Self-pay

## 2019-08-17 ENCOUNTER — Encounter: Payer: Self-pay | Admitting: Family Medicine

## 2019-08-17 DIAGNOSIS — M545 Low back pain, unspecified: Secondary | ICD-10-CM

## 2019-08-17 DIAGNOSIS — R238 Other skin changes: Secondary | ICD-10-CM | POA: Insufficient documentation

## 2019-08-17 DIAGNOSIS — G8929 Other chronic pain: Secondary | ICD-10-CM | POA: Diagnosis not present

## 2019-08-17 DIAGNOSIS — G894 Chronic pain syndrome: Secondary | ICD-10-CM

## 2019-08-17 DIAGNOSIS — I10 Essential (primary) hypertension: Secondary | ICD-10-CM

## 2019-08-17 MED ORDER — SELENIUM SULFIDE 2.5 % EX LOTN: 1.0000 "application " | TOPICAL_LOTION | Freq: Every day | CUTANEOUS | 1 refills | Status: DC | PRN

## 2019-08-17 MED ORDER — LOSARTAN POTASSIUM 100 MG PO TABS: 100.0000 mg | ORAL_TABLET | Freq: Every day | ORAL | 3 refills | Status: DC

## 2019-08-17 MED ORDER — HYDROCODONE-ACETAMINOPHEN 5-325 MG PO TABS: 1.0000 | ORAL_TABLET | Freq: Four times a day (QID) | ORAL | 0 refills | Status: DC | PRN

## 2019-08-17 NOTE — Assessment & Plan Note (Signed)
Selenium sulfide QD PRN for flakes and irritation

## 2019-08-17 NOTE — Progress Notes (Signed)
[facility] Telemedicine Visit  Patient consented to have virtual visit. Method of visit: Telephone  Encounter participants: Patient: George Leon - located at home Provider: Cleophas Dunker - located at [facility] Others (if applicable): none  Chief Complaint: medication refill  HPI:  Chronic Pain Has been having increased pain due to increased activity the last month.  Would like to increase norco to QID for one month and then plans to decrease to TID after that, as he suspects that he will not need to be as active after next month.  Using cymbalta with decent control of neuropathic pain.  Does note intermittent burning in his right great toe, but has used gabapentin 100mg  QHS recently without improvement and he would like to hold off on more medication at this time.    Dry and irritated scalp Notes that he has had dry, itchy scalp that has not improved with OTC dandruff shampoo.  States, "I don't think it's dandruff, because it's all flakes of the same size."    HTN Requesting refill on losartan.  Notes that BP has been well controlled at home.  Denies chest pain, headaches.  ROS: per HPI  Pertinent PMHx: Chronic pain, HTN, CAD, HLD  Exam:  Respiratory: speaking in complete sentences, no evidence of respiratory distress over the phone  Assessment/Plan:  Chronic pain syndrome PMP reviewed without red flags.  Has previously been on 240 tabs per month.  Given increased pain and activity, will increase to 120 tabs per month.  On 10/8, plan to decrease to 90 tabs per month again.  Okay to refill on 10/9 and 11/9, then will need visit.  Patient planning for inpatient visit in 1 month.  Scalp irritation Selenium sulfide QD PRN for flakes and irritation  HYPERTENSION, BENIGN Rx sent for losartan.  Will need in-person visit in 1 month and labs at that time.    Time spent during visit with patient: 13 minutes

## 2019-08-17 NOTE — Assessment & Plan Note (Signed)
PMP reviewed without red flags.  Has previously been on 240 tabs per month.  Given increased pain and activity, will increase to 120 tabs per month.  On 10/8, plan to decrease to 90 tabs per month again.  Okay to refill on 10/9 and 11/9, then will need visit.  Patient planning for inpatient visit in 1 month.

## 2019-08-17 NOTE — Assessment & Plan Note (Signed)
Rx sent for losartan.  Will need in-person visit in 1 month and labs at that time.

## 2019-08-18 ENCOUNTER — Other Ambulatory Visit: Payer: Self-pay

## 2019-08-20 ENCOUNTER — Other Ambulatory Visit: Payer: Self-pay | Admitting: *Deleted

## 2019-08-20 DIAGNOSIS — I10 Essential (primary) hypertension: Secondary | ICD-10-CM

## 2019-09-03 ENCOUNTER — Encounter: Payer: Self-pay | Admitting: Family Medicine

## 2019-09-03 DIAGNOSIS — K219 Gastro-esophageal reflux disease without esophagitis: Secondary | ICD-10-CM

## 2019-09-03 DIAGNOSIS — G894 Chronic pain syndrome: Secondary | ICD-10-CM

## 2019-09-03 DIAGNOSIS — E785 Hyperlipidemia, unspecified: Secondary | ICD-10-CM

## 2019-09-03 DIAGNOSIS — G8929 Other chronic pain: Secondary | ICD-10-CM

## 2019-09-03 DIAGNOSIS — I10 Essential (primary) hypertension: Secondary | ICD-10-CM

## 2019-09-06 MED ORDER — LOSARTAN POTASSIUM 100 MG PO TABS: 100.0000 mg | ORAL_TABLET | Freq: Every day | ORAL | 3 refills | Status: DC

## 2019-09-06 MED ORDER — AMLODIPINE BESYLATE 5 MG PO TABS: 5.0000 mg | ORAL_TABLET | Freq: Every day | ORAL | 3 refills | Status: DC

## 2019-09-06 MED ORDER — ESOMEPRAZOLE MAGNESIUM 40 MG PO CPDR: 40.0000 mg | DELAYED_RELEASE_CAPSULE | Freq: Every day | ORAL | 3 refills | Status: DC

## 2019-09-06 MED ORDER — DULOXETINE HCL 60 MG PO CPEP: ORAL_CAPSULE | ORAL | 3 refills | Status: DC

## 2019-09-06 MED ORDER — ATORVASTATIN CALCIUM 80 MG PO TABS: 80.0000 mg | ORAL_TABLET | Freq: Every day | ORAL | 3 refills | Status: DC

## 2019-09-14 ENCOUNTER — Encounter: Payer: Self-pay | Admitting: Family Medicine

## 2019-09-15 ENCOUNTER — Other Ambulatory Visit: Payer: Self-pay | Admitting: Family Medicine

## 2019-09-15 DIAGNOSIS — G8929 Other chronic pain: Secondary | ICD-10-CM

## 2019-09-15 DIAGNOSIS — G894 Chronic pain syndrome: Secondary | ICD-10-CM

## 2019-09-15 MED ORDER — HYDROCODONE-ACETAMINOPHEN 5-325 MG PO TABS: 1.0000 | ORAL_TABLET | Freq: Three times a day (TID) | ORAL | 0 refills | Status: DC | PRN

## 2019-09-15 NOTE — Progress Notes (Signed)
Refill requested by patient.  Per prior agreement, have decreased to 90 tabs again.  PMP reviewed, no red flags.

## 2019-10-14 ENCOUNTER — Encounter: Payer: Self-pay | Admitting: Family Medicine

## 2019-10-14 DIAGNOSIS — G8929 Other chronic pain: Secondary | ICD-10-CM

## 2019-10-14 DIAGNOSIS — M545 Low back pain, unspecified: Secondary | ICD-10-CM

## 2019-10-14 DIAGNOSIS — G894 Chronic pain syndrome: Secondary | ICD-10-CM

## 2019-10-15 MED ORDER — HYDROCODONE-ACETAMINOPHEN 5-325 MG PO TABS: 1.0000 | ORAL_TABLET | Freq: Three times a day (TID) | ORAL | 0 refills | Status: DC | PRN

## 2019-11-12 ENCOUNTER — Encounter: Payer: Self-pay | Admitting: Family Medicine

## 2019-11-12 ENCOUNTER — Other Ambulatory Visit: Payer: Self-pay

## 2019-11-12 DIAGNOSIS — M545 Low back pain, unspecified: Secondary | ICD-10-CM

## 2019-11-12 DIAGNOSIS — G894 Chronic pain syndrome: Secondary | ICD-10-CM

## 2019-11-14 MED ORDER — HYDROCODONE-ACETAMINOPHEN 5-325 MG PO TABS: 1.0000 | ORAL_TABLET | Freq: Three times a day (TID) | ORAL | 0 refills | Status: DC | PRN

## 2019-11-14 NOTE — Telephone Encounter (Signed)
Rx filled to get patient to appointment on 12/14

## 2019-11-22 ENCOUNTER — Encounter: Payer: Self-pay | Admitting: Family Medicine

## 2019-11-22 ENCOUNTER — Ambulatory Visit (INDEPENDENT_AMBULATORY_CARE_PROVIDER_SITE_OTHER): Payer: BC Managed Care – PPO | Admitting: Family Medicine

## 2019-11-22 ENCOUNTER — Other Ambulatory Visit: Payer: Self-pay

## 2019-11-22 VITALS — BP 128/75 | HR 75

## 2019-11-22 DIAGNOSIS — J439 Emphysema, unspecified: Secondary | ICD-10-CM

## 2019-11-22 DIAGNOSIS — Z23 Encounter for immunization: Secondary | ICD-10-CM

## 2019-11-22 DIAGNOSIS — G8929 Other chronic pain: Secondary | ICD-10-CM

## 2019-11-22 DIAGNOSIS — M545 Low back pain, unspecified: Secondary | ICD-10-CM

## 2019-11-22 DIAGNOSIS — E785 Hyperlipidemia, unspecified: Secondary | ICD-10-CM

## 2019-11-22 DIAGNOSIS — G894 Chronic pain syndrome: Secondary | ICD-10-CM

## 2019-11-22 MED ORDER — UMECLIDINIUM BROMIDE 62.5 MCG/INH IN AEPB: 1.0000 | INHALATION_SPRAY | Freq: Every day | RESPIRATORY_TRACT | 3 refills | Status: DC

## 2019-11-22 MED ORDER — HYDROCODONE-ACETAMINOPHEN 5-325 MG PO TABS: 1.0000 | ORAL_TABLET | Freq: Three times a day (TID) | ORAL | 0 refills | Status: DC | PRN

## 2019-11-22 MED ORDER — ZOSTER VAC RECOMB ADJUVANTED 50 MCG/0.5ML IM SUSR: 0.5000 mL | Freq: Once | INTRAMUSCULAR | 0 refills | Status: AC

## 2019-11-22 NOTE — Assessment & Plan Note (Signed)
Will order lipid panel and BMP.  Patient cannot stay for labs today, plans to come back.  Future orders placed.  Currently on lipitor 80mg  QD

## 2019-11-22 NOTE — Assessment & Plan Note (Signed)
PDMP reviewed without red flags.  Will send refill for the remainder of this month.  Refills will be provided for the next 3 months.  Discussed with patient, given that he has not had any red flags and has been on this medication for a long time, still significantly decreased from previously being on 240 tabs a month, will increase his prescription for the next 3 months to 120 pills a month.

## 2019-11-22 NOTE — Progress Notes (Signed)
Subjective: Chief Complaint  Patient presents with  . Medication Management     HPI: George Leon is a 63 y.o. presenting to clinic today to discuss the following:  Chronic Low Back Pain Notes that pain has not been fully controlled since decreasing to 90 tabs per month from 120.  Reports that with the weather getting colder his pain gets worse and he would like to have refills for 120 tabs for the next three months.  Reports that after it gets warmer, he would be opening to try decreasing to 90 tabs again.  Also uses cymbalta with good relief.  Recently sent Rx for half a month to get patient through to this appointment.  Chronic nerve pain Patient has had previous surgeries on his neck.  Notes that over the last year or so, he has had shooting numbness and tingling into his right arm.  States that he is just letting this provider know about it, he is planning on likely going back to Dr. Ninfa Linden.  COPD Previously spoke with patient about starting LAMA therapy, at that time was only interested in albuterol as needed.  Patient states that he continues to have occasional difficulty breathing and thinks that he would benefit from a daily medication that could help control this.  Notes that he is not having any difficulty breathing at present.  Has previously seen Dr. Vaughan Browner, could not afford Trelegy as was prescribed, therefore has not been taking anything.  Notes that this was a long time ago.  HLD Currently on Lipitor 80mg .  Last Lipid Panel 2017.  Reports that he has not had any chest pain or difficulty breathing outside of COPD.  Health Maintenance: Needs shingles vaccination, flu shot today     ROS noted in HPI. Chief complaint noted.  Other Pertinent PMH: Hypertension, PVD, diastolic CHF, CAD, pulmonary hypertension, COPD Past Medical, Surgical, Social, and Family History Reviewed & Updated per EMR.      Social History   Tobacco Use  Smoking Status Current Every Day  Smoker  . Packs/day: 1.00  . Years: 45.00  . Pack years: 45.00  . Types: Cigarettes  Smokeless Tobacco Former Systems developer  . Types: Chew   Smoking status noted.    Objective: BP 128/75   Pulse 75   SpO2 98%  Vitals and nursing notes reviewed  Physical Exam:  General: 63 y.o. male in NAD Cardio: RRR no m/r/g Lungs: CTAB, no wheezing, no rhonchi, no crackles, no IWOB on RA Skin: warm and dry Extremities: Moves all 4 extremities equally   No results found for this or any previous visit (from the past 72 hour(s)).  Assessment/Plan:  Chronic back pain PDMP reviewed without red flags.  Will send refill for the remainder of this month.  Refills will be provided for the next 3 months.  Discussed with patient, given that he has not had any red flags and has been on this medication for a long time, still significantly decreased from previously being on 240 tabs a month, will increase his prescription for the next 3 months to 120 pills a month.  Chronic obstructive pulmonary disease (HCC) Given that patient has not been on a controller, will restart LAMA, Rx sent for Incruse Ellipta.  Hyperlipidemia Will order lipid panel and BMP.  Patient cannot stay for labs today, plans to come back.  Future orders placed.  Currently on lipitor 80mg  QD  To follow up with Dr. Ninfa Linden for chronic discogenic neck pain.  Plans to talk about his prostate at the next visit.   PATIENT EDUCATION PROVIDED: See AVS    Diagnosis and plan along with any newly prescribed medication(s) were discussed in detail with this patient today. The patient verbalized understanding and agreed with the plan. Patient advised if symptoms worsen return to clinic or ER.   Health Maintainance: Rx sent for shingles vaccination, flu shot given today.   Orders Placed This Encounter  Procedures  . Flu Vaccine QUAD 36+ mos IM  . Lipid panel    Standing Status:   Future    Standing Expiration Date:   11/21/2020    Order  Specific Question:   Has the patient fasted?    Answer:   No  . Basic Metabolic Panel    Standing Status:   Future    Standing Expiration Date:   11/21/2020    Order Specific Question:   Has the patient fasted?    Answer:   No    Meds ordered this encounter  Medications  . umeclidinium bromide (INCRUSE ELLIPTA) 62.5 MCG/INH AEPB    Sig: Inhale 1 puff into the lungs daily.    Dispense:  7 each    Refill:  3  . HYDROcodone-acetaminophen (NORCO/VICODIN) 5-325 MG tablet    Sig: Take 1 tablet by mouth every 8 (eight) hours as needed for moderate pain or severe pain.    Dispense:  64 tablet    Refill:  0    Refills to be given spaced one month apart, no sooner than 06/13/2019 and 07/14/2019  . Zoster Vaccine Adjuvanted [facility]) injection    Sig: Inject 0.5 mLs into the muscle once for 1 dose.    Dispense:  0.5 mL    Refill:  0     Arizona Constable, DO 11/22/2019, 4:45 PM PGY-2 [place]

## 2019-11-22 NOTE — Patient Instructions (Signed)
Thank you for coming to see me today. It was a pleasure. Today we talked about:   Your pain: I will send your prescription to your new pharmacy.  Your breathing: I will send your new inhaler that you should take daily.  I will order labs and come back at your convenience to do them.  Call Dr. Ninfa Linden about your neck.  Let me know when you want to talk about your prostate or a urology referral.  If you have any questions or concerns, please do not hesitate to call the office at [phone].  Best,   Arizona Constable, DO

## 2019-11-22 NOTE — Assessment & Plan Note (Signed)
Given that patient has not been on a controller, will restart LAMA, Rx sent for Incruse Ellipta.

## 2019-11-24 ENCOUNTER — Other Ambulatory Visit: Payer: Self-pay | Admitting: Family Medicine

## 2019-11-24 NOTE — Progress Notes (Signed)
Created in error

## 2019-12-09 ENCOUNTER — Encounter: Payer: Self-pay | Admitting: Family Medicine

## 2019-12-13 ENCOUNTER — Other Ambulatory Visit: Payer: Self-pay | Admitting: Family Medicine

## 2019-12-13 ENCOUNTER — Telehealth: Payer: Self-pay | Admitting: Family Medicine

## 2019-12-13 DIAGNOSIS — G894 Chronic pain syndrome: Secondary | ICD-10-CM

## 2019-12-13 DIAGNOSIS — M545 Low back pain, unspecified: Secondary | ICD-10-CM

## 2019-12-13 DIAGNOSIS — G8929 Other chronic pain: Secondary | ICD-10-CM

## 2019-12-13 DIAGNOSIS — R21 Rash and other nonspecific skin eruption: Secondary | ICD-10-CM

## 2019-12-13 MED ORDER — NYSTATIN 100000 UNIT/GM EX CREA: TOPICAL_CREAM | Freq: Two times a day (BID) | CUTANEOUS | 1 refills | Status: DC

## 2019-12-13 MED ORDER — HYDROCODONE-ACETAMINOPHEN 5-325 MG PO TABS: 1.0000 | ORAL_TABLET | Freq: Four times a day (QID) | ORAL | 0 refills | Status: DC | PRN

## 2019-12-13 NOTE — Progress Notes (Addendum)
Rx request per patient.  PMP reviewed.  Patient's last RX was not for full month to get him back on early month refill schedule.  Sent for 120 per last visit discussion on 12/14.  Nystatin also refilled at patient request

## 2019-12-13 NOTE — Addendum Note (Signed)
Addended by: Cleophas Dunker on: 12/13/2019 12:54 PM   Modules accepted: Orders

## 2019-12-14 NOTE — Telephone Encounter (Signed)
Completed yesterday

## 2020-01-09 ENCOUNTER — Encounter: Payer: Self-pay | Admitting: Family Medicine

## 2020-01-12 ENCOUNTER — Other Ambulatory Visit: Payer: Self-pay | Admitting: Family Medicine

## 2020-01-12 DIAGNOSIS — G894 Chronic pain syndrome: Secondary | ICD-10-CM

## 2020-01-12 DIAGNOSIS — G8929 Other chronic pain: Secondary | ICD-10-CM

## 2020-01-12 MED ORDER — HYDROCODONE-ACETAMINOPHEN 5-325 MG PO TABS: 1.0000 | ORAL_TABLET | Freq: Four times a day (QID) | ORAL | 0 refills | Status: DC | PRN

## 2020-01-12 NOTE — Progress Notes (Signed)
Rx filled per patient request for 120 tabets as agreed upon at visit. PMP reviewed, no red flags.  Needs appointment for next refill.  Patient aware.

## 2020-01-13 ENCOUNTER — Telehealth: Payer: Self-pay | Admitting: *Deleted

## 2020-01-13 ENCOUNTER — Other Ambulatory Visit: Payer: Self-pay | Admitting: Family Medicine

## 2020-01-13 DIAGNOSIS — J439 Emphysema, unspecified: Secondary | ICD-10-CM

## 2020-01-13 MED ORDER — UMECLIDINIUM BROMIDE 62.5 MCG/INH IN AEPB: 1.0000 | INHALATION_SPRAY | Freq: Every day | RESPIRATORY_TRACT | 3 refills | Status: DC

## 2020-01-13 NOTE — Telephone Encounter (Signed)
Rx sent to [facility]

## 2020-01-13 NOTE — Telephone Encounter (Signed)
Received fax from [place] requesting a 90 day supply for pts INCRUSE.  Will place fax in your box for reference.  Nataleigh Griffin Zimmerman Rumple, CMA

## 2020-02-04 ENCOUNTER — Encounter: Payer: Self-pay | Admitting: Family Medicine

## 2020-02-07 ENCOUNTER — Other Ambulatory Visit: Payer: Self-pay | Admitting: Family Medicine

## 2020-02-07 DIAGNOSIS — G8929 Other chronic pain: Secondary | ICD-10-CM

## 2020-02-07 DIAGNOSIS — G894 Chronic pain syndrome: Secondary | ICD-10-CM

## 2020-02-07 MED ORDER — HYDROCODONE-ACETAMINOPHEN 5-325 MG PO TABS: 1.0000 | ORAL_TABLET | Freq: Four times a day (QID) | ORAL | 0 refills | Status: DC | PRN

## 2020-02-07 NOTE — Progress Notes (Signed)
Rx sent for 7 days given patient's earliest possible appointment March 10th.  Has always been appropriate with refills, therefore sent enough to get him through until appointment.  PMP reviewed, without red flags.

## 2020-02-09 ENCOUNTER — Encounter: Payer: Self-pay | Admitting: Family Medicine

## 2020-02-16 ENCOUNTER — Ambulatory Visit (INDEPENDENT_AMBULATORY_CARE_PROVIDER_SITE_OTHER): Payer: BC Managed Care – PPO | Admitting: Family Medicine

## 2020-02-16 ENCOUNTER — Other Ambulatory Visit: Payer: Self-pay

## 2020-02-16 ENCOUNTER — Encounter: Payer: Self-pay | Admitting: Family Medicine

## 2020-02-16 VITALS — BP 108/70 | HR 90 | Wt 253.6 lb

## 2020-02-16 DIAGNOSIS — E785 Hyperlipidemia, unspecified: Secondary | ICD-10-CM

## 2020-02-16 DIAGNOSIS — G8929 Other chronic pain: Secondary | ICD-10-CM

## 2020-02-16 DIAGNOSIS — G894 Chronic pain syndrome: Secondary | ICD-10-CM | POA: Diagnosis not present

## 2020-02-16 DIAGNOSIS — M545 Low back pain, unspecified: Secondary | ICD-10-CM

## 2020-02-16 DIAGNOSIS — I10 Essential (primary) hypertension: Secondary | ICD-10-CM

## 2020-02-16 MED ORDER — HYDROCODONE-ACETAMINOPHEN 5-325 MG PO TABS: 1.0000 | ORAL_TABLET | Freq: Four times a day (QID) | ORAL | 0 refills | Status: DC | PRN

## 2020-02-16 NOTE — Assessment & Plan Note (Signed)
Tolerating Lipitor well.  We will continue.  Lipid panel today.  Encouraged healthy diet.

## 2020-02-16 NOTE — Assessment & Plan Note (Signed)
PDMP reviewed without red flags.  Refill sent for the remainder of this month, then will restart patient on refills on the fourth of the month as this has been his usual timing.  We will continue patient at 120 tablets, hopeful that eventually we can continue to wean.  Overall, I think that we are making good progress given that previously he had been on 240 tablets a month.  Okay for refills for the next 3 months.  Patient aware that he will need an appointment in 3 months for follow-up.  Will need to do UDS at next visit as it appears he has not had one since 2017.

## 2020-02-16 NOTE — Progress Notes (Signed)
    SUBJECTIVE:   CHIEF COMPLAINT / HPI:   Chronic Back Pain Patient has a history of chronic pain and at last visit on 11/22/2019 increased from 90 tabs from 120 tabs per month given worsening with cold weather.  Also uses Cymbalta with good relief.  Reports that he would like to continue staying at 120 tablets per month at this time as he is just now starting to have good control of his pain.  He is not interested in decreasing at this time, but states he may be in the future.  Hyperlipidemia Has been on Lipitor 80 mg daily.  Last lipid panel was in 2017.  Patient has previously been ordered a lipid panel, but not been completed yet.  Tolerates his Lipitor daily.  HTN Tolerating losartan.  BP today well controlled.  Needs a BMP.  PERTINENT  PMH / PSH: Chronic pain, hypertension, CAD, HLD  OBJECTIVE:   BP 108/70   Pulse 90   Wt 253 lb 9.6 oz (115 kg)   SpO2 94%   BMI 38.56 kg/m    Physical Exam:  General: 64 y.o. male in NAD Lungs: breathing comfortably on RA Abdomen: Soft, non-tender to palpation, non-distended, positive bowel sounds Skin: warm and dry Extremities: ambulated with cane   ASSESSMENT/PLAN:   Chronic back pain PDMP reviewed without red flags.  Refill sent for the remainder of this month, then will restart patient on refills on the fourth of the month as this has been his usual timing.  We will continue patient at 120 tablets, hopeful that eventually we can continue to wean.  Overall, I think that we are making good progress given that previously he had been on 240 tablets a month.  Okay for refills for the next 3 months.  Patient aware that he will need an appointment in 3 months for follow-up.  Will need to do UDS at next visit as it appears he has not had one since 2017.  Hyperlipidemia Tolerating Lipitor well.  We will continue.  Lipid panel today.  Encouraged healthy diet.  HYPERTENSION, BENIGN BP well controlled today.  Continue losartan.  BMP today.       Cleophas Dunker, [place]

## 2020-02-16 NOTE — Patient Instructions (Signed)
Thank you for coming to see me today. It was a pleasure. Today we talked about:   I'm glad your breathing is doing better.  We will get labs today and contact you with results.  Make sure you come back in 3 months so we can keep refilling your pain medication.  Please follow-up with me in 3 months.  If you have any questions or concerns, please do not hesitate to call the office at [phone].  Best,   Arizona Constable, DO

## 2020-02-16 NOTE — Assessment & Plan Note (Signed)
BP well controlled today.  Continue losartan.  BMP today.

## 2020-02-17 LAB — BASIC METABOLIC PANEL
BUN/Creatinine Ratio: 9 — ABNORMAL LOW (ref 10–24)
BUN: 7 mg/dL — ABNORMAL LOW (ref 8–27)
CO2: 22 mmol/L (ref 20–29)
Calcium: 9 mg/dL (ref 8.6–10.2)
Chloride: 101 mmol/L (ref 96–106)
Creatinine, Ser: 0.74 mg/dL — ABNORMAL LOW (ref 0.76–1.27)
GFR calc Af Amer: 114 mL/min/{1.73_m2} (ref >59)
GFR calc non Af Amer: 99 mL/min/{1.73_m2} (ref >59)
Glucose: 84 mg/dL (ref 65–99)
Potassium: 4.2 mmol/L (ref 3.5–5.2)
Sodium: 137 mmol/L (ref 134–144)

## 2020-02-17 LAB — LIPID PANEL
Chol/HDL Ratio: 3.6 ratio (ref 0.0–5.0)
Cholesterol, Total: 128 mg/dL (ref 100–199)
HDL: 36 mg/dL — ABNORMAL LOW (ref >39)
LDL Chol Calc (NIH): 72 mg/dL (ref 0–99)
Triglycerides: 107 mg/dL (ref 0–149)
VLDL Cholesterol Cal: 20 mg/dL (ref 5–40)

## 2020-03-09 ENCOUNTER — Encounter: Payer: Self-pay | Admitting: Family Medicine

## 2020-03-09 DIAGNOSIS — G8929 Other chronic pain: Secondary | ICD-10-CM

## 2020-03-09 DIAGNOSIS — G894 Chronic pain syndrome: Secondary | ICD-10-CM

## 2020-03-10 MED ORDER — HYDROCODONE-ACETAMINOPHEN 5-325 MG PO TABS: 1.0000 | ORAL_TABLET | Freq: Four times a day (QID) | ORAL | 0 refills | Status: DC | PRN

## 2020-04-06 ENCOUNTER — Other Ambulatory Visit: Payer: Self-pay | Admitting: Family Medicine

## 2020-04-06 ENCOUNTER — Encounter: Payer: Self-pay | Admitting: Family Medicine

## 2020-04-06 DIAGNOSIS — G894 Chronic pain syndrome: Secondary | ICD-10-CM

## 2020-04-06 DIAGNOSIS — G8929 Other chronic pain: Secondary | ICD-10-CM

## 2020-04-06 DIAGNOSIS — M545 Low back pain, unspecified: Secondary | ICD-10-CM

## 2020-04-06 MED ORDER — HYDROCODONE-ACETAMINOPHEN 5-325 MG PO TABS: 1.0000 | ORAL_TABLET | Freq: Four times a day (QID) | ORAL | 0 refills | Status: DC | PRN

## 2020-04-06 NOTE — Progress Notes (Signed)
PMP reviewed, no red flags. 

## 2020-05-02 ENCOUNTER — Encounter: Payer: Self-pay | Admitting: Family Medicine

## 2020-05-04 ENCOUNTER — Encounter: Payer: Self-pay | Admitting: Family Medicine

## 2020-05-07 ENCOUNTER — Other Ambulatory Visit: Payer: Self-pay | Admitting: Family Medicine

## 2020-05-07 DIAGNOSIS — J439 Emphysema, unspecified: Secondary | ICD-10-CM

## 2020-05-11 ENCOUNTER — Encounter: Payer: Self-pay | Admitting: Family Medicine

## 2020-05-11 ENCOUNTER — Ambulatory Visit (INDEPENDENT_AMBULATORY_CARE_PROVIDER_SITE_OTHER): Payer: BC Managed Care – PPO | Admitting: Family Medicine

## 2020-05-11 ENCOUNTER — Other Ambulatory Visit: Payer: Self-pay

## 2020-05-11 VITALS — BP 130/68 | HR 99 | Ht 68.0 in | Wt 258.4 lb

## 2020-05-11 DIAGNOSIS — M79672 Pain in left foot: Secondary | ICD-10-CM

## 2020-05-11 DIAGNOSIS — M545 Low back pain, unspecified: Secondary | ICD-10-CM

## 2020-05-11 DIAGNOSIS — G8929 Other chronic pain: Secondary | ICD-10-CM

## 2020-05-11 DIAGNOSIS — M79671 Pain in right foot: Secondary | ICD-10-CM | POA: Insufficient documentation

## 2020-05-11 DIAGNOSIS — G894 Chronic pain syndrome: Secondary | ICD-10-CM | POA: Diagnosis not present

## 2020-05-11 MED ORDER — OXYCODONE-ACETAMINOPHEN 2.5-325 MG PO TABS: 1.0000 | ORAL_TABLET | Freq: Four times a day (QID) | ORAL | 0 refills | Status: DC | PRN

## 2020-05-11 NOTE — Assessment & Plan Note (Addendum)
PMP reviewed no red flags.  Patient has been very appropriate and refilling his prescriptions.  Given that he would like to try oxycodone and equivalent dose, I think that this is reasonable.  Discussed with Dr. Ardelia Mems who agrees given that patient has been appropriate.  Will DC hydrocodone, currently MME 20 mg.  Will start oxycodone/acetaminophen 2.5/325 every 6 hours, MME 15.  Patient advised to let provider know if he would like to continue with this regimen or switch back to his old regimen after 1 month.  Okay to refill for 3 months.  Next appointment should be in September.  He will need a UDS at that time.

## 2020-05-11 NOTE — Progress Notes (Signed)
    SUBJECTIVE:   CHIEF COMPLAINT / HPI:   Chronic Back Pain/chronic pain syndrome Wants to see if he can switch from hydrocodone to oxycodone at an equivalent dose Thinks it isn't helping as much Chronic pain and has back of it frequently goes to his legs, unchanged Also has a history of neuropathy Has been on much higher doses in the past and been able to decrease, but states that he is worried that it is not helping anymore He is not interested in increasing his dose, he understands the risks, he just wants better pain control  Bilateral Feet pain L>R Reports having low arches Feels like his "first knuckle" hit the ground first Has a history of neuropathy Pain is more in the toes and sometimes the top of the foot Have tightening with bending Finds the pain hard to describe Has cramping, sometimes shapr pains in the top of the foot Thinks it has to do with his flat feet Has worn insoles and has had some made by Dr. Latanya Maudlin many years ago that didn't help   PERTINENT  PMH / PSH: Hypertension, chronic pain, CAD, HLD  OBJECTIVE:   BP 130/68   Pulse 99 Comment: 2nd attempt provider informed  Ht 5\' 8"  (1.727 m)   Wt 258 lb 6 oz (117.2 kg)   SpO2 96%   BMI 39.29 kg/m    Physical Exam:  General: 64 y.o. male in NAD Lungs: Breathing comfortably on room air Skin: warm and dry Extremities: No edema  Bilateral feet : Inspection:  No obvious bony deformity.  No swelling, erythema, or bruising.  Pes planus bilaterally.  Right foot with visible veins on medial foot bilateral foot pain Palpation: No tenderness to palpation bilaterally, mild pain with loading of first MTP bilaterally ROM: Full ROM of the ankle aside from mildly limited dorsiflexion on left Strength: 5/5 strength ankle in all planes Neurovascular: 1+ dorsalis pedis pulses bilaterally Special tests: Normal calcaneal motion with heel raise    ASSESSMENT/PLAN:   Chronic pain syndrome PMP reviewed no red flags.   Patient has been very appropriate and refilling his prescriptions.  Given that he would like to try oxycodone and equivalent dose, I think that this is reasonable.  Discussed with Dr. Ardelia Mems who agrees given that patient has been appropriate.  Will DC hydrocodone, currently MME 20 mg.  Will start oxycodone/acetaminophen 2.5/325 every 6 hours, MME 15.  Patient advised to let provider know if he would like to continue with this regimen or switch back to his old regimen after 1 month.  Okay to refill for 3 months.  Next appointment should be in September.  He will need a UDS at that time.  Foot pain, bilateral Discussed with patient it could be multifactorial including osteoarthritis, pes planus, radiation of back pain, no neuropathy.  Patient also has decreased pulses in bilateral legs.  He would like to come back for ABIs.  Discussed possible referral to sports medicine for orthotic insertion.  He would like to hold off on this and try the change from hydrocodone to oxycodone to see if this helps.  He is to follow-up in 1 month, unless he has improvement, then he can follow-up in 3 months.     Cleophas Dunker, [place]

## 2020-05-11 NOTE — Assessment & Plan Note (Signed)
Discussed with patient it could be multifactorial including osteoarthritis, pes planus, radiation of back pain, no neuropathy.  Patient also has decreased pulses in bilateral legs.  He would like to come back for ABIs.  Discussed possible referral to sports medicine for orthotic insertion.  He would like to hold off on this and try the change from hydrocodone to oxycodone to see if this helps.  He is to follow-up in 1 month, unless he has improvement, then he can follow-up in 3 months.

## 2020-05-11 NOTE — Patient Instructions (Signed)
Thank you for coming to see me today. It was a pleasure. Today we talked about:   We will change your medication for one month to see if that helps.  Please follow-up with me in 1 month.  If you have any questions or concerns, please do not hesitate to call the office at [phone].  Best,   Arizona Constable, DO

## 2020-05-12 ENCOUNTER — Other Ambulatory Visit: Payer: Self-pay | Admitting: Family Medicine

## 2020-05-12 ENCOUNTER — Telehealth: Payer: Self-pay | Admitting: Family Medicine

## 2020-05-12 ENCOUNTER — Encounter: Payer: Self-pay | Admitting: Family Medicine

## 2020-05-12 DIAGNOSIS — G894 Chronic pain syndrome: Secondary | ICD-10-CM

## 2020-05-12 DIAGNOSIS — G8929 Other chronic pain: Secondary | ICD-10-CM

## 2020-05-12 MED ORDER — HYDROCODONE-ACETAMINOPHEN 5-325 MG PO TABS: 1.0000 | ORAL_TABLET | Freq: Four times a day (QID) | ORAL | 0 refills | Status: DC | PRN

## 2020-05-12 NOTE — Telephone Encounter (Signed)
Pt is calling because the new medication Percocet is taking a long time to get in. The pharmacy had to special order it. The percocet will not be in the store until tomorrow at 5 pm. He would like to go back to the previous medication until this can be in stock. He said that he has been without his medication a for few days now and the pain is unbearable . Please call to discuss his options jw

## 2020-05-12 NOTE — Progress Notes (Signed)
Percocet will not be in until tomorrow at 5pm.  Will send previous norco for two days so patient is not without.

## 2020-05-12 NOTE — Telephone Encounter (Signed)
Rx sent for 2 days of norco to hold patient over.  Sent [employer].

## 2020-05-18 ENCOUNTER — Encounter: Payer: Self-pay | Admitting: Family Medicine

## 2020-05-21 ENCOUNTER — Other Ambulatory Visit: Payer: Self-pay | Admitting: Family Medicine

## 2020-05-21 DIAGNOSIS — R238 Other skin changes: Secondary | ICD-10-CM

## 2020-05-21 DIAGNOSIS — R21 Rash and other nonspecific skin eruption: Secondary | ICD-10-CM

## 2020-05-21 MED ORDER — SELENIUM SULFIDE 2.5 % EX LOTN: 1.0000 "application " | TOPICAL_LOTION | Freq: Every day | CUTANEOUS | 1 refills | Status: DC | PRN

## 2020-05-21 MED ORDER — NYSTATIN 100000 UNIT/GM EX CREA: TOPICAL_CREAM | Freq: Two times a day (BID) | CUTANEOUS | 1 refills | Status: DC

## 2020-05-21 NOTE — Progress Notes (Signed)
Refills sent at patient request

## 2020-06-14 ENCOUNTER — Encounter: Payer: Self-pay | Admitting: Family Medicine

## 2020-06-14 ENCOUNTER — Ambulatory Visit (INDEPENDENT_AMBULATORY_CARE_PROVIDER_SITE_OTHER): Payer: BC Managed Care – PPO | Admitting: Family Medicine

## 2020-06-14 ENCOUNTER — Other Ambulatory Visit: Payer: Self-pay

## 2020-06-14 VITALS — BP 128/70 | HR 123 | Ht 68.0 in | Wt 256.8 lb

## 2020-06-14 DIAGNOSIS — M545 Low back pain, unspecified: Secondary | ICD-10-CM

## 2020-06-14 DIAGNOSIS — F1190 Opioid use, unspecified, uncomplicated: Secondary | ICD-10-CM | POA: Diagnosis not present

## 2020-06-14 DIAGNOSIS — G8929 Other chronic pain: Secondary | ICD-10-CM | POA: Diagnosis not present

## 2020-06-14 DIAGNOSIS — Z79891 Long term (current) use of opiate analgesic: Secondary | ICD-10-CM | POA: Diagnosis not present

## 2020-06-14 MED ORDER — OXYCODONE HCL 5 MG PO CAPS: 5.0000 mg | ORAL_CAPSULE | Freq: Four times a day (QID) | ORAL | 0 refills | Status: DC | PRN

## 2020-06-14 NOTE — Progress Notes (Signed)
    SUBJECTIVE:   CHIEF COMPLAINT / HPI:   Chronic Low Back Pain History of chronic low back pain with radiculopathy At last visit on 6/3, patient wanted to make the switch from hydrocodone/acetaminophen to oxycodone/acetaminophen at equivalent dosing to see if this would help with his pain He reports that he has not seen improvement with this and that the oxycodone/acetaminophen has been incredibly expensive for him He is wondering if he can switch to just oxycodone versus go back to hydrocodone, but increase the dose Previously he had been on much higher morphine equivalent dosing and had weaned himself, but thinks that his pain is not as well controlled with his current activity level  PERTINENT  PMH / PSH: Hypertension, osteoarthritis, chronic pain, CAD, hyperlipidemia, PAD  OBJECTIVE:   BP 128/70   Pulse (!) 123   Ht 5\' 8"  (1.727 m)   Wt 256 lb 12.8 oz (116.5 kg)   SpO2 95%   BMI 39.05 kg/m    Physical Exam:  General: 64 y.o. male in NAD Lungs: No increased work of breathing on room air Skin: warm and dry Extremities: Mildly antalgic gait   ASSESSMENT/PLAN:   Chronic back pain Patient with most recent lumbar films in 2018, showed significant degenerative scoliosis, multilevel degenerative disc disease and spondylosis.  PMP reviewed and appropriate.  Patient is always been very appropriate with his medication management and chronic opioid use.  Discussed with Dr. Nori Riis.  She agrees it seems reasonable to increase this patient's dose.  Will change from oxycodone/acetaminophen 2.5-325 every 6 hours as needed to oxycodone 5 mg every 6 hours as needed.  Patient advised that he can use an over-the-counter Tylenol with this as he would like.  This should improve cause and hopefully also improve his pain.  We will follow up and see how patient is doing with this change in 1 to 2 months.  UDS also collected today.   Patient advised to come back in 1 to 2 months to discuss other  chronic health issues.  Cleophas Dunker, [place]

## 2020-06-14 NOTE — Patient Instructions (Signed)
Thank you for coming to see me today. It was a pleasure. Today we talked about:   We increased your dose of pain medicine.  If you have trouble getting it, let me know.  Please follow-up with me in 1-2 months to talk about your other health problems.  If you have any questions or concerns, please do not hesitate to call the office at [phone].  Best,   Ari[place] Constable, DO

## 2020-06-15 DIAGNOSIS — F1190 Opioid use, unspecified, uncomplicated: Secondary | ICD-10-CM | POA: Insufficient documentation

## 2020-06-15 NOTE — Assessment & Plan Note (Signed)
Patient with most recent lumbar films in 2018, showed significant degenerative scoliosis, multilevel degenerative disc disease and spondylosis.  PMP reviewed and appropriate.  Patient is always been very appropriate with his medication management and chronic opioid use.  Discussed with Dr. Nori Riis.  She agrees it seems reasonable to increase this patient's dose.  Will change from oxycodone/acetaminophen 2.5-325 every 6 hours as needed to oxycodone 5 mg every 6 hours as needed.  Patient advised that he can use an over-the-counter Tylenol with this as he would like.  This should improve cause and hopefully also improve his pain.  We will follow up and see how patient is doing with this change in 1 to 2 months.  UDS also collected today.

## 2020-06-16 ENCOUNTER — Encounter: Payer: Self-pay | Admitting: Family Medicine

## 2020-06-16 LAB — TOXASSURE SELECT 13 (MW), URINE

## 2020-06-19 ENCOUNTER — Encounter: Payer: Self-pay | Admitting: Family Medicine

## 2020-07-04 ENCOUNTER — Other Ambulatory Visit: Payer: Self-pay | Admitting: Family Medicine

## 2020-07-04 ENCOUNTER — Telehealth: Payer: Self-pay

## 2020-07-04 DIAGNOSIS — J439 Emphysema, unspecified: Secondary | ICD-10-CM

## 2020-07-04 MED ORDER — INCRUSE ELLIPTA 62.5 MCG/INH IN AEPB: 1.0000 | INHALATION_SPRAY | Freq: Every day | RESPIRATORY_TRACT | 3 refills | Status: DC

## 2020-07-04 NOTE — Telephone Encounter (Signed)
[facility] is requesting the Rx for Incruse Ellipta be sent in as a 90 day supply with 3 refills. Ottis Stain, CMA

## 2020-07-04 NOTE — Progress Notes (Signed)
90 day supply sent at pharmacy request

## 2020-07-04 NOTE — Telephone Encounter (Signed)
Rx sent 

## 2020-07-12 ENCOUNTER — Encounter: Payer: Self-pay | Admitting: Family Medicine

## 2020-07-13 ENCOUNTER — Encounter: Payer: Self-pay | Admitting: Family Medicine

## 2020-07-13 ENCOUNTER — Other Ambulatory Visit: Payer: Self-pay | Admitting: Family Medicine

## 2020-07-13 DIAGNOSIS — G8929 Other chronic pain: Secondary | ICD-10-CM

## 2020-07-13 MED ORDER — OXYCODONE HCL 5 MG PO CAPS: 5.0000 mg | ORAL_CAPSULE | Freq: Four times a day (QID) | ORAL | 0 refills | Status: DC | PRN

## 2020-07-13 NOTE — Progress Notes (Signed)
Covering for Dr. Sandi Carne.  Refilling chronic oxycodone prescription as referenced to in recent clinic visit, reviewed PMP aware prior to and appropriate.   Patriciaann Clan, DO

## 2020-07-31 NOTE — Progress Notes (Deleted)
° ° °  SUBJECTIVE:   CHIEF COMPLAINT / HPI:   Chronic low back pain History of chronic low back pain with radiculopathy Current regimen: Oxycodone 5 mg every 6 hours as needed ***  HLD Current regimen: Lipitor 80 mg daily Last lipid panel 02/16/2020, LDL 72 at that time neck  Hypertension Current regimen: Norvasc 5 mg daily, losartan 100 mg nightly Reports compliance*** Denies chest pain, shortness of breath, leg edema*** Last BMP 02/16/2020, stable  COPD Current regimen: Albuterol as needed, Incruse Ellipta daily ***  PERTINENT  PMH / PSH: ***  OBJECTIVE:   There were no vitals taken for this visit.  ***  ASSESSMENT/PLAN:   No problem-specific Assessment & Plan notes found for this encounter.     George Leon, [name]

## 2020-08-01 ENCOUNTER — Ambulatory Visit: Payer: BC Managed Care – PPO | Admitting: Family Medicine

## 2020-08-07 ENCOUNTER — Ambulatory Visit (INDEPENDENT_AMBULATORY_CARE_PROVIDER_SITE_OTHER): Payer: BC Managed Care – PPO | Admitting: Family Medicine

## 2020-08-07 ENCOUNTER — Other Ambulatory Visit: Payer: Self-pay

## 2020-08-07 ENCOUNTER — Encounter: Payer: Self-pay | Admitting: Family Medicine

## 2020-08-07 VITALS — BP 120/74 | HR 103 | Wt 256.2 lb

## 2020-08-07 DIAGNOSIS — R21 Rash and other nonspecific skin eruption: Secondary | ICD-10-CM

## 2020-08-07 DIAGNOSIS — J449 Chronic obstructive pulmonary disease, unspecified: Secondary | ICD-10-CM | POA: Diagnosis not present

## 2020-08-07 DIAGNOSIS — M545 Low back pain: Secondary | ICD-10-CM | POA: Diagnosis not present

## 2020-08-07 DIAGNOSIS — G4700 Insomnia, unspecified: Secondary | ICD-10-CM | POA: Diagnosis not present

## 2020-08-07 DIAGNOSIS — G8929 Other chronic pain: Secondary | ICD-10-CM | POA: Diagnosis not present

## 2020-08-07 DIAGNOSIS — F17200 Nicotine dependence, unspecified, uncomplicated: Secondary | ICD-10-CM

## 2020-08-07 DIAGNOSIS — R238 Other skin changes: Secondary | ICD-10-CM | POA: Diagnosis not present

## 2020-08-07 DIAGNOSIS — I5032 Chronic diastolic (congestive) heart failure: Secondary | ICD-10-CM

## 2020-08-07 DIAGNOSIS — I10 Essential (primary) hypertension: Secondary | ICD-10-CM | POA: Diagnosis not present

## 2020-08-07 MED ORDER — NYSTATIN 100000 UNIT/GM EX CREA: TOPICAL_CREAM | Freq: Two times a day (BID) | CUTANEOUS | 1 refills | Status: DC

## 2020-08-07 MED ORDER — OXYCODONE HCL 5 MG PO CAPS: 5.0000 mg | ORAL_CAPSULE | Freq: Four times a day (QID) | ORAL | 0 refills | Status: DC | PRN

## 2020-08-07 MED ORDER — SELENIUM SULFIDE 2.5 % EX LOTN: 1.0000 "application " | TOPICAL_LOTION | Freq: Every day | CUTANEOUS | 1 refills | Status: DC | PRN

## 2020-08-07 MED ORDER — ALBUTEROL SULFATE HFA 108 (90 BASE) MCG/ACT IN AERS: 1.0000 | INHALATION_SPRAY | RESPIRATORY_TRACT | 1 refills | Status: DC | PRN

## 2020-08-07 NOTE — Assessment & Plan Note (Signed)
Patient continues to smoke.  He is not quite interested in quitting at this time, but will contact us if needed.  Reports that he would be willing to make an appointment with Dr. Valentina Lucks our pharmacist to discuss this further as well.

## 2020-08-07 NOTE — Assessment & Plan Note (Signed)
Doing well with his Incruse inhaler.  Requesting albuterol refill.  Albuterol prescribed, to use as needed.  Discussed tobacco cessation, see below.

## 2020-08-07 NOTE — Assessment & Plan Note (Signed)
Patient doing well from a breathing standpoint, no chest pain.  Having some swelling in his feet, this is most likely secondary to chronic venous insufficiency.  Will discuss obtaining another echo at next visit, but as long as he is doing okay right now, no need to rush.

## 2020-08-07 NOTE — Patient Instructions (Addendum)
Thank you for coming to see me today. It was a pleasure. Today we talked about:   Try melatonin for sleeping  Get compression stockings 20-93mmHg from [facility]  I have sent your refills.  Consider making an appointment to discuss quitting smoking.  Please follow-up with me in 3 months.  If you have any questions or concerns, please do not hesitate to call the office at [phone].  Best,   Arizona Constable, DO

## 2020-08-07 NOTE — Assessment & Plan Note (Signed)
BP well controlled today.  Continue current regimen of losartan and Norvasc.  Last BMP 02/16/2020, within normal limits, no repeat needed at this time.

## 2020-08-07 NOTE — Progress Notes (Signed)
SUBJECTIVE:   CHIEF COMPLAINT / HPI:   Chronic Low Back Pain Patient last seen on 06/14/2020, at that time was changed to oxycodone 5 mg every 6 hours as needed and advised to take over-the-counter Tylenol due to uncontrolled pain UDS also performed at that time that was appropriate Has been doing well with this, but accidentally dropped his tablets in the toilet last night and lost about 5-6 days worth he thinks Patient has always been very appropriate with refills and medications  Hypertension Current regimen losartan 100 mg nightly, Norvasc 5 mg Reports compliance Denies chest pain, shortness of breath Started having swelling in his feet about a week ago Comes and goes, worse in the evenings Last BMP 02-16-20 that was WNL  Diastolic CHF Last echo 44/0347, EF 55 to 60%, G1DD Breathing is doing well with his COPD management Having a little bit of swelling in his feet over the last week that worsens in the evenings, especially after being on his feet  COPD Current regimen: Incruse Ellipta, albuterol as needed Doing well Needs albuterol refill  He is still smoking, states that he has tried to quit in the past and been successful for about 4 months, but is harder when he is around other to smoke  Scalp irritation Uses selenium sulfide with good success Requesting a refill  Sleeping difficulty Reports some difficulty with sleeping, also reports that his wife may have something to do with this as she usually sleeps in the middle of the bed Has not tried anything over-the-counter, states that his son uses melatonin and he is wondering if this would be okay  Intertrigo Patient has a history of intertrigo and yeast like rash for which he uses nystatin cream as needed He is requesting a refill on this   PERTINENT  PMH / PSH: HTN, OA, chronic pain, CAD, HLD, PAD  OBJECTIVE:   BP 120/74   Pulse (!) 103   Wt 256 lb 3.2 oz (116.2 kg)   SpO2 94%   BMI 38.96 kg/m    Physical  Exam:  General: 64 y.o. male in NAD Lungs: Breathing comfortably on RA Skin: warm and dry Extremities: Reports no edema, declined foot exam Psych: mood and affect appropriate for circumstance. Appropriate dress   ASSESSMENT/PLAN:   Chronic back pain PMP review, no red flags.  Patient has always been very appropriate with his refills of chronic medication.  UDS performed at last visit and was without red flags.  Okay to do one-time early refill for patient for his oxycodone.  We will continue with his current dose as he is having good success with this.  HYPERTENSION, BENIGN BP well controlled today.  Continue current regimen of losartan and Norvasc.  Last BMP 02/16/2020, within normal limits, no repeat needed at this time.  Diastolic CHF ([place]) Patient doing well from a breathing standpoint, no chest pain.  Having some swelling in his feet, this is most likely secondary to chronic venous insufficiency.  Will discuss obtaining another echo at next visit, but as long as he is doing okay right now, no need to rush.  Obstructive chronic bronchitis without exacerbation ([place]) Doing well with his Incruse inhaler.  Requesting albuterol refill.  Albuterol prescribed, to use as needed.  Discussed tobacco cessation, see below.  TOBACCO ABUSE Patient continues to smoke.  He is not quite interested in quitting at this time, but will contact us if needed.  Reports that he would be willing to make an appointment with  Dr. Valentina Lucks our pharmacist to discuss this further as well.  Scalp irritation Uses selenium sulfide daily as needed for flakes and irritation.  Refill provided.  Insomnia Discussed that this may be more behavioral and secondary to wife's position in bed.  Could advised he could try over-the-counter melatonin if he would like.  Could also consider sleeping in a different bed from his wife or getting a larger bed.  Rash and nonspecific skin eruption Refill provided for nystatin cream to use  as needed for intertrigo.  Also keep area dry.     Cleophas Dunker, [place]

## 2020-08-07 NOTE — Assessment & Plan Note (Signed)
PMP review, no red flags.  Patient has always been very appropriate with his refills of chronic medication.  UDS performed at last visit and was without red flags.  Okay to do one-time early refill for patient for his oxycodone.  We will continue with his current dose as he is having good success with this.

## 2020-08-07 NOTE — Assessment & Plan Note (Signed)
Discussed that this may be more behavioral and secondary to wife's position in bed.  Could advised he could try over-the-counter melatonin if he would like.  Could also consider sleeping in a different bed from his wife or getting a larger bed.

## 2020-08-07 NOTE — Assessment & Plan Note (Signed)
Refill provided for nystatin cream to use as needed for intertrigo.  Also keep area dry.

## 2020-08-07 NOTE — Assessment & Plan Note (Signed)
Uses selenium sulfide daily as needed for flakes and irritation.  Refill provided.

## 2020-08-19 ENCOUNTER — Other Ambulatory Visit: Payer: Self-pay | Admitting: Family Medicine

## 2020-08-19 DIAGNOSIS — I10 Essential (primary) hypertension: Secondary | ICD-10-CM

## 2020-09-05 ENCOUNTER — Encounter: Payer: Self-pay | Admitting: Family Medicine

## 2020-09-06 ENCOUNTER — Other Ambulatory Visit: Payer: Self-pay | Admitting: Family Medicine

## 2020-09-06 DIAGNOSIS — G8929 Other chronic pain: Secondary | ICD-10-CM

## 2020-09-06 MED ORDER — OXYCODONE HCL 5 MG PO CAPS: 5.0000 mg | ORAL_CAPSULE | Freq: Four times a day (QID) | ORAL | 0 refills | Status: DC | PRN

## 2020-09-06 NOTE — Progress Notes (Signed)
Rx for oxycodone. PMP reviewed, no red flags. Last visit 8/30

## 2020-09-19 ENCOUNTER — Other Ambulatory Visit: Payer: Self-pay | Admitting: Family Medicine

## 2020-09-19 DIAGNOSIS — K219 Gastro-esophageal reflux disease without esophagitis: Secondary | ICD-10-CM

## 2020-09-19 DIAGNOSIS — I10 Essential (primary) hypertension: Secondary | ICD-10-CM

## 2020-09-19 DIAGNOSIS — G894 Chronic pain syndrome: Secondary | ICD-10-CM

## 2020-09-19 DIAGNOSIS — G8929 Other chronic pain: Secondary | ICD-10-CM

## 2020-09-19 DIAGNOSIS — E785 Hyperlipidemia, unspecified: Secondary | ICD-10-CM

## 2020-09-20 ENCOUNTER — Other Ambulatory Visit: Payer: Self-pay | Admitting: *Deleted

## 2020-09-20 DIAGNOSIS — J449 Chronic obstructive pulmonary disease, unspecified: Secondary | ICD-10-CM

## 2020-09-20 MED ORDER — ALBUTEROL SULFATE HFA 108 (90 BASE) MCG/ACT IN AERS: 1.0000 | INHALATION_SPRAY | RESPIRATORY_TRACT | 1 refills | Status: DC | PRN

## 2020-10-09 ENCOUNTER — Other Ambulatory Visit: Payer: Self-pay

## 2020-10-09 ENCOUNTER — Encounter: Payer: Self-pay | Admitting: Family Medicine

## 2020-10-09 DIAGNOSIS — M545 Low back pain, unspecified: Secondary | ICD-10-CM

## 2020-10-09 MED ORDER — OXYCODONE HCL 5 MG PO CAPS: 5.0000 mg | ORAL_CAPSULE | Freq: Four times a day (QID) | ORAL | 0 refills | Status: DC | PRN

## 2020-10-09 NOTE — Telephone Encounter (Signed)
PMP reviewed, no red flags. 

## 2020-10-16 ENCOUNTER — Ambulatory Visit: Payer: BC Managed Care – PPO | Admitting: Family Medicine

## 2020-10-23 ENCOUNTER — Ambulatory Visit: Payer: BC Managed Care – PPO | Admitting: Family Medicine

## 2020-10-30 ENCOUNTER — Encounter: Payer: Self-pay | Admitting: Family Medicine

## 2020-10-30 ENCOUNTER — Other Ambulatory Visit: Payer: Self-pay

## 2020-10-30 ENCOUNTER — Ambulatory Visit (INDEPENDENT_AMBULATORY_CARE_PROVIDER_SITE_OTHER): Payer: BC Managed Care – PPO | Admitting: Family Medicine

## 2020-10-30 VITALS — BP 126/64 | HR 93 | Ht 68.0 in | Wt 263.4 lb

## 2020-10-30 DIAGNOSIS — I5032 Chronic diastolic (congestive) heart failure: Secondary | ICD-10-CM | POA: Diagnosis not present

## 2020-10-30 DIAGNOSIS — Z23 Encounter for immunization: Secondary | ICD-10-CM

## 2020-10-30 DIAGNOSIS — I10 Essential (primary) hypertension: Secondary | ICD-10-CM | POA: Diagnosis not present

## 2020-10-30 DIAGNOSIS — M5442 Lumbago with sciatica, left side: Secondary | ICD-10-CM

## 2020-10-30 DIAGNOSIS — R21 Rash and other nonspecific skin eruption: Secondary | ICD-10-CM | POA: Diagnosis not present

## 2020-10-30 DIAGNOSIS — R238 Other skin changes: Secondary | ICD-10-CM | POA: Diagnosis not present

## 2020-10-30 DIAGNOSIS — G8929 Other chronic pain: Secondary | ICD-10-CM | POA: Diagnosis not present

## 2020-10-30 MED ORDER — NYSTATIN 100000 UNIT/GM EX CREA: TOPICAL_CREAM | Freq: Two times a day (BID) | CUTANEOUS | 1 refills | Status: DC

## 2020-10-30 MED ORDER — SELENIUM SULFIDE 2.5 % EX LOTN: 1.0000 "application " | TOPICAL_LOTION | Freq: Every day | CUTANEOUS | 2 refills | Status: DC | PRN

## 2020-10-30 NOTE — Assessment & Plan Note (Signed)
Refill provided for patient to use nystatin sparingly for rash in pannus and groin.  Also keep the area clean and dry.

## 2020-10-30 NOTE — Assessment & Plan Note (Addendum)
Discussed with Dr. Andria Frames given change in patient's controlled substance prescription.  Will change him from oxycodone 5 mg every 6 hours to Norco 7.5 mg - 325 mg every 6 hours, morphine equivalent dosing essentially the same.  Patient is happy with this change as well.  He still has a prescription of oxycodone at home, but when needs next refill, will refill with the Norco.  We will continue to encourage limiting of controlled substances and wean as tolerated, however patient is very comfortable with his pain regimen at present.  Last UDS in July.  He is aware that he will need to follow-up in 3 months in order to continue with prescriptions.

## 2020-10-30 NOTE — Assessment & Plan Note (Signed)
Refill provided for selenium sulfide shampoo.

## 2020-10-30 NOTE — Assessment & Plan Note (Signed)
BP is well controlled today.  Continue current regimen of Norvasc and losartan.  Will obtain a BMP in March when it also obtain a lipid panel.

## 2020-10-30 NOTE — Patient Instructions (Signed)
Thank you for coming to see me today. It was a pleasure. Today we talked about:   We will start you on norco 7.5mg -325mg  four times daily when your oxycodone runs out.  Please follow-up with me in 3 months.  If you have any questions or concerns, please do not hesitate to call the office at [phone].  Best,   [place] Constable, DO

## 2020-10-30 NOTE — Progress Notes (Signed)
SUBJECTIVE:   CHIEF COMPLAINT / HPI:   Chronic low back pain Patient last seen on 08/07/2020 Currently on oxycodone 5 mg every 6 hours as needed Last UDS July 2021 Previously, patient was on Norco 5-325 mg every 6 hours as needed for pain Even before this, he had been on 1 to 2 tablets or even 2 tablets every 6 hours as needed for pain He reports that his wife and his son are very concerned that he is taking oxycodone and that he will die from this He states that he understands that it is safe, however he wants to change back to Norco to help make them feel more comfortable The reason why he was changed from [place] 5-325 mg every 6 hours to oxycodone was because he needed increased pain control, therefore he would like to have something higher than this previous Norco prescription to ensure that his pain is still well controlled  HFpEF Last echo 11/2015, EF at that time 73 to 60%, G1 DD Current regimen: Losartan 100 mg Overall, his breathing is doing well, denies chest pain  Hypertension Current regimen: Norvasc 5 mg, losartan 100 mg Denies chest pain, shortness of breath, leg swelling  Scalp irritation Selenium sulfide shampoo has been used as needed and patient does very well with this He is asking for refill on this  Intertrigo Patient has intertrigo off and on in his skin folds Tries to keep the area clean He uses nystatin when the rash worsens He is asking for refill on this   PERTINENT  PMH / PSH: HTN, OA, chronic pain, CAD, HLD, PAD, HFpEF  OBJECTIVE:   BP 126/64   Pulse 93   Ht 5\' 8"  (1.727 m)   Wt 263 lb 6.4 oz (119.5 kg)   SpO2 96%   BMI 40.05 kg/m    Physical Exam:  General: 64 y.o. male in NAD Lungs: Breathing comfortably on room air Skin: warm and dry Psych: Mood and affect appropriate for circumstance, thought process linear and logical   ASSESSMENT/PLAN:   Chronic back pain Discussed with Dr. Andria Frames given change in patient's controlled  substance prescription.  Will change him from oxycodone 5 mg every 6 hours to Norco 7.5 mg - 325 mg every 6 hours, morphine equivalent dosing essentially the same.  Patient is happy with this change as well.  He still has a prescription of oxycodone at home, but when needs next refill, will refill with the Norco.  We will continue to encourage limiting of controlled substances and wean as tolerated, however patient is very comfortable with his pain regimen at present.  Last UDS in July.  He is aware that he will need to follow-up in 3 months in order to continue with prescriptions.  Diastolic CHF ([place]) Discussed obtaining another echo given that his last one was in 2016, however patient would like to hold off on this at this time.  He would like to discuss it after the holidays.  HYPERTENSION, BENIGN BP is well controlled today.  Continue current regimen of Norvasc and losartan.  Will obtain a BMP in March when it also obtain a lipid panel.  Scalp irritation Refill provided for selenium sulfide shampoo.  Rash and nonspecific skin eruption Refill provided for patient to use nystatin sparingly for rash in pannus and groin.  Also keep the area clean and dry.   Plan for labs to be drawn in March when patient is also due for lipid panel   Bernita Raisin  [place]

## 2020-10-30 NOTE — Assessment & Plan Note (Signed)
Discussed obtaining another echo given that his last one was in 2016, however patient would like to hold off on this at this time.  He would like to discuss it after the holidays.

## 2020-11-07 ENCOUNTER — Encounter: Payer: Self-pay | Admitting: Family Medicine

## 2020-11-07 ENCOUNTER — Other Ambulatory Visit: Payer: Self-pay | Admitting: Family Medicine

## 2020-11-07 DIAGNOSIS — M5442 Lumbago with sciatica, left side: Secondary | ICD-10-CM

## 2020-11-07 DIAGNOSIS — G894 Chronic pain syndrome: Secondary | ICD-10-CM

## 2020-11-07 DIAGNOSIS — G8929 Other chronic pain: Secondary | ICD-10-CM

## 2020-11-07 MED ORDER — HYDROCODONE-ACETAMINOPHEN 7.5-325 MG PO TABS: 1.0000 | ORAL_TABLET | Freq: Four times a day (QID) | ORAL | 0 refills | Status: DC | PRN

## 2020-11-07 NOTE — Progress Notes (Signed)
Change in therapy per last office note.  PMP reviewed, no red flags.

## 2020-11-24 ENCOUNTER — Encounter: Payer: Self-pay | Admitting: Family Medicine

## 2020-12-08 ENCOUNTER — Encounter: Payer: Self-pay | Admitting: Family Medicine

## 2020-12-08 DIAGNOSIS — G8929 Other chronic pain: Secondary | ICD-10-CM

## 2020-12-08 DIAGNOSIS — G894 Chronic pain syndrome: Secondary | ICD-10-CM

## 2020-12-11 MED ORDER — HYDROCODONE-ACETAMINOPHEN 7.5-325 MG PO TABS: 1.0000 | ORAL_TABLET | Freq: Four times a day (QID) | ORAL | 0 refills | Status: DC | PRN

## 2020-12-26 ENCOUNTER — Encounter: Payer: Self-pay | Admitting: Family Medicine

## 2021-01-09 ENCOUNTER — Encounter: Payer: Self-pay | Admitting: Family Medicine

## 2021-01-09 ENCOUNTER — Other Ambulatory Visit: Payer: Self-pay | Admitting: Family Medicine

## 2021-01-09 DIAGNOSIS — G8929 Other chronic pain: Secondary | ICD-10-CM

## 2021-01-09 DIAGNOSIS — G894 Chronic pain syndrome: Secondary | ICD-10-CM

## 2021-01-09 MED ORDER — HYDROCODONE-ACETAMINOPHEN 7.5-325 MG PO TABS: 1.0000 | ORAL_TABLET | Freq: Four times a day (QID) | ORAL | 0 refills | Status: DC | PRN

## 2021-01-09 NOTE — Progress Notes (Signed)
PMP reviewed, no red flags. 

## 2021-01-15 ENCOUNTER — Encounter: Payer: Self-pay | Admitting: Family Medicine

## 2021-01-15 ENCOUNTER — Other Ambulatory Visit: Payer: Self-pay

## 2021-01-15 ENCOUNTER — Ambulatory Visit (INDEPENDENT_AMBULATORY_CARE_PROVIDER_SITE_OTHER): Payer: BC Managed Care – PPO | Admitting: Family Medicine

## 2021-01-15 VITALS — BP 142/60 | HR 100 | Ht 68.0 in | Wt 267.4 lb

## 2021-01-15 DIAGNOSIS — M25561 Pain in right knee: Secondary | ICD-10-CM | POA: Insufficient documentation

## 2021-01-15 DIAGNOSIS — M25511 Pain in right shoulder: Secondary | ICD-10-CM | POA: Insufficient documentation

## 2021-01-15 DIAGNOSIS — G894 Chronic pain syndrome: Secondary | ICD-10-CM

## 2021-01-15 DIAGNOSIS — F1190 Opioid use, unspecified, uncomplicated: Secondary | ICD-10-CM | POA: Diagnosis not present

## 2021-01-15 DIAGNOSIS — R238 Other skin changes: Secondary | ICD-10-CM

## 2021-01-15 DIAGNOSIS — R21 Rash and other nonspecific skin eruption: Secondary | ICD-10-CM

## 2021-01-15 MED ORDER — METHYLPREDNISOLONE ACETATE 40 MG/ML IJ SUSP
40.0000 mg | Freq: Once | INTRAMUSCULAR | Status: AC
  Administered 2021-01-15: 40 mg via INTRAMUSCULAR

## 2021-01-15 MED ORDER — NALOXONE HCL 0.4 MG/ML IJ SOLN: INTRAMUSCULAR | 0 refills | Status: DC

## 2021-01-15 MED ORDER — SELENIUM SULFIDE 2.5 % EX LOTN
1.0000 | TOPICAL_LOTION | Freq: Every day | CUTANEOUS | 2 refills | Status: DC | PRN
Start: 2021-01-15 — End: 2021-09-27

## 2021-01-15 MED ORDER — NYSTATIN 100000 UNIT/GM EX CREA
TOPICAL_CREAM | Freq: Two times a day (BID) | CUTANEOUS | 1 refills | Status: DC
Start: 2021-01-15 — End: 2021-04-05

## 2021-01-15 NOTE — Assessment & Plan Note (Signed)
History of OA.  No recent XRs in Epic, but possibly at Ortho as also follows with Dr. Ninfa Linden.  Has previously had success with CSI injection, therefore requested today.  Risks and benefits discussed.  Performed per above.  Tolerated well.  Advised to monitor for signs of infection.

## 2021-01-15 NOTE — Assessment & Plan Note (Signed)
Refill provided for nystatin.  If needs another refill, will perform repeat exam.

## 2021-01-15 NOTE — Progress Notes (Signed)
SUBJECTIVE:   CHIEF COMPLAINT / HPI:   Chronic pain Current regimen: Norco 7.5-325 mg every 6 hours as needed Doing well with this, does not feel that he can decrease dose at this time Last refill 2/2, PMP without red flags Does not have Rx for naloxone yet  Chronic right knee pain Last x-rays in chart from 03/03/2017 of left knee show mild arthritic changes last right knee x-ray from 2016 with no arthropathy Has had left knee surgery previously, meniscus Right knee pain worse than left He has not had an injection in the last few years Had recevied them previously every3 months and did well with them  Right Shoulder Pain Hx arthoroscopy in early 2000s Has also had surgeries of neck with fusion of C5-6 Now having right index finger numbness Wants to try a cortisone shot and see if it helps and then will go to Dr. 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 male in NAD Lungs: breathing comfortably on RA Skin: warm and dry  Limited Right Shoulder: Inspection reveals no obvious deformity, atrophy, or asymmetry b/l Palpation is normal with no TTP over AC joint or bicipital groove. Full ROM in flexion, abduction, internal/external rotation NV intact distally Special Tests:  - No painful arc and no drop arm sign on right  Limited Right Knee: - Inspection: no gross deformity. No  swelling/effusion, erythema or bruising. Skin intact - Palpation: no TTP - ROM: full active ROM with flexion and extension in knee - Strength: 5/5 strength - Neuro/vasc: NV intact   Right Knee Injection Procedure performed: knee intraarticular corticosteroid injection; palpation guided  Consent obtained and verified. Time-out conducted. Noted no overlying erythema, induration, or other signs of local infection. The right medial joint space was palpated and marked. The overlying skin was prepped in a sterile fashion. Topical analgesic spray: Ethyl chloride. Joint: right knee Needle: 25 gauge, 1.5 inch Completed without difficulty. Meds: 40 mg methylrprednisolone, 4 ml 1% lidocaine without epinephrine  Advised to call if fevers/chills, erythema, induration, drainage, or persistent bleeding.   Right subacromial Injection Consent obtained and verified. Time-out conducted.  Procedure performed: subacromial corticosteroid injection; palpation guided  Consent obtained and verified. Time-out conducted. Noted no overlying erythema, induration, or other signs of local infection. The right posterior subacromial space was palpated and marked. The overlying skin was prepped in a sterile fashion. Topical analgesic spray: Ethyl chloride. Joint: right subacromial Needle: 25 gauge, 1.5 inch Completed without difficulty. Meds: 4 cc 1% lidocaine without epinephrine, 40 mg depo-medrol   Advised to call if fevers/chills, erythema, induration, drainage, or persistent bleeding.    ASSESSMENT/PLAN:   Chronic pain syndrome PMP reviewed without red flags.  Have tried to decrease previously, but patient with worsening pain.  Would again like to work on taper in  the future to decrease patient's use of opioids.  Naloxone sent to pharmacy.  Indications include hx cervical spinal fusion, lumbar spinal stenosis, b/l knee OA.    Chronic narcotic use Naloxone sent per above.  Continue to attempt to  wean.  Right knee pain History of OA.  No recent XRs in Epic, but possibly at Ortho as also follows with Dr. Ninfa Linden.  Has previously had success with CSI injection, therefore requested today.  Risks and benefits discussed.  Performed per above.  Tolerated well.  Advised to monitor for signs of infection.  Right shoulder pain It is likely that he also has some cervical stenosis contributing to right shoulder, which patient understands.  He would like to proceed with CSI injection and if no improvement, understands that he should follow up with Dr. Ninfa Linden for cervical spinal evaluation.  Procedure per above.  Tolerated well.  Advised to monitor for signs of infection.  Scalp irritation Refill provided for selsun.  Rash and nonspecific skin eruption Refill provided for nystatin.  If needs another refill, will perform repeat exam.    Plan to return in March 2022 for labs.   Cleophas Dunker, [place]

## 2021-01-15 NOTE — Assessment & Plan Note (Signed)
Naloxone sent per above.  Continue to attempt to wean.

## 2021-01-15 NOTE — Assessment & Plan Note (Signed)
Refill provided for selsun.

## 2021-01-15 NOTE — Assessment & Plan Note (Signed)
It is likely that he also has some cervical stenosis contributing to right shoulder, which patient understands.  He would like to proceed with CSI injection and if no improvement, understands that he should follow up with Dr. Ninfa Linden for cervical spinal evaluation.  Procedure per above.  Tolerated well.  Advised to monitor for signs of infection.

## 2021-01-15 NOTE — Patient Instructions (Signed)
Thank you for coming to see me today. It was a pleasure. Today we talked about:   You are good for 3 months for your pain medicine.  Today you received an injection with corticosteroid. This injection is usually done in response to pain and inflammation. There is some "numbing" medicine also in the shot so the injected area may be numb and feel really good for the next couple of hours. The numbing medicine usually wears off in 2-3 hours though, and then your pain level will be right back where it was before the injection.   The actually benefit from the steroid injection is usually noticed in 2-7 days. You may actually experience a small (as in 10%) INCREASE in pain in the first 24 hours---that is common.   Things to watch out for that you should contact us or a health care provider urgently would include: 1. Unusual (as in more than 10%) increase in pain 2. New fever > 101.5 3. New swelling or redness of the injected area.  4. Streaking of red lines around the area injected.  Please follow-up with me in 1-3 months (we can do labs in March, but can wait a little bit if needed).  If you have any questions or concerns, please do not hesitate to call the office at [phone].  Best,   [place] Constable, DO

## 2021-01-15 NOTE — Assessment & Plan Note (Signed)
PMP reviewed without red flags.  Have tried to decrease previously, but patient with worsening pain.  Would again like to work on taper in the future to decrease patient's use of opioids.  Naloxone sent to pharmacy.  Indications include hx cervical spinal fusion, lumbar spinal stenosis, b/l knee OA.

## 2021-01-16 ENCOUNTER — Other Ambulatory Visit: Payer: Self-pay | Admitting: Family Medicine

## 2021-01-16 ENCOUNTER — Telehealth: Payer: Self-pay

## 2021-01-16 DIAGNOSIS — F1190 Opioid use, unspecified, uncomplicated: Secondary | ICD-10-CM

## 2021-01-16 MED ORDER — NALOXONE HCL 0.4 MG/ML IJ SOLN: INTRAMUSCULAR | 0 refills | Status: DC

## 2021-01-16 NOTE — Telephone Encounter (Signed)
Pt wanted sent to [facility], therefore Rx sent there.

## 2021-01-16 NOTE — Telephone Encounter (Signed)
Received phone call from [facility] regarding Narcan rx. This rx is currently unavailable from the manufacturer. Pharmacist is requesting a new rx for either narcan nasal spray 4 mg or a Min-I-Jet prefilled syringe 1 mg/ml.   Please advise and escribe alternative to [facility].   Talbot Grumbling, RN

## 2021-01-19 ENCOUNTER — Encounter: Payer: Self-pay | Admitting: Family Medicine

## 2021-01-22 ENCOUNTER — Other Ambulatory Visit: Payer: Self-pay | Admitting: Family Medicine

## 2021-01-22 DIAGNOSIS — F1190 Opioid use, unspecified, uncomplicated: Secondary | ICD-10-CM

## 2021-01-22 MED ORDER — "SYRINGE/NEEDLE (DISP) 25G X 1"" 3 ML MISC": 1.0000 | Freq: Once | 0 refills | Status: AC

## 2021-01-22 NOTE — Progress Notes (Signed)
Rx needed for syringe to use with naloxone.  Called [facility] and verified that they have 39mL 25 gauge needles and only one rx needed.

## 2021-02-07 ENCOUNTER — Encounter: Payer: Self-pay | Admitting: Family Medicine

## 2021-02-07 DIAGNOSIS — G8929 Other chronic pain: Secondary | ICD-10-CM

## 2021-02-07 DIAGNOSIS — G894 Chronic pain syndrome: Secondary | ICD-10-CM

## 2021-02-07 MED ORDER — HYDROCODONE-ACETAMINOPHEN 7.5-325 MG PO TABS: 1.0000 | ORAL_TABLET | Freq: Four times a day (QID) | ORAL | 0 refills | Status: DC | PRN

## 2021-02-07 NOTE — Telephone Encounter (Signed)
PMP reviewed, no red flags. 

## 2021-03-07 ENCOUNTER — Other Ambulatory Visit: Payer: Self-pay | Admitting: Family Medicine

## 2021-03-07 ENCOUNTER — Encounter: Payer: Self-pay | Admitting: Family Medicine

## 2021-03-07 DIAGNOSIS — G8929 Other chronic pain: Secondary | ICD-10-CM

## 2021-03-07 DIAGNOSIS — G894 Chronic pain syndrome: Secondary | ICD-10-CM

## 2021-03-07 MED ORDER — HYDROCODONE-ACETAMINOPHEN 7.5-325 MG PO TABS: 1.0000 | ORAL_TABLET | Freq: Four times a day (QID) | ORAL | 0 refills | Status: DC | PRN

## 2021-03-07 NOTE — Progress Notes (Signed)
PMP reviewed, no red flags.  Rx sent for pain medication

## 2021-03-12 ENCOUNTER — Other Ambulatory Visit: Payer: Self-pay | Admitting: *Deleted

## 2021-03-12 DIAGNOSIS — J449 Chronic obstructive pulmonary disease, unspecified: Secondary | ICD-10-CM

## 2021-03-12 MED ORDER — ALBUTEROL SULFATE HFA 108 (90 BASE) MCG/ACT IN AERS: 1.0000 | INHALATION_SPRAY | RESPIRATORY_TRACT | 1 refills | Status: DC | PRN

## 2021-03-22 ENCOUNTER — Telehealth: Payer: Self-pay

## 2021-03-22 NOTE — Telephone Encounter (Signed)
Fax from [facility] asking for 3 month supply of Albuterol for pt. Last refill was 4/4. Ottis Stain, CMA

## 2021-03-26 ENCOUNTER — Other Ambulatory Visit: Payer: Self-pay | Admitting: Family Medicine

## 2021-03-26 DIAGNOSIS — J449 Chronic obstructive pulmonary disease, unspecified: Secondary | ICD-10-CM

## 2021-03-26 MED ORDER — ALBUTEROL SULFATE HFA 108 (90 BASE) MCG/ACT IN AERS: 1.0000 | INHALATION_SPRAY | RESPIRATORY_TRACT | 1 refills | Status: DC | PRN

## 2021-03-26 NOTE — Telephone Encounter (Signed)
Rx sent 

## 2021-03-28 ENCOUNTER — Telehealth: Payer: Self-pay | Admitting: *Deleted

## 2021-03-28 NOTE — Telephone Encounter (Signed)
Rx sent for albuterol, but patient should not have 3 month supply of inhaler.  Standard practice is to not refill for 3 months at a time for a rescue inhaler

## 2021-03-28 NOTE — Telephone Encounter (Signed)
Received fax from [facility] saying that pt is requesting a 90 day supply of [name], CMA

## 2021-04-02 ENCOUNTER — Other Ambulatory Visit: Payer: Self-pay

## 2021-04-02 ENCOUNTER — Ambulatory Visit (INDEPENDENT_AMBULATORY_CARE_PROVIDER_SITE_OTHER): Payer: BC Managed Care – PPO

## 2021-04-02 ENCOUNTER — Ambulatory Visit (INDEPENDENT_AMBULATORY_CARE_PROVIDER_SITE_OTHER): Payer: BC Managed Care – PPO | Admitting: Family Medicine

## 2021-04-02 ENCOUNTER — Encounter: Payer: Self-pay | Admitting: Family Medicine

## 2021-04-02 VITALS — BP 136/82 | HR 101 | Ht 68.0 in | Wt 269.8 lb

## 2021-04-02 DIAGNOSIS — Z23 Encounter for immunization: Secondary | ICD-10-CM | POA: Diagnosis not present

## 2021-04-02 DIAGNOSIS — J449 Chronic obstructive pulmonary disease, unspecified: Secondary | ICD-10-CM | POA: Insufficient documentation

## 2021-04-02 DIAGNOSIS — I10 Essential (primary) hypertension: Secondary | ICD-10-CM | POA: Diagnosis not present

## 2021-04-02 DIAGNOSIS — I5032 Chronic diastolic (congestive) heart failure: Secondary | ICD-10-CM

## 2021-04-02 DIAGNOSIS — J441 Chronic obstructive pulmonary disease with (acute) exacerbation: Secondary | ICD-10-CM

## 2021-04-02 DIAGNOSIS — M5442 Lumbago with sciatica, left side: Secondary | ICD-10-CM | POA: Diagnosis not present

## 2021-04-02 DIAGNOSIS — G8929 Other chronic pain: Secondary | ICD-10-CM | POA: Diagnosis not present

## 2021-04-02 DIAGNOSIS — I739 Peripheral vascular disease, unspecified: Secondary | ICD-10-CM | POA: Diagnosis not present

## 2021-04-02 DIAGNOSIS — G894 Chronic pain syndrome: Secondary | ICD-10-CM | POA: Diagnosis not present

## 2021-04-02 MED ORDER — HYDROCODONE-ACETAMINOPHEN 7.5-325 MG PO TABS: 1.0000 | ORAL_TABLET | Freq: Four times a day (QID) | ORAL | 0 refills | Status: DC | PRN

## 2021-04-02 MED ORDER — PREDNISONE 20 MG PO TABS: 40.0000 mg | ORAL_TABLET | Freq: Every day | ORAL | 0 refills | Status: AC

## 2021-04-02 NOTE — Assessment & Plan Note (Signed)
We will obtain lipid panel today, LDL goal is less than 70 with this and CAD.  He is currently on Lipitor 80 mg daily.  Last lipid panel was at goal in March 2021.

## 2021-04-02 NOTE — Progress Notes (Signed)
SUBJECTIVE:   CHIEF COMPLAINT / HPI:   Congestion Getting more congestion Coughing up mucus No fevers Started about 1.5 weeks ago No worsening of shortness of breath Currently smoking <1 ppd Using albuterol more frequently, still using incruse  Hx COPD and Emphysema  Chronic pain Current regimen: Norco 7.5-325 mg every 6 hours as needed Doing well with this dose, cannot switch back to Oxy because of his wife's concerns History of cervical spinal fusion, lumbar spinal stenosis, bilateral knee osteoarthritis Medication storage is safe Problems with cravings to take more: none Functionality with the medication vs. Without: he would be unable to sit without pain, reports "I can carry on with my day to day, but it doesn't completely erase my pain Last UDS 06/14/2020 Has narcan  HFpEF Last echo 11/2015, EF 55 to 60% at that time, G1 DD Current regimen: Losartan 100 mg Has chronic shortness of breath, denies chest pain  Hypertension Current regimen: Norvasc 5 mg daily, losartan 100 mg daily BP at home occasionally Last BMP 02/16/2020, creatinine 0.74 at that time  HLD and CAD, PVD Current regimen: Lipitor 80 mg daily Last lipid panel 02/16/2020, LDL 72 at that time He has an LDL goal of less than 70 given his history of CAD  Recent loss Has lost two more family members recently [employer] Visit from 04/02/2021 in [place]  PHQ-9 Total Score 12    Denies SI  COVID booster: would like one today  PERTINENT  PMH / PSH: COPD, peripheral vascular disease, HFpEF, CAD, GERD, OA, chronic narcotic use  OBJECTIVE:   BP 136/82   Pulse (!) 101   Ht 5\' 8"  (1.727 m)   Wt 269 lb 12.8 oz (122.4 kg)   SpO2 94%   BMI 41.02 kg/m    Physical Exam:  General: 65 y.o. male in NAD Neck: supple, no cervical LAD Cardio: RRR no m/r/g Lungs: CTAB, no wheezing, no rhonchi, no crackles, no IWOB on RA Abdomen: Soft, non-tender to palpation, non-distended,  positive bowel sounds Skin: warm and dry Extremities: No edema, antalgic gait with cane   ASSESSMENT/PLAN:   COPD exacerbation (HCC) Lungs sound clear.  No need for chest x-ray at this time.  Given his symptoms, we will go ahead and treat for COPD exacerbation.  No need for antibiotics at this time.  We will treat with prednisone 40 mg x 5 days.  Return to care if no improvement in symptoms.  Continue current inhalers.  Diastolic CHF (HCC) Chronic and stable.  No echo since 2016.  Patient reports that he cannot be seen by multiple cardiology practices in the area, he will work to try to find one that he can be seen at.  He will let us know if he needs a referral.  Continue current medications.  He is not fluid overloaded on exam.  Peripheral vascular disease We will obtain lipid panel today, LDL goal is less than 70 with this and CAD.  He is currently on Lipitor 80 mg daily.  Last lipid panel was at goal in March 2021.  HYPERTENSION, BENIGN BP near goal today.  Continue with current regimen.  Obtain BMP today.  Chronic pain syndrome PMP reviewed.  No red flags.  He is very safe with his medication.  No concern for medication overuse at this time.  MME 31.  Patient is appropriate to continue on this current regimen.  He is functional, continues to have some pain.  He understands  that the medication is to make him more functional and not to reduce his pain.  Overall, he has been very appropriate with this medication.  We will continue with his current refills.  For now, the benefits outweigh the risks.  Okay to refill for 3 months.  Norco sent to [facility] by mistake.  Called [facility] and discontinued this prescription.  Sent to [facility].   COVID booster given today.  Cleophas Dunker, [place]

## 2021-04-02 NOTE — Assessment & Plan Note (Addendum)
PMP reviewed.  No red flags.  He is very safe with his medication.  No concern for medication overuse at this time.  MME 31.  Patient is appropriate to continue on this current regimen.  He is functional, continues to have some pain.  He understands that the medication is to make him more functional and not to reduce his pain.  Overall, he has been very appropriate with this medication.  We will continue with his current refills.  For now, the benefits outweigh the risks.  Okay to refill for 3 months.  Norco sent to [facility] by mistake.  Called [facility] and discontinued this prescription.  Sent to [facility].

## 2021-04-02 NOTE — Assessment & Plan Note (Signed)
BP near goal today.  Continue with current regimen.  Obtain BMP today.

## 2021-04-02 NOTE — Patient Instructions (Signed)
Thank you for coming to see me today. It was a pleasure. Today we talked about:   We will get some labs today.  If they are abnormal or we need to do something about them, I will call you.  If they are normal, I will send you a message on MyChart (if it is active) or a letter in the mail.  If you don't hear from [place] in 2 weeks, please call the office at the number below.  We refilled your pain medication for 5/2.  Please follow-up with your new PCP in 3 months.  If you have any questions or concerns, please do not hesitate to call the office at [phone].  Best,   [place] Constable, DO

## 2021-04-02 NOTE — Assessment & Plan Note (Signed)
Lungs sound clear.  No need for chest x-ray at this time.  Given his symptoms, we will go ahead and treat for COPD exacerbation.  No need for antibiotics at this time.  We will treat with prednisone 40 mg x 5 days.  Return to care if no improvement in symptoms.  Continue current inhalers.

## 2021-04-02 NOTE — Assessment & Plan Note (Addendum)
Chronic and stable.  No echo since 2016.  Patient reports that he cannot be seen by multiple cardiology practices in the area, he will work to try to find one that he can be seen at.  He will let us know if he needs a referral.  Continue current medications.  He is not fluid overloaded on exam.

## 2021-04-03 ENCOUNTER — Encounter: Payer: Self-pay | Admitting: Family Medicine

## 2021-04-03 ENCOUNTER — Telehealth: Payer: Self-pay

## 2021-04-03 ENCOUNTER — Other Ambulatory Visit: Payer: Self-pay | Admitting: Family Medicine

## 2021-04-03 DIAGNOSIS — J441 Chronic obstructive pulmonary disease with (acute) exacerbation: Secondary | ICD-10-CM

## 2021-04-03 LAB — LIPID PANEL
Chol/HDL Ratio: 4.2 ratio (ref 0.0–5.0)
Cholesterol, Total: 146 mg/dL (ref 100–199)
HDL: 35 mg/dL — ABNORMAL LOW (ref >39)
LDL Chol Calc (NIH): 86 mg/dL (ref 0–99)
Triglycerides: 140 mg/dL (ref 0–149)
VLDL Cholesterol Cal: 25 mg/dL (ref 5–40)

## 2021-04-03 LAB — BASIC METABOLIC PANEL
BUN/Creatinine Ratio: 10 (ref 10–24)
BUN: 7 mg/dL — ABNORMAL LOW (ref 8–27)
CO2: 20 mmol/L (ref 20–29)
Calcium: 9.1 mg/dL (ref 8.6–10.2)
Chloride: 101 mmol/L (ref 96–106)
Creatinine, Ser: 0.71 mg/dL — ABNORMAL LOW (ref 0.76–1.27)
Glucose: 156 mg/dL — ABNORMAL HIGH (ref 65–99)
Potassium: 4.3 mmol/L (ref 3.5–5.2)
Sodium: 138 mmol/L (ref 134–144)
eGFR: 103 mL/min/{1.73_m2} (ref >59)

## 2021-04-03 MED ORDER — AMOXICILLIN-POT CLAVULANATE 875-125 MG PO TABS: 1.0000 | ORAL_TABLET | Freq: Two times a day (BID) | ORAL | 0 refills | Status: AC

## 2021-04-03 NOTE — Telephone Encounter (Signed)
Rx sent for augmentin x 5 days

## 2021-04-03 NOTE — Telephone Encounter (Signed)
Patient calls nurse line regarding no improvement from yesterday's visit. Patient is requesting antibiotic at this time. Advised patient that per office visit note, he is to use prednisone for 5 days and then return to care if no improvement. Also, that antibiotics were not indicated at the time of visit. States that steroid "has not helped at all" and would like to get abx before he would end up in the hospital.   Will send message to PCP for further instructions.   Talbot Grumbling, RN

## 2021-04-03 NOTE — Progress Notes (Signed)
Antibiotics for COPD exacerbation

## 2021-04-04 ENCOUNTER — Encounter: Payer: Self-pay | Admitting: Family Medicine

## 2021-04-04 ENCOUNTER — Other Ambulatory Visit: Payer: Self-pay | Admitting: Family Medicine

## 2021-04-04 DIAGNOSIS — R21 Rash and other nonspecific skin eruption: Secondary | ICD-10-CM

## 2021-04-04 DIAGNOSIS — E785 Hyperlipidemia, unspecified: Secondary | ICD-10-CM

## 2021-04-04 MED ORDER — EZETIMIBE 10 MG PO TABS: 10.0000 mg | ORAL_TABLET | Freq: Every day | ORAL | 3 refills | Status: DC

## 2021-05-02 ENCOUNTER — Encounter: Payer: Self-pay | Admitting: Family Medicine

## 2021-05-03 ENCOUNTER — Other Ambulatory Visit: Payer: Self-pay | Admitting: Family Medicine

## 2021-05-03 DIAGNOSIS — M5442 Lumbago with sciatica, left side: Secondary | ICD-10-CM

## 2021-05-03 DIAGNOSIS — G894 Chronic pain syndrome: Secondary | ICD-10-CM

## 2021-05-03 DIAGNOSIS — G8929 Other chronic pain: Secondary | ICD-10-CM

## 2021-05-03 MED ORDER — HYDROCODONE-ACETAMINOPHEN 7.5-325 MG PO TABS: 1.0000 | ORAL_TABLET | Freq: Four times a day (QID) | ORAL | 0 refills | Status: DC | PRN

## 2021-05-05 ENCOUNTER — Telehealth: Payer: Self-pay | Admitting: Family Medicine

## 2021-05-05 NOTE — Telephone Encounter (Signed)
**  After Hours/ Emergency Line Call**  Received a call to report that George Leon that everything had been taken care of.  He had called pharmacy and was unable to get his medication refilled for his pain medication that Dr Jeannine Kitten had recently prescribed.  He reports that there was an issue with DEA number and somehow it is now all corrected and he is getting his refill today. He was happy that I had returned call and was very pleasant that this was taken care of by [facility]. Will forward to PCP.  Carollee Leitz MD PGY-2, [facility] 05/05/2021 2:33 PM

## 2021-05-28 ENCOUNTER — Encounter: Payer: Self-pay | Admitting: Family Medicine

## 2021-05-29 ENCOUNTER — Other Ambulatory Visit: Payer: Self-pay | Admitting: Family Medicine

## 2021-05-29 DIAGNOSIS — I739 Peripheral vascular disease, unspecified: Secondary | ICD-10-CM

## 2021-05-29 DIAGNOSIS — G894 Chronic pain syndrome: Secondary | ICD-10-CM

## 2021-05-29 DIAGNOSIS — G8929 Other chronic pain: Secondary | ICD-10-CM

## 2021-05-29 DIAGNOSIS — I5032 Chronic diastolic (congestive) heart failure: Secondary | ICD-10-CM

## 2021-05-29 MED ORDER — HYDROCODONE-ACETAMINOPHEN 7.5-325 MG PO TABS: 1.0000 | ORAL_TABLET | Freq: Four times a day (QID) | ORAL | 0 refills | Status: DC | PRN

## 2021-05-29 NOTE — Progress Notes (Signed)
History of HFpEF and needs to re-establish with cardiology.

## 2021-05-29 NOTE — Progress Notes (Signed)
Refill for chronic pain medication.  PMP reviewed, no red flags.

## 2021-06-25 ENCOUNTER — Encounter: Payer: Self-pay | Admitting: Family Medicine

## 2021-06-25 DIAGNOSIS — G894 Chronic pain syndrome: Secondary | ICD-10-CM

## 2021-06-25 DIAGNOSIS — Z87891 Personal history of nicotine dependence: Secondary | ICD-10-CM

## 2021-06-25 DIAGNOSIS — G8929 Other chronic pain: Secondary | ICD-10-CM

## 2021-06-29 NOTE — Progress Notes (Signed)
Date:  07/02/2021   ID:  FILEMON GIMLIN, DOB [birth year], MRN [id number]  PCP:  Alen Bleacher, MD  Cardiologist:  Rex Kras, DO, [facility] (established care 07/02/2021)  REASON FOR CONSULT: Chronic diastolic congestive heart failure and peripheral vascular disease  REQUESTING PHYSICIAN:  Alen Bleacher, MD [address] [place],  [place] [address]  Chief Complaint  Patient presents with   Shortness of Breath    HPI  George Leon is a 65 y.o. male who presents to the office with a chief complaint of " shortness of breath." Patient's past medical history and cardiovascular risk factors include:hypertension, peripheral vascular disease (per patient), hyperlipidemia, obesity due to excess calories, tobacco use, COPD.  He is referred to the office at the request of Alen Bleacher, MD for evaluation of chronic diastolic congestive heart failure & peripheral vascular disease.  Patient states that his wife's insurance plan as an option for enrolling into a diet program as long as he is cleared by cardiology to increase physical activity; however, patient states that given his progressive shortness of breath he needed additional cardiovascular evaluation prior to proceeding.  Shortness of breath: Present for the last several years due to both cardiac and noncardiac conditions as noted above but over the last couple months has been getting progressively worse.  Patient states that his shortness of breath with effort related activity has gotten significantly worse to the point he has to take frequent breaks, feels tired and extremely exhausted.  Patient states that he has underlying COPD and continues to smoke cigarettes which may also be affecting his underlying shortness of breath but given the progressive nature he would like additional evaluation.  He he is uses inhalers more frequently without any symptom relief.  Patient states that he was diagnosed with congestive heart failure back in 2016.   However does not recall being established by cardiology or being on GDMT.  No prior hospitalizations for congestive heart failure as per his memory.  In addition, over the last 2 or 3 weeks he is also experiencing lower extremity swelling which is new compared to before.  And he sleeps in recliner for majority of the night due to inability to lay flat and also shortness of breath.  FUNCTIONAL STATUS: No structured exercise program or daily routine.   ALLERGIES: Allergies  Allergen Reactions   Sulfamethoxazole Anaphylaxis and Swelling    Tongue Swelling   Adhesive [Tape] Rash    Paper tape is what bothers him Pulled skin off also   Hydrochlorothiazide Other (See Comments)    Back Pain & gives him kidney stones    MEDICATION LIST PRIOR TO VISIT: Current Meds  Medication Sig   albuterol (PROAIR HFA) 108 (90 Base) MCG/ACT inhaler Inhale 1-2 puffs into the lungs every 4 (four) hours as needed. As needed for shortness of breath /wheeze/cough   amLODipine (NORVASC) 5 MG tablet TAKE 1 TABLET DAILY   atorvastatin (LIPITOR) 80 MG tablet TAKE 1 TABLET DAILY   DULoxetine (CYMBALTA) 60 MG capsule TAKE 1 CAPSULE DAILY   esomeprazole (NEXIUM) 40 MG capsule TAKE 1 CAPSULE DAILY AT 12 NOON   ezetimibe (ZETIA) 10 MG tablet Take 1 tablet (10 mg total) by mouth daily.   HYDROcodone-acetaminophen (NORCO) 7.5-325 MG tablet Take 1 tablet by mouth every 6 (six) hours as needed for moderate pain.   losartan (COZAAR) 100 MG tablet TAKE 1 TABLET AT BEDTIME   Multiple Vitamins-Minerals (OCUVITE ADULT FORMULA) CAPS Take 1 capsule by mouth daily.  naloxone (NARCAN) 0.4 MG/ML injection Use as needed for signs of overdose.   nicotine ([employer] NICOTINE) 21 mg/24hr patch PLACE 1 PATCH ONTO SKIN DAILY   nystatin cream (MYCOSTATIN) APPLY TOPICALLY TWICE A DAY   selenium sulfide (SELSUN) 2.5 % shampoo Apply 1 application topically daily as needed for irritation.   sodium chloride (OCEAN) 0.65 % SOLN nasal spray Place 1  spray into both nostrils as needed for congestion.   umeclidinium bromide (INCRUSE ELLIPTA) 62.5 MCG/INH AEPB Inhale 1 puff into the lungs daily.     PAST MEDICAL HISTORY: Past Medical History:  Diagnosis Date   Acute MI ([place])    Acute on chronic diastolic heart failure ([place]) 12/01/2015   Anxiety    Arthritis    "qwhere" (08/06/2017)   CHF (congestive heart failure) ([place]) dx'd 2016   Chronic back pain    "all my back" (08/06/2017)   Chronic bronchitis ([facility])    Chronic pain    Chronic pain syndrome 12/01/2015   COPD (chronic obstructive pulmonary disease) ([facility])    Dyspnea    w/ exertion    Emphysema of lung ([facility])    High cholesterol    History of hiatal hernia    possible     Hypertension    Liver fatty degeneration    Macular degeneration, right eye    Pneumonia  1990s X 1; 2016    PAST SURGICAL HISTORY: Past Surgical History:  Procedure Laterality Date   ANTERIOR CERVICAL DECOMP/DISCECTOMY FUSION  1988   "plate; cadavar bone; couple bolts"   BACK SURGERY     CARDIAC CATHETERIZATION     JOINT REPLACEMENT     KNEE ARTHROSCOPY Left 05/27/2017   Procedure: LEFT KNEE ARTHROSCOPY WITH PARTIAL MEDIAL MENISCECTOMY;  Surgeon: Mcarthur Rossetti, MD;  Location: [facility];  Service: Orthopedics;  Laterality: Left;   SHOULDER ARTHROSCOPY WITH ROTATOR CUFF REPAIR Right    TOTAL HIP ARTHROPLASTY Left 08/05/2017   Procedure: LEFT TOTAL HIP ARTHROPLASTY ANTERIOR APPROACH;  Surgeon: Mcarthur Rossetti, MD;  Location: [facility];  Service: Orthopedics;  Laterality: Left;    FAMILY HISTORY: The patient family history includes AAA (abdominal aortic aneurysm) in his father; Hypertension in his father.  SOCIAL HISTORY:  The patient  reports that he has been smoking cigarettes. He has a 45.00 pack-year smoking history. He has quit using smokeless tobacco.  His smokeless tobacco use included chew. He reports current alcohol use. He reports current drug use. Drug: Marijuana.  REVIEW OF  SYSTEMS: Review of Systems  Constitutional: Positive for malaise/fatigue. Negative for chills and fever.  HENT:  Negative for hoarse voice and nosebleeds.   Eyes:  Negative for discharge, double vision and pain.  Cardiovascular:  Positive for dyspnea on exertion, leg swelling and orthopnea. Negative for chest pain, claudication, near-syncope, palpitations, paroxysmal nocturnal dyspnea and syncope.  Respiratory:  Positive for shortness of breath. Negative for hemoptysis.   Musculoskeletal:  Negative for muscle cramps and myalgias.  Gastrointestinal:  Negative for abdominal pain, constipation, diarrhea, hematemesis, hematochezia, melena, nausea and vomiting.  Neurological:  Negative for dizziness and light-headedness.   PHYSICAL EXAM: Vitals with BMI 07/02/2021 04/02/2021 01/15/2021  Height '5\' 8"'$  '5\' 8"'$  '5\' 8"'$   Weight 272 lbs 269 lbs 13 oz 267 lbs 6 oz  BMI 41.37 0000000 123456  Systolic [id number]  Diastolic 69 82 60  Pulse 40 101 100    CONSTITUTIONAL: Appears older than stated age, hemodynamically stable, no acute distress. SKIN: Skin is warm and dry. No  rash noted. No cyanosis. No pallor. No jaundice HEAD: Normocephalic and atraumatic.  EYES: No scleral icterus MOUTH/THROAT: Moist oral membranes.  NECK: No JVD present. No thyromegaly noted. No carotid bruits  LYMPHATIC: No visible cervical adenopathy.  CHEST Normal respiratory effort. No intercostal retractions  LUNGS: Decreased breath sounds bilaterally with expiratory wheezes present.  No stridor. No rales.  CARDIOVASCULAR: Regular, positive S1-S2, no murmurs rubs or gallops appreciated. ABDOMINAL: Obese, soft, nontender, nondistended, positive bowel sounds in all 4 quadrants, no apparent ascites.  EXTREMITIES: +1 bilateral peripheral edema, compression stocks present, very faint and DP PT pulses bilaterally. HEMATOLOGIC: No significant bruising NEUROLOGIC: Oriented to person, place, and time. Nonfocal. Normal muscle tone.   PSYCHIATRIC: Normal mood and affect. Normal behavior. Cooperative  CARDIAC DATABASE: EKG: 07/02/2021: Normal sinus rhythm, 62 bpm, left axis, left anterior fascicular block, right bundle branch block, second-degree type II AV block, without underlying injury pattern.  07/02/2021: Sinus bradycardia, 54 bpm, right bundle branch block, left axis, left anterior fascicular block, second-degree type II AV block with ventricular escape beats.  Echocardiogram: 11/26/2015: LVEF 55 to 60%, no regional wall motion abnormalities, grade 1 diastolic impairment, elevated LVEDP, mildly dilated right atrium, moderate TR, PASP 48 mmHg.  Stress Testing: No results found for this or any previous visit from the past 1095 days.  Heart Catheterization: None  LABORATORY DATA: CBC Latest Ref Rng & Units 10/08/2017 08/06/2017 07/28/2017  WBC 3.4 - 10.8 x10E3/uL 14.6(H) 15.2(H) 13.7(H)  Hemoglobin 13.0 - 17.7 g/dL 12.9(L) 11.5(L) 13.3  Hematocrit 37.5 - 51.0 % 38.3 37.0(L) 41.7  Platelets 150 - 379 x10E3/uL 316 246 268    CMP Latest Ref Rng & Units 04/02/2021 02/16/2020 08/06/2017  Glucose 65 - 99 mg/dL 156(H) 84 109(H)  BUN 8 - 27 mg/dL 7(L) 7(L) 10  Creatinine 0.76 - 1.27 mg/dL 0.71(L) 0.74(L) 0.79  Sodium 134 - 144 mmol/L 138 137 135  Potassium 3.5 - 5.2 mmol/L 4.3 4.2 3.9  Chloride 96 - 106 mmol/L 101 101 101  CO2 20 - 29 mmol/L '20 22 25  '$ Calcium 8.6 - 10.2 mg/dL 9.1 9.0 8.2(L)  Total Protein 6.5 - 8.1 g/dL - - -  Total Bilirubin 0.3 - 1.2 mg/dL - - -  Alkaline Phos 38 - 126 U/L - - -  AST 15 - 41 U/L - - -  ALT 17 - 63 U/L - - -    Lipid Panel  Lab Results  Component Value Date   CHOL 146 04/02/2021   HDL 35 (L) 04/02/2021   LDLCALC 86 04/02/2021   LDLDIRECT 85 07/22/2012   TRIG 140 04/02/2021   CHOLHDL 4.2 04/02/2021   No components found for: NTPROBNP No results for input(s): PROBNP in the last 8760 hours. No results for input(s): TSH in the last 8760 hours.  BMP Recent Labs     04/02/21 1120  NA 138  K 4.3  CL 101  CO2 20  GLUCOSE 156*  BUN 7*  CREATININE 0.71*  CALCIUM 9.1    HEMOGLOBIN A1C Lab Results  Component Value Date   HGBA1C 6.1 08/04/2017    IMPRESSION:    ICD-10-CM   1. Symptomatic bradycardia  R00.1     2. Shortness of breath  R06.02 EKG 12-Lead    3. 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 Second degree AV block, Mobitz type II  I44.1     5. Fatigue  R53.83     6.  Benign hypertension  I10     7. Hyperlipidemia  E78.5     8. Pulmonary emphysema, unspecified emphysema type ([place])  J43.9     9. Tobacco abuse  Z72.0        RECOMMENDATIONS: George Leon is a 65 y.o. male whose past medical history and cardiac risk factors include: hypertension, peripheral vascular disease (per patient), hyperlipidemia, obesity due to excess calories, tobacco use, COPD.  His shortness of breath is clearly multifactorial; however over the last several weeks has started to notice that the symptoms have been getting progressively worse with effort related activities, feeling extremely exhausted, generalized fatigue, and lower extremity swelling.  Initial vital signs note bradycardia and EKG notes sinus rhythm with second-degree type II AV block.  I had the patient ambulate in the office to evaluate his heart rate response to activity. Resting heart rate 39 bpm 95% on room air After 1 lap 52 bpm, 95% on room air After 2 laps 52 bpm, 95 percent on room air. After 3 laps 52 bpm, 94% on room air (needed to rest while walking) After 4 laps 45 bpm, 95% on room air (needed to rest while walking).   EKG after ambulating in the office continues to note sinus bradycardia with high degree AV block.   The shared decision was to go to the ER for further evaluation and management and be seen by cardiac electrophysiology for a more expedited evaluation as opposed to outpatient follow-up.  He also has underlying right  bundle branch block and left anterior fascicular block which predisposes him for worsening conduction disease.  He is not on any AV nodal blocking agents.  He will need additional work-up for reversible causes such as BMP, BNP, TSH, echocardiogram, and EP evaluation.  Recommended EMS transfer; however, patient refuses states that his wife who accompanies him today will take him to the hospital for further evaluation and management.  In addition, patient was also referred to the office for evaluation of peripheral vascular disease.  Will defer further management at this time given his acute symptoms of shortness of breath.  FINAL MEDICATION LIST END OF ENCOUNTER: No orders of the defined types were placed in this encounter.   Medications Discontinued During This Encounter  Medication Reason   fish oil-omega-3 fatty acids 1000 MG capsule Error     Current Outpatient Medications:    albuterol (PROAIR HFA) 108 (90 Base) MCG/ACT inhaler, Inhale 1-2 puffs into the lungs every 4 (four) hours as needed. As needed for shortness of breath /wheeze/cough, Disp: 18 g, Rfl: 1   amLODipine (NORVASC) 5 MG tablet, TAKE 1 TABLET DAILY, Disp: 90 tablet, Rfl: 3   atorvastatin (LIPITOR) 80 MG tablet, TAKE 1 TABLET DAILY, Disp: 90 tablet, Rfl: 3   DULoxetine (CYMBALTA) 60 MG capsule, TAKE 1 CAPSULE DAILY, Disp: 90 capsule, Rfl: 3   esomeprazole (NEXIUM) 40 MG capsule, TAKE 1 CAPSULE DAILY AT 12 NOON, Disp: 90 capsule, Rfl: 3   ezetimibe (ZETIA) 10 MG tablet, Take 1 tablet (10 mg total) by mouth daily., Disp: 90 tablet, Rfl: 3   HYDROcodone-acetaminophen (NORCO) 7.5-325 MG tablet, Take 1 tablet by mouth every 6 (six) hours as needed for moderate pain., Disp: 120 tablet, Rfl: 0   losartan (COZAAR) 100 MG tablet, TAKE 1 TABLET AT BEDTIME, Disp: 90 tablet, Rfl: 3   Multiple Vitamins-Minerals (OCUVITE ADULT FORMULA) CAPS, Take 1 capsule by mouth daily., Disp: 90 capsule, Rfl: 3   naloxone (NARCAN) 0.4 MG/ML  injection, Use  as needed for signs of overdose., Disp: 1 mL, Rfl: 0   nicotine ([employer] NICOTINE) 21 mg/24hr patch, PLACE 1 PATCH ONTO SKIN DAILY, Disp: 42 patch, Rfl: 2   nystatin cream (MYCOSTATIN), APPLY TOPICALLY TWICE A DAY, Disp: 30 g, Rfl: 23   selenium sulfide (SELSUN) 2.5 % shampoo, Apply 1 application topically daily as needed for irritation., Disp: 118 mL, Rfl: 2   sodium chloride (OCEAN) 0.65 % SOLN nasal spray, Place 1 spray into both nostrils as needed for congestion., Disp: , Rfl:    umeclidinium bromide (INCRUSE ELLIPTA) 62.5 MCG/INH AEPB, Inhale 1 puff into the lungs daily., Disp: 90 each, Rfl: 3   Elastic Bandages & Supports ([place]) MISC, 1 each by Does not apply route once., Disp: 2 each, Rfl: 0  Orders Placed This Encounter  Procedures   EKG 12-Lead    There are no Patient Instructions on file for this visit.   --Continue cardiac medications as reconciled in final medication list. --Return in about 3 weeks (around 07/23/2021) for Follow up, Dyspnea, posthospitalization. Or sooner if needed. --Continue follow-up with your primary care physician regarding the management of your other chronic comorbid conditions.  Patient's questions and concerns were addressed to his satisfaction. He voices understanding of the instructions provided during this encounter.   This note was created using a voice recognition software as a result there may be grammatical errors inadvertently enclosed that do not reflect the nature of this encounter. Every attempt is made to correct such errors.  Rex Kras, [place], [facility]  Pager: [phone] Office: [phone]

## 2021-06-30 ENCOUNTER — Encounter: Payer: Self-pay | Admitting: Family Medicine

## 2021-06-30 MED ORDER — HYDROCODONE-ACETAMINOPHEN 7.5-325 MG PO TABS: 1.0000 | ORAL_TABLET | Freq: Four times a day (QID) | ORAL | 0 refills | Status: DC | PRN

## 2021-06-30 MED ORDER — NICOTINE 21 MG/24HR TD PT24: MEDICATED_PATCH | TRANSDERMAL | 2 refills | Status: DC

## 2021-07-02 ENCOUNTER — Other Ambulatory Visit: Payer: Self-pay

## 2021-07-02 ENCOUNTER — Encounter: Payer: Self-pay | Admitting: Cardiology

## 2021-07-02 ENCOUNTER — Emergency Department (HOSPITAL_COMMUNITY): Payer: BC Managed Care – PPO

## 2021-07-02 ENCOUNTER — Inpatient Hospital Stay (HOSPITAL_COMMUNITY)
Admission: EM | Admit: 2021-07-02 | Discharge: 2021-07-05 | DRG: 242 | Disposition: A | Discharge status: Home / Self Care | Payer: BC Managed Care – PPO | Source: Ambulatory Visit | Attending: Family Medicine | Admitting: Family Medicine

## 2021-07-02 ENCOUNTER — Encounter (HOSPITAL_COMMUNITY): Payer: Self-pay | Admitting: Emergency Medicine

## 2021-07-02 ENCOUNTER — Ambulatory Visit: Payer: BC Managed Care – PPO | Admitting: Cardiology

## 2021-07-02 VITALS — BP 140/69 | HR 40 | Temp 97.6°F | Ht 68.0 in | Wt 272.0 lb

## 2021-07-02 DIAGNOSIS — R0609 Other forms of dyspnea: Secondary | ICD-10-CM | POA: Diagnosis not present

## 2021-07-02 DIAGNOSIS — Z8249 Family history of ischemic heart disease and other diseases of the circulatory system: Secondary | ICD-10-CM

## 2021-07-02 DIAGNOSIS — Z6841 Body Mass Index (BMI) 40.0 and over, adult: Secondary | ICD-10-CM | POA: Diagnosis not present

## 2021-07-02 DIAGNOSIS — E785 Hyperlipidemia, unspecified: Secondary | ICD-10-CM | POA: Diagnosis not present

## 2021-07-02 DIAGNOSIS — I2510 Atherosclerotic heart disease of native coronary artery without angina pectoris: Secondary | ICD-10-CM | POA: Diagnosis present

## 2021-07-02 DIAGNOSIS — I4439 Other atrioventricular block: Secondary | ICD-10-CM | POA: Diagnosis not present

## 2021-07-02 DIAGNOSIS — Z96642 Presence of left artificial hip joint: Secondary | ICD-10-CM | POA: Diagnosis present

## 2021-07-02 DIAGNOSIS — R5383 Other fatigue: Secondary | ICD-10-CM

## 2021-07-02 DIAGNOSIS — G629 Polyneuropathy, unspecified: Secondary | ICD-10-CM | POA: Diagnosis present

## 2021-07-02 DIAGNOSIS — F17210 Nicotine dependence, cigarettes, uncomplicated: Secondary | ICD-10-CM

## 2021-07-02 DIAGNOSIS — K219 Gastro-esophageal reflux disease without esophagitis: Secondary | ICD-10-CM | POA: Diagnosis present

## 2021-07-02 DIAGNOSIS — IMO0001 Reserved for inherently not codable concepts without codable children: Secondary | ICD-10-CM

## 2021-07-02 DIAGNOSIS — J439 Emphysema, unspecified: Secondary | ICD-10-CM

## 2021-07-02 DIAGNOSIS — Z45018 Encounter for adjustment and management of other part of cardiac pacemaker: Secondary | ICD-10-CM

## 2021-07-02 DIAGNOSIS — E782 Mixed hyperlipidemia: Secondary | ICD-10-CM | POA: Diagnosis not present

## 2021-07-02 DIAGNOSIS — Z888 Allergy status to other drugs, medicaments and biological substances status: Secondary | ICD-10-CM | POA: Diagnosis not present

## 2021-07-02 DIAGNOSIS — I5031 Acute diastolic (congestive) heart failure: Secondary | ICD-10-CM | POA: Diagnosis not present

## 2021-07-02 DIAGNOSIS — I442 Atrioventricular block, complete: Secondary | ICD-10-CM | POA: Diagnosis not present

## 2021-07-02 DIAGNOSIS — I5033 Acute on chronic diastolic (congestive) heart failure: Secondary | ICD-10-CM | POA: Diagnosis present

## 2021-07-02 DIAGNOSIS — Z7951 Long term (current) use of inhaled steroids: Secondary | ICD-10-CM | POA: Diagnosis not present

## 2021-07-02 DIAGNOSIS — I452 Bifascicular block: Secondary | ICD-10-CM | POA: Diagnosis not present

## 2021-07-02 DIAGNOSIS — I517 Cardiomegaly: Secondary | ICD-10-CM | POA: Diagnosis not present

## 2021-07-02 DIAGNOSIS — J449 Chronic obstructive pulmonary disease, unspecified: Secondary | ICD-10-CM

## 2021-07-02 DIAGNOSIS — I110 Hypertensive heart disease with heart failure: Secondary | ICD-10-CM | POA: Diagnosis present

## 2021-07-02 DIAGNOSIS — R001 Bradycardia, unspecified: Secondary | ICD-10-CM

## 2021-07-02 DIAGNOSIS — I440 Atrioventricular block, first degree: Secondary | ICD-10-CM | POA: Diagnosis not present

## 2021-07-02 DIAGNOSIS — G894 Chronic pain syndrome: Secondary | ICD-10-CM | POA: Diagnosis present

## 2021-07-02 DIAGNOSIS — I272 Pulmonary hypertension, unspecified: Secondary | ICD-10-CM | POA: Diagnosis present

## 2021-07-02 DIAGNOSIS — I509 Heart failure, unspecified: Secondary | ICD-10-CM

## 2021-07-02 DIAGNOSIS — Z91048 Other nonmedicinal substance allergy status: Secondary | ICD-10-CM

## 2021-07-02 DIAGNOSIS — I10 Essential (primary) hypertension: Secondary | ICD-10-CM

## 2021-07-02 DIAGNOSIS — Z981 Arthrodesis status: Secondary | ICD-10-CM | POA: Diagnosis not present

## 2021-07-02 DIAGNOSIS — R0602 Shortness of breath: Secondary | ICD-10-CM

## 2021-07-02 DIAGNOSIS — E7800 Pure hypercholesterolemia, unspecified: Secondary | ICD-10-CM | POA: Diagnosis present

## 2021-07-02 DIAGNOSIS — Z20822 Contact with and (suspected) exposure to covid-19: Secondary | ICD-10-CM | POA: Diagnosis present

## 2021-07-02 DIAGNOSIS — R0600 Dyspnea, unspecified: Secondary | ICD-10-CM

## 2021-07-02 DIAGNOSIS — I739 Peripheral vascular disease, unspecified: Secondary | ICD-10-CM | POA: Diagnosis present

## 2021-07-02 DIAGNOSIS — Z720 Tobacco use: Secondary | ICD-10-CM | POA: Diagnosis not present

## 2021-07-02 DIAGNOSIS — I441 Atrioventricular block, second degree: Secondary | ICD-10-CM | POA: Diagnosis not present

## 2021-07-02 DIAGNOSIS — Z950 Presence of cardiac pacemaker: Secondary | ICD-10-CM

## 2021-07-02 DIAGNOSIS — Z882 Allergy status to sulfonamides status: Secondary | ICD-10-CM

## 2021-07-02 DIAGNOSIS — Z79899 Other long term (current) drug therapy: Secondary | ICD-10-CM | POA: Diagnosis not present

## 2021-07-02 DIAGNOSIS — F17200 Nicotine dependence, unspecified, uncomplicated: Secondary | ICD-10-CM

## 2021-07-02 DIAGNOSIS — R079 Chest pain, unspecified: Secondary | ICD-10-CM | POA: Diagnosis not present

## 2021-07-02 HISTORY — DX: Atrioventricular block, complete: I44.2

## 2021-07-02 LAB — I-STAT CHEM 8, ED
BUN: 15 mg/dL (ref 8–23)
Calcium, Ion: 1.13 mmol/L — ABNORMAL LOW (ref 1.15–1.40)
Chloride: 105 mmol/L (ref 98–111)
Creatinine, Ser: 1 mg/dL (ref 0.61–1.24)
Glucose, Bld: 88 mg/dL (ref 70–99)
HCT: 39 % (ref 39.0–52.0)
Hemoglobin: 13.3 g/dL (ref 13.0–17.0)
Potassium: 3.8 mmol/L (ref 3.5–5.1)
Sodium: 137 mmol/L (ref 135–145)
TCO2: 22 mmol/L (ref 22–32)

## 2021-07-02 LAB — CBC
HCT: 43.4 % (ref 39.0–52.0)
Hemoglobin: 13.5 g/dL (ref 13.0–17.0)
MCH: 28.5 pg (ref 26.0–34.0)
MCHC: 31.1 g/dL (ref 30.0–36.0)
MCV: 91.8 fL (ref 80.0–100.0)
Platelets: 226 10*3/uL (ref 150–400)
RBC: 4.73 MIL/uL (ref 4.22–5.81)
RDW: 14.1 % (ref 11.5–15.5)
WBC: 18.3 10*3/uL — ABNORMAL HIGH (ref 4.0–10.5)
nRBC: 0 % (ref 0.0–0.2)

## 2021-07-02 LAB — COMPREHENSIVE METABOLIC PANEL
ALT: 39 U/L (ref 0–44)
AST: 24 U/L (ref 15–41)
Albumin: 3.4 g/dL — ABNORMAL LOW (ref 3.5–5.0)
Alkaline Phosphatase: 88 U/L (ref 38–126)
Anion gap: 7 (ref 5–15)
BUN: 14 mg/dL (ref 8–23)
CO2: 22 mmol/L (ref 22–32)
Calcium: 8.6 mg/dL — ABNORMAL LOW (ref 8.9–10.3)
Chloride: 107 mmol/L (ref 98–111)
Creatinine, Ser: 1.06 mg/dL (ref 0.61–1.24)
GFR, Estimated: >60 mL/min (ref >60)
Glucose, Bld: 91 mg/dL (ref 70–99)
Potassium: 3.8 mmol/L (ref 3.5–5.1)
Sodium: 136 mmol/L (ref 135–145)
Total Bilirubin: 0.6 mg/dL (ref 0.3–1.2)
Total Protein: 6.2 g/dL — ABNORMAL LOW (ref 6.5–8.1)

## 2021-07-02 LAB — TSH: TSH: 1.306 u[IU]/mL (ref 0.350–4.500)

## 2021-07-02 LAB — RESP PANEL BY RT-PCR (FLU A&B, COVID) ARPGX2
Influenza A by PCR: NEGATIVE
Influenza B by PCR: NEGATIVE
SARS Coronavirus 2 by RT PCR: NEGATIVE

## 2021-07-02 LAB — TROPONIN I (HIGH SENSITIVITY)
Troponin I (High Sensitivity): 14 ng/L (ref <18)
Troponin I (High Sensitivity): 12 ng/L (ref <18)

## 2021-07-02 LAB — MAGNESIUM: Magnesium: 2 mg/dL (ref 1.7–2.4)

## 2021-07-02 LAB — BRAIN NATRIURETIC PEPTIDE: B Natriuretic Peptide: 457 pg/mL — ABNORMAL HIGH (ref 0.0–100.0)

## 2021-07-02 MED ORDER — FUROSEMIDE 10 MG/ML IJ SOLN
40.0000 mg | Freq: Once | INTRAMUSCULAR | Status: AC
  Administered 2021-07-03: 40 mg via INTRAVENOUS
  Filled 2021-07-02: qty 4

## 2021-07-02 MED ORDER — NALOXONE HCL 0.4 MG/ML IJ SOLN: 0.2000 mg | INTRAMUSCULAR | Status: DC | PRN

## 2021-07-02 MED ORDER — HYDROCODONE-ACETAMINOPHEN 7.5-325 MG PO TABS
1.0000 | ORAL_TABLET | Freq: Four times a day (QID) | ORAL | Status: DC | PRN
  Administered 2021-07-03 – 2021-07-05 (×9): 1 via ORAL
  Filled 2021-07-02 (×9): qty 1

## 2021-07-02 MED ORDER — UMECLIDINIUM BROMIDE 62.5 MCG/INH IN AEPB
1.0000 | INHALATION_SPRAY | Freq: Every day | RESPIRATORY_TRACT | Status: DC
  Administered 2021-07-03 – 2021-07-05 (×3): 1 via RESPIRATORY_TRACT
  Filled 2021-07-02: qty 7

## 2021-07-02 MED ORDER — DULOXETINE HCL 60 MG PO CPEP
60.0000 mg | ORAL_CAPSULE | Freq: Every day | ORAL | Status: DC
  Administered 2021-07-04 – 2021-07-05 (×2): 60 mg via ORAL
  Filled 2021-07-02 (×2): qty 1

## 2021-07-02 MED ORDER — EZETIMIBE 10 MG PO TABS
10.0000 mg | ORAL_TABLET | Freq: Every day | ORAL | Status: DC
  Administered 2021-07-04 – 2021-07-05 (×2): 10 mg via ORAL
  Filled 2021-07-02 (×2): qty 1

## 2021-07-02 MED ORDER — ALBUTEROL SULFATE (2.5 MG/3ML) 0.083% IN NEBU
2.5000 mg | INHALATION_SOLUTION | RESPIRATORY_TRACT | Status: DC | PRN
  Administered 2021-07-04: 2.5 mg via RESPIRATORY_TRACT
  Filled 2021-07-02 (×2): qty 3

## 2021-07-02 MED ORDER — SALINE SPRAY 0.65 % NA SOLN: 1.0000 | NASAL | Status: DC | PRN

## 2021-07-02 MED ORDER — NICOTINE 21 MG/24HR TD PT24
21.0000 mg | MEDICATED_PATCH | Freq: Every day | TRANSDERMAL | Status: DC
  Administered 2021-07-03 – 2021-07-05 (×3): 21 mg via TRANSDERMAL
  Filled 2021-07-02 (×4): qty 1

## 2021-07-02 MED ORDER — ATORVASTATIN CALCIUM 80 MG PO TABS
80.0000 mg | ORAL_TABLET | Freq: Every day | ORAL | Status: DC
  Administered 2021-07-03 – 2021-07-05 (×3): 80 mg via ORAL
  Filled 2021-07-02: qty 1
  Filled 2021-07-02: qty 2
  Filled 2021-07-02: qty 1

## 2021-07-02 MED ORDER — PANTOPRAZOLE SODIUM 40 MG PO TBEC
40.0000 mg | DELAYED_RELEASE_TABLET | Freq: Every day | ORAL | Status: DC
  Administered 2021-07-04: 40 mg via ORAL
  Filled 2021-07-02 (×3): qty 1

## 2021-07-02 NOTE — ED Provider Notes (Signed)
[facility] EMERGENCY DEPARTMENT Provider Note   CSN: [id number] Arrival date & time: 07/02/21  1702     History Chief Complaint  Patient presents with   Shortness of Breath    George Leon is a 65 y.o. male hx of MI, diastolic heart failure, COPD here presenting with bradycardia.  Patient states that he has been feeling short of breath and tired and dizzy.  Patient went to see cardiology (Dr. Terri Skains) today and was noted to be in heart block.  Patient was sent here for admission.  Patient states that this is the first time he saw his cardiologist.  Patient follows up with family practice clinic.  The history is provided by the patient.      Past Medical History:  Diagnosis Date   Acute MI ([place])    Acute on chronic diastolic heart failure ([place]) 12/01/2015   Anxiety    Arthritis    "qwhere" (08/06/2017)   CHF (congestive heart failure) ([place]) dx'd 2016   Chronic back pain    "all my back" (08/06/2017)   Chronic bronchitis (H[facility])    Chronic pain    Chronic pain syndrome 12/01/2015   COPD (chronic obstructive pulmonary disease) (H[facility])    Dyspnea    w/ exertion    Emphysema of lung (HCC)    High cholesterol    History of hiatal hernia    possible     Hypertension    Liver fatty degeneration    Macular degeneration, right eye    Pneumonia  1990s X 1; 2016    Patient Active Problem List   Diagnosis Date Noted   COPD exacerbation ([place]) 04/02/2021   Right knee pain 01/15/2021   Right shoulder pain 01/15/2021   Insomnia 08/07/2020   Chronic narcotic use 06/15/2020   Scalp irritation 08/17/2019   Facet arthritis of lumbar region 02/19/2018   Spinal stenosis of lumbar region without neurogenic claudication 02/19/2018   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   Anemia 05/12/2017   Other tear of medial meniscus, current injury, left knee, subsequent encounter 04/21/2017   Chronic pain of left knee 04/21/2017   Chronic back pain 02/12/2017    Pulmonary hypertension ([place]) 09/13/2016   Hyperlipidemia 09/13/2016   Cervical neck pain with evidence of disc disease 01/18/2016   Rash and nonspecific skin eruption 12/19/2015   Chronic pain syndrome 12/01/2015   Tobacco abuse    Coronary artery disease involving native coronary artery of native heart without angina pectoris    Chronic venous insufficiency [id number]   Diastolic CHF ([place]) [id number]   Proteinuria 05/22/2015   Macular degeneration, 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   Osteoarthritis of left knee 12/28/2014   Osteoarthritis of right knee 12/28/2014   Peripheral vascular disease ([place]) 08/23/2013   Disorder of SI (sacroiliac) joint 03/30/2013   GANGLION CYST, WRIST, LEFT 11/16/2010   Obstructive chronic bronchitis without exacerbation ([place]) 10/26/2010   Osteoarthritis of multiple joints 08/25/2008   OBESITY, MORBID 05/26/2008   GERD 06/19/2007   HYPERTENSION, BENIGN 02/16/2007    Past Surgical History:  Procedure Laterality Date   ANTERIOR CERVICAL DECOMP/DISCECTOMY FUSION  1988   "plate; cadavar bone; couple bolts"   BACK SURGERY     CARDIAC CATHETERIZATION     JOINT REPLACEMENT     KNEE ARTHROSCOPY Left 05/27/2017   Procedure: LEFT KNEE ARTHROSCOPY WITH PARTIAL MEDIAL MENISCECTOMY;  Surgeon: Mcarthur Rossetti, MD;  Location: [place];  Service: Orthopedics;  Laterality: Left;   SHOULDER ARTHROSCOPY WITH  ROTATOR CUFF REPAIR Right    TOTAL HIP ARTHROPLASTY Left 08/05/2017   Procedure: LEFT TOTAL HIP ARTHROPLASTY ANTERIOR APPROACH;  Surgeon: Mcarthur Rossetti, MD;  Location: [place];  Service: Orthopedics;  Laterality: Left;       Family History  Problem Relation Age of Onset   Hypertension Father    AAA (abdominal aortic aneurysm) Father     Social History   Tobacco Use   Smoking status: Every Day    Packs/day: 1.00    Years: 45.00    Pack years: 45.00    Types: Cigarettes   Smokeless tobacco: Former    Types: Nurse, children's Use: Never used   Substance Use Topics   Alcohol use: Yes    Comment: 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 Authorizing Provider  albuterol (PROAIR HFA) 108 (90 Base) MCG/ACT inhaler Inhale 1-2 puffs into the lungs every 4 (four) hours as needed. As needed for shortness of breath /wheeze/cough 03/26/21   Meccariello, Bernita Raisin, DO  amLODipine (NORVASC) 5 MG tablet TAKE 1 TABLET DAILY 09/19/20   Meccariello, Bernita Raisin, DO  atorvastatin (LIPITOR) 80 MG tablet TAKE 1 TABLET DAILY 09/19/20   Meccariello, Bernita Raisin, DO  DULoxetine (CYMBALTA) 60 MG capsule TAKE 1 CAPSULE DAILY 09/19/20   Meccariello, Bernita Raisin, DO  Elastic Bandages & Supports ([place]) [place] 1 each by Does not apply route once. 06/19/15   Patrecia Pour, MD  esomeprazole (NEXIUM) 40 MG capsule TAKE 1 CAPSULE DAILY AT 12 NOON 09/19/20   Meccariello, Bernita Raisin, DO  ezetimibe (ZETIA) 10 MG tablet Take 1 tablet (10 mg total) by mouth daily. 04/04/21   Meccariello, Bernita Raisin, DO  HYDROcodone-acetaminophen (NORCO) 7.5-325 MG tablet Take 1 tablet by mouth every 6 (six) hours as needed for moderate pain. 06/30/21   Alen Bleacher, MD  losartan (COZAAR) 100 MG tablet TAKE 1 TABLET AT BEDTIME 08/21/20   Meccariello, Bernita Raisin, DO  Multiple Vitamins-Minerals (OCUVITE ADULT FORMULA) CAPS Take 1 capsule by mouth daily. 05/22/15   Patrecia Pour, MD  naloxone [facility]) 0.4 MG/ML injection Use as needed for signs of overdose. 01/16/21   Meccariello, Bernita Raisin, DO  nicotine ([facility] NICOTINE) 21 mg/24hr patch PLACE 1 PATCH ONTO SKIN DAILY 06/30/21   Alen Bleacher, MD  nystatin cream (MYCOSTATIN) APPLY TOPICALLY TWICE A DAY 04/05/21   Meccariello, Bernita Raisin, DO  selenium sulfide (SELSUN) 2.5 % shampoo Apply 1 application topically daily as needed for irritation.  01/15/21   Meccariello, Bernita Raisin, DO  sodium chloride (OCEAN) 0.65 % SOLN nasal spray Place 1 spray into both nostrils as needed for congestion.    [provider]  umeclidinium bromide (INCRUSE ELLIPTA) 62.5 MCG/INH AEPB Inhale 1 puff into the lungs daily. 07/04/20   Meccariello, Bernita Raisin, DO    Allergies    Sulfamethoxazole, Adhesive [tape], and Hydrochlorothiazide  Review of Systems   Review of Systems  Respiratory:  Positive for shortness of breath.   Neurological:  Positive for dizziness.  All other systems reviewed and are negative.  Physical Exam Updated Vital Signs BP (!) 134/99   Pulse (!) 50   Resp 18   SpO2 95%   Physical Exam Vitals  and nursing note reviewed.  Constitutional:      Comments: Chronically ill  HENT:     Head: Normocephalic.  Eyes:     Extraocular Movements: Extraocular movements intact.     Pupils: Pupils are equal, round, and reactive to light.  Cardiovascular:     Rate and Rhythm: Regular rhythm. Bradycardia present.  Pulmonary:     Comments: Crackles on bilateral bases Abdominal:     General: Bowel sounds are normal.     Palpations: Abdomen is soft.  Musculoskeletal:     Cervical back: Normal range of motion.     Comments: 1+ edema   Skin:    General: Skin is warm.     Capillary Refill: Capillary refill takes less than 2 seconds.  Neurological:     General: No focal deficit present.     Mental Status: He is alert and oriented to person, place, and time.  Psychiatric:        Mood and Affect: Mood normal.        Behavior: Behavior normal.    ED Results / Procedures / Treatments   Labs (all labs ordered are listed, but only abnormal results are displayed) Labs Reviewed  CBC - Abnormal; Notable for the following components:      Result Value   WBC 18.3 (*)    All other components within normal limits  COMPREHENSIVE METABOLIC PANEL - Abnormal; Notable for the following components:   Calcium 8.6 (*)    Total Protein 6.2 (*)     Albumin 3.4 (*)    All other components within normal limits  BRAIN NATRIURETIC PEPTIDE - Abnormal; Notable for the following components:   B Natriuretic Peptide 457.0 (*)    All other components within normal limits  I-STAT CHEM 8, ED - Abnormal; Notable for the following components:   Calcium, Ion 1.13 (*)    All other components within normal limits  RESP PANEL BY RT-PCR (FLU A&B, COVID) ARPGX2  MAGNESIUM  TSH  TROPONIN I (HIGH SENSITIVITY)  TROPONIN I (HIGH SENSITIVITY)    EKG EKG Interpretation  Date/Time:  Monday July 02 2021 19:02:46 EDT Ventricular Rate:  45 PR Interval:    QRS Duration: 148 QT Interval:  498 QTC Calculation: 430 R Axis:   -58 Text Interpretation: Marked sinus bradycardia with A-V dissociation and Wide QRS rhythm with Premature supraventricular complexes Right bundle branch block Left anterior fascicular block Minimal voltage criteria for LVH, may be normal variant ( R in aVL ) Abnormal ECG third degree vs high second degree heart block Confirmed by Wandra Arthurs [phone]) on 07/02/2021 7:22:01 PM   EKG Interpretation  Date/Time:  Monday July 02 2021 19:28:18 EDT Ventricular Rate:  40 PR Interval:  59 QRS Duration: 167 QT Interval:  577 QTC Calculation: 471 R Axis:   -72 Text Interpretation: Sinus rhythm Supraventricular bigeminy Short PR interval Consider right atrial enlargement RBBB and LAFB Probable left ventricular hypertrophy Third degree heart block Confirmed by Wandra Arthurs [phone]) on 07/02/2021 8:08:29 PM         Radiology DG Chest Portable 1 View  Result Date: 07/02/2021 CLINICAL DATA:  65 year old male with chest pain. EXAM: PORTABLE CHEST 1 VIEW COMPARISON:  Chest radiograph dated 12/06/2015 and CT dated 11/28/2016. FINDINGS: No focal consolidation, pleural effusion, pneumothorax. There is mild cardiomegaly with mild central vascular congestion. No acute osseous pathology. Lower cervical ACDF. IMPRESSION: Mild cardiomegaly with mild  central vascular congestion. No focal consolidation. Electronically Signed   By:  Anner Crete M.D.   On: 07/02/2021 19:53    Procedures Procedures  CRITICAL CARE Performed by: Wandra Arthurs   Total critical care time: 30 minutes  Critical care time was exclusive of separately billable procedures and treating other patients.  Critical care was necessary to treat or prevent imminent or life-threatening deterioration.  Critical care was time spent personally by me on the following activities: development of treatment plan with patient and/or surrogate as well as nursing, discussions with consultants, evaluation of patient's response to treatment, examination of patient, obtaining history from patient or surrogate, ordering and performing treatments and interventions, ordering and review of laboratory studies, ordering and review of radiographic studies, pulse oximetry and re-evaluation of patient's condition.   Medications Ordered in ED Medications  furosemide (LASIX) injection 40 mg (has no administration in time range)    ED Course  I have reviewed the triage vital signs and the nursing notes.  Pertinent labs & imaging results that were available during my care of the patient were reviewed by me and considered in my medical decision making (see chart for details).    MDM Rules/Calculators/A&P                          SAGAR OREA is a 65 y.o. male here with dizziness and bradycardia. HR in the 40s. Initial EKG showed high second degree heart block. Repeat EKG showed 3rd degree heart block. BP normal.  I talked to Dr. Terri Skains.  He recommend getting electrolytes and BNP and TSH.  He recommended family practice admission and he will consult.  He recommend n.p.o. after midnight and he will talk to EP in the morning.    9:48 PM Patient has elevated BNP.  Given Lasix for diuresis.  Anticipate getting a pacemaker tomorrow.  Family practice to admit  Final Clinical Impression(s) / ED  Diagnoses Final diagnoses:  None    Rx / DC Orders ED Discharge Orders     None        Drenda Freeze, MD 07/02/21 2241

## 2021-07-02 NOTE — ED Triage Notes (Signed)
Pt sent by cardiologist, c/o shortness of breath, chest pain and bradycardia.

## 2021-07-02 NOTE — ED Provider Notes (Signed)
Emergency Medicine Provider Triage Evaluation Note  ATZEL NY , a 65 y.o. male  was evaluated in triage.  Pt complains of shortness of breath.  Patient was seen by cardiology today and referred here for electrophysiology, most likely needs pacemaker at this time for symptomatic bradycardia.Pt is SOB and having CP  Review of Systems  Positive: Shortness of breath, chest pain  Negative:   Physical Exam  Pulse (!) 40   Resp 20   SpO2 94%  Gen:   Awake, no distress   Resp:  Normal effort  MSK:   Moves extremities without difficulty  Other:  Bradycardia to 40, normal mental status. Medical Decision Making  Medically screening exam initiated at 7:02 PM.  Appropriate orders placed.  BYRL WINT was informed that the remainder of the evaluation will be completed by another provider, this initial triage assessment does not replace that evaluation, and the importance of remaining in the ED until their evaluation is complete  EKG with third degree heart block, pt to be next, triage nurse aware      Alfredia Client, PA-C 07/02/21 [place], [place], DO 07/02/21 2313

## 2021-07-02 NOTE — H&P (Addendum)
[facility] Admission History and Physical Service Pager: [phone]  Patient name: George Leon Medical record number: [id number] Date of birth: 04-27-1956 Age: 65 y.o. Gender: male  Primary Care Provider: Alen Bleacher, MD Consultants: EP-Cardiology Code Status:Full code Preferred Emergency Contact: Contact Information     Name Relation Home Work Mobile   [place] Spouse [phone]  [phone]   George Leon   [phone]        Chief Complaint: Symptomatic bradycardia   Assessment and Plan: George Leon is a 65 y.o. male presenting with symptomatic bradycardia. PMH is significant for HTN, GERD, chronic bronchitis, peripheral vascular disease, diastolic heart failure, CAD, pulmonary hypertension, HLD, COPD  Symptomatic bradycardia George Leon is a 65 yr old male who presents with symptomatic bradycardia. Patient went to see cardiologist, Dr. 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 He was sent to the ED for further work and likely need for pacemaker placement  He has been feeling short of breath, tired, dizzy for the past couple of months but has been getting worse, especially with activity. Initially 2:1 heart block was seen in the EKG followed by third-degree heart block with ventricular rate of 40 bpm. BNP, TSH, magnesium, troponin, CMP, CBC, I-stat chem 8 ordered. In ED, patient has BNP of 457, WBC elevated at 18.3. Troponin, TSH, and magnesium WNL. CXR shows mild central vascular congestion. Keep NPO after midnight for cath in a.m. Possible causes of his heart block include fibrosis of conduction system, ischemia, drug-induced.  Most likely the cause is ischemic. Pt is not on beta blockers. Patient will have cardiac cath and pacemaker placement in the morning. -EP Cardiology consulted -Vitals per floor routine -Up with assistance  -Hold  antihypertensive meds in setting of symptomatic bradycardia  -Cardiac cath in a.m. -CBC in a.m. -BMP in a.m. -Magnesium in a.m. M -HIV antibody -EKG in a.m. -Continuous cardiac monitoring -SCDs for DVT ppx   Mild CHF exacerbation  HFpEF Patient has been experiencing lower extremity swelling and dyspnea over the last 2 to 3 weeks.  He has been sleeping on his side as laying flat causes increased shortness of breath.  Pt does appear fluid overloaded on exam with bilateral crackles and 2+ peripherally edema bilaterally. Last echocardiogram was in June 2016 which showed mild left ventricular hypertrophy, grade 1 diastolic dysfunction, EF of 60%. S/p Lasix 40 mg IV in the ER.  -Cardiology consulted -Strict I's/O's -Daily weights -Dose lasix tomorrow according to response and output -F/u echo   Hypertension Blood pressure in ED is 134/99.   Home medications include amlodipine 5 mg daily, and losartan 100 mg at bedtime. -Hold home meds in setting for symptomatic bradycardia  HLD Last lipid panel done in April 2022 showed a total cholesterol of 146, LDL of 86, triglycerides of 140.  Patient takes Lipitor 80 mg daily, Zetia 10 mg daily. -Continue Lipitor and Zetia  GERD Patient takes Nexium 40 mg daily. -Protonix 40 mg daily  COPD Patient takes Incruse Ellipta and albuterol at home. -Continue albuterol and Incruse Ellipta  Chronic pain 2/2 OA Pt reports pain is in his hands, ankles, knees, back etc. Endorses this pain for several years. Patient takes Norco 7.5-325 mg every 6 hours as needed. Reports PCP has been filling Norco '5mg'$  dose instead which has not been helping much. Has been taking this for 20 years. Prior to this was taking Oxy IR  $'5mg'L$  but chose to discontinue due to fear of side effects. Does not want to take dilaudid from prior experience.  -Norco 7.5-325 mg every 6 hours as needed  Neuropathy  Home meds: Cymbalta 60 mg daily -Cymbalta 60 mg daily  Tobacco use Patient  has smoked a pack a day for the past 40 years. -Nicotine patch '21mg'$  daily   FEN/GI: NPO from [place] Prophylaxis: SCDs  Disposition: cardiac telemetry   History of Present Illness:  George Leon is a 65 y.o. male presenting with symptomatic bradycardia.  Started feeling unwell 3 weeks ago. He was tired and dyspneic on exertion. Also reports dizziness and felt like he was being smothered with a pillow, orthopnea. Does not need extra pillows to sleep. Also endorses chest pain and palpitations which feels like an electric shock.  Reports peripheral edema which started 2 weeks ago. Does not take lasix at home. He sometimes borrows his wife's lasix. His apple watch showed his HR 40s for the last week. Pt was trying to quit smoking. Smokes a pack a day for 40 years. Smokes marijuana. He makes his own cigarettes at home. Denies drinking ETOH, stopped drinking ETOH a few weeks ago. Has not taken medications today.   Received Lasix in the ED. Denies responding to this dose.   Has had an angiogram of the heart. Follows Dr Terri Skains.  Covid x 2 plus booster. Happy to get next booster in the hospital.   Review Of Systems: Per HPI with the following additions:   Review of Systems  Respiratory:  Positive for shortness of breath. Negative for chest tightness.   Cardiovascular:  Positive for chest pain and palpitations.  Gastrointestinal:  Negative for abdominal pain, constipation, diarrhea, nausea and vomiting.  Genitourinary:  Negative for dysuria and frequency.  Musculoskeletal:  Positive for arthralgias and back pain.  Neurological:  Positive for dizziness, light-headedness and numbness.    Patient Active Problem List   Diagnosis Date Noted   COPD exacerbation ([place]) 04/02/2021   Right knee pain 01/15/2021   Right shoulder pain 01/15/2021   Insomnia 08/07/2020   Chronic narcotic use 06/15/2020   Scalp irritation 08/17/2019   Facet arthritis of lumbar region 02/19/2018   Spinal stenosis of lumbar  region without neurogenic claudication 02/19/2018   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   Anemia 05/12/2017   Other tear of medial meniscus, current injury, left knee, subsequent encounter 04/21/2017   Chronic pain of left knee 04/21/2017   Chronic back pain 02/12/2017   Pulmonary hypertension ([place]) 09/13/2016   Hyperlipidemia 09/13/2016   Cervical neck pain with evidence of disc disease 01/18/2016   Rash and nonspecific skin eruption 12/19/2015   Chronic pain syndrome 12/01/2015   Tobacco abuse    Coronary artery disease involving native coronary artery of native heart without angina pectoris    Chronic venous insufficiency [id number]   Diastolic CHF ([place]) [id number]   Proteinuria 05/22/2015   Macular degeneration, 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   Osteoarthritis of left knee 12/28/2014   Osteoarthritis of right knee 12/28/2014   Peripheral vascular disease ([place]) 08/23/2013   Disorder of SI (sacroiliac) joint 03/30/2013   GANGLION CYST, WRIST, LEFT 11/16/2010   Obstructive chronic bronchitis without exacerbation ([place]) 10/26/2010   Osteoarthritis of multiple joints 08/25/2008   OBESITY, MORBID 05/26/2008   GERD 06/19/2007   HYPERTENSION, BENIGN 02/16/2007    Past Medical History: Past Medical History:  Diagnosis Date   Acute MI ([place])    Acute on chronic diastolic heart  failure ([place]) 12/01/2015   Anxiety    Arthritis    "qwhere" (08/06/2017)   CHF (congestive heart failure) ([place]) dx'd 2016   Chronic back pain    "all my back" (08/06/2017)   Chronic bronchitis ([facility])    Chronic pain    Chronic pain syndrome 12/01/2015   COPD (chronic obstructive pulmonary disease) ([facility])    Dyspnea    w/ exertion    Emphysema of lung ([facility])    High cholesterol    History of hiatal hernia    possible     Hypertension    Liver fatty degeneration    Macular degeneration, right eye    Pneumonia  1990s X 1; 2016    Past Surgical History: Past Surgical History:  Procedure Laterality  Date   ANTERIOR CERVICAL DECOMP/DISCECTOMY FUSION  1988   "plate; cadavar bone; couple bolts"   BACK SURGERY     CARDIAC CATHETERIZATION     JOINT REPLACEMENT     KNEE ARTHROSCOPY Left 05/27/2017   Procedure: LEFT KNEE ARTHROSCOPY WITH PARTIAL MEDIAL MENISCECTOMY;  Surgeon: Mcarthur Rossetti, MD;  Location: [facility];  Service: Orthopedics;  Laterality: Left;   SHOULDER ARTHROSCOPY WITH ROTATOR CUFF REPAIR Right    TOTAL HIP ARTHROPLASTY Left 08/05/2017   Procedure: LEFT TOTAL HIP ARTHROPLASTY ANTERIOR APPROACH;  Surgeon: Mcarthur Rossetti, MD;  Location: [facility];  Service: Orthopedics;  Laterality: Left;    Social History: Social History   Tobacco Use   Smoking status: Every Day    Packs/day: 1.00    Years: 45.00    Pack years: 45.00    Types: Cigarettes   Smokeless tobacco: Former    Types: Nurse, children's Use: Never used  Substance Use Topics   Alcohol use: Yes    Comment: 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   Drug use: Yes    Types: Marijuana    Comment: 08/06/2017 "last time was 07/2017; none in years before that"  Please also refer to relevant sections of EMR.  Family History: Family History  Problem Relation Age of Onset   Hypertension Father    AAA (abdominal aortic aneurysm) Father     Allergies and Medications: Allergies  Allergen Reactions   Sulfamethoxazole Anaphylaxis and Swelling    Tongue Swelling   Adhesive [Tape] Rash    Paper tape is what bothers him Pulled skin off also   Hydrochlorothiazide Other (See Comments)    Back Pain & gives him kidney stones   No current facility-administered medications on file prior to encounter.   Current Outpatient Medications on File Prior to Encounter  Medication Sig Dispense Refill   albuterol (PROAIR HFA) 108 (90 Base) MCG/ACT inhaler Inhale 1-2 puffs into the lungs every 4 (four) hours as needed. As needed for shortness of breath /wheeze/cough 18 g 1   amLODipine (NORVASC) 5 MG tablet TAKE  1 TABLET DAILY 90 tablet 3   atorvastatin (LIPITOR) 80 MG tablet TAKE 1 TABLET DAILY 90 tablet 3   DULoxetine (CYMBALTA) 60 MG capsule TAKE 1 CAPSULE DAILY 90 capsule 3   Elastic Bandages & Supports (MEDICAL COMPRESSION STOCKINGS) MISC 1 each by Does not apply route once. 2 each 0   esomeprazole (NEXIUM) 40 MG capsule TAKE 1 CAPSULE DAILY AT 12 NOON 90 capsule 3   ezetimibe (ZETIA) 10 MG tablet Take 1 tablet (10 mg total) by mouth daily. 90 tablet 3   HYDROcodone-acetaminophen (NORCO) 7.5-325 MG tablet Take 1  tablet by mouth every 6 (six) hours as needed for moderate pain. 120 tablet 0   losartan (COZAAR) 100 MG tablet TAKE 1 TABLET AT BEDTIME 90 tablet 3   Multiple Vitamins-Minerals (OCUVITE ADULT FORMULA) CAPS Take 1 capsule by mouth daily. 90 capsule 3   naloxone (NARCAN) 0.4 MG/ML injection Use as needed for signs of overdose. 1 mL 0   nicotine ([employer] NICOTINE) 21 mg/24hr patch PLACE 1 PATCH ONTO SKIN DAILY 42 patch 2   nystatin cream (MYCOSTATIN) APPLY TOPICALLY TWICE A DAY 30 g 23   selenium sulfide (SELSUN) 2.5 % shampoo Apply 1 application topically daily as needed for irritation. 118 mL 2   sodium chloride (OCEAN) 0.65 % SOLN nasal spray Place 1 spray into both nostrils as needed for congestion.     umeclidinium bromide (INCRUSE ELLIPTA) 62.5 MCG/INH AEPB Inhale 1 puff into the lungs daily. 90 each 3    Objective: BP (!) 134/99   Pulse (!) 50   Resp 18   SpO2 95%   Exam: General: Patient sitting up in bed, no acute distress Eyes: White sclera, normal EOM, no discharge ENTM: No enlarged thyroid, supple, no lymphadenopathy Neck: Trachea midline Cardiovascular: Bradycardic, faint irregular heart sounds Respiratory: Crackles in bilateral bases, no wheezes MSK: Good bulk and tone, no obvious deformities  Extremities: 1+ pitting edema in BLE's Neuro: Alert and oriented Psych: Affect appropriate for situation  Labs and Imaging: CBC BMET  Recent Labs  Lab 07/02/21 2013  07/02/21 2105  WBC 18.3*  --   HGB 13.5 13.3  HCT 43.4 39.0  PLT 226  --    Recent Labs  Lab 07/02/21 2013 07/02/21 2105  NA 136 137  K 3.8 3.8  CL 107 105  CO2 22  --   BUN 14 15  CREATININE 1.06 1.00  GLUCOSE 91 88  CALCIUM 8.6*  --      EKG: Third degree AV block  Precious Gilding, DO 07/02/2021, 9:31 PM PGY-1, [place] Intern pager: [phone], text pages welcome   FPTS Upper-Level Resident Addendum   I have independently interviewed and examined the patient. I have discussed the above with the original author and agree with their documentation. My edits for correction/addition/clarification are in black. Please see also any attending notes.   Lattie Haw MD PGY-3, [place] [facility] 07/03/2021 2:10 AM  FPTS Service pager: [phone] (text pages welcome through [facility])

## 2021-07-03 ENCOUNTER — Encounter (HOSPITAL_COMMUNITY): Payer: Self-pay | Admitting: Family Medicine

## 2021-07-03 ENCOUNTER — Inpatient Hospital Stay (HOSPITAL_COMMUNITY): Payer: BC Managed Care – PPO

## 2021-07-03 ENCOUNTER — Encounter (HOSPITAL_COMMUNITY)
Admission: EM | Disposition: A | Discharge status: Home / Self Care | Payer: Self-pay | Source: Ambulatory Visit | Attending: Family Medicine

## 2021-07-03 DIAGNOSIS — I5031 Acute diastolic (congestive) heart failure: Secondary | ICD-10-CM

## 2021-07-03 DIAGNOSIS — I4439 Other atrioventricular block: Secondary | ICD-10-CM | POA: Insufficient documentation

## 2021-07-03 DIAGNOSIS — R0600 Dyspnea, unspecified: Secondary | ICD-10-CM

## 2021-07-03 DIAGNOSIS — R0609 Other forms of dyspnea: Secondary | ICD-10-CM

## 2021-07-03 DIAGNOSIS — I509 Heart failure, unspecified: Secondary | ICD-10-CM | POA: Insufficient documentation

## 2021-07-03 DIAGNOSIS — I441 Atrioventricular block, second degree: Secondary | ICD-10-CM | POA: Insufficient documentation

## 2021-07-03 DIAGNOSIS — I442 Atrioventricular block, complete: Secondary | ICD-10-CM | POA: Insufficient documentation

## 2021-07-03 HISTORY — PX: LEFT HEART CATH AND CORONARY ANGIOGRAPHY: CATH118249

## 2021-07-03 LAB — MAGNESIUM: Magnesium: 2 mg/dL (ref 1.7–2.4)

## 2021-07-03 LAB — ECHOCARDIOGRAM COMPLETE
Area-P 1/2: 3.19 cm2
Calc EF: 64.4 %
Height: 68 in
Radius: 0.3 cm
S' Lateral: 3.7 cm
Single Plane A2C EF: 64.6 %
Single Plane A4C EF: 61.8 %
Weight: 4352 oz

## 2021-07-03 LAB — CBC
HCT: 39.5 % (ref 39.0–52.0)
Hemoglobin: 12.3 g/dL — ABNORMAL LOW (ref 13.0–17.0)
MCH: 28.5 pg (ref 26.0–34.0)
MCHC: 31.1 g/dL (ref 30.0–36.0)
MCV: 91.4 fL (ref 80.0–100.0)
Platelets: 210 10*3/uL (ref 150–400)
RBC: 4.32 MIL/uL (ref 4.22–5.81)
RDW: 14.2 % (ref 11.5–15.5)
WBC: 15.5 10*3/uL — ABNORMAL HIGH (ref 4.0–10.5)
nRBC: 0 % (ref 0.0–0.2)

## 2021-07-03 LAB — BASIC METABOLIC PANEL
Anion gap: 10 (ref 5–15)
BUN: 15 mg/dL (ref 8–23)
CO2: 20 mmol/L — ABNORMAL LOW (ref 22–32)
Calcium: 8.4 mg/dL — ABNORMAL LOW (ref 8.9–10.3)
Chloride: 106 mmol/L (ref 98–111)
Creatinine, Ser: 0.99 mg/dL (ref 0.61–1.24)
GFR, Estimated: >60 mL/min (ref >60)
Glucose, Bld: 88 mg/dL (ref 70–99)
Potassium: 3.9 mmol/L (ref 3.5–5.1)
Sodium: 136 mmol/L (ref 135–145)

## 2021-07-03 LAB — HIV ANTIBODY (ROUTINE TESTING W REFLEX): HIV Screen 4th Generation wRfx: NONREACTIVE

## 2021-07-03 SURGERY — LEFT HEART CATH AND CORONARY ANGIOGRAPHY
Anesthesia: LOCAL

## 2021-07-03 MED ORDER — SODIUM CHLORIDE 0.9 % IV SOLN: 250.0000 mL | INTRAVENOUS | Status: DC

## 2021-07-03 MED ORDER — LIDOCAINE HCL (PF) 1 % IJ SOLN
INTRAMUSCULAR | Status: AC
  Filled 2021-07-03: qty 30

## 2021-07-03 MED ORDER — SODIUM CHLORIDE 0.9% FLUSH: 3.0000 mL | INTRAVENOUS | Status: DC | PRN

## 2021-07-03 MED ORDER — ASPIRIN 81 MG PO CHEW: 81.0000 mg | CHEWABLE_TABLET | ORAL | Status: DC

## 2021-07-03 MED ORDER — FUROSEMIDE 40 MG PO TABS
40.0000 mg | ORAL_TABLET | Freq: Every day | ORAL | Status: DC
  Administered 2021-07-03 – 2021-07-05 (×3): 40 mg via ORAL
  Filled 2021-07-03: qty 1
  Filled 2021-07-03: qty 2
  Filled 2021-07-03: qty 1

## 2021-07-03 MED ORDER — SODIUM CHLORIDE 0.9 % IV SOLN: INTRAVENOUS | Status: AC

## 2021-07-03 MED ORDER — SODIUM CHLORIDE 0.9 % WEIGHT BASED INFUSION: 1.0000 mL/kg/h | INTRAVENOUS | Status: DC

## 2021-07-03 MED ORDER — SODIUM CHLORIDE 0.9 % WEIGHT BASED INFUSION: 3.0000 mL/kg/h | INTRAVENOUS | Status: DC

## 2021-07-03 MED ORDER — SODIUM CHLORIDE 0.9 % IV SOLN: 250.0000 mL | INTRAVENOUS | Status: DC | PRN

## 2021-07-03 MED ORDER — SODIUM CHLORIDE 0.9 % IV SOLN: 80.0000 mg | INTRAVENOUS | Status: DC

## 2021-07-03 MED ORDER — SODIUM CHLORIDE 0.9 % IV SOLN: INTRAVENOUS | Status: DC

## 2021-07-03 MED ORDER — MIDAZOLAM HCL 2 MG/2ML IJ SOLN
INTRAMUSCULAR | Status: AC
  Filled 2021-07-03: qty 2

## 2021-07-03 MED ORDER — HEPARIN (PORCINE) IN NACL 1000-0.9 UT/500ML-% IV SOLN
INTRAVENOUS | Status: DC | PRN
  Administered 2021-07-03: 500 mL

## 2021-07-03 MED ORDER — HEPARIN (PORCINE) IN NACL 1000-0.9 UT/500ML-% IV SOLN
INTRAVENOUS | Status: DC | PRN
Start: 2021-07-03 — End: 2021-07-03
  Administered 2021-07-03: 500 mL

## 2021-07-03 MED ORDER — SODIUM CHLORIDE 0.9 % IV SOLN
250.0000 mL | INTRAVENOUS | Status: DC | PRN
  Administered 2021-07-04: 250 mL via INTRAVENOUS

## 2021-07-03 MED ORDER — SODIUM CHLORIDE 0.9% FLUSH
3.0000 mL | Freq: Two times a day (BID) | INTRAVENOUS | Status: DC
  Administered 2021-07-03 (×2): 3 mL via INTRAVENOUS

## 2021-07-03 MED ORDER — NITROGLYCERIN 1 MG/10 ML FOR IR/CATH LAB
INTRA_ARTERIAL | Status: AC
  Filled 2021-07-03: qty 10

## 2021-07-03 MED ORDER — LIDOCAINE HCL (PF) 1 % IJ SOLN
INTRAMUSCULAR | Status: DC | PRN
  Administered 2021-07-03: 2 mL

## 2021-07-03 MED ORDER — NITROGLYCERIN 1 MG/10 ML FOR IR/CATH LAB
INTRA_ARTERIAL | Status: DC | PRN
  Administered 2021-07-03: 300 ug via INTRA_ARTERIAL

## 2021-07-03 MED ORDER — HEPARIN (PORCINE) IN NACL 1000-0.9 UT/500ML-% IV SOLN
INTRAVENOUS | Status: AC
  Filled 2021-07-03: qty 1000

## 2021-07-03 MED ORDER — CHLORHEXIDINE GLUCONATE 4 % EX LIQD: 60.0000 mL | Freq: Once | CUTANEOUS | Status: DC

## 2021-07-03 MED ORDER — VERAPAMIL HCL 2.5 MG/ML IV SOLN
INTRAVENOUS | Status: AC
  Filled 2021-07-03: qty 2

## 2021-07-03 MED ORDER — MIDAZOLAM HCL 2 MG/2ML IJ SOLN
INTRAMUSCULAR | Status: DC | PRN
  Administered 2021-07-03: 2 mg via INTRAVENOUS

## 2021-07-03 MED ORDER — CEFAZOLIN IN SODIUM CHLORIDE 3-0.9 GM/100ML-% IV SOLN
3.0000 g | INTRAVENOUS | Status: DC
  Filled 2021-07-03: qty 100

## 2021-07-03 MED ORDER — ONDANSETRON HCL 4 MG/2ML IJ SOLN: 4.0000 mg | Freq: Four times a day (QID) | INTRAMUSCULAR | Status: DC | PRN

## 2021-07-03 MED ORDER — SODIUM CHLORIDE 0.9% FLUSH: 3.0000 mL | Freq: Two times a day (BID) | INTRAVENOUS | Status: DC

## 2021-07-03 MED ORDER — VERAPAMIL HCL 2.5 MG/ML IV SOLN
INTRAVENOUS | Status: DC | PRN
  Administered 2021-07-03: 5 mL via INTRA_ARTERIAL

## 2021-07-03 MED ORDER — CALCIUM CARBONATE ANTACID 500 MG PO CHEW
800.0000 mg | CHEWABLE_TABLET | Freq: Once | ORAL | Status: AC
  Administered 2021-07-03: 800 mg via ORAL
  Filled 2021-07-03: qty 4

## 2021-07-03 MED ORDER — SODIUM CHLORIDE 0.9% FLUSH
3.0000 mL | Freq: Two times a day (BID) | INTRAVENOUS | Status: DC
Start: 2021-07-03 — End: 2021-07-05
  Administered 2021-07-04 – 2021-07-05 (×3): 3 mL via INTRAVENOUS

## 2021-07-03 MED ORDER — SODIUM CHLORIDE 0.9 % IV SOLN
INTRAVENOUS | Status: AC | PRN
  Administered 2021-07-03: 100 mL/h via INTRAVENOUS

## 2021-07-03 MED ORDER — HEPARIN SODIUM (PORCINE) 1000 UNIT/ML IJ SOLN
INTRAMUSCULAR | Status: AC
  Filled 2021-07-03: qty 1

## 2021-07-03 MED ORDER — HEPARIN SODIUM (PORCINE) 1000 UNIT/ML IJ SOLN
INTRAMUSCULAR | Status: DC | PRN
  Administered 2021-07-03: 6500 [IU] via INTRAVENOUS

## 2021-07-03 MED ORDER — ACETAMINOPHEN 325 MG PO TABS
650.0000 mg | ORAL_TABLET | ORAL | Status: DC | PRN
  Administered 2021-07-04: 650 mg via ORAL
  Filled 2021-07-03: qty 2

## 2021-07-03 MED ORDER — HYDRALAZINE HCL 20 MG/ML IJ SOLN: 10.0000 mg | INTRAMUSCULAR | Status: AC | PRN

## 2021-07-03 SURGICAL SUPPLY — 9 items
CATH OPTITORQUE TIG 4.0 5F (CATHETERS) ×1 IMPLANT
DEVICE RAD COMP TR BAND LRG (VASCULAR PRODUCTS) ×1 IMPLANT
GLIDESHEATH SLEND A-KIT 6F 22G (SHEATH) ×1 IMPLANT
GUIDEWIRE INQWIRE 1.5J.035X260 (WIRE) IMPLANT
INQWIRE 1.5J .035X260CM (WIRE) ×2
KIT HEART LEFT (KITS) ×2 IMPLANT
PACK CARDIAC CATHETERIZATION (CUSTOM PROCEDURE TRAY) ×2 IMPLANT
TRANSDUCER W/STOPCOCK (MISCELLANEOUS) ×2 IMPLANT
TUBING CIL FLEX 10 FLL-RA (TUBING) ×2 IMPLANT

## 2021-07-03 NOTE — Consult Note (Addendum)
Cardiology Consultation:   Patient ID: George Leon MRN: [id number]; DOB: 12/07/1956  Admit date: 07/02/2021 Date of Consult: 07/03/2021  PCP:  Alen Bleacher, MD   [facility] Providers Cardiologist:  Dr. Terri Skains   Patient Profile:   George Leon is a 65 y.o. male with a hx of HTN, HLD unclear hx of PVD, obesity, COPD, smoker who is being seen 07/03/2021 for the evaluation of advanced heart block at the request of Dr. Terri Skains.  History of Present Illness:   George Leon was seen yesterday out pt by Dr. Terri Skains for c/o SOB with what seems a known diastolic HF hx and COPD.  He was planned to enrol in an out pt weight loss program and required MD clearance, this prompted the cardiac consult. SOB dated back years though has been progressive to needing to rest often and has been increasingly swollen as well.  He was found in Mobitz II heart block rates 50's and ambulated in the office with stable HR though persistent block Not on any nodal blocking agents referred to the ER, planned for LHC and EP evaluation.  LABS K+ 3.8, 3..9 BUN/Creat 15/0.99 Mag 2.0 WBC 15.5 H/H 12/39 Plts 210  HS Trop 12   Echo completed LVEF 60-65%, no WMA, grade I DD  The patient and his wife reports years of some degree of SOB thugh this progressively worse over the last few months with some short/episodes of CP that he says feels electrical and fleeting, they are also random, though also happening more. In the last 3-4 days his SOB has acutely worsened and noted as well increased swelling legs/feet  He has had some dizzy spells when exerting himself and his wife rep[orts he will look pale, but has not had any near syncope or syncope. He has been sleeping in the recliner of late 2/2 SOB   Past Medical History:  Diagnosis Date   Acute MI ([place])    Acute on chronic diastolic heart failure ([place]) 12/01/2015   Anxiety    Arthritis    "qwhere" (08/06/2017)   CHF (congestive heart failure) ([place]) dx'd  2016   Chronic back pain    "all my back" (08/06/2017)   Chronic bronchitis ([facility])    Chronic pain    Chronic pain syndrome 12/01/2015   COPD (chronic obstructive pulmonary disease) ([facility])    Dyspnea    w/ exertion    Emphysema of lung ([facility])    High cholesterol    History of hiatal hernia    possible     Hypertension    Liver fatty degeneration    Macular degeneration, right eye    Pneumonia  1990s X 1; 2016    Past Surgical History:  Procedure Laterality Date   ANTERIOR CERVICAL DECOMP/DISCECTOMY FUSION  1988   "plate; cadavar bone; couple bolts"   BACK SURGERY     CARDIAC CATHETERIZATION     JOINT REPLACEMENT     KNEE ARTHROSCOPY Left 05/27/2017   Procedure: LEFT KNEE ARTHROSCOPY WITH PARTIAL MEDIAL MENISCECTOMY;  Surgeon: Mcarthur Rossetti, MD;  Location: [facility];  Service: Orthopedics;  Laterality: Left;   SHOULDER ARTHROSCOPY WITH ROTATOR CUFF REPAIR Right    TOTAL HIP ARTHROPLASTY Left 08/05/2017   Procedure: LEFT TOTAL HIP ARTHROPLASTY ANTERIOR APPROACH;  Surgeon: Mcarthur Rossetti, MD;  Location: [facility];  Service: Orthopedics;  Laterality: Left;     Home Medications:  Prior to Admission medications   Medication Sig Start Date End Date Taking? Authorizing Provider  albuterol (PROAIR HFA) 108 (90 Base) MCG/ACT inhaler Inhale 1-2 puffs into the lungs every 4 (four) hours as needed. As needed for shortness of breath /wheeze/cough 03/26/21  Yes Meccariello, Bernita Raisin, DO  amLODipine (NORVASC) 5 MG tablet TAKE 1 TABLET DAILY Patient taking differently: Take 5 mg by mouth daily. 09/19/20  Yes Meccariello, Bernita Raisin, DO  atorvastatin (LIPITOR) 80 MG tablet TAKE 1 TABLET DAILY Patient taking differently: Take 80 mg by mouth every evening. 09/19/20  Yes Meccariello, Bernita Raisin, DO  DULoxetine (CYMBALTA) 60 MG capsule TAKE 1 CAPSULE DAILY Patient taking differently: Take 60 mg by mouth daily. TAKE 1 CAPSULE DAILY 09/19/20  Yes Meccariello, Bernita Raisin, DO  Elastic Bandages &  Supports ([facility]) [place] 1 each by Does not apply route once. 06/19/15  Yes Patrecia Pour, MD  esomeprazole (NEXIUM) 40 MG capsule TAKE 1 CAPSULE DAILY AT 12 NOON Patient taking differently: Take 40 mg by mouth daily. 09/19/20  Yes Meccariello, Bernita Raisin, DO  ezetimibe (ZETIA) 10 MG tablet Take 1 tablet (10 mg total) by mouth daily. 04/04/21  Yes Meccariello, Bernita Raisin, DO  HYDROcodone-acetaminophen (NORCO) 7.5-325 MG tablet Take 1 tablet by mouth every 6 (six) hours as needed for moderate pain. 06/30/21  Yes Alen Bleacher, MD  losartan (COZAAR) 100 MG tablet TAKE 1 TABLET AT BEDTIME Patient taking differently: Take 100 mg by mouth daily. 08/21/20  Yes Meccariello, Bernita Raisin, DO  Multiple Vitamins-Minerals (OCUVITE ADULT FORMULA) CAPS Take 1 capsule by mouth daily. 05/22/15  Yes Patrecia Pour, MD  naloxone [facility]) 0.4 MG/ML injection Use as needed for signs of overdose. 01/16/21  Yes Meccariello, Bernita Raisin, DO  nicotine ([facility] NICOTINE) 21 mg/24hr patch PLACE 1 PATCH ONTO SKIN DAILY 06/30/21  Yes Alen Bleacher, MD  nystatin cream (MYCOSTATIN) APPLY TOPICALLY TWICE A DAY Patient taking differently: Apply 1 application topically 2 (two) times daily. 04/05/21  Yes Meccariello, Bernita Raisin, DO  selenium sulfide (SELSUN) 2.5 % shampoo Apply 1 application topically daily as needed for irritation. 01/15/21  Yes Meccariello, Bernita Raisin, DO  sodium chloride (OCEAN) 0.65 % SOLN nasal spray Place 1 spray into both nostrils as needed for congestion.   Yes [provider]  umeclidinium bromide (INCRUSE ELLIPTA) 62.5 MCG/INH AEPB Inhale 1 puff into the lungs daily. 07/04/20  Yes Meccariello, Bernita Raisin, DO    Inpatient Medications: Scheduled Meds:  aspirin  81 mg Oral Pre-Cath   atorvastatin  80 mg Oral Daily   DULoxetine  60 mg Oral Daily   ezetimibe  10 mg Oral Daily   furosemide  40 mg Oral Daily   nicotine  21 mg Transdermal Daily   pantoprazole  40 mg Oral Daily   sodium chloride flush  3 mL  Intravenous Q12H   umeclidinium bromide  1 puff Inhalation Daily   Continuous Infusions:  sodium chloride     sodium chloride Stopped (07/03/21 1258)   Followed by   sodium chloride Stopped (07/03/21 1259)   PRN Meds: sodium chloride, albuterol, HYDROcodone-acetaminophen, naloxone, sodium chloride, sodium chloride flush  Allergies:    Allergies  Allergen Reactions   Adhesive [Tape] Rash    Paper tape is what bothers him Pulled skin off also   Hydrochlorothiazide Other (See Comments)    Back Pain & gives him kidney stones   Sulfamethoxazole Swelling and Other (See Comments)    Tongue Swelling. Pt states he has had a sulfa drug since and has tolerated them.    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   Drug use: Yes    Types: Marijuana    Comment: 08/06/2017 "last time was 07/2017; none in years before that"   Sexual activity: Never  Other Topics Concern   Not on file  Social History Narrative   Not on file   Social Determinants of Health   Financial Resource Strain: Not on file  Food Insecurity: Not on file  Transportation Needs: Not on file  Physical Activity: Not on file  Stress: Not on file  Social Connections: Not on file  Intimate Partner Violence: Not on file    Family History:   Family History  Problem Relation Age of Onset   Hypertension Father    AAA (abdominal aortic aneurysm) Father      ROS:  Please see the history of present  illness.   All other ROS reviewed and negative.     Physical Exam/Data:   Vitals:   07/03/21 1027 07/03/21 1028 07/03/21 1029 07/03/21 1130  BP:    (!) 114/49  Pulse: (!) 32 (!) 32 (!) 36 91  Resp: 14 17 (!) 23 (!) 22  Temp:      TempSrc:      SpO2: 97% 95% 95% 90%   No intake or output data in the 24 hours ending 07/03/21 1300 Last 3 Weights 07/02/2021 04/02/2021 01/15/2021  Weight (lbs) 272 lb 269 lb 12.8 oz 267 lb 6 oz  Weight (kg) 123.378 kg 122.38 kg 121.281 kg     There is no height or weight on file to calculate BMI.  General:  Well nourished, well developed, in no acute distress HEENT: normal Lymph: no adenopathy Neck: no JVD Endocrine:  No thryomegaly Vascular: No carotid bruits; FA pulses 2+ bilaterally without bruits  Cardiac:  irregular and bradycardic; no murmurs, gallops or rubs Lungs:  diminished at the bases, soft crackles, no wheezing, rhonchi or rales  Abd: soft, nontender Ext: 1-2+ edema b/l LE Musculoskeletal:  No deformities Skin: warm and dry  Neuro:  no  gross focal abnormalities noted Psych:  Normal affect   EKG:  The EKG was personally reviewed and demonstrates:    07/02/21 SR , Mobitz II, rate 62bpm, RBBB, LAD 725/22 CHB, V rate 45 RBBB, LAD  OLD 05/19/2017 SR 88bpm, RBBB, LAD  Telemetry:  Telemetry was personally reviewed and demonstrates:   CHB 30's-40's  Relevant CV Studies:   07/03/21: TTE IMPRESSIONS   1. Left ventricular ejection fraction, by estimation, is 60 to 65%. The  left ventricle has normal function. The left ventricle has no regional  wall motion abnormalities. There is mild concentric left ventricular  hypertrophy. Left ventricular diastolic  parameters are consistent with Grade I diastolic dysfunction (impaired  relaxation).   2. Right ventricular systolic function is normal. The right ventricular  size is normal. There is mildly elevated pulmonary artery systolic  pressure.   3. The mitral valve is normal in structure.  Mild mitral valve  regurgitation. No evidence of mitral stenosis.   4. The aortic valve is tricuspid. Aortic valve regurgitation is not  visualized. No aortic stenosis is present.   5. The inferior vena cava is dilated in size with <50% respiratory  variability, suggesting right atrial pressure of 15 mmHg.    Echocardiogram: 11/26/2015: LVEF 55 to 60%, no regional wall motion abnormalities, grade 1 diastolic impairment, elevated LVEDP, mildly dilated right atrium, moderate TR, PASP 48 mmHg.    Laboratory Data:  High Sensitivity Troponin:   Recent Labs  Lab 07/02/21 2013 07/02/21 2104  TROPONINIHS 14 12     Chemistry Recent Labs  Lab 07/02/21 2013 07/02/21 2105 07/03/21 0438  NA 136 137 136  K 3.8 3.8 3.9  CL 107 105 106  CO2 22  --  20*  GLUCOSE 91 88 88  BUN '14 15 15  '$ CREATININE 1.06 1.00 0.99  CALCIUM 8.6*  --  8.4*  GFRNONAA >60  --  >60  ANIONGAP 7  --  10    Recent Labs  Lab 07/02/21 2013  PROT 6.2*  ALBUMIN 3.4*  AST 24  ALT 39  ALKPHOS 88  BILITOT 0.6   Hematology Recent Labs  Lab 07/02/21 2013 07/02/21 2105 07/03/21 0438  WBC 18.3*  --  15.5*  RBC 4.73  --  4.32  HGB 13.5 13.3 12.3*  HCT 43.4 39.0 39.5  MCV 91.8  --  91.4  MCH 28.5  --  28.5  MCHC 31.1  --  31.1  RDW 14.1  --  14.2  PLT 226  --  210   BNP Recent Labs  Lab 07/02/21 2013  BNP 457.0*    DDimer No results for input(s): DDIMER in the last 168 hours.   Radiology/Studies:  DG Chest Portable 1 View  Result Date: 07/02/2021 CLINICAL DATA:  65 year old male with chest pain. EXAM: PORTABLE CHEST 1 VIEW COMPARISON:  Chest radiograph dated 12/06/2015 and CT dated 11/28/2016. FINDINGS: No focal consolidation, pleural effusion, pneumothorax. There is mild cardiomegaly with mild central vascular congestion. No acute osseous pathology. Lower cervical ACDF. IMPRESSION: Mild cardiomegaly with mild central vascular congestion. No focal consolidation. Electronically Signed   By: Anner Crete M.D.   On: 07/02/2021 19:53    Assessment and Plan:   Advanced heart block including CHB Baseline RBBB, LAD known for him On amlodipine not felt to contribute, no other potential nodal blocking agents  Will await his cath findings BP stable No rest symptoms of bradycardia Keep him NPO for possible (likely pacer) tomorrow  Discussed CHB diagnosis and rational for pacer if cath is clean, discussed the procedure as well. Will follow up post cath Dr. Rayann Heman will see later today  Continue management with Dr. Terri Skains and attending as well.   Risk Assessment/Risk Scores:    For questions or updates, please contact [place] Please consult [email or web address] for contact info under    Signed, Baldwin Jamaica, PA-C  07/03/2021 1:00 PM   I have seen, examined the patient, and reviewed the above assessment and plan.  Changes to above are made where necessary.  On exam, RRR.  The patient presents with complete heart block.  He has chronic conduction system disease.  He is planned for cath later today to evaluate for ischemia as the cause. If cath is unrevealing for reversible/ ischemic  cause, we will plan PPM tomorrow.   Risks, benefits, alternatives to pacemaker implantation were discussed in detail with the patient and his wife today. The patient understands that the risks include but are not limited to bleeding, infection, pneumothorax, perforation, tamponade, vascular damage, renal failure, MI, stroke, death,  and lead dislodgement and wishes to proceed. We will therefore schedule the procedure tomorrow morning with Dr Lovena Le if cath is unrevealing later today.   Co Sign: Thompson Grayer, MD 07/03/2021 2:29 PM

## 2021-07-03 NOTE — Progress Notes (Addendum)
FPTS Brief Progress Note  S: Pt was awake watching television.  Only complaint is that he was very warm in his room, I showed him where the thermostat was.   O: BP (!) 126/58   Pulse 81   Temp 97.6 F (36.4 C) (Oral)   Resp 18   Ht '5\' 8"'$  (1.727 m)   Wt 119.7 kg   SpO2 95%   BMI 40.12 kg/m   General: Sitting in bed, NAD Cardio: Bradycardic at 36 bpm Respiratory: breathing comfortably on RA  A/P: Continue to follow plan outlined in day teams progress note -NPO at midnight -Plan for pacemaker implant tomorrow - Orders reviewed. Labs for AM ordered, which was adjusted as needed.    Precious Gilding, DO 07/03/2021, 8:10 PM PGY-1, [facility] Night Resident  Please page [phone] with questions.

## 2021-07-03 NOTE — Progress Notes (Signed)
PT Cancellation Note  Patient Details Name: EINER FERKO MRN: [id number] DOB: [birth year]   Cancelled Treatment:    Reason Eval/Treat Not Completed: Patient not medically ready Pt awaiting cardiac cath today. Will follow up as pt medically appropriate and as schedule allows.   Reuel Derby, PT, DPT  Acute Rehabilitation Services  Pager: [phone] Office: [phone]    Rudean Hitt 07/03/2021, 2:11 PM

## 2021-07-03 NOTE — Progress Notes (Signed)
  Echocardiogram 2D Echocardiogram has been performed.  George Leon 07/03/2021, 12:22 PM

## 2021-07-03 NOTE — Consult Note (Signed)
CARDIOLOGY CONSULT NOTE  Patient ID: George Leon MRN: [id number] DOB/AGE: 01-14-1956 65 y.o.  Admit date: 07/02/2021 Attending physician: Leeanne Rio, MD Primary Physician:  Alen Bleacher, MD Outpatient Cardiologist: Rex Kras, DO, [facility] Inpatient Cardiologist: Rex Kras, DO, [facility]  Reason of consultation: Shortness of breath Referring physician: Dr.Yao  Chief complaint: Shortness of breath  HPI:  George Leon is a 65 y.o. Caucasian male who presents with a chief complaint of " shortness of breath." His past medical history and cardiovascular risk factors include: Hypertension, hyperlipidemia, tobacco abuse, COPD, obesity due to excess calories, questionable history of PVD.  Patient presented to the office for consultation with regards to shortness of breath which has been getting progressively worse over the last several months.  He has been experiencing progressive shortness of breath with effort related activities, feeling tired and extremely exhausted, taking frequent breaks with ambulation.   He was referred to cardiology for evaluation of congestive heart failure; however, in the office was noted to have underlying sinus rhythm with second-degree type II and evidence of high degree AV block on ECG.  When he ambulated in the office no significant increase in his heart rate and he required multiple breaks during last 3 and 4 suggestive of overall decreased exercise intolerance.  He was recommended to go to the ER for further evaluation management so that he can be evaluated by EP on a more sooner basis than outpatient follow-up given his symptomatic bradycardia.  Patient denies any chest pain at rest or with effort related activities.  Shortness of breath usually more profound with effort related activities.  Denies near-syncope or syncopal events.  ALLERGIES: Allergies  Allergen Reactions   Adhesive [Tape] Rash    Paper tape is what bothers him Pulled skin off  also   Hydrochlorothiazide Other (See Comments)    Back Pain & gives him kidney stones   Sulfamethoxazole Swelling and Other (See Comments)    Tongue Swelling. Pt states he has had a sulfa drug since and has tolerated them.    PAST MEDICAL HISTORY: Past Medical History:  Diagnosis Date   Acute MI ([place])    Acute on chronic diastolic heart failure ([place]) 12/01/2015   Anxiety    Arthritis    "qwhere" (08/06/2017)   CHF (congestive heart failure) ([place]) dx'd 2016   Chronic back pain    "all my back" (08/06/2017)   Chronic bronchitis ([facility])    Chronic pain    Chronic pain syndrome 12/01/2015   COPD (chronic obstructive pulmonary disease) ([facility])    Dyspnea    w/ exertion    Emphysema of lung ([facility])    High cholesterol    History of hiatal hernia    possible     Hypertension    Liver fatty degeneration    Macular degeneration, right eye    Pneumonia  1990s X 1; 2016    PAST SURGICAL HISTORY: Past Surgical History:  Procedure Laterality Date   ANTERIOR CERVICAL DECOMP/DISCECTOMY FUSION  1988   "plate; cadavar bone; couple bolts"   BACK SURGERY     CARDIAC CATHETERIZATION     JOINT REPLACEMENT     KNEE ARTHROSCOPY Left 05/27/2017   Procedure: LEFT KNEE ARTHROSCOPY WITH PARTIAL MEDIAL MENISCECTOMY;  Surgeon: Mcarthur Rossetti, MD;  Location: [place];  Service: Orthopedics;  Laterality: Left;   SHOULDER ARTHROSCOPY WITH ROTATOR CUFF REPAIR Right    TOTAL HIP ARTHROPLASTY Left 08/05/2017   Procedure: LEFT TOTAL HIP ARTHROPLASTY ANTERIOR APPROACH;  Surgeon: Mcarthur Rossetti, MD;  Location: [facility];  Service: Orthopedics;  Laterality: Left;    FAMILY HISTORY: The patient family history includes AAA (abdominal aortic aneurysm) in his father; Hypertension in his father.   SOCIAL HISTORY:  The patient  reports that he has been smoking cigarettes. He has a 45.00 pack-year smoking history. He has quit using smokeless tobacco.  His smokeless tobacco use included chew. He reports  current alcohol use. He reports current drug use. Drug: Marijuana.  MEDICATIONS: Current Outpatient Medications  Medication Instructions   albuterol (PROAIR HFA) 108 (90 Base) MCG/ACT inhaler 1-2 puffs, Inhalation, Every 4 hours PRN, As needed for shortness of breath /wheeze/cough   amLODipine (NORVASC) 5 MG tablet TAKE 1 TABLET DAILY   atorvastatin (LIPITOR) 80 MG tablet TAKE 1 TABLET DAILY   DULoxetine (CYMBALTA) 60 MG capsule TAKE 1 CAPSULE DAILY   Elastic Bandages & Supports (MEDICAL COMPRESSION STOCKINGS) MISC 1 each, Does not apply,  Once   esomeprazole (NEXIUM) 40 MG capsule TAKE 1 CAPSULE DAILY AT 12 NOON   ezetimibe (ZETIA) 10 mg, Oral, Daily   HYDROcodone-acetaminophen (NORCO) 7.5-325 MG tablet 1 tablet, Oral, Every 6 hours PRN   losartan (COZAAR) 100 MG tablet TAKE 1 TABLET AT BEDTIME   Multiple Vitamins-Minerals (OCUVITE ADULT FORMULA) CAPS 1 capsule, Oral, Daily   naloxone (NARCAN) 0.4 MG/ML injection Use as needed for signs of overdose.   nicotine ([employer] NICOTINE) 21 mg/24hr patch PLACE 1 PATCH ONTO SKIN DAILY   nystatin cream (MYCOSTATIN) APPLY TOPICALLY TWICE A DAY   selenium sulfide (SELSUN) 2.5 % shampoo 1 application, Topical, Daily PRN   sodium chloride (OCEAN) 0.65 % SOLN nasal spray 1 spray, Each Nare, As needed   umeclidinium bromide (INCRUSE ELLIPTA) 62.5 MCG/INH AEPB 1 puff, Inhalation, Daily    REVIEW OF SYSTEMS: Review of Systems  Constitutional: Positive for malaise/fatigue. Negative for chills and fever.  HENT:  Negative for hoarse voice and nosebleeds.   Eyes:  Negative for discharge, double vision and pain.  Cardiovascular:  Positive for dyspnea on exertion and leg swelling. Negative for chest pain, claudication, near-syncope, orthopnea, palpitations, paroxysmal nocturnal dyspnea and syncope.  Respiratory:  Positive for shortness of breath. Negative for hemoptysis.   Musculoskeletal:  Negative for muscle cramps and myalgias.  Gastrointestinal:  Negative for  abdominal pain, constipation, diarrhea, hematemesis, hematochezia, melena, nausea and vomiting.  Neurological:  Negative for dizziness and light-headedness.  All other systems reviewed and are negative.  PHYSICAL EXAM: Vitals with BMI 07/03/2021 07/03/2021 07/03/2021  Height - - -  Weight - - -  BMI - - -  Systolic 123XX123 [id number]  Diastolic [id number] 84 46  Pulse 30 30 50    No intake or output data in the 24 hours ending 07/03/21 0800  Net IO Since Admission: No IO data has been entered for this period [07/03/21 0800]  CONSTITUTIONAL: Appears older than stated age, hemodynamically stable, no acute distress. SKIN: Skin is warm and dry. No rash noted. No cyanosis. No pallor. No jaundice HEAD: Normocephalic and atraumatic. EYES: No scleral icterus MOUTH/THROAT: Moist oral membranes. NECK: No JVD present. No thyromegaly noted. No carotid bruits LYMPHATIC: No visible cervical adenopathy. CHEST Normal respiratory effort. No intercostal retractions LUNGS: Decreased breath sounds bilaterally with expiratory wheezes present.  No stridor. No rales. CARDIOVASCULAR: Regular, positive S1-S2, no murmurs rubs or gallops appreciated. ABDOMINAL: Obese, soft, nontender, nondistended, positive bowel sounds in all 4 quadrants, no apparent ascites. EXTREMITIES: +1 bilateral peripheral edema,  very faint  and DP PT pulses bilaterally. HEMATOLOGIC: No significant bruising NEUROLOGIC: Oriented to person, place, and time. Nonfocal. Normal muscle tone. PSYCHIATRIC: Normal mood and affect. Normal behavior. Cooperative  RADIOLOGY: DG Chest Portable 1 View  Result Date: 07/02/2021 CLINICAL DATA:  65 year old male with chest pain. EXAM: PORTABLE CHEST 1 VIEW COMPARISON:  Chest radiograph dated 12/06/2015 and CT dated 11/28/2016. FINDINGS: No focal consolidation, pleural effusion, pneumothorax. There is mild cardiomegaly with mild central vascular congestion. No acute osseous pathology. Lower cervical ACDF. IMPRESSION:  Mild cardiomegaly with mild central vascular congestion. No focal consolidation. Electronically Signed   By: Anner Crete M.D.   On: 07/02/2021 19:53    LABORATORY DATA: Lab Results  Component Value Date   WBC 15.5 (H) 07/03/2021   HGB 12.3 (L) 07/03/2021   HCT 39.5 07/03/2021   MCV 91.4 07/03/2021   PLT 210 07/03/2021    Recent Labs  Lab 07/02/21 2013 07/02/21 2105 07/03/21 0438  NA 136   < > 136  K 3.8   < > 3.9  CL 107   < > 106  CO2 22  --  20*  BUN 14   < > 15  CREATININE 1.06   < > 0.99  CALCIUM 8.6*  --  8.4*  PROT 6.2*  --   --   BILITOT 0.6  --   --   ALKPHOS 88  --   --   ALT 39  --   --   AST 24  --   --   GLUCOSE 91   < > 88   < > = values in this interval not displayed.    Lipid Panel  Lab Results  Component Value Date   CHOL 146 04/02/2021   HDL 35 (L) 04/02/2021   LDLCALC 86 04/02/2021   LDLDIRECT 85 07/22/2012   TRIG 140 04/02/2021   CHOLHDL 4.2 04/02/2021     BNP (last 3 results) Recent Labs    07/02/21 2013  BNP 457.0*    HEMOGLOBIN A1C Lab Results  Component Value Date   HGBA1C 6.1 08/04/2017    Cardiac Panel (last 3 results) Recent Labs    07/02/21 2013 07/02/21 2104  TROPONINIHS 14 12     TSH Recent Labs    07/02/21 2013  TSH 1.306     CARDIAC DATABASE: EKG: 07/02/2021: Normal sinus rhythm, 62 bpm, left axis, left anterior fascicular block, right bundle branch block, second-degree type II AV block, without underlying injury pattern.   07/02/2021: Sinus bradycardia, 54 bpm, right bundle branch block, left axis, left anterior fascicular block, second-degree type II AV block with ventricular escape beats.  Echocardiogram: None  Stress Testing:   None  Heart Catheterization:  None  IMPRESSION & RECOMMENDATIONS: ILLIAS ARGETSINGER is a 65 y.o. Caucasian male whose past medical history and cardiovascular risk factors include: Hypertension, hyperlipidemia, tobacco abuse, COPD, obesity due to excess calories,  questionable history of PVD.  Symptomatic bradycardia: No identifiable reversible cause. Electrolytes and TSH within normal limits EKG shows underlying sinus rhythm with second-degree type II and evidence of high degree AV block in the setting of underlying right bundle branch and left anterior fascicular block.  I suspect that he is having underlying progressive conduction disease that is significantly impacting his dyspnea on exertion.  Will consult cardiac electrophysiologist Dr. Thompson Grayer for evaluation of symptomatic bradycardia and possible pacemaker.  Spoke to him earlier this morning and after reviewing Mr. Mertie Clause case the recommendations was to schedule him for a  left heart catheterization to rule out an ischemic substrate. Patient is scheduled for left heart catheterization, discussed risks, benefits, and alternatives. The procedure of left heart catheterization with possible intervention was explained to the patient in detail.  The indication, alternatives, risks and benefits were reviewed.  Complications include but not limited to bleeding, infection, vascular injury, stroke, myocardial infection, arrhythmia, kidney injury, radiation-related injury in the case of prolonged fluoroscopy use, emergency cardiac surgery, and death. The patient understands the risks of serious complication is 1-2 in 123XX123 with diagnostic cardiac cath and 1-2% or less with angioplasty/stenting. The patient voices understanding and provides verbal feedback and wishes to proceed with coronary angiography with possible PCI. Left heart catheterization power plan initiated. Plan echocardiogram Avoid AV nodal blocking agents. Patient is asymptomatic at rest, hemodynamics are stable, no current indications for transvenous pacing at this time. Would recommend keeping transcutaneous pads at bedside.   Acute heart failure exacerbation: Newly diagnosed, echo pending BNP elevated, chest x-ray notes vascular  congestion, and patient does have effort related dyspnea and lower extremity swelling. Continue Lasix 40 mg p.o. daily. Recommend checking fasting lipid profile, hemoglobin A1c.  Strict I's and O's and daily weights. Will uptitrate GDMT once the echo results are available and coronary anatomy is known.  Hyperlipidemia: Continue statin therapy and Zetia.  Continue your care given his other chronic comorbid conditions.  CRITICAL CARE Performed by: Rex Kras   Total critical care time: 55 minutes   Critical care time was exclusive of separately billable procedures and treating other patients.   Critical care was necessary to treat or prevent imminent or life-threatening deterioration.   Critical care was time spent personally by me on the following activities: development of treatment plan with patient and/or surrogate as well as nursing, discussions with consultants, evaluation of patient's response to treatment, examination of patient, obtaining history from patient or surrogate, ordering and performing treatments and interventions, ordering and review of laboratory studies, ordering and review of radiographic studies, pulse oximetry and re-evaluation of patient's condition.  Patient's questions and concerns were addressed to his satisfaction. He voices understanding of the instructions provided during this encounter.   This note was created using a voice recognition software as a result there may be grammatical errors inadvertently enclosed that do not reflect the nature of this encounter. Every attempt is made to correct such errors.  Rex Kras, [place], [facility]  Pager: [phone] Office: [phone] 07/03/2021, 8:00 AM

## 2021-07-03 NOTE — ED Notes (Signed)
Pt transported to cath lab.  

## 2021-07-03 NOTE — Interval H&P Note (Signed)
History and Physical Interval Note:  07/03/2021 3:09 PM  George Leon  has presented today for surgery, with the diagnosis of bradicardia - heart failure.  The various methods of treatment have been discussed with the patient and family. After consideration of risks, benefits and other options for treatment, the patient has consented to  Procedure(s): LEFT HEART CATH AND CORONARY ANGIOGRAPHY (N/A) and possible angioplasty as a surgical intervention.  The patient's history has been reviewed, patient examined, no change in status, stable for surgery.  I have reviewed the patient's chart and labs.  Questions were answered to the patient's satisfaction.   Cath Lab Visit (complete for each Cath Lab visit)  Clinical Evaluation Leading to the Procedure:   ACS: Yes.    Non-ACS:    Anginal Classification: CCS IV  Anti-ischemic medical therapy: Minimal Therapy (1 class of medications)  Non-Invasive Test Results: No non-invasive testing performed  Prior CABG: No previous CABG   George Leon

## 2021-07-03 NOTE — Progress Notes (Addendum)
Family Medicine Teaching Service Daily Progress Note Intern Pager: [phone]  Patient name: George Leon Medical record number: [id number] Date of birth: February 01, 1956 Age: 65 y.o. Gender: male  Primary Care Provider: Alen Bleacher, MD Consultants: Cardiology Code Status: Full  Pt Overview and Major Events to Date:  7/25 Admitted   Assessment and Plan: Mr. Randyn is a 65 year old male who presents with symptomatic bradycardia.  PMH is significant for diastolic dysfunction, CAD, PAD, COPD, HTN, pulmonary hypertension, and HLD  Symptomatic bradycardia Patient presented in the ED yesterday with complaints of worsening dyspnea on exertion and intermittent dizziness.  He reported heart rates in the 30s/40s and with bilateral lower extremity edema. On on physical exam there was +1 edema of the RLE and +2 edema on LLE. His EKG was positive for third-degree heart block and his echo this morning was unimpressive with EF of 60 to 65% which is same as in 2016. -Cardiology following, Appreciate recs -Daily BMP -Continue monitoring daily vitals -Continuous cardiac monitoring -Hold amlodipine and Losartan -Restart Lasix 40 mg daily -Scheduled Procedure cath today -Pacemaker placement tomorrow per cardiology  HFpEF His echo this morning showed EF of 60-64% which is unchanged from the previous echo in 2016. BNP-457 -Cardiology following, appreciate recs -Strict Is/Os -Measure daily weight -F/u echo  Hypertension Takes home medication of amlodipine 5 mg and Losartan 100 mg -Continue holding amlodipine and losartan for bradycardia  Hypocalcemia Patient's calcium was 8.4 this morning. -One-time dose of calcium carbonate 800 mg    FEN/GI: N.p.o. PPx: SCD is Dispo:Home pending clinical improvement . Barriers include clinical status.   Subjective:  Patient say he is doing a lot better and his heart rate has improved.  He denies any chest pain or shortness of breath.  Objective: Temp:   [97.6 F (36.4 C)-97.8 F (36.6 C)] 97.8 F (36.6 C) (07/25 2229) Pulse Rate:  [30-103] 50 (07/26 0830) Resp:  [14-22] 14 (07/26 0830) BP: (117-140)/(46-103) 117/51 (07/26 0830) SpO2:  [89 %-97 %] 97 % (07/26 0830) Weight:  [123.4 kg] 123.4 kg (07/25 1310) Physical Exam: General: Awake, alert, well developed, NAD Cardiovascular: RRR, no murmurs Respiratory: CTA B, no wheezing, no rales Abdomen: No abdominal distention or tenderness Extremities: +1 RLE edema, +2 LLE Edema  Laboratory: Recent Labs  Lab 07/02/21 2013 07/02/21 2105 07/03/21 0438  WBC 18.3*  --  15.5*  HGB 13.5 13.3 12.3*  HCT 43.4 39.0 39.5  PLT 226  --  210   Recent Labs  Lab 07/02/21 2013 07/02/21 2105 07/03/21 0438  NA 136 137 136  K 3.8 3.8 3.9  CL 107 105 106  CO2 22  --  20*  BUN '14 15 15  '$ CREATININE 1.06 1.00 0.99  CALCIUM 8.6*  --  8.4*  PROT 6.2*  --   --   BILITOT 0.6  --   --   ALKPHOS 88  --   --   ALT 39  --   --   AST 24  --   --   GLUCOSE 91 88 88    Imaging/Diagnostic Tests: No image studies  Alen Bleacher, MD 07/03/2021, 8:41 AM PGY-1, [place] Intern pager: [phone], text pages welcome

## 2021-07-03 NOTE — ED Notes (Signed)
Echo at bedside

## 2021-07-03 NOTE — Progress Notes (Addendum)
Family Medicine Teaching Service Daily Progress Note Intern Pager: [phone]  Patient name: George Leon Medical record number: [id number] Date of birth: [birth year] Age: 65 y.o. Gender: male  Primary Care Provider: Alen Bleacher, MD Consultants: Cardiology Code Status: Full  Pt Overview and Major Events to Date:  7/25 Admitted 7/26 Left heart Cath and Coronary angiography 7/27 Pacemaker Placement   Assessment and Plan: George Leon is a 65 year old presenting with sympthomatic bradycardia. His PMH is significant for CAD, PAD, diastolic dysfunction, HTN, pulmonary HTN, HTN and HLD  Symptomatic bradycardia Patient was admitted due to persistent bradycardia with intermittent episodes of dizziness. Catheterization study was unremarkable and found no blockage. Met patient after his pacemaker placement procedure. He denies having any dizziness or palpitations. His HR has been stable ranging from 60-90 since his procedure.  -Cardiology following, appreciate recs -Daily BMP -monitor daily vitals -Continue Lasix 40 mg daily -pacemaker placement performed today per Cardiology  HFpEF -Cardiac following, appreciate recs -strict I/Os -daily weight  Hypertension Takes amlodipine 35m and Losartan 100 mg at home. -Hold home medications due to patient's bradycardia  Hypocalcemia Calcium level this morning was 8.7 from 8.2 yesterday after receiving Ca  -continue to monitor calcium level  FEN/GI: NPO PPx: SCDs Dispo:Home pending clinical improvement . Barriers include cardiac procedures.   Subjective:  George Leon he is feeling good after his pacemaker placement procedure.  He denies having any shortness of breath or dizziness at this time.  Objective: Temp:  [97.6 F (36.4 C)-97.8 F (36.6 C)] 97.6 F (36.4 C) (07/26 1705) Pulse Rate:  [30-103] 81 (07/26 1844) Resp:  [10-23] 18 (07/26 1800) BP: (111-156)/(36-103) 126/58 (07/26 1800) SpO2:  [88 %-98 %] 95 % (07/26 1800) Weight:   [119.7 kg] 119.7 kg (07/26 1703) Physical Exam: General: Asleep on the chair bedside, Pleasant  Cardiovascular: RRR, no murmurs Respiratory: Non labored breathing on room air Abdomen: No abdominal distention or tenderness Extremities: +1 LE edema bilaterally   Laboratory: Recent Labs  Lab 07/02/21 2013 07/02/21 2105 07/03/21 0438  WBC 18.3*  --  15.5*  HGB 13.5 13.3 12.3*  HCT 43.4 39.0 39.5  PLT 226  --  210   Recent Labs  Lab 07/02/21 2013 07/02/21 2105 07/03/21 0438  NA 136 137 136  K 3.8 3.8 3.9  CL 107 105 106  CO2 22  --  20*  BUN 14 15 15   CREATININE 1.06 1.00 0.99  CALCIUM 8.6*  --  8.4*  PROT 6.2*  --   --   BILITOT 0.6  --   --   ALKPHOS 88  --   --   ALT 39  --   --   AST 24  --   --   GLUCOSE 91 88 88      Imaging/Diagnostic Tests: No image studies   NAlen Bleacher MD 07/03/2021, 8:34 PM PGY-1, [facility] pager: [phone] text pages welcome

## 2021-07-03 NOTE — H&P (View-Only) (Signed)
CARDIOLOGY CONSULT NOTE  Patient ID: George Leon MRN: [id number] DOB/AGE: May 05, 1956 65 y.o.  Admit date: 07/02/2021 Attending physician: Leeanne Rio, MD Primary Physician:  Alen Bleacher, MD Outpatient Cardiologist: Rex Kras, DO, [facility] Inpatient Cardiologist: Rex Kras, DO, [facility]  Reason of consultation: Shortness of breath Referring physician: Dr.Yao  Chief complaint: Shortness of breath  HPI:  George Leon is a 65 y.o. Caucasian male who presents with a chief complaint of " shortness of breath." His past medical history and cardiovascular risk factors include: Hypertension, hyperlipidemia, tobacco abuse, COPD, obesity due to excess calories, questionable history of PVD.  Patient presented to the office for consultation with regards to shortness of breath which has been getting progressively worse over the last several months.  He has been experiencing progressive shortness of breath with effort related activities, feeling tired and extremely exhausted, taking frequent breaks with ambulation.   He was referred to cardiology for evaluation of congestive heart failure; however, in the office was noted to have underlying sinus rhythm with second-degree type II and evidence of high degree AV block on ECG.  When he ambulated in the office no significant increase in his heart rate and he required multiple breaks during last 3 and 4 suggestive of overall decreased exercise intolerance.  He was recommended to go to the ER for further evaluation management so that he can be evaluated by EP on a more sooner basis than outpatient follow-up given his symptomatic bradycardia.  Patient denies any chest pain at rest or with effort related activities.  Shortness of breath usually more profound with effort related activities.  Denies near-syncope or syncopal events.  ALLERGIES: Allergies  Allergen Reactions   Adhesive [Tape] Rash    Paper tape is what bothers him Pulled skin off  also   Hydrochlorothiazide Other (See Comments)    Back Pain & gives him kidney stones   Sulfamethoxazole Swelling and Other (See Comments)    Tongue Swelling. Pt states he has had a sulfa drug since and has tolerated them.    PAST MEDICAL HISTORY: Past Medical History:  Diagnosis Date   Acute MI ([place])    Acute on chronic diastolic heart failure ([place]) 12/01/2015   Anxiety    Arthritis    "qwhere" (08/06/2017)   CHF (congestive heart failure) ([place]) dx'd 2016   Chronic back pain    "all my back" (08/06/2017)   Chronic bronchitis (H[facility])    Chronic pain    Chronic pain syndrome 12/01/2015   COPD (chronic obstructive pulmonary disease) (H[facility])    Dyspnea    w/ exertion    Emphysema of lung (H[facility])    High cholesterol    History of hiatal hernia    possible     Hypertension    Liver fatty degeneration    Macular degeneration, right eye    Pneumonia  1990s X 1; 2016    PAST SURGICAL HISTORY: Past Surgical History:  Procedure Laterality Date   ANTERIOR CERVICAL DECOMP/DISCECTOMY FUSION  1988   "plate; cadavar bone; couple bolts"   BACK SURGERY     CARDIAC CATHETERIZATION     JOINT REPLACEMENT     KNEE ARTHROSCOPY Left 05/27/2017   Procedure: LEFT KNEE ARTHROSCOPY WITH PARTIAL MEDIAL MENISCECTOMY;  Surgeon: Mcarthur Rossetti, MD;  Location: [place];  Service: Orthopedics;  Laterality: Left;   SHOULDER ARTHROSCOPY WITH ROTATOR CUFF REPAIR Right    TOTAL HIP ARTHROPLASTY Left 08/05/2017   Procedure: LEFT TOTAL HIP ARTHROPLASTY ANTERIOR APPROACH;  Surgeon: Mcarthur Rossetti, MD;  Location: [place];  Service: Orthopedics;  Laterality: Left;    FAMILY HISTORY: The patient family history includes AAA (abdominal aortic aneurysm) in his father; Hypertension in his father.   SOCIAL HISTORY:  The patient  reports that he has been smoking cigarettes. He has a 45.00 pack-year smoking history. He has quit using smokeless tobacco.  His smokeless tobacco use included chew. He reports  current alcohol use. He reports current drug use. Drug: Marijuana.  MEDICATIONS: Current Outpatient Medications  Medication Instructions   albuterol (PROAIR HFA) 108 (90 Base) MCG/ACT inhaler 1-2 puffs, Inhalation, Every 4 hours PRN, As needed for shortness of breath /wheeze/cough   amLODipine (NORVASC) 5 MG tablet TAKE 1 TABLET DAILY   atorvastatin (LIPITOR) 80 MG tablet TAKE 1 TABLET DAILY   DULoxetine (CYMBALTA) 60 MG capsule TAKE 1 CAPSULE DAILY   Elastic Bandages & Supports (MEDICAL COMPRESSION STOCKINGS) MISC 1 each, Does not apply,  Once   esomeprazole (NEXIUM) 40 MG capsule TAKE 1 CAPSULE DAILY AT 12 NOON   ezetimibe (ZETIA) 10 mg, Oral, Daily   HYDROcodone-acetaminophen (NORCO) 7.5-325 MG tablet 1 tablet, Oral, Every 6 hours PRN   losartan (COZAAR) 100 MG tablet TAKE 1 TABLET AT BEDTIME   Multiple Vitamins-Minerals (OCUVITE ADULT FORMULA) CAPS 1 capsule, Oral, Daily   naloxone (NARCAN) 0.4 MG/ML injection Use as needed for signs of overdose.   nicotine ([employer] NICOTINE) 21 mg/24hr patch PLACE 1 PATCH ONTO SKIN DAILY   nystatin cream (MYCOSTATIN) APPLY TOPICALLY TWICE A DAY   selenium sulfide (SELSUN) 2.5 % shampoo 1 application, Topical, Daily PRN   sodium chloride (OCEAN) 0.65 % SOLN nasal spray 1 spray, Each Nare, As needed   umeclidinium bromide (INCRUSE ELLIPTA) 62.5 MCG/INH AEPB 1 puff, Inhalation, Daily    REVIEW OF SYSTEMS: Review of Systems  Constitutional: Positive for malaise/fatigue. Negative for chills and fever.  HENT:  Negative for hoarse voice and nosebleeds.   Eyes:  Negative for discharge, double vision and pain.  Cardiovascular:  Positive for dyspnea on exertion and leg swelling. Negative for chest pain, claudication, near-syncope, orthopnea, palpitations, paroxysmal nocturnal dyspnea and syncope.  Respiratory:  Positive for shortness of breath. Negative for hemoptysis.   Musculoskeletal:  Negative for muscle cramps and myalgias.  Gastrointestinal:  Negative for  abdominal pain, constipation, diarrhea, hematemesis, hematochezia, melena, nausea and vomiting.  Neurological:  Negative for dizziness and light-headedness.  All other systems reviewed and are negative.  PHYSICAL EXAM: Vitals with BMI 07/03/2021 07/03/2021 07/03/2021  Height - - -  Weight - - -  BMI - - -  Systolic 123XX123 [id number]  Diastolic [id number] 84 46  Pulse 30 30 50    No intake or output data in the 24 hours ending 07/03/21 0800  Net IO Since Admission: No IO data has been entered for this period [07/03/21 0800]  CONSTITUTIONAL: Appears older than stated age, hemodynamically stable, no acute distress. SKIN: Skin is warm and dry. No rash noted. No cyanosis. No pallor. No jaundice HEAD: Normocephalic and atraumatic. EYES: No scleral icterus MOUTH/THROAT: Moist oral membranes. NECK: No JVD present. No thyromegaly noted. No carotid bruits LYMPHATIC: No visible cervical adenopathy. CHEST Normal respiratory effort. No intercostal retractions LUNGS: Decreased breath sounds bilaterally with expiratory wheezes present.  No stridor. No rales. CARDIOVASCULAR: Regular, positive S1-S2, no murmurs rubs or gallops appreciated. ABDOMINAL: Obese, soft, nontender, nondistended, positive bowel sounds in all 4 quadrants, no apparent ascites. EXTREMITIES: +1 bilateral peripheral edema,  very faint  and DP PT pulses bilaterally. HEMATOLOGIC: No significant bruising NEUROLOGIC: Oriented to person, place, and time. Nonfocal. Normal muscle tone. PSYCHIATRIC: Normal mood and affect. Normal behavior. Cooperative  RADIOLOGY: DG Chest Portable 1 View  Result Date: 07/02/2021 CLINICAL DATA:  65 year old male with chest pain. EXAM: PORTABLE CHEST 1 VIEW COMPARISON:  Chest radiograph dated 12/06/2015 and CT dated 11/28/2016. FINDINGS: No focal consolidation, pleural effusion, pneumothorax. There is mild cardiomegaly with mild central vascular congestion. No acute osseous pathology. Lower cervical ACDF. IMPRESSION:  Mild cardiomegaly with mild central vascular congestion. No focal consolidation. Electronically Signed   By: Anner Crete M.D.   On: 07/02/2021 19:53    LABORATORY DATA: Lab Results  Component Value Date   WBC 15.5 (H) 07/03/2021   HGB 12.3 (L) 07/03/2021   HCT 39.5 07/03/2021   MCV 91.4 07/03/2021   PLT 210 07/03/2021    Recent Labs  Lab 07/02/21 2013 07/02/21 2105 07/03/21 0438  NA 136   < > 136  K 3.8   < > 3.9  CL 107   < > 106  CO2 22  --  20*  BUN 14   < > 15  CREATININE 1.06   < > 0.99  CALCIUM 8.6*  --  8.4*  PROT 6.2*  --   --   BILITOT 0.6  --   --   ALKPHOS 88  --   --   ALT 39  --   --   AST 24  --   --   GLUCOSE 91   < > 88   < > = values in this interval not displayed.    Lipid Panel  Lab Results  Component Value Date   CHOL 146 04/02/2021   HDL 35 (L) 04/02/2021   LDLCALC 86 04/02/2021   LDLDIRECT 85 07/22/2012   TRIG 140 04/02/2021   CHOLHDL 4.2 04/02/2021     BNP (last 3 results) Recent Labs    07/02/21 2013  BNP 457.0*    HEMOGLOBIN A1C Lab Results  Component Value Date   HGBA1C 6.1 08/04/2017    Cardiac Panel (last 3 results) Recent Labs    07/02/21 2013 07/02/21 2104  TROPONINIHS 14 12     TSH Recent Labs    07/02/21 2013  TSH 1.306     CARDIAC DATABASE: EKG: 07/02/2021: Normal sinus rhythm, 62 bpm, left axis, left anterior fascicular block, right bundle branch block, second-degree type II AV block, without underlying injury pattern.   07/02/2021: Sinus bradycardia, 54 bpm, right bundle branch block, left axis, left anterior fascicular block, second-degree type II AV block with ventricular escape beats.  Echocardiogram: None  Stress Testing:   None  Heart Catheterization:  None  IMPRESSION & RECOMMENDATIONS: George Leon is a 65 y.o. Caucasian male whose past medical history and cardiovascular risk factors include: Hypertension, hyperlipidemia, tobacco abuse, COPD, obesity due to excess calories,  questionable history of PVD.  Symptomatic bradycardia: No identifiable reversible cause. Electrolytes and TSH within normal limits EKG shows underlying sinus rhythm with second-degree type II and evidence of high degree AV block in the setting of underlying right bundle branch and left anterior fascicular block.  I suspect that he is having underlying progressive conduction disease that is significantly impacting his dyspnea on exertion.  Will consult cardiac electrophysiologist Dr. Thompson Grayer for evaluation of symptomatic bradycardia and possible pacemaker.  Spoke to him earlier this morning and after reviewing Mr. Mertie Clause case the recommendations was to schedule him for a  left heart catheterization to rule out an ischemic substrate. Patient is scheduled for left heart catheterization, discussed risks, benefits, and alternatives. The procedure of left heart catheterization with possible intervention was explained to the patient in detail.  The indication, alternatives, risks and benefits were reviewed.  Complications include but not limited to bleeding, infection, vascular injury, stroke, myocardial infection, arrhythmia, kidney injury, radiation-related injury in the case of prolonged fluoroscopy use, emergency cardiac surgery, and death. The patient understands the risks of serious complication is 1-2 in 123XX123 with diagnostic cardiac cath and 1-2% or less with angioplasty/stenting. The patient voices understanding and provides verbal feedback and wishes to proceed with coronary angiography with possible PCI. Left heart catheterization power plan initiated. Plan echocardiogram Avoid AV nodal blocking agents. Patient is asymptomatic at rest, hemodynamics are stable, no current indications for transvenous pacing at this time. Would recommend keeping transcutaneous pads at bedside.   Acute heart failure exacerbation: Newly diagnosed, echo pending BNP elevated, chest x-ray notes vascular  congestion, and patient does have effort related dyspnea and lower extremity swelling. Continue Lasix 40 mg p.o. daily. Recommend checking fasting lipid profile, hemoglobin A1c.  Strict I's and O's and daily weights. Will uptitrate GDMT once the echo results are available and coronary anatomy is known.  Hyperlipidemia: Continue statin therapy and Zetia.  Continue your care given his other chronic comorbid conditions.  CRITICAL CARE Performed by: Rex Kras   Total critical care time: 55 minutes   Critical care time was exclusive of separately billable procedures and treating other patients.   Critical care was necessary to treat or prevent imminent or life-threatening deterioration.   Critical care was time spent personally by me on the following activities: development of treatment plan with patient and/or surrogate as well as nursing, discussions with consultants, evaluation of patient's response to treatment, examination of patient, obtaining history from patient or surrogate, ordering and performing treatments and interventions, ordering and review of laboratory studies, ordering and review of radiographic studies, pulse oximetry and re-evaluation of patient's condition.  Patient's questions and concerns were addressed to his satisfaction. He voices understanding of the instructions provided during this encounter.   This note was created using a voice recognition software as a result there may be grammatical errors inadvertently enclosed that do not reflect the nature of this encounter. Every attempt is made to correct such errors.  Rex Kras, [place], [facility]  Pager: [phone] Office: [phone] 07/03/2021, 8:00 AM

## 2021-07-03 NOTE — ED Notes (Addendum)
RN paged Dr. Carmina Miller about cath lab orders.

## 2021-07-04 ENCOUNTER — Encounter (HOSPITAL_COMMUNITY): Payer: Self-pay | Admitting: Cardiology

## 2021-07-04 ENCOUNTER — Encounter (HOSPITAL_COMMUNITY)
Admission: EM | Disposition: A | Discharge status: Home / Self Care | Payer: Self-pay | Source: Ambulatory Visit | Attending: Family Medicine

## 2021-07-04 DIAGNOSIS — Z950 Presence of cardiac pacemaker: Secondary | ICD-10-CM

## 2021-07-04 DIAGNOSIS — Z45018 Encounter for adjustment and management of other part of cardiac pacemaker: Secondary | ICD-10-CM

## 2021-07-04 DIAGNOSIS — I442 Atrioventricular block, complete: Secondary | ICD-10-CM

## 2021-07-04 HISTORY — DX: Presence of cardiac pacemaker: Z95.0

## 2021-07-04 HISTORY — DX: Encounter for adjustment and management of other part of cardiac pacemaker: Z45.018

## 2021-07-04 HISTORY — PX: PACEMAKER IMPLANT: EP1218

## 2021-07-04 LAB — CBC
HCT: 39.5 % (ref 39.0–52.0)
Hemoglobin: 12.7 g/dL — ABNORMAL LOW (ref 13.0–17.0)
MCH: 28.6 pg (ref 26.0–34.0)
MCHC: 32.2 g/dL (ref 30.0–36.0)
MCV: 89 fL (ref 80.0–100.0)
Platelets: 229 10*3/uL (ref 150–400)
RBC: 4.44 MIL/uL (ref 4.22–5.81)
RDW: 14.1 % (ref 11.5–15.5)
WBC: 14.6 10*3/uL — ABNORMAL HIGH (ref 4.0–10.5)
nRBC: 0 % (ref 0.0–0.2)

## 2021-07-04 LAB — COMPREHENSIVE METABOLIC PANEL
ALT: 32 U/L (ref 0–44)
AST: 27 U/L (ref 15–41)
Albumin: 3.2 g/dL — ABNORMAL LOW (ref 3.5–5.0)
Alkaline Phosphatase: 79 U/L (ref 38–126)
Anion gap: 9 (ref 5–15)
BUN: 13 mg/dL (ref 8–23)
CO2: 23 mmol/L (ref 22–32)
Calcium: 8.7 mg/dL — ABNORMAL LOW (ref 8.9–10.3)
Chloride: 105 mmol/L (ref 98–111)
Creatinine, Ser: 0.78 mg/dL (ref 0.61–1.24)
GFR, Estimated: >60 mL/min (ref >60)
Glucose, Bld: 106 mg/dL — ABNORMAL HIGH (ref 70–99)
Potassium: 3.6 mmol/L (ref 3.5–5.1)
Sodium: 137 mmol/L (ref 135–145)
Total Bilirubin: 0.7 mg/dL (ref 0.3–1.2)
Total Protein: 5.9 g/dL — ABNORMAL LOW (ref 6.5–8.1)

## 2021-07-04 SURGERY — PACEMAKER IMPLANT

## 2021-07-04 MED ORDER — SODIUM CHLORIDE 0.9 % IV SOLN: INTRAVENOUS | Status: DC | PRN

## 2021-07-04 MED ORDER — FENTANYL CITRATE (PF) 100 MCG/2ML IJ SOLN
INTRAMUSCULAR | Status: AC
  Filled 2021-07-04: qty 2

## 2021-07-04 MED ORDER — CEFAZOLIN SODIUM-DEXTROSE 1-4 GM/50ML-% IV SOLN
1.0000 g | Freq: Four times a day (QID) | INTRAVENOUS | Status: AC
  Administered 2021-07-04 – 2021-07-05 (×3): 1 g via INTRAVENOUS
  Filled 2021-07-04 (×4): qty 50

## 2021-07-04 MED ORDER — CEFAZOLIN SODIUM-DEXTROSE 2-4 GM/100ML-% IV SOLN
INTRAVENOUS | Status: AC
  Filled 2021-07-04: qty 100

## 2021-07-04 MED ORDER — OXYCODONE HCL 5 MG PO TABS
2.5000 mg | ORAL_TABLET | Freq: Once | ORAL | Status: AC
  Administered 2021-07-05: 2.5 mg via ORAL
  Filled 2021-07-04: qty 1

## 2021-07-04 MED ORDER — FENTANYL CITRATE (PF) 100 MCG/2ML IJ SOLN
INTRAMUSCULAR | Status: DC | PRN
  Administered 2021-07-04 (×3): 12.5 ug via INTRAVENOUS

## 2021-07-04 MED ORDER — HEPARIN (PORCINE) IN NACL 1000-0.9 UT/500ML-% IV SOLN
INTRAVENOUS | Status: AC
  Filled 2021-07-04: qty 500

## 2021-07-04 MED ORDER — ACETAMINOPHEN 325 MG PO TABS: 325.0000 mg | ORAL_TABLET | ORAL | Status: DC | PRN

## 2021-07-04 MED ORDER — ONDANSETRON HCL 4 MG/2ML IJ SOLN: 4.0000 mg | Freq: Four times a day (QID) | INTRAMUSCULAR | Status: DC | PRN

## 2021-07-04 MED ORDER — LOSARTAN POTASSIUM 50 MG PO TABS
100.0000 mg | ORAL_TABLET | Freq: Every day | ORAL | Status: DC
  Administered 2021-07-04: 100 mg via ORAL
  Filled 2021-07-04 (×2): qty 2

## 2021-07-04 MED ORDER — CEFAZOLIN SODIUM-DEXTROSE 2-3 GM-%(50ML) IV SOLR
INTRAVENOUS | Status: AC | PRN
  Administered 2021-07-04: 2 g via INTRAVENOUS

## 2021-07-04 MED ORDER — SODIUM CHLORIDE 0.9 % IV SOLN
INTRAVENOUS | Status: AC
  Filled 2021-07-04: qty 2

## 2021-07-04 MED ORDER — SODIUM CHLORIDE 0.9 % IV SOLN: INTRAVENOUS | Status: DC

## 2021-07-04 MED ORDER — HEPARIN (PORCINE) IN NACL 1000-0.9 UT/500ML-% IV SOLN
INTRAVENOUS | Status: DC | PRN
  Administered 2021-07-04: 500 mL

## 2021-07-04 MED ORDER — LIDOCAINE HCL (PF) 1 % IJ SOLN
INTRAMUSCULAR | Status: DC | PRN
  Administered 2021-07-04: 60 mL

## 2021-07-04 MED ORDER — LIDOCAINE HCL (PF) 1 % IJ SOLN
INTRAMUSCULAR | Status: AC
  Filled 2021-07-04: qty 90

## 2021-07-04 MED ORDER — MIDAZOLAM HCL 5 MG/5ML IJ SOLN
INTRAMUSCULAR | Status: AC
  Filled 2021-07-04: qty 5

## 2021-07-04 MED ORDER — MIDAZOLAM HCL 5 MG/5ML IJ SOLN
INTRAMUSCULAR | Status: DC | PRN
  Administered 2021-07-04 (×3): 1 mg via INTRAVENOUS

## 2021-07-04 SURGICAL SUPPLY — 12 items
CABLE SURGICAL S-101-97-12 (CABLE) ×2 IMPLANT
KIT ACCESSORY SELECTRA FIX CVD (MISCELLANEOUS) ×1 IMPLANT
LEAD SELECTRA 3D-55-42 (CATHETERS) ×1 IMPLANT
LEAD SOLIA S PRO MRI 53 (Lead) ×1 IMPLANT
LEAD SOLIA S PRO MRI 60 (Lead) ×1 IMPLANT
MAT PREVALON FULL STRYKER (MISCELLANEOUS) ×1 IMPLANT
PACEMAKER EDORA 8DR-T MRI (Pacemaker) ×1 IMPLANT
PAD PRO RADIOLUCENT 2001M-C (PAD) ×2 IMPLANT
SHEATH 7FR PRELUDE SNAP 13 (SHEATH) ×2 IMPLANT
SHEATH 9FR PRELUDE SNAP 13 (SHEATH) ×1 IMPLANT
TRAY PACEMAKER INSERTION (PACKS) ×2 IMPLANT
WIRE HI TORQ VERSACORE-J 145CM (WIRE) ×1 IMPLANT

## 2021-07-04 NOTE — Discharge Summary (Signed)
[facility] Discharge Summary  Patient name: George Leon Medical record number: [id number] Date of birth: [birth year] Age: 65 y.o. Gender: male Date of Admission: 07/02/2021  Date of Discharge: 07/05/21 Admitting Physician: Martyn Malay, MD  Primary Care Provider: Alen Bleacher, MD Consultants: Cardiology  Indication for Hospitalization: Symptomatic Bradycardia  Discharge Diagnoses/Problem List:  Symptomatic Bradycardia  Disposition: Home  Discharge Condition: Stable  Discharge Exam: Per Dr. Adah Salvage on 07/05/2021 Blood pressure (!) 181/91, pulse 84, temperature 97.9 F (36.6 C), temperature source Oral, resp. rate 16, height '5\' 8"'$  (1.727 m), weight 119.8 kg, SpO2 96 %.  General: Alert, well developed, NAD Cardiovascular: RRR,  Respiratory: CTAB, scattered wheezing Abdomen: No ascities or tenderness Extremities: +1 LE edema bilaterally  Brief Hospital Course:  George Leon is a 65 year old male presents with symptomatic bradycardia with episodes of dizziness.  Past medical history is significant for HTN, CAD, peripheral vascular disease, diastolic heart failure, chronic bronchitis, COPD, and GERD  Symptomatic bradycardia with complete heart block Patient presented to the ED due to persistent symptomatic bradycardia with episodes of dizziness.  He said his apple watch shows his heart rate to be in the 30s and 40s.  He was sent to ED by his cardiologist at his outpatient appointment which showed him to have a second-degree type II AV block, RBBB and left anterior fascicular block in the ED repeat EKG showed third-degree block with ventricular rate of 40 bpm.  Other labs showed BNP of 457, troponin TSH and magnesium WNL. CXR showed mild central vascular congestion.  Cardiology was consulted who performed a left cardiac catheterization which showed no signs of vascular block.  Following the catheterization cardiology performed a pacemaker placement  procedure.  This procedure was performed successfully and follow-up EKG showed appropriate rhythm with resolution of the complete heart block.  Mild CHF exacerbation Patient on arrival in the ED complaint of persistent dyspnea and lower extremity edema for over 2 to 3 weeks.  On physical exam patient had bilateral crackles of and +2 LE edema.  Patient's management include strict I's/O's, and p.o Lasix 40 mg daily.  Patient was discharged on Lasix 40 mg daily.  PCP follow-up task -Discharged on Lasix 40 mg daily.  Recommend BMP check at follow-up. -Patient with pacemaker placed during this hospitalization.  Recommend continue to follow at appropriate intervals with cardiology.   Significant Procedures:  Cardiac Catheterization (07/03/21) Pacemaker Implant (07/05/21)   Significant Labs and Imaging:  Recent Labs  Lab 07/03/21 0438 07/04/21 0222 07/05/21 0326  WBC 15.5* 14.6* 12.5*  HGB 12.3* 12.7* 12.7*  HCT 39.5 39.5 39.1  PLT 210 229 197   Recent Labs  Lab 07/02/21 2013 07/02/21 2105 07/03/21 0438 07/04/21 0222 07/05/21 0326  NA 136 137 136 137 136  K 3.8 3.8 3.9 3.6 3.6  CL 107 105 106 105 103  CO2 22  --  20* 23 26  GLUCOSE 91 88 88 106* 108*  BUN '14 15 15 13 '$ 7*  CREATININE 1.06 1.00 0.99 0.78 0.71  CALCIUM 8.6*  --  8.4* 8.7* 8.6*  MG 2.0  --  2.0  --   --   ALKPHOS 88  --   --  79  --   AST 24  --   --  27  --   ALT 39  --   --  32  --   ALBUMIN 3.4*  --   --  3.2*  --    LEFT HEART  CATH AND CORONARY ANGIOGRAPHY (07/03/21) No significant CAD.  Moderately elevated EDP. LV: 125 mmHg / 12 mmHg, EDP 22 mmHg.  Aortic pressure 109/46 with a mean of 69 mmHg.  There was no pressure gradient across the aortic valve.  LVEDP moderately elevated.  LVEF 65%, no significant MR. LM: Large vessel, normal. LAD: Very large vessel, gives origin to 3 large diagonals, very mild disease is evident with mild calcification in the proximal segment. CX: Moderate sized vessel, smooth and  normal. RCA: Very large vessel, PL branch is very large.  There is mild disease in the distal RCA.  Mild calcification is evident in the proximal segment.   2D Echo, Cardiac Doppler, Color Doppler and 3D Echo (07/03/21) IMPRESSIONS   1. Left ventricular ejection fraction, by estimation, is 60 to 65%. The  left ventricle has normal function. The left ventricle has no regional  wall motion abnormalities. There is mild concentric left ventricular  hypertrophy. Left ventricular diastolic  parameters are consistent with Grade I diastolic dysfunction (impaired  relaxation).   2. Right ventricular systolic function is normal. The right ventricular  size is normal. There is mildly elevated pulmonary artery systolic  pressure.   3. The mitral valve is normal in structure. Mild mitral valve  regurgitation. No evidence of mitral stenosis.   4. The aortic valve is tricuspid. Aortic valve regurgitation is not  visualized. No aortic stenosis is present.   5. The inferior vena cava is dilated in size with <50% respiratory  variability, suggesting right atrial pressure of 15 mmHg.    Results/Tests Pending at Time of Discharge: None  Discharge Medications:  Allergies as of 07/05/2021       Reactions   Adhesive [tape] Rash   Paper tape is what bothers him Pulled skin off also   Hydrochlorothiazide Other (See Comments)   Back Pain & gives him kidney stones   Sulfamethoxazole Swelling, Other (See Comments)   Tongue Swelling. Pt states he has had a sulfa drug since and has tolerated them.        Medication List     TAKE these medications    acetaminophen 325 MG tablet Commonly known as: TYLENOL Take 2 tablets (650 mg total) by mouth every 4 (four) hours as needed for headache or mild pain.   albuterol 108 (90 Base) MCG/ACT inhaler Commonly known as: ProAir HFA Inhale 1-2 puffs into the lungs every 4 (four) hours as needed. As needed for shortness of breath /wheeze/cough   amLODipine 5 MG  tablet Commonly known as: NORVASC TAKE 1 TABLET DAILY   atorvastatin 80 MG tablet Commonly known as: LIPITOR TAKE 1 TABLET DAILY What changed: when to take this   DULoxetine 60 MG capsule Commonly known as: CYMBALTA TAKE 1 CAPSULE DAILY What changed:  how much to take how to take this when to take this   esomeprazole 40 MG capsule Commonly known as: NEXIUM TAKE 1 CAPSULE DAILY AT 12 NOON What changed: See the new instructions.   ezetimibe 10 MG tablet Commonly known as: Zetia Take 1 tablet (10 mg total) by mouth daily.   furosemide 40 MG tablet Commonly known as: LASIX Take 1 tablet (40 mg total) by mouth daily. Start taking on: July 06, 2021   HYDROcodone-acetaminophen 7.5-325 MG tablet Commonly known as: Norco Take 1 tablet by mouth every 6 (six) hours as needed for moderate pain.   Incruse Ellipta 62.5 MCG/INH Aepb Generic drug: umeclidinium bromide Inhale 1 puff into the lungs daily.  losartan 100 MG tablet Commonly known as: COZAAR TAKE 1 TABLET AT BEDTIME What changed: when to take this   Medical Compression Stockings Misc 1 each by Does not apply route once.   naloxone 0.4 MG/ML injection Commonly known as: NARCAN Use as needed for signs of overdose.   nicotine 21 mg/24hr patch Commonly known as: [employer] Nicotine PLACE 1 PATCH ONTO SKIN DAILY   nystatin cream Commonly known as: MYCOSTATIN APPLY TOPICALLY TWICE A DAY What changed: how much to take   Ocuvite Adult Formula Caps Take 1 capsule by mouth daily.   selenium sulfide 2.5 % shampoo Commonly known as: SELSUN Apply 1 application topically daily as needed for irritation.   sodium chloride 0.65 % Soln nasal spray Commonly known as: OCEAN Place 1 spray into both nostrils as needed for congestion.        Discharge Instructions: Please refer to Patient Instructions section of EMR for full details.  Patient was counseled important signs and symptoms that should prompt return to medical  care, changes in medications, dietary instructions, activity restrictions, and follow up appointments.   Follow-Up Appointments:  Follow-up Information     [place] Office Follow up.   Specialty: Cardiology Why: 07/18/21 @ 10:00AM, wound check visit Contact information: [address] [place]        Evans Lance, MD Follow up.   Specialty: Cardiology Why: 10/09/21 @ 11:30AM Contact information: [id number] [place] [address] [phone]         Alen Bleacher, MD. Daphane Shepherd on 07/09/2021.   Specialty: Student Why: At 11am. Please arrive by 10:45am. This is your hospital follow up appointment with your new primary care doctor. If this day and time does not work for you, please call the office directly to reschedule. Contact information: [place] [address] [phone]         Rex Kras, DO. Go on 07/18/2021.   Specialties: Cardiology, Vascular Surgery Why: 11:30am Contact information: [place] [address] [place], [place], DO 07/05/2021, 4:57 PM PGY-3, [place]

## 2021-07-04 NOTE — Evaluation (Signed)
Physical Therapy Evaluation Patient Details Name: George Leon MRN: [id number] DOB: [birth year] Today's Date: 07/04/2021   History of Present Illness  65 y.o. male presents to [facility] ED on 07/02/2021 with symptomatic bradycardia, found to have CHB. Pt underwent left heart cath on 07/03/2021. PPM placement on 07/04/2021. PMH includes HTN, GERD, chronic bronchitis, peripheral vascular disease, diastolic heart failure, CAD, pulmonary hypertension, HLD, COPD.  Clinical Impression  Pt presents to PT with deficits in activity tolerance, dynamic gait and balance, and knowledge of pacemaker precautions. Pt is able to verbally recall precautions at end of session but does require cues when implementing precautions when mobilizing. Pt will benefit from continued acute PT services to improve activity tolerance and dynamic balance. PT recommends no PT services at the time of discharge.    Follow Up Recommendations No PT follow up;Supervision - Intermittent    Equipment Recommendations  None recommended by PT    Recommendations for Other Services       Precautions / Restrictions Precautions Precautions: Fall;ICD/Pacemaker Required Braces or Orthoses: Sling Restrictions Weight Bearing Restrictions: Yes LUE Weight Bearing: Non weight bearing      Mobility  Bed Mobility Overal bed mobility: Needs Assistance Bed Mobility: Supine to Sit     Supine to sit: Supervision     General bed mobility comments: supervision for maintenance of precautions    Transfers Overall transfer level: Needs assistance Equipment used: None Transfers: Sit to/from Stand Sit to Stand: Supervision            Ambulation/Gait Ambulation/Gait assistance: Supervision Gait Distance (Feet): 100 Feet Assistive device: None Gait Pattern/deviations: Step-through pattern Gait velocity: functional Gait velocity interpretation: 1.31 - 2.62 ft/sec, indicative of limited community ambulator General Gait Details: pt with  slowed step-through gait  Stairs            Wheelchair Mobility    Modified Rankin (Stroke Patients Only)       Balance Overall balance assessment: Needs assistance Sitting-balance support: No upper extremity supported;Feet supported Sitting balance-Leahy Scale: Good     Standing balance support: No upper extremity supported Standing balance-Leahy Scale: Good                               Pertinent Vitals/Pain Pain Assessment: Faces Faces Pain Scale: Hurts little more Pain Location: PPM incision site Pain Descriptors / Indicators: Grimacing Pain Intervention(s): Monitored during session    Home Living Family/patient expects to be discharged to:: Private residence Living Arrangements: Spouse/significant other;Children Available Help at Discharge: Family;Available 24 hours/day Type of Home: House Home Access: Stairs to enter Entrance Stairs-Rails: None Entrance Stairs-Number of Steps: 3 Home Layout: One level Home Equipment: Walker - 2 wheels;Cane - single point;Wheelchair - manual;Shower seat      Prior Function Level of Independence: Needs assistance   Gait / Transfers Assistance Needed: pt reports independent ambulation, PRN use of cane. Pt does require assistance with stair negotiation  ADL's / Homemaking Assistance Needed: pt is independent with ADLs at baseline        Hand Dominance   Dominant Hand: Right    Extremity/Trunk Assessment   Upper Extremity Assessment Upper Extremity Assessment: Defer to OT evaluation    Lower Extremity Assessment Lower Extremity Assessment: Overall WFL for tasks assessed    Cervical / Trunk Assessment Cervical / Trunk Assessment: Normal  Communication   Communication: No difficulties  Cognition Arousal/Alertness: Awake/alert Behavior During Therapy: WFL for tasks assessed/performed Overall Cognitive  Status: Within Functional Limits for tasks assessed                                         General Comments General comments (skin integrity, edema, etc.): VSS on RA    Exercises     Assessment/Plan    PT Assessment Patient needs continued PT services  PT Problem List Decreased activity tolerance;Decreased balance;Decreased mobility;Decreased knowledge of precautions       PT Treatment Interventions DME instruction;Gait training;Stair training;Functional mobility training;Therapeutic activities;Therapeutic exercise;Balance training;Patient/family education;Cognitive remediation    PT Goals (Current goals can be found in the Care Plan section)  Acute Rehab PT Goals Patient Stated Goal: to go home PT Goal Formulation: With patient/family Time For Goal Achievement: 07/18/21 Potential to Achieve Goals: Good    Frequency Min 3X/week   Barriers to discharge        Co-evaluation               AM-PAC PT "6 Clicks" Mobility  Outcome Measure Help needed turning from your back to your side while in a flat bed without using bedrails?: A Little Help needed moving from lying on your back to sitting on the side of a flat bed without using bedrails?: A Little Help needed moving to and from a bed to a chair (including a wheelchair)?: A Little Help needed standing up from a chair using your arms (e.g., wheelchair or bedside chair)?: A Little Help needed to walk in hospital room?: A Little Help needed climbing 3-5 steps with a railing? : A Little 6 Click Score: 18    End of Session   Activity Tolerance: Patient tolerated treatment well Patient left: in chair;with call bell/phone within reach;with family/visitor present Nurse Communication: Mobility status PT Visit Diagnosis: Other abnormalities of gait and mobility (R26.89)    Time: [id number] PT Time Calculation (min) (ACUTE ONLY): 18 min   Charges:   PT Evaluation $PT Eval Low Complexity: [place], PT, DPT Acute Rehabilitation Pager: [phone]   Zenaida Niece 07/04/2021, 2:24  PM

## 2021-07-04 NOTE — H&P (Signed)
Cardiology Consultation:    Patient ID: George Leon MRN: [id number]; DOB: 05/03/1956   Admit date: 07/02/2021 Date of Consult: 07/03/2021   PCP:  Alen Bleacher, MD              [facility] Providers Cardiologist:  Dr. Terri Skains     Patient Profile:    George Leon is a 65 y.o. male with a hx of HTN, HLD unclear hx of PVD, obesity, COPD, smoker who is being seen 07/03/2021 for the evaluation of advanced heart block at the request of Dr. Terri Skains.   History of Present Illness:    George Leon was seen yesterday out pt by Dr. Terri Skains for c/o SOB with what seems a known diastolic HF hx and COPD.  He was planned to enrol in an out pt weight loss program and required MD clearance, this prompted the cardiac consult. SOB dated back years though has been progressive to needing to rest often and has been increasingly swollen as well.   He was found in Mobitz II heart block rates 50's and ambulated in the office with stable HR though persistent block Not on any nodal blocking agents referred to the ER, planned for LHC and EP evaluation.   LABS K+ 3.8, 3..9 BUN/Creat 15/0.99 Mag 2.0 WBC 15.5 H/H 12/39 Plts 210   HS Trop 12     Echo completed LVEF 60-65%, no WMA, grade I DD   The patient and his wife reports years of some degree of SOB thugh this progressively worse over the last few months with some short/episodes of CP that he says feels electrical and fleeting, they are also random, though also happening more. In the last 3-4 days his SOB has acutely worsened and noted as well increased swelling legs/feet   He has had some dizzy spells when exerting himself and his wife rep[orts he will look pale, but has not had any near syncope or syncope. He has been sleeping in the recliner of late 2/2 SOB         Past Medical History:  Diagnosis Date   Acute MI ([place])     Acute on chronic diastolic heart failure ([place]) 12/01/2015   Anxiety     Arthritis      "qwhere" (08/06/2017)   CHF  (congestive heart failure) ([place]) dx'd 2016   Chronic back pain      "all my back" (08/06/2017)   Chronic bronchitis ([facility])     Chronic pain     Chronic pain syndrome 12/01/2015   COPD (chronic obstructive pulmonary disease) ([facility])     Dyspnea      w/ exertion   Emphysema of lung ([facility])     High cholesterol     History of hiatal hernia      possible     Hypertension     Liver fatty degeneration     Macular degeneration, right eye     Pneumonia  1990s X 1; 2016           Past Surgical History:  Procedure Laterality Date   ANTERIOR CERVICAL DECOMP/DISCECTOMY FUSION   1988    "plate; cadavar bone; couple bolts"   BACK SURGERY       CARDIAC CATHETERIZATION       JOINT REPLACEMENT       KNEE ARTHROSCOPY Left 05/27/2017    Procedure: LEFT KNEE ARTHROSCOPY WITH PARTIAL MEDIAL MENISCECTOMY;  Surgeon: Mcarthur Rossetti, MD;  Location: [place];  Service: Orthopedics;  Laterality: Left;  SHOULDER ARTHROSCOPY WITH ROTATOR CUFF REPAIR Right     TOTAL HIP ARTHROPLASTY Left 08/05/2017    Procedure: LEFT TOTAL HIP ARTHROPLASTY ANTERIOR APPROACH;  Surgeon: Mcarthur Rossetti, MD;  Location: [place];  Service: Orthopedics;  Laterality: Left;      Home Medications:         Prior to Admission medications  Medication Sig Start Date End Date Taking? Authorizing Provider  albuterol (PROAIR HFA) 108 (90 Base) MCG/ACT inhaler Inhale 1-2 puffs into the lungs every 4 (four) hours as needed. As needed for shortness of breath /wheeze/cough 03/26/21   Yes Meccariello, Bernita Raisin, DO  amLODipine (NORVASC) 5 MG tablet TAKE 1 TABLET DAILY Patient taking differently: Take 5 mg by mouth daily. 09/19/20   Yes Meccariello, Bernita Raisin, DO  atorvastatin (LIPITOR) 80 MG tablet TAKE 1 TABLET DAILY Patient taking differently: Take 80 mg by mouth every evening. 09/19/20   Yes Meccariello, Bernita Raisin, DO  DULoxetine (CYMBALTA) 60 MG capsule TAKE 1 CAPSULE DAILY Patient taking differently: Take 60 mg by mouth daily. TAKE 1  CAPSULE DAILY 09/19/20   Yes Meccariello, Bernita Raisin, DO  Elastic Bandages & Supports ([place]) [place] 1 each by Does not apply route once. 06/19/15   Yes Patrecia Pour, MD  esomeprazole (NEXIUM) 40 MG capsule TAKE 1 CAPSULE DAILY AT 12 NOON Patient taking differently: Take 40 mg by mouth daily. 09/19/20   Yes Meccariello, Bernita Raisin, DO  ezetimibe (ZETIA) 10 MG tablet Take 1 tablet (10 mg total) by mouth daily. 04/04/21   Yes Meccariello, Bernita Raisin, DO  HYDROcodone-acetaminophen (NORCO) 7.5-325 MG tablet Take 1 tablet by mouth every 6 (six) hours as needed for moderate pain. 06/30/21   Yes Alen Bleacher, MD  losartan (COZAAR) 100 MG tablet TAKE 1 TABLET AT BEDTIME Patient taking differently: Take 100 mg by mouth daily. 08/21/20   Yes Meccariello, Bernita Raisin, DO  Multiple Vitamins-Minerals (OCUVITE ADULT FORMULA) CAPS Take 1 capsule by mouth daily. 05/22/15   Yes Patrecia Pour, MD  naloxone [facility]) 0.4 MG/ML injection Use as needed for signs of overdose. 01/16/21   Yes Meccariello, Bernita Raisin, DO  nicotine ([facility] NICOTINE) 21 mg/24hr patch PLACE 1 PATCH ONTO SKIN DAILY 06/30/21   Yes Alen Bleacher, MD  nystatin cream (MYCOSTATIN) APPLY TOPICALLY TWICE A DAY Patient taking differently: Apply 1 application topically 2 (two) times daily. 04/05/21   Yes Meccariello, Bernita Raisin, DO  selenium sulfide (SELSUN) 2.5 % shampoo Apply 1 application topically daily as needed for irritation. 01/15/21   Yes Meccariello, Bernita Raisin, DO  sodium chloride (OCEAN) 0.65 % SOLN nasal spray Place 1 spray into both nostrils as needed for congestion.     Yes [provider]  umeclidinium bromide (INCRUSE ELLIPTA) 62.5 MCG/INH AEPB Inhale 1 puff into the lungs daily. 07/04/20   Yes Meccariello, Bernita Raisin, DO      Inpatient Medications: Scheduled Meds:  aspirin  81 mg Oral Pre-Cath   atorvastatin  80 mg Oral Daily   DULoxetine  60 mg Oral Daily   ezetimibe  10 mg Oral Daily   furosemide  40 mg Oral Daily   nicotine   21 mg Transdermal Daily   pantoprazole  40 mg Oral Daily   sodium chloride flush  3 mL Intravenous Q12H   umeclidinium bromide  1 puff Inhalation Daily    Continuous Infusions:  sodium chloride     sodium chloride Stopped (07/03/21 1258)    Followed by   sodium  chloride Stopped (07/03/21 1259)    PRN Meds: sodium chloride, albuterol, HYDROcodone-acetaminophen, naloxone, sodium chloride, sodium chloride flush   Allergies:         Allergies  Allergen Reactions   Adhesive [Tape] Rash      Paper tape is what bothers him Pulled skin off also   Hydrochlorothiazide Other (See Comments)      Back Pain & gives him kidney stones   Sulfamethoxazole Swelling and Other (See Comments)      Tongue Swelling. Pt states he has had a sulfa drug since and has tolerated them.      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   Drug use: Yes      Types: Marijuana      Comment: 08/06/2017 "last time was 07/2017; none in years before that"   Sexual activity: Never  Other Topics Concern   Not on file  Social History Narrative   Not on file    Social Determinants of Health    Financial Resource Strain: Not on file  Food Insecurity: Not on file  Transportation Needs: Not on file  Physical Activity: Not on file  Stress: Not on file  Social Connections: Not on file  Intimate Partner Violence: Not on file    Family History:        Family History  Problem  Relation Age of Onset   Hypertension Father     AAA (abdominal aortic aneurysm) Father        ROS:  Please see the history of present illness.   All other ROS reviewed and negative.      Physical Exam/Data:          Vitals:    07/03/21 1027 07/03/21 1028 07/03/21 1029 07/03/21 1130  BP:       (!) 114/49  Pulse: (!) 32 (!) 32 (!) 36 91  Resp: 14 17 (!) 23 (!) 22  Temp:          TempSrc:          SpO2: 97% 95% 95% 90%    No intake or output data in the 24 hours ending 07/03/21 1300 Last 3 Weights 07/02/2021 04/02/2021 01/15/2021  Weight (lbs) 272 lb 269 lb 12.8 oz 267 lb 6 oz  Weight (kg) 123.378 kg 122.38 kg 121.281 kg     There is no height or weight on file to calculate BMI.  General:  Well nourished, well developed, in no acute distress HEENT: normal Lymph: no adenopathy Neck: no JVD Endocrine:  No thryomegaly Vascular: No carotid bruits; FA pulses 2+ bilaterally without bruits Cardiac:  irregular and bradycardic; no murmurs, gallops or rubs Lungs:  diminished at  the bases, soft crackles, no wheezing, rhonchi or rales Abd: soft, nontender Ext: 1-2+ edema b/l LE Musculoskeletal:  No deformities Skin: warm and dry Neuro:  no  gross focal abnormalities noted Psych:  Normal affect   EKG:  The EKG was personally reviewed and demonstrates:     07/02/21 SR , Mobitz II, rate 62bpm, RBBB, LAD 725/22 CHB, V rate 45 RBBB, LAD   OLD 05/19/2017 SR 88bpm, RBBB, LAD   Telemetry:  Telemetry was personally reviewed and demonstrates:   CHB 30's-40's   Relevant CV Studies:    07/03/21: TTE IMPRESSIONS   1. Left ventricular ejection fraction, by estimation, is 60 to 65%. The  left ventricle has normal function. The left ventricle has no regional  wall motion abnormalities. There is mild concentric left ventricular  hypertrophy. Left ventricular diastolic  parameters are consistent with Grade I diastolic dysfunction (impaired  relaxation).   2. Right ventricular systolic  function is normal. The right ventricular  size is normal. There is mildly elevated pulmonary artery systolic  pressure.   3. The mitral valve is normal in structure. Mild mitral valve  regurgitation. No evidence of mitral stenosis.   4. The aortic valve is tricuspid. Aortic valve regurgitation is not  visualized. No aortic stenosis is present.   5. The inferior vena cava is dilated in size with <50% respiratory  variability, suggesting right atrial pressure of 15 mmHg.     Echocardiogram: 11/26/2015: LVEF 55 to 60%, no regional wall motion abnormalities, grade 1 diastolic impairment, elevated LVEDP, mildly dilated right atrium, moderate TR, PASP 48 mmHg.     Laboratory Data:   High Sensitivity Troponin:   Last Labs       Recent Labs  Lab 07/02/21 2013 07/02/21 2104  TROPONINIHS 14 12       Chemistry Last Labs        Recent Labs  Lab 07/02/21 2013 07/02/21 2105 07/03/21 0438  NA 136 137 136  K 3.8 3.8 3.9  CL 107 105 106  CO2 22  -- 20*  GLUCOSE 91 88 88  BUN '14 15 15  '$ CREATININE 1.06 1.00 0.99  CALCIUM 8.6*  -- 8.4*  GFRNONAA >60  -- >60  ANIONGAP 7  -- 10      Last Labs      Recent Labs  Lab 07/02/21 2013  PROT 6.2*  ALBUMIN 3.4*  AST 24  ALT 39  ALKPHOS 88  BILITOT 0.6      Hematology Last Labs        Recent Labs  Lab 07/02/21 2013 07/02/21 2105 07/03/21 0438  WBC 18.3*  -- 15.5*  RBC 4.73  -- 4.32  HGB 13.5 13.3 12.3*  HCT 43.4 39.0 39.5  MCV 91.8  -- 91.4  MCH 28.5  -- 28.5  MCHC 31.1  -- 31.1  RDW 14.1  -- 14.2  PLT 226  -- 210      BNP Last Labs      Recent Labs  Lab 07/02/21 2013  BNP 457.0*      DDimer  Last Labs   No results for input(s): DDIMER in the last 168 hours.       Radiology/Studies:  DG Chest Portable 1 View   Result Date: 07/02/2021 CLINICAL DATA:  65 year old male with chest pain. EXAM: PORTABLE CHEST 1 VIEW COMPARISON:  Chest radiograph dated 12/06/2015 and CT dated 11/28/2016. FINDINGS: No focal  consolidation, pleural effusion, pneumothorax. There is mild cardiomegaly with mild central vascular congestion.  No acute osseous pathology. Lower cervical ACDF. IMPRESSION: Mild cardiomegaly with mild central vascular congestion. No focal consolidation. Electronically Signed   By: Anner Crete M.D.   On: 07/02/2021 19:53     Assessment and Plan:    Advanced heart block including CHB Baseline RBBB, LAD known for him On amlodipine not felt to contribute, no other potential nodal blocking agents   Will await his cath findings BP stable No rest symptoms of bradycardia Keep him NPO for possible (likely pacer) tomorrow   Discussed CHB diagnosis and rational for pacer if cath is clean, discussed the procedure as well. Will follow up post cath Dr. Rayann Heman will see later today   Continue management with Dr. Terri Skains and attending as well.     Risk Assessment/Risk Scores:      For questions or updates, please contact [place] Please consult [email or web address] for contact info under      Signed, Baldwin Jamaica, PA-C  07/03/2021 1:00 PM     I have seen, examined the patient, and reviewed the above assessment and plan.  Changes to above are made where necessary.  On exam, RRR.  The patient presents with complete heart block.  He has chronic conduction system disease.  He is planned for cath later today to evaluate for ischemia as the cause. If cath is unrevealing for reversible/ ischemic cause, we will plan PPM tomorrow.    Risks, benefits, alternatives to pacemaker implantation were discussed in detail with the patient and his wife today. The patient understands that the risks include but are not limited to bleeding, infection, pneumothorax, perforation, tamponade, vascular damage, renal failure, MI, stroke, death,  and lead dislodgement and wishes to proceed.     Carleene Overlie Lamont Tant,MD

## 2021-07-04 NOTE — Progress Notes (Signed)
OT Cancellation Note  Patient Details Name: George Leon MRN: [id number] DOB: [birth year]   Cancelled Treatment:    Reason Eval/Treat Not Completed: Patient at procedure or test/ unavailable Pt leaving unit for pacemaker placement this AM. Will follow-up s/p procedure for OT eval.   Layla Maw 07/04/2021, 8:03 AM

## 2021-07-04 NOTE — Progress Notes (Addendum)
FPTS Brief Progress Note  S Saw patient at bedside this evening. Patient reports pain at pacemaker site and norco is not helping much as he already takes this at home for chronic pain.   O: BP (!) 143/78 (BP Location: Left Arm)   Pulse 79   Temp 97.7 F (36.5 C) (Oral)   Resp 19   Ht '5\' 8"'$  (1.727 m)   Wt 119.6 kg   SpO2 94%   BMI 40.08 kg/m    General: Alert, no acute distress, pleasant  Pacemaker site and dressings-clean and dry   A/P: Plan per day team  -for pain: one time dose  2.'5mg'$  oxycodone, tyelnol '650mg'$  Q4PRN, norco Q6PRN -narcan PRN  -Possible d/c tomorrow - Orders reviewed. Labs for AM ordered, which was adjusted as needed.   Lattie Haw, MD 07/04/2021, 9:27 PM PGY-3, [facility] Night Resident  Please page [phone] with questions.

## 2021-07-04 NOTE — Telephone Encounter (Signed)
Yes sir.  

## 2021-07-04 NOTE — Discharge Instructions (Addendum)
Dear George Leon,  Thank you for letting us participate in your care.   POST-HOSPITAL & CARE INSTRUCTIONS You will take one lasix pill every day. We have sent this prescription to your [facility] pharmacy on [address].  You will need to follow up with your primary care clinic and cardiologist. The details are below.    DOCTOR'S APPOINTMENT   Future Appointments  Date Time Provider [place]  07/09/2021 11:00 AM Alen Bleacher, MD [facility]  07/18/2021 10:00 AM [facility]  10/09/2021 11:30 AM Evans Lance, MD [facility]    Follow-up Information     [facility] Office Follow up.   Specialty: Cardiology Why: 07/18/21 @ 10:00AM, wound check visit Contact information: [address] [place]        Evans Lance, MD Follow up.   Specialty: Cardiology Why: 10/09/21 @ 11:30AM Contact information: [id number] [place] [address] [phone]         Alen Bleacher, MD. Daphane Shepherd on 07/09/2021.   Specialty: Student Why: At 11am. Please arrive by 10:45am. This is your hospital follow up appointment with your new primary care doctor. If this day and time does not work for you, please call the office directly to reschedule. Contact information: [place] [address] [phone]         Rex Kras, DO. Go in 2 week(s).   Specialties: Cardiology, Vascular Surgery Contact information: [address] [place] [address] [phone]                 Take care and be well!  [facility]  [place],  [address] [phone]          Supplemental Discharge Instructions for  Pacemaker/Defibrillator Patients   Activity No heavy lifting or vigorous activity with your  left/right arm for 6 to 8 weeks.  Do not raise your left/right arm above your head for one week.  Gradually raise your affected arm as drawn below.             07/09/21                      07/10/21                     07/11/21                   07/12/21 __  NO DRIVING for  1 week   ; you may begin driving on   [id number]  .  WOUND CARE Keep the wound area clean and dry.  Do not get this area wet , no showers for one week; you may shower on     . The tape/steri-strips on your wound will fall off; do not pull them off.  No bandage is needed on the site.  DO  NOT apply any creams, oils, or ointments to the wound area. If you notice any drainage or discharge from the wound, any swelling or bruising at the site, or you develop a fever > 101? F after you are discharged home, call the office at once.  Special Instructions You are still able to use cellular telephones; use the ear opposite the side where you have your pacemaker/defibrillator.  Avoid carrying your cellular  phone near your device. When traveling through airports, show security personnel your identification card to avoid being screened in the metal detectors.  Ask the security personnel to use the hand wand. Avoid arc welding equipment, MRI testing (magnetic resonance imaging), TENS units (transcutaneous nerve stimulators).  Call the office for questions about other devices. Avoid electrical appliances that are in poor condition or are not properly grounded. Microwave ovens are safe to be near or to operate.

## 2021-07-04 NOTE — Consult Note (Signed)
   [facility] Inpatient Consult   07/04/2021  DEROLD RAMIREZGARCIA 25-Aug-1956 [id number]  [place] [facility] [ACO] Patient: Sherre Poot [facility] [employer]  Primary Care Provider:  Alen Bleacher, MD, [place], Embedded CCM  Patient screened for hospitalization and discussed in HF unit progression meeting. Review of patient's medical record reveals patient is in an Embedded provider practice with [facility].  This provider has a chronic care management [CCM] program that could be accessible to the patient if he qualifies.  Plan:  Continue to follow progress and disposition to assess for post hospital care management needs.    For questions contact:   Natividad Brood, RN BSN [facility] Liaison  [phone] business mobile phone Toll free office [phone]  Fax number: [phone] [email or web address]

## 2021-07-05 ENCOUNTER — Inpatient Hospital Stay (HOSPITAL_COMMUNITY): Payer: BC Managed Care – PPO

## 2021-07-05 ENCOUNTER — Other Ambulatory Visit: Payer: Self-pay | Admitting: Family Medicine

## 2021-07-05 ENCOUNTER — Telehealth: Payer: Self-pay

## 2021-07-05 ENCOUNTER — Encounter (HOSPITAL_COMMUNITY): Payer: Self-pay | Admitting: Internal Medicine

## 2021-07-05 LAB — BASIC METABOLIC PANEL
Anion gap: 7 (ref 5–15)
BUN: 7 mg/dL — ABNORMAL LOW (ref 8–23)
CO2: 26 mmol/L (ref 22–32)
Calcium: 8.6 mg/dL — ABNORMAL LOW (ref 8.9–10.3)
Chloride: 103 mmol/L (ref 98–111)
Creatinine, Ser: 0.71 mg/dL (ref 0.61–1.24)
GFR, Estimated: >60 mL/min (ref >60)
Glucose, Bld: 108 mg/dL — ABNORMAL HIGH (ref 70–99)
Potassium: 3.6 mmol/L (ref 3.5–5.1)
Sodium: 136 mmol/L (ref 135–145)

## 2021-07-05 LAB — CBC
HCT: 39.1 % (ref 39.0–52.0)
Hemoglobin: 12.7 g/dL — ABNORMAL LOW (ref 13.0–17.0)
MCH: 28.5 pg (ref 26.0–34.0)
MCHC: 32.5 g/dL (ref 30.0–36.0)
MCV: 87.9 fL (ref 80.0–100.0)
Platelets: 197 10*3/uL (ref 150–400)
RBC: 4.45 MIL/uL (ref 4.22–5.81)
RDW: 14.1 % (ref 11.5–15.5)
WBC: 12.5 10*3/uL — ABNORMAL HIGH (ref 4.0–10.5)
nRBC: 0 % (ref 0.0–0.2)

## 2021-07-05 MED ORDER — ACETAMINOPHEN 325 MG PO TABS: 650.0000 mg | ORAL_TABLET | ORAL | Status: DC | PRN

## 2021-07-05 MED ORDER — FUROSEMIDE 40 MG PO TABS: 40.0000 mg | ORAL_TABLET | Freq: Every day | ORAL | 0 refills | Status: DC

## 2021-07-05 NOTE — Progress Notes (Signed)
CXR reveals stable leads, no ptx. EP APP to do device education and arrange follow-up.  Ok to discharge from EP standpoint.  Thompson Grayer MD, [place] 07/05/2021 7:35 AM

## 2021-07-05 NOTE — Hospital Course (Addendum)
George Leon is a 65 year old male presents with symptomatic bradycardia with episodes of dizziness.  Past medical history is significant for HTN, CAD, peripheral vascular disease, diastolic heart failure, chronic bronchitis, COPD, and GERD  Symptomatic bradycardia Patient presented to the ED due to persistent symptomatic bradycardia with episodes of dizziness.  He said his apple watch shows his heart rate to be in the 30s and 40s.  He was sent to ED by his cardiologist at his outpatient appointment which showed him to have a second-degree type II AV block, RBBB and left anterior fascicular block in the ED repeat EKG showed third-degree block with ventricular rate of 40 bpm.  Other labs showed BNP of 457, troponin TSH and magnesium WNL. CXR showed mild central vascular congestion.  Cardiology was consulted who performed a left cardiac catheterization which showed no signs of vascular block.  Following the catheterization cardiology performed a pacemaker placement procedure. Discharge with scheduled EP follow up.   Mild CHF exacerbation Patient on arrival in the ED complaint of persistent dyspnea and lower extremity edema for over 2 to 3 weeks.  On physical exam patient had bilateral crackles of and +2 LE edema.  Patient's management include strict I's/O's, and p.o Lasix 40 mg daily. Discharged with scheduled cardiology follow up.     Discharge recommendations:  May consider addition of carvedilol 6.25 mg p.o. twice daily per cards Cardiology to follow up in 2 weeks Per cards, he would benefit from transitioning from losartan to [facility] and initiation of Jardiance - cards will initiate as outpatient.

## 2021-07-05 NOTE — Progress Notes (Signed)
Progress Note  Patient Name: George Leon Date of Encounter: 07/05/2021  Attending physician: Dickie La, MD Primary care provider: Alen Bleacher, MD Consultant:Luisantonio Adinolfi Terri Skains, DO  Subjective: George Leon is a 65 y.o. male who was seen and examined at bedside His son and PT present at bedside No events overnight. Feels less short of breath. Denies angina pectoris, orthopnea, paroxysmal nocturnal dyspnea and lower extremity swelling.  Objective: Vital Signs in the last 24 hours: Temp:  [97.6 F (36.4 C)-97.9 F (36.6 C)] 97.6 F (36.4 C) (07/28 0502) Pulse Rate:  [0-155] 67 (07/28 0502) Resp:  [0-28] 19 (07/27 1925) BP: (120-168)/(71-105) 158/79 (07/28 0502) SpO2:  [0 %-100 %] 96 % (07/28 0502) Weight:  [119.8 kg] 119.8 kg (07/28 0023)  Intake/Output:  Intake/Output Summary (Last 24 hours) at 07/05/2021 0855 Last data filed at 07/05/2021 0503 Gross per 24 hour  Intake 392.95 ml  Output 800 ml  Net -407.05 ml    Net IO Since Admission: -407.05 mL [07/05/21 0855]  Weights:  Filed Weights   07/03/21 1703 07/04/21 0326 07/05/21 0023  Weight: 119.7 kg 119.6 kg 119.8 kg    Telemetry: Personally reviewed.  Paced rhythm.  Physical examination: PHYSICAL EXAM: Vitals with BMI 07/05/2021 07/05/2021 07/04/2021  Height - - -  Weight - 264 lbs 2 oz -  BMI - [id number] -  Systolic 0000000 - [id number]  Diastolic 79 - 78  Pulse 67 - 79    CONSTITUTIONAL: Well-developed and well-nourished. No acute distress.  SKIN: Skin is warm and dry. No rash noted. No cyanosis. No pallor. No jaundice HEAD: Normocephalic and atraumatic.  EYES: No scleral icterus MOUTH/THROAT: Moist oral membranes.  NECK: No JVD present. No thyromegaly noted. No carotid bruits  LYMPHATIC: No visible cervical adenopathy.  CHEST Normal respiratory effort. No intercostal retractions.  Pacemaker site left infraclavicular region Steri-Strips present, clean dry and intact LUNGS: Clear to auscultation bilaterally.  No  stridor. No wheezes. No rales.  CARDIOVASCULAR: Regular, positive S1-S2, no murmurs rubs or gallops appreciated. ABDOMINAL: Obese, soft, nontender, nondistended, positive bowel sounds in all 4 quadrants no apparent ascites.  EXTREMITIES: Trace bilateral peripheral edema, faint DP and PT pulses bilaterally. HEMATOLOGIC: No significant bruising NEUROLOGIC: Oriented to person, place, and time. Nonfocal. Normal muscle tone.  PSYCHIATRIC: Normal mood and affect. Normal behavior. Cooperative  Lab Results: Hematology Recent Labs  Lab 07/03/21 0438 07/04/21 0222 07/05/21 0326  WBC 15.5* 14.6* 12.5*  RBC 4.32 4.44 4.45  HGB 12.3* 12.7* 12.7*  HCT 39.5 39.5 39.1  MCV 91.4 89.0 87.9  MCH 28.5 28.6 28.5  MCHC 31.1 32.2 32.5  RDW 14.2 14.1 14.1  PLT 210 229 197    Chemistry Recent Labs  Lab 07/02/21 2013 07/02/21 2105 07/03/21 0438 07/04/21 0222 07/05/21 0326  NA 136   < > 136 137 136  K 3.8   < > 3.9 3.6 3.6  CL 107   < > 106 105 103  CO2 22  --  20* 23 26  GLUCOSE 91   < > 88 106* 108*  BUN 14   < > 15 13 7*  CREATININE 1.06   < > 0.99 0.78 0.71  CALCIUM 8.6*  --  8.4* 8.7* 8.6*  PROT 6.2*  --   --  5.9*  --   ALBUMIN 3.4*  --   --  3.2*  --   AST 24  --   --  27  --   ALT 39  --   --  32  --   ALKPHOS 88  --   --  79  --   BILITOT 0.6  --   --  0.7  --   GFRNONAA >60  --  >60 >60 >60  ANIONGAP 7  --  '10 9 7   '$ < > = values in this interval not displayed.     Cardiac Enzymes: Cardiac Panel (last 3 results) Recent Labs    07/02/21 2013 07/02/21 2104  TROPONINIHS 14 12    BNP (last 3 results) Recent Labs    07/02/21 2013  BNP 457.0*    ProBNP (last 3 results) No results for input(s): PROBNP in the last 8760 hours.   DDimer No results for input(s): DDIMER in the last 168 hours.   Hemoglobin A1c:  Lab Results  Component Value Date   HGBA1C 6.1 08/04/2017    TSH  Recent Labs    07/02/21 2013  TSH 1.306    Lipid Panel  Lab Results  Component  Value Date   CHOL 146 04/02/2021   HDL 35 (L) 04/02/2021   LDLCALC 86 04/02/2021   LDLDIRECT 85 07/22/2012   TRIG 140 04/02/2021   CHOLHDL 4.2 04/02/2021    Imaging: DG Chest 2 View  Result Date: 07/05/2021 CLINICAL DATA:  Status post pacemaker insertion. Shortness of breath. EXAM: CHEST - 2 VIEW COMPARISON:  07/02/2021. FINDINGS: Cardiac pacer with lead tip over the right atrium right ventricle. Heart size stable. Mild bilateral interstitial prominence. Mild interstitial edema cannot be excluded. No pleural effusion or pneumothorax. Prior cervical spine fusion. IMPRESSION: Cardiac pacer with lead tips over the right atrium and right ventricle. No pneumothorax. Heart size stable. Mild bilateral interstitial prominence. Mild interstitial edema cannot be excluded. Electronically Signed   By: Marcello Moores  Register   On: 07/05/2021 07:40   CARDIAC CATHETERIZATION  Result Date: 07/03/2021 Left Heart Catheterization 07/03/21: No significant CAD.  Moderately elevated EDP. LV: 125 mmHg / 12 mmHg, EDP 22 mmHg.  Aortic pressure 109/46 with a mean of 69 mmHg.  There was no pressure gradient across the aortic valve.  LVEDP moderately elevated.  LVEF 65%, no significant MR. LM: Large vessel, normal. LAD: Very large vessel, gives origin to 3 large diagonals, very mild disease is evident with mild calcification in the proximal segment. CX: Moderate sized vessel, smooth and normal. RCA: Very large vessel, PL branch is very large.  There is mild disease in the distal RCA.  Mild calcification is evident in the proximal segment. Recommendation: No reversible cause for heart block.  He will be scheduled for pacemaker implantation tomorrow.   EP PPM/ICD IMPLANT  Result Date: 07/04/2021 CONCLUSIONS:  1. Successful implantation of a Biotronik dual-chamber pacemaker for symptomatic bradycardia due to complete heart block  2. No early apparent complications.       Cristopher Peru, MD 07/04/2021 9:47 AM    ECHOCARDIOGRAM  COMPLETE  Result Date: 07/03/2021    ECHOCARDIOGRAM REPORT   Patient Name:   George Leon [facility] Date of Exam: 07/03/2021 Medical Rec #:  [id number]        Height:       68.0 in Accession #:    [id number]       Weight:       272.0 lb Date of Birth:  04/30/1956        BSA:          2.329 m Patient Age:    50 years         BP:  114/49 mmHg Patient Gender: M                HR:           30 bpm. Exam Location:  Inpatient Procedure: 2D Echo, Cardiac Doppler, Color Doppler and 3D Echo Indications:    I50.40* Unspecified combined systolic (congestive) and diastolic                 (congestive) heart failure  History:        Patient has prior history of Echocardiogram examinations. CHF,                 Previous Myocardial Infarction, Abnormal ECG, COPD,                 Arrythmias:AV block, Signs/Symptoms:Shortness of Breath and                 Dyspnea; Risk Factors:Current Smoker and Dyslipidemia.  Sonographer:    Roseanna Rainbow RDCS Referring Phys: [id number] CARINA M BROWN  Sonographer Comments: Patient is morbidly obese. Patient supine. Patent has back pain, could not turn. Could not isolate mitral regurgitation doppler signal despite many attempts. IMPRESSIONS  1. Left ventricular ejection fraction, by estimation, is 60 to 65%. The left ventricle has normal function. The left ventricle has no regional wall motion abnormalities. There is mild concentric left ventricular hypertrophy. Left ventricular diastolic parameters are consistent with Grade I diastolic dysfunction (impaired relaxation).  2. Right ventricular systolic function is normal. The right ventricular size is normal. There is mildly elevated pulmonary artery systolic pressure.  3. The mitral valve is normal in structure. Mild mitral valve regurgitation. No evidence of mitral stenosis.  4. The aortic valve is tricuspid. Aortic valve regurgitation is not visualized. No aortic stenosis is present.  5. The inferior vena cava is dilated in size with <50% respiratory  variability, suggesting right atrial pressure of 15 mmHg. FINDINGS  Left Ventricle: Left ventricular ejection fraction, by estimation, is 60 to 65%. The left ventricle has normal function. The left ventricle has no regional wall motion abnormalities. The left ventricular internal cavity size was normal in size. There is  mild concentric left ventricular hypertrophy. Left ventricular diastolic parameters are consistent with Grade I diastolic dysfunction (impaired relaxation). Right Ventricle: The right ventricular size is normal. No increase in right ventricular wall thickness. Right ventricular systolic function is normal. There is mildly elevated pulmonary artery systolic pressure. The tricuspid regurgitant velocity is 2.67  m/s, and with an assumed right atrial pressure of 15 mmHg, the estimated right ventricular systolic pressure is [id number]. Left Atrium: Left atrial size was normal in size. Right Atrium: Right atrial size was normal in size. Pericardium: There is no evidence of pericardial effusion. Mitral Valve: The mitral valve is normal in structure. Mild mitral valve regurgitation. No evidence of mitral valve stenosis. Tricuspid Valve: The tricuspid valve is normal in structure. Tricuspid valve regurgitation is trivial. No evidence of tricuspid stenosis. Aortic Valve: The aortic valve is tricuspid. Aortic valve regurgitation is not visualized. No aortic stenosis is present. Pulmonic Valve: The pulmonic valve was normal in structure. Pulmonic valve regurgitation is not visualized. No evidence of pulmonic stenosis. Aorta: The aortic root is normal in size and structure. Venous: The inferior vena cava is dilated in size with less than 50% respiratory variability, suggesting right atrial pressure of 15 mmHg. IAS/Shunts: No atrial level shunt detected by color flow Doppler. Additional Comments: Profound bradycardia with complete heart block throughout the study.  LEFT VENTRICLE PLAX 2D LVIDd:         5.50 cm  LVIDs:         3.70 cm LV PW:         1.10 cm LV IVS:        1.10 cm LVOT diam:     2.30 cm LV SV:         156 LV SV Index:   67 LVOT Area:     4.15 cm  LV Volumes (MOD) LV vol d, MOD A2C: 111.0 ml LV vol d, MOD A4C: 111.0 ml LV vol s, MOD A2C: 39.3 ml LV vol s, MOD A4C: 42.4 ml LV SV MOD A2C:     71.7 ml LV SV MOD A4C:     111.0 ml LV SV MOD BP:      74.2 ml RIGHT VENTRICLE             IVC RV S prime:     12.80 cm/s  IVC diam: 2.50 cm TAPSE (M-mode): 1.6 cm LEFT ATRIUM             Index       RIGHT ATRIUM           Index LA diam:        4.00 cm 1.72 cm/m  RA Area:     18.00 cm LA Vol (A2C):   30.6 ml 13.14 ml/m RA Volume:   56.40 ml  24.22 ml/m LA Vol (A4C):   36.4 ml 15.63 ml/m LA Biplane Vol: 35.8 ml 15.37 ml/m  AORTIC VALVE LVOT Vmax:   152.50 cm/s LVOT Vmean:  81.650 cm/s LVOT VTI:    0.376 m  AORTA Ao Root diam: 3.20 cm MITRAL VALVE                TRICUSPID VALVE MV Area (PHT): 3.19 cm     TR Peak grad:   28.5 mmHg MV Decel Time: 238 msec     TR Vmax:        267.00 cm/s MR PISA:        0.57 cm MR PISA Radius: 0.30 cm     SHUNTS MV E velocity: 118.00 cm/s  Systemic VTI:  0.38 m MV A velocity: 128.00 cm/s  Systemic Diam: 2.30 cm MV E/A ratio:  0.92 Skeet Latch MD Electronically signed by Skeet Latch MD Signature Date/Time: 07/03/2021/12:38:00 PM    Final     Cardiac database: EKG: 07/05/2021: Atrial sensed ventricular paced rhythm.  Echocardiogram: 07/03/2021: LVEF 60-65%, no regional wall motion abnormalities, mild LVH, grade 1 diastolic impairment, normal right ventricular size and function, mild MR, right atrial pressure 15 mmHg.  Stress test: None  Heart catheterization: Left Heart Catheterization 07/03/21: No significant CAD.  Moderately elevated EDP. LV: 125 mmHg / 12 mmHg, EDP 22 mmHg.  Aortic pressure 109/46 with a mean of 69 mmHg.  There was no pressure gradient across the aortic valve.  LVEDP moderately elevated.  LVEF 65%, no significant MR. LM: Large vessel,  normal. LAD: Very large vessel, gives origin to 3 large diagonals, very mild disease is evident with mild calcification in the proximal segment. CX: Moderate sized vessel, smooth and normal. RCA: Very large vessel, PL branch is very large.  There is mild disease in the distal RCA.  Mild calcification is evident in the proximal segment. Recommendation: No reversible cause for heart block.  He will be scheduled for pacemaker implantation tomorrow.  Dual-chamber pacemaker implantation: 07/04/2021:  1. 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  Scheduled Meds:  atorvastatin  80 mg Oral Daily   DULoxetine  60 mg Oral Daily   ezetimibe  10 mg Oral Daily   furosemide  40 mg Oral Daily   losartan  100 mg Oral QHS   nicotine  21 mg Transdermal Daily   pantoprazole  40 mg Oral Daily   sodium chloride flush  3 mL Intravenous Q12H   sodium chloride flush  3 mL Intravenous Q12H   sodium chloride flush  3 mL Intravenous Q12H   umeclidinium bromide  1 puff Inhalation Daily    Continuous Infusions:  sodium chloride Stopped (07/04/21 1820)    PRN Meds: sodium chloride, acetaminophen, albuterol, HYDROcodone-acetaminophen, naloxone, ondansetron (ZOFRAN) IV, sodium chloride, sodium chloride flush   IMPRESSION & RECOMMENDATIONS: George Leon is a 65 y.o. male whose past medical history and cardiac risk factors include: Status post dual-chamber pacemaker, HFpEF, hypertension, hyperlipidemia, tobacco abuse, COPD, obesity due to excess calories, questionable history of PVD.  Status post dual-chamber pacemaker implantation due to symptomatic bradycardia: Patient has known baseline conduction disease with right bundle branch and left anterior fascicular block.  He has ECG evidence of second-degree type II AV block including complete and given his shortness of breath with very minimal exertional activity he was referred to the  hospital for further evaluation and management. Underwent dual-chamber pacemaker implantation on 07/04/2021 Patient has been cleared to be discharged from EP standpoint.  Acute heart failure with preserved EF: BNP elevated, right atrial pressure is elevated on echocardiogram, and elevated LVEDP. Currently on Lasix, losartan. Will uptitrate GDMT on outpatient basis as guided by his labs, hemodynamics, and symptoms He would benefit from transitioning from losartan to [place] and initiation of Jardiance -we will initiate as outpatient. Strict I's and O's and daily weights, educated on the importance of a low-salt diet.  Hyperlipidemia: Continue medical management.  Benign essential hypertension: Management per primary team.  May consider addition of carvedilol 6.25 mg p.o. twice daily.  Will arrange an outpatient follow-up post discharge from the hospital in 2 weeks.  Patient's questions and concerns were addressed to his satisfaction. He voices understanding of the instructions provided during this encounter.   This note was created using a voice recognition software as a result there may be grammatical errors inadvertently enclosed that do not reflect the nature of this encounter. Every attempt is made to correct such errors.  Rex Kras, DO, [facility] Office: [phone] 07/05/2021, 8:55 AM

## 2021-07-05 NOTE — Progress Notes (Signed)
Progress Note  Patient Name: George Leon Date of Encounter: 07/05/2021  [facility] Cardiologist: Dr. Terri Skains  Subjective   Aching discomfort at pacer site, no CP or SOB, wants to go home  Inpatient Medications    Scheduled Meds:  atorvastatin  80 mg Oral Daily   DULoxetine  60 mg Oral Daily   ezetimibe  10 mg Oral Daily   furosemide  40 mg Oral Daily   losartan  100 mg Oral QHS   nicotine  21 mg Transdermal Daily   pantoprazole  40 mg Oral Daily   sodium chloride flush  3 mL Intravenous Q12H   sodium chloride flush  3 mL Intravenous Q12H   sodium chloride flush  3 mL Intravenous Q12H   umeclidinium bromide  1 puff Inhalation Daily   Continuous Infusions:  sodium chloride Stopped (07/04/21 1820)   PRN Meds: sodium chloride, acetaminophen, albuterol, HYDROcodone-acetaminophen, naloxone, ondansetron (ZOFRAN) IV, sodium chloride, sodium chloride flush   Vital Signs    Vitals:   07/04/21 1507 07/04/21 1925 07/05/21 0023 07/05/21 0502  BP: 120/71 (!) 143/78  (!) 158/79  Pulse:  79  67  Resp:  19    Temp:  97.7 F (36.5 C)  97.6 F (36.4 C)  TempSrc:  Oral  Oral  SpO2:  94%  96%  Weight:   119.8 kg   Height:        Intake/Output Summary (Last 24 hours) at 07/05/2021 0839 Last data filed at 07/05/2021 0503 Gross per 24 hour  Intake 392.95 ml  Output 800 ml  Net -407.05 ml   Last 3 Weights 07/05/2021 07/04/2021 07/03/2021  Weight (lbs) 264 lb 1.6 oz 263 lb 9.6 oz 263 lb 14.3 oz  Weight (kg) 119.795 kg 119.568 kg 119.7 kg      Telemetry    SR/ V paced , generally 70s - Personally Reviewed  ECG    SR/ V paced - Personally Reviewed  Physical Exam   GEN: No acute distress.   Neck: No JVD Cardiac: RRR, no murmurs, rubs, or gallops.  Respiratory: CTA b/l. GI: Soft, nontender, non-distended  MS: No edema; No deformity. Neuro:  Nonfocal  Psych: Normal affect   L chest/pacer site: no hematoma or ecchymosis, drainage, steri strips in place  Labs     High Sensitivity Troponin:   Recent Labs  Lab 07/02/21 2013 07/02/21 2104  TROPONINIHS 14 12      Chemistry Recent Labs  Lab 07/02/21 2013 07/02/21 2105 07/03/21 0438 07/04/21 0222 07/05/21 0326  NA 136   < > 136 137 136  K 3.8   < > 3.9 3.6 3.6  CL 107   < > 106 105 103  CO2 22  --  20* 23 26  GLUCOSE 91   < > 88 106* 108*  BUN 14   < > 15 13 7*  CREATININE 1.06   < > 0.99 0.78 0.71  CALCIUM 8.6*  --  8.4* 8.7* 8.6*  PROT 6.2*  --   --  5.9*  --   ALBUMIN 3.4*  --   --  3.2*  --   AST 24  --   --  27  --   ALT 39  --   --  32  --   ALKPHOS 88  --   --  79  --   BILITOT 0.6  --   --  0.7  --   GFRNONAA >60  --  >60 >60 >60  ANIONGAP 7  --  '10 9 7   '$ < > = values in this interval not displayed.     Hematology Recent Labs  Lab 07/03/21 0438 07/04/21 0222 07/05/21 0326  WBC 15.5* 14.6* 12.5*  RBC 4.32 4.44 4.45  HGB 12.3* 12.7* 12.7*  HCT 39.5 39.5 39.1  MCV 91.4 89.0 87.9  MCH 28.5 28.6 28.5  MCHC 31.1 32.2 32.5  RDW 14.2 14.1 14.1  PLT 210 229 197    BNP Recent Labs  Lab 07/02/21 2013  BNP 457.0*     DDimer No results for input(s): DDIMER in the last 168 hours.   Radiology    DG Chest 2 View Result Date: 07/05/2021 CLINICAL DATA:  Status post pacemaker insertion. Shortness of breath. EXAM: CHEST - 2 VIEW COMPARISON:  07/02/2021. FINDINGS: Cardiac pacer with lead tip over the right atrium right ventricle. Heart size stable. Mild bilateral interstitial prominence. Mild interstitial edema cannot be excluded. No pleural effusion or pneumothorax. Prior cervical spine fusion. IMPRESSION: Cardiac pacer with lead tips over the right atrium and right ventricle. No pneumothorax. Heart size stable. Mild bilateral interstitial prominence. Mild interstitial edema cannot be excluded. Electronically Signed   By: Marcello Moores  Register   On: 07/05/2021 07:40   CARDIAC CATHETERIZATION Result Date: 07/03/2021 Left Heart Catheterization 07/03/21: No significant CAD.   Moderately elevated EDP. LV: 125 mmHg / 12 mmHg, EDP 22 mmHg.  Aortic pressure 109/46 with a mean of 69 mmHg.  There was no pressure gradient across the aortic valve.  LVEDP moderately elevated.  LVEF 65%, no significant MR. LM: Large vessel, normal. LAD: Very large vessel, gives origin to 3 large diagonals, very mild disease is evident with mild calcification in the proximal segment. CX: Moderate sized vessel, smooth and normal. RCA: Very large vessel, PL branch is very large.  There is mild disease in the distal RCA.  Mild calcification is evident in the proximal segment. Recommendation: No reversible cause for heart block.  He will be scheduled for pacemaker implantation tomorrow.       Cardiac Studies    07/03/21: TTE IMPRESSIONS   1. Left ventricular ejection fraction, by estimation, is 60 to 65%. The  left ventricle has normal function. The left ventricle has no regional  wall motion abnormalities. There is mild concentric left ventricular  hypertrophy. Left ventricular diastolic  parameters are consistent with Grade I diastolic dysfunction (impaired  relaxation).   2. Right ventricular systolic function is normal. The right ventricular  size is normal. There is mildly elevated pulmonary artery systolic  pressure.   3. The mitral valve is normal in structure. Mild mitral valve  regurgitation. No evidence of mitral stenosis.   4. The aortic valve is tricuspid. Aortic valve regurgitation is not  visualized. No aortic stenosis is present.   5. The inferior vena cava is dilated in size with <50% respiratory  variability, suggesting right atrial pressure of 15 mmHg.     Echocardiogram: 11/26/2015: LVEF 55 to 60%, no regional wall motion abnormalities, grade 1 diastolic impairment, elevated LVEDP, mildly dilated right atrium, moderate TR, PASP 48 mmHg.    Patient Profile     65 y.o. male  HTN, HLD unclear hx of PVD, obesity, COPD, smoker admitted with progressive weakness, SOB, CP,  found in CHB  Assessment & Plan    Advanced heart block, including CHB No reversible causes Cath without obstructive CAD Underwent PPM yesterday with dr. Lovena Le Device check last evening with intact function CXR this AM without  ptx Site is stable  Dr. Rayann Heman has seen and examined the patient this AM I have discussed wound care and activity restrictions with the patient and his son at bedside Post pacer follow up is in place  Pt inquires about pain medicine, he has and uses chronically Norco, discussed that we do not typically rx narcotics for post pacer management and that no alternative or additional pain medicine would be prescribed.  OK to discharge from EP perspective   For questions or updates, please contact [place] Please consult [email or web address] for contact info under        Signed, Baldwin [place], PA-C  07/05/2021, 8:39 AM

## 2021-07-05 NOTE — Evaluation (Signed)
Occupational Therapy Evaluation Patient Details Name: George Leon MRN: [id number] DOB: [birth year] Today's Date: 07/05/2021    History of Present Illness 65 y.o. male [facility] ED on 07/02/2021 with symptomatic bradycardia, found to have CHB. Pt underwent left heart cath on 07/03/2021. PPM placement on 07/04/2021. PMH includes HTN, GERD, chronic bronchitis, peripheral vascular disease, diastolic heart failure, CAD, pulmonary hypertension, HLD, COPD.   Clinical Impression   PTA patient independent with ADLs, using cane as needed. Admitted for above and limited by problem list below, including pain, activity tolerance and adherence to PPM precautions.  He currently requires supervision for transfers and mobility in room without AD, requires up to min assist for ADLs.  Educated on precautions and ADL compensatory techniques, will have good support from family as needed. Based on performance today, recommend continued OT services acutely to optimize independence and safety but anticipate no further needs after dc home.      Follow Up Recommendations  No OT follow up;Supervision - Intermittent    Equipment Recommendations  None recommended by OT    Recommendations for Other Services       Precautions / Restrictions Precautions Precautions: Fall;ICD/Pacemaker Restrictions Weight Bearing Restrictions: Yes LUE Weight Bearing: Non weight bearing      Mobility Bed Mobility Overal bed mobility: Needs Assistance Bed Mobility: Supine to Sit     Supine to sit: Supervision     General bed mobility comments: supervision for safety, cueing for L UE precautions    Transfers Overall transfer level: Needs assistance Equipment used: None Transfers: Sit to/from Stand Sit to Stand: Supervision              Balance Overall balance assessment: Needs assistance Sitting-balance support: No upper extremity supported;Feet supported Sitting balance-Leahy Scale: Good     Standing  balance support: No upper extremity supported;During functional activity Standing balance-Leahy Scale: Good                             ADL either performed or assessed with clinical judgement   ADL Overall ADL's : Needs assistance/impaired     Grooming: Supervision/safety;Standing   Upper Body Bathing: Minimal assistance;Sitting   Lower Body Bathing: Minimal assistance;Sit to/from stand   Upper Body Dressing : Minimal assistance;Sitting   Lower Body Dressing: Minimal assistance;Sit to/from stand   Toilet Transfer: Supervision/safety;Ambulation Toilet Transfer Details (indicate cue type and reason): simulated in room         Functional mobility during ADLs: Supervision/safety;Cueing for safety General ADL Comments: pt limited by decreased functional use of L UE due to PPM precautions, decreased activity tolerance     Vision         Perception     Praxis      Pertinent Vitals/Pain Pain Assessment: Faces Faces Pain Scale: Hurts little more Pain Location: PPM incision site Pain Descriptors / Indicators: Grimacing Pain Intervention(s): Monitored during session     Hand Dominance Right   Extremity/Trunk Assessment Upper Extremity Assessment Upper Extremity Assessment: LUE deficits/detail;RUE deficits/detail RUE Sensation: history of peripheral neuropathy LUE Deficits / Details: limited by PPM precautions, WFL elbow and distal LUE Sensation: decreased proprioception   Lower Extremity Assessment Lower Extremity Assessment: Defer to PT evaluation   Cervical / Trunk Assessment Cervical / Trunk Assessment: Normal   Communication Communication Communication: No difficulties   Cognition Arousal/Alertness: Awake/alert Behavior During Therapy: WFL for tasks assessed/performed Overall Cognitive Status: Within Functional Limits for tasks assessed  General Comments  VSS on RA, son present and  supportive    Exercises     Shoulder Instructions      Home Living Family/patient expects to be discharged to:: Private residence Living Arrangements: Spouse/significant other;Children Available Help at Discharge: Family;Available 24 hours/day Type of Home: House Home Access: Stairs to enter [employer] of Steps: 3 Entrance Stairs-Rails: None Home Layout: One level     Bathroom Shower/Tub: Teacher, early years/pre: Standard     Home Equipment: Environmental consultant - 2 wheels;Cane - single point;Wheelchair - manual;Shower seat          Prior Functioning/Environment Level of Independence: Needs assistance  Gait / Transfers Assistance Needed: pt reports independent ambulation, PRN use of cane. Pt does require assistance with stair negotiation ADL's / Homemaking Assistance Needed: pt is independent with ADLs, independent IADLs at baseline, not driving            OT Problem List: Decreased strength;Decreased activity tolerance;Decreased safety awareness;Decreased knowledge of precautions;Impaired UE functional use      OT Treatment/Interventions: Self-care/ADL training;Energy conservation;DME and/or AE instruction;Therapeutic activities;Patient/family education    OT Goals(Current goals can be found in the care plan section) Acute Rehab OT Goals Patient Stated Goal: to go home OT Goal Formulation: With patient Time For Goal Achievement: 07/19/21 Potential to Achieve Goals: Good  OT Frequency: Min 2X/week   Barriers to D/C:            Co-evaluation              AM-PAC OT "6 Clicks" Daily Activity     Outcome Measure Help from another person eating meals?: None Help from another person taking care of personal grooming?: A Little Help from another person toileting, which includes using toliet, bedpan, or urinal?: A Little Help from another person bathing (including washing, rinsing, drying)?: A Little Help from another person to put on and taking off  regular upper body clothing?: A Little Help from another person to put on and taking off regular lower body clothing?: A Little 6 Click Score: 19   End of Session Nurse Communication: Mobility status  Activity Tolerance: Patient tolerated treatment well Patient left: with call bell/phone within reach;Other (comment);with family/visitor present (seated EOB)  OT Visit Diagnosis: Other abnormalities of gait and mobility (R26.89);Muscle weakness (generalized) (M62.81);Pain Pain - Right/Left: Left Pain - part of body:  (PPM site)                Time: [id number] OT Time Calculation (min): 23 min Charges:  OT General Charges $OT Visit: 1 Visit OT Evaluation $OT Eval Low Complexity: 1 Low OT Treatments $Self Care/Home Management : 8-22 mins  Jolaine Artist, OT Acute Rehabilitation Services Pager [phone] Office [phone]   Delight Stare 07/05/2021, 9:25 AM

## 2021-07-07 ENCOUNTER — Telehealth: Payer: Self-pay | Admitting: Family Medicine

## 2021-07-07 NOTE — Telephone Encounter (Signed)
**  After Hours/ Emergency Line Call**  Received a call to report that [name] .  Endorsing continued extremity swelling that hasn't improved since discharge.  Recently discharged from hospital for Heart Failure exacerbation and pace maker implantation.  Started on lasix 40 mg PO.  Denying difficulty breathing, worsening swelling or chest pain.  Has follow-up on Monday.  Also has future follow-up with Cardiology. Red flags discussed.  Will forward to PCP.  Gerlene Fee, DO PGY-2, [facility] 07/07/2021 10:06 PM

## 2021-07-08 NOTE — Patient Instructions (Addendum)
Mr. Cuartas, It was wonderful to seeing you today. Thank you for allowing me to be a part of your care. Below is a short summary of what we discussed at your visit today:   Hospitalization for Bradycardia We discussed your hospitalization due to low HR in the 30-40s. You reported you underwent a pacemaker implant. And deny having any episodes of low heart rate or dizziness.  Chronic pain You expressed your last norco refill was 5-325 mg. Your pain was well controlled when you were on 7.5 -325 norco and would like to get back to the previous dose. I recommended that you take 1.5 of the 5-'325mg'$  of the norco and this should last you until August 12. I will put in an order for 7.5-325 mg norco to start Aug 13. Your last UDS was about 2 years ago so you are due for a UDS and we will   Vaccine  You are over due for the second dose of your shingles and pneumococcal vaccines. You completed your second does of pneumococcal vaccine today and the shingles vaccine order is sent to the pharmacy.  Please bring all of your medications to every appointment!  If you have any questions or concerns, please do not hesitate to contact us via phone or MyChart message.   Alen Bleacher, MD [facility]

## 2021-07-08 NOTE — Progress Notes (Deleted)
    SUBJECTIVE:   CHIEF COMPLAINT / HPI:   Hospitalization f/u for Bradycardia  Chronic pain on Norco  Patient with chronic pain 2/2 to cervical spinal fusion, lumbar stenosis and bilateral knee arthritis.    PERTINENT  PMH / PSH: COPD, PVD, HFpEF, CAD, GERD, OA and chronic tobacco use  OBJECTIVE:   Vitals  Physical Exam General: Cardiovascular: Respiratory: Abdomen: Skin: Extremity:  ASSESSMENT/PLAN:   No problem-specific Assessment & Plan notes found for this encounter.     Alen Bleacher, MD [place]   {    This will disappear when note is signed, click to select method of visit    :1}

## 2021-07-09 ENCOUNTER — Encounter: Payer: BC Managed Care – PPO | Admitting: Student

## 2021-07-09 DIAGNOSIS — G8929 Other chronic pain: Secondary | ICD-10-CM

## 2021-07-09 DIAGNOSIS — G894 Chronic pain syndrome: Secondary | ICD-10-CM

## 2021-07-18 ENCOUNTER — Encounter: Payer: Self-pay | Admitting: Cardiology

## 2021-07-18 ENCOUNTER — Encounter: Payer: Self-pay | Admitting: Student

## 2021-07-18 ENCOUNTER — Ambulatory Visit (INDEPENDENT_AMBULATORY_CARE_PROVIDER_SITE_OTHER): Payer: BC Managed Care – PPO

## 2021-07-18 ENCOUNTER — Other Ambulatory Visit: Payer: Self-pay

## 2021-07-18 ENCOUNTER — Ambulatory Visit: Payer: BC Managed Care – PPO | Admitting: Cardiology

## 2021-07-18 VITALS — BP 119/82 | HR 102 | Temp 98.1°F | Resp 16 | Ht 68.0 in | Wt 262.2 lb

## 2021-07-18 DIAGNOSIS — F17210 Nicotine dependence, cigarettes, uncomplicated: Secondary | ICD-10-CM | POA: Diagnosis not present

## 2021-07-18 DIAGNOSIS — R001 Bradycardia, unspecified: Secondary | ICD-10-CM

## 2021-07-18 DIAGNOSIS — R0602 Shortness of breath: Secondary | ICD-10-CM

## 2021-07-18 DIAGNOSIS — R0989 Other specified symptoms and signs involving the circulatory and respiratory systems: Secondary | ICD-10-CM | POA: Diagnosis not present

## 2021-07-18 DIAGNOSIS — Z720 Tobacco use: Secondary | ICD-10-CM | POA: Diagnosis not present

## 2021-07-18 DIAGNOSIS — R5383 Other fatigue: Secondary | ICD-10-CM

## 2021-07-18 DIAGNOSIS — I442 Atrioventricular block, complete: Secondary | ICD-10-CM

## 2021-07-18 DIAGNOSIS — E785 Hyperlipidemia, unspecified: Secondary | ICD-10-CM | POA: Diagnosis not present

## 2021-07-18 DIAGNOSIS — Z950 Presence of cardiac pacemaker: Secondary | ICD-10-CM

## 2021-07-18 DIAGNOSIS — J439 Emphysema, unspecified: Secondary | ICD-10-CM

## 2021-07-18 DIAGNOSIS — I441 Atrioventricular block, second degree: Secondary | ICD-10-CM | POA: Diagnosis not present

## 2021-07-18 DIAGNOSIS — I5032 Chronic diastolic (congestive) heart failure: Secondary | ICD-10-CM

## 2021-07-18 DIAGNOSIS — I10 Essential (primary) hypertension: Secondary | ICD-10-CM

## 2021-07-18 DIAGNOSIS — I452 Bifascicular block: Secondary | ICD-10-CM | POA: Diagnosis not present

## 2021-07-18 DIAGNOSIS — Z8679 Personal history of other diseases of the circulatory system: Secondary | ICD-10-CM

## 2021-07-18 DIAGNOSIS — Z09 Encounter for follow-up examination after completed treatment for conditions other than malignant neoplasm: Secondary | ICD-10-CM

## 2021-07-18 LAB — CUP PACEART INCLINIC DEVICE CHECK
Battery Remaining Longevity: 90 mo
Brady Statistic RA Percent Paced: 0 %
Brady Statistic RV Percent Paced: 99 %
Date Time Interrogation Session: 20220810111717
Implantable Lead Implant Date: 20220727
Implantable Lead Implant Date: 20220727
Implantable Lead Location: 753859
Implantable Lead Location: 753860
Implantable Lead Model: 377171
Implantable Lead Model: 377171
Implantable Lead Serial Number: 8000456432
Implantable Lead Serial Number: 8000477614
Implantable Pulse Generator Implant Date: 20220727
Lead Channel Impedance Value: 585 Ohm
Lead Channel Impedance Value: 604 Ohm
Lead Channel Pacing Threshold Amplitude: 0.5 V
Lead Channel Pacing Threshold Amplitude: 1.2 V
Lead Channel Pacing Threshold Pulse Width: 0.4 ms
Lead Channel Pacing Threshold Pulse Width: 0.4 ms
Lead Channel Sensing Intrinsic Amplitude: 15 mV
Lead Channel Sensing Intrinsic Amplitude: 6.5 mV
Lead Channel Setting Pacing Amplitude: 3 V
Lead Channel Setting Pacing Amplitude: 3.6 V
Lead Channel Setting Pacing Pulse Width: 0.4 ms
Pulse Gen Model: 407145
Pulse Gen Serial Number: 70164524

## 2021-07-18 MED ORDER — EMPAGLIFLOZIN 10 MG PO TABS: 10.0000 mg | ORAL_TABLET | Freq: Every day | ORAL | 0 refills | Status: DC

## 2021-07-18 MED ORDER — METOPROLOL SUCCINATE ER 50 MG PO TB24: 50.0000 mg | ORAL_TABLET | Freq: Every day | ORAL | 0 refills | Status: DC

## 2021-07-18 NOTE — Progress Notes (Signed)
Date:  07/18/2021   ID:  GAZA LOREDO, DOB 07/17/1956, MRN [id number]  PCP:  Alen Bleacher, MD  Cardiologist:  Rex Kras, DO, [facility] (established care 07/02/2021)  Date: 07/18/21 Last Office Visit: 07/02/2021  Chief Complaint  Patient presents with   Symptomatic bradycardia    Status post pacemaker implantation   posthospitalization   Shortness of Breath   Follow-up    HPI  George Leon is a 65 y.o. male who presents to the office with a chief complaint of " hospital follow-up, status post pacemaker, congestive heart failure management." Patient's past medical history and cardiovascular risk factors include: Status post dual-chamber pacemaker for symptomatic bradycardia and complete heart block, HFpEF, hypertension, peripheral vascular disease (per patient), hyperlipidemia, obesity due to excess calories, tobacco use, COPD.  He is referred to the office at the request of Alen Bleacher, MD for evaluation of chronic diastolic congestive heart failure & peripheral vascular disease.  During initial office visit EKG was concerning for second-degree type II AV block and high degree AV block and given his symptoms of shortness of breath, tired, fatigue, decreased energy, and no change in his heart rate with increased physical exertion that shared decision was to go to the ER for further evaluation and management.  During the recent hospitalization patient was found to have high degree AV block including complete heart block and underwent dual-chamber pacemaker implantation with Dr. Crissie Sickles.  Prior to pacemaker implantation he also underwent left heart catheterization to rule out obstructive CAD.  Echocardiogram noted diastolic dysfunction and left heart catheterization noted elevated LVEDP.  Patient's medications were uptitrated and goal is to continue titration of GDMT when seen in the clinic.  Patient went to his device clinic appointment earlier this morning and was informed  that his pacemaker is functioning appropriately and has follow-up appointments until February 2023.  Unfortunately continues to smoke 3 to 4 cigarettes/day.  FUNCTIONAL STATUS: No structured exercise program or daily routine.   ALLERGIES: Allergies  Allergen Reactions   Adhesive [Tape] Rash    Paper tape is what bothers him Pulled skin off also   Hydrochlorothiazide Other (See Comments)    Back Pain & gives him kidney stones   Sulfamethoxazole Swelling and Other (See Comments)    Tongue Swelling. Pt states he has had a sulfa drug since and has tolerated them.    MEDICATION LIST PRIOR TO VISIT: Current Meds  Medication Sig   acetaminophen (TYLENOL) 325 MG tablet Take 2 tablets (650 mg total) by mouth every 4 (four) hours as needed for headache or mild pain.   albuterol (PROAIR HFA) 108 (90 Base) MCG/ACT inhaler Inhale 1-2 puffs into the lungs every 4 (four) hours as needed. As needed for shortness of breath /wheeze/cough   amLODipine (NORVASC) 5 MG tablet TAKE 1 TABLET DAILY   atorvastatin (LIPITOR) 80 MG tablet TAKE 1 TABLET DAILY   DULoxetine (CYMBALTA) 60 MG capsule TAKE 1 CAPSULE DAILY   Elastic Bandages & Supports (MEDICAL COMPRESSION STOCKINGS) MISC 1 each by Does not apply route once.   empagliflozin (JARDIANCE) 10 MG TABS tablet Take 1 tablet (10 mg total) by mouth daily before breakfast.   esomeprazole (NEXIUM) 40 MG capsule TAKE 1 CAPSULE DAILY AT 12 NOON   ezetimibe (ZETIA) 10 MG tablet Take 1 tablet (10 mg total) by mouth daily.   HYDROcodone-acetaminophen (NORCO) 7.5-325 MG tablet Take 1 tablet by mouth every 6 (six) hours as needed for moderate pain.   losartan (COZAAR) 100 MG  tablet TAKE 1 TABLET AT BEDTIME   metoprolol succinate (TOPROL XL) 50 MG 24 hr tablet Take 1 tablet (50 mg total) by mouth daily. Take with or immediately following a meal.   Multiple Vitamins-Minerals (OCUVITE ADULT FORMULA) CAPS Take 1 capsule by mouth daily.   naloxone (NARCAN) 0.4 MG/ML  injection Use as needed for signs of overdose.   nicotine ([employer] NICOTINE) 21 mg/24hr patch PLACE 1 PATCH ONTO SKIN DAILY   nystatin cream (MYCOSTATIN) APPLY TOPICALLY TWICE A DAY   selenium sulfide (SELSUN) 2.5 % shampoo Apply 1 application topically daily as needed for irritation.   sodium chloride (OCEAN) 0.65 % SOLN nasal spray Place 1 spray into both nostrils as needed for congestion.   umeclidinium bromide (INCRUSE ELLIPTA) 62.5 MCG/INH AEPB Inhale 1 puff into the lungs daily.   [DISCONTINUED] furosemide (LASIX) 40 MG tablet TAKE 1 TABLET(40 MG) BY MOUTH DAILY     PAST MEDICAL HISTORY: Past Medical History:  Diagnosis Date   Acute MI ([place])    Acute on chronic diastolic heart failure (HCC) 12/01/2015   Anxiety    Arthritis    "qwhere" (08/06/2017)   CHF (congestive heart failure) ([place]) dx'd 2016   Chronic back pain    "all my back" (08/06/2017)   Chronic bronchitis (HCC)    Chronic pain    Chronic pain syndrome 12/01/2015   Complete heart block (HCC)    COPD (chronic obstructive pulmonary disease) (HCC)    Dyspnea    w/ exertion    Emphysema of lung ([place])    Encounter for care of pacemaker 07/04/2021   High cholesterol    History of hiatal hernia    possible     Hypertension    Liver fatty degeneration    Macular degeneration, right eye    Pacemaker Dual chamber Biotronik [place] 8dr-T Mri - [id number] pacemaker 07/04/2021 07/04/2021   Pneumonia  1990s X 1; 2016    PAST SURGICAL HISTORY: Past Surgical History:  Procedure Laterality Date   ANTERIOR CERVICAL DECOMP/DISCECTOMY FUSION  1988   "plate; cadavar bone; couple bolts"   BACK SURGERY     CARDIAC CATHETERIZATION     JOINT REPLACEMENT     KNEE ARTHROSCOPY Left 05/27/2017   Procedure: LEFT KNEE ARTHROSCOPY WITH PARTIAL MEDIAL MENISCECTOMY;  Surgeon: Mcarthur Rossetti, MD;  [facility];  Service: Orthopedics;  Laterality: Left;   LEFT HEART CATH AND CORONARY ANGIOGRAPHY N/A 07/03/2021   Procedure: LEFT HEART CATH  AND CORONARY ANGIOGRAPHY;  Surgeon: [name], MD;  Location: [place] [facility];  Service: Cardiovascular;  Laterality: N/A;   PACEMAKER IMPLANT N/A 07/04/2021   Procedure: PACEMAKER IMPLANT;  Surgeon: Evans Lance, MD;  Location: [place] [facility];  Service: Cardiovascular;  Laterality: N/A;   SHOULDER ARTHROSCOPY WITH ROTATOR CUFF REPAIR Right    TOTAL HIP ARTHROPLASTY Left 08/05/2017   Procedure: LEFT TOTAL HIP ARTHROPLASTY ANTERIOR APPROACH;  Surgeon: Mcarthur Rossetti, MD;  Location: [facility];  Service: Orthopedics;  Laterality: Left;    FAMILY HISTORY: The patient family history includes AAA (abdominal aortic aneurysm) in his father; Hypertension in his father.  SOCIAL HISTORY:  The patient  reports that he has been smoking cigarettes. He has a 45.00 pack-year smoking history. He has quit using smokeless tobacco.  His smokeless tobacco use included chew. He reports current alcohol use. He reports current drug use. Drug: Marijuana.  REVIEW OF SYSTEMS: Review of Systems  Constitutional: Negative for chills and fever.  HENT:  Negative  for hoarse voice and nosebleeds.   Eyes:  Negative for discharge, double vision and pain.  Cardiovascular:  Positive for dyspnea on exertion (improved). Negative for chest pain, claudication, leg swelling, near-syncope, orthopnea, palpitations, paroxysmal nocturnal dyspnea and syncope.  Respiratory:  Positive for shortness of breath (improved). Negative for hemoptysis.   Musculoskeletal:  Negative for muscle cramps and myalgias.  Gastrointestinal:  Negative for abdominal pain, constipation, diarrhea, hematemesis, hematochezia, melena, nausea and vomiting.  Neurological:  Negative for dizziness and light-headedness.   PHYSICAL EXAM: Vitals with BMI 07/18/2021 07/05/2021 07/05/2021  Height '5\' 8"'$  - -  Weight 262 lbs 3 oz - -  BMI [id number] - -  Systolic 123456 0000000 -  Diastolic 82 91 -  Pulse [id number] 84 81    CONSTITUTIONAL: Appears older than stated age,  hemodynamically stable, no acute distress. SKIN: Skin is warm and dry. No rash noted. No cyanosis. No pallor. No jaundice HEAD: Normocephalic and atraumatic.  EYES: No scleral icterus MOUTH/THROAT: Moist oral membranes.  NECK: No JVD present. No thyromegaly noted. No carotid bruits  LYMPHATIC: No visible cervical adenopathy.  CHEST Normal respiratory effort. No intercostal retractions.  Pacemaker site healing well. LUNGS: Decreased breath sounds bilaterally with expiratory wheezes present.  No stridor. No rales.  CARDIOVASCULAR: Regular, positive S1-S2, no murmurs rubs or gallops appreciated. ABDOMINAL: Obese, soft, nontender, nondistended, positive bowel sounds in all 4 quadrants, no apparent ascites.  EXTREMITIES: +1 bilateral peripheral edema, compression stocks present, +2 DP and +1 PT bilateral. HEMATOLOGIC: No significant bruising NEUROLOGIC: Oriented to person, place, and time. Nonfocal. Normal muscle tone.  PSYCHIATRIC: Normal mood and affect. Normal behavior. Cooperative  CARDIAC DATABASE: EKG: 07/02/2021: Normal sinus rhythm, 62 bpm, left axis, left anterior fascicular block, right bundle branch block, second-degree type II AV block, without underlying injury pattern.  07/02/2021: Sinus bradycardia, 54 bpm, right bundle branch block, left axis, left anterior fascicular block, second-degree type II AV block with ventricular escape beats.  07/18/2021: Atrial sensed ventricular paced rhythm, 108 bpm.  Echocardiogram: 07/03/2021: LVEF 60-65%, no regional wall motion abnormalities, mild LVH, grade 1 diastolic impairment, normal right ventricular size and function, mild MR, right atrial pressure 15 mmHg.  Stress Testing: No results found for this or any previous visit from the past 1095 days.  Heart catheterization: Left Heart Catheterization 07/03/21: No significant CAD.  Moderately elevated EDP. LV: 125 mmHg / 12 mmHg, EDP 22 mmHg.  Aortic pressure 109/46 with a mean of 69 mmHg.   There was no pressure gradient across the aortic valve.  LVEDP moderately elevated.  LVEF 65%, no significant MR. LM: Large vessel, normal. LAD: Very large vessel, gives origin to 3 large diagonals, very mild disease is evident with mild calcification in the proximal segment. CX: Moderate sized vessel, smooth and normal. RCA: Very large vessel, PL branch is very large.  There is mild disease in the distal RCA.  Mild calcification is evident in the proximal segment. Recommendation: No reversible cause for heart block.  He will be scheduled for pacemaker implantation tomorrow.  Dual-chamber pacemaker implantation: 07/04/2021:  1. Successful implantation of a Biotronik dual-chamber pacemaker for symptomatic bradycardia due to complete heart block  LABORATORY DATA: CBC Latest Ref Rng & Units 07/05/2021 07/04/2021 07/03/2021  WBC 4.0 - 10.5 K/uL 12.5(H) 14.6(H) 15.5(H)  Hemoglobin 13.0 - 17.0 g/dL 12.7(L) 12.7(L) 12.3(L)  Hematocrit 39.0 - 52.0 % 39.1 39.5 39.5  Platelets 150 - 400 K/uL 197 229 210    CMP Latest Ref Rng & Units 07/05/2021  07/04/2021 07/03/2021  Glucose 70 - 99 mg/dL 108(H) 106(H) 88  BUN 8 - 23 mg/dL 7(L) 13 15  Creatinine 0.61 - 1.24 mg/dL 0.71 0.78 0.99  Sodium 135 - 145 mmol/L 136 137 136  Potassium 3.5 - 5.1 mmol/L 3.6 3.6 3.9  Chloride 98 - 111 mmol/L 103 105 106  CO2 22 - 32 mmol/L 26 23 20(L)  Calcium 8.9 - 10.3 mg/dL 8.6(L) 8.7(L) 8.4(L)  Total Protein 6.5 - 8.1 g/dL - 5.9(L) -  Total Bilirubin 0.3 - 1.2 mg/dL - 0.7 -  Alkaline Phos 38 - 126 U/L - 79 -  AST 15 - 41 U/L - 27 -  ALT 0 - 44 U/L - 32 -    Lipid Panel  Lab Results  Component Value Date   CHOL 146 04/02/2021   HDL 35 (L) 04/02/2021   LDLCALC 86 04/02/2021   LDLDIRECT 85 07/22/2012   TRIG 140 04/02/2021   CHOLHDL 4.2 04/02/2021   No components found for: NTPROBNP No results for input(s): PROBNP in the last 8760 hours. Recent Labs    07/02/21 2013  TSH 1.306    BMP Recent Labs     07/03/21 0438 07/04/21 0222 07/05/21 0326  NA 136 137 136  K 3.9 3.6 3.6  CL 106 105 103  CO2 20* 23 26  GLUCOSE 88 106* 108*  BUN 15 13 7*  CREATININE 0.99 0.78 0.71  CALCIUM 8.4* 8.7* 8.6*  GFRNONAA >60 >60 >60     HEMOGLOBIN A1C Lab Results  Component Value Date   HGBA1C 6.1 08/04/2017    IMPRESSION:    ICD-10-CM   1. Chronic heart failure with preserved ejection fraction (HCC)  I50.32 metoprolol succinate (TOPROL XL) 50 MG 24 hr tablet    empagliflozin (JARDIANCE) 10 MG TABS tablet    Basic metabolic panel    Magnesium    Pro b natriuretic peptide (BNP)    2. Hospital discharge follow-up  Z09     3. Pacemaker Dual chamber Biotronik [place] 8dr-T Mri - [id number] pacemaker 07/04/2021  Z95.0     4. History of complete heart block  Z86.79     5. History of symptomatic bradycardia  R00.1 EKG 12-Lead    6. Second degree AV block, Mobitz type II  I44.1     7. RBBB (right bundle branch block with left anterior fascicular block)  I45.2     8. Benign hypertension  I10     9. Hyperlipidemia  E78.5     10. Pulmonary emphysema, unspecified emphysema type ([place])  J43.9     11. Tobacco abuse  Z72.0     12. Diminished posterior tibial pulses bilaterally  R09.89 PCV ANKLE BRACHIAL INDEX (ABI)       RECOMMENDATIONS: George Leon is a 65 y.o. male whose past medical history and cardiac risk factors include: Status post dual-chamber pacemaker for symptomatic bradycardia and complete heart block, HFpEF, hypertension, peripheral vascular disease (per patient), hyperlipidemia, obesity due to excess calories, tobacco use, COPD.  Chronic heart failure with preserved EF, stage C, NYHA class II/III: Appears euvolemic. Shortness of breath improving compared to last visit. Lost approximately 10 pounds since last office visit. Medications reconciled Start Toprol-XL 50 mg p.o. daily Discontinue Lasix Start Jardiance 10 mg p.o. daily Blood work in 1 week after initiation of  Jardiance.  Samples as well as a coupon code provided to the patient Currently on losartan plans to transition to [facility] during follow-up visits depending on his hemodynamics and  laboratory values. Low-salt diet. Strict I's and O's and daily weights.  Status post dual-chamber pacemaker due to symptomatic bradycardia and complete heart block: Currently follows up with Dr. Tanna Furry office -has appointments until February 2023. Will monitor peripherally  Diminished peripheral pulses: Patient was initially referred to the office for PVD evaluation by his primary care provider. Patient's posterior tibial pulses are diminished relative to his dorsalis pedis bilaterally.  Will check ankle-brachial index to screen for PAD. Denies claudication. Educated on the importance of improving his modifiable cardiovascular risk factors.  Cigarette smoking: Tobacco cessation counseling: Currently smoking 4 to 5 cigarettes/day.  Patient was informed of the dangers of tobacco abuse including stroke, cancer, and MI, as well as benefits of tobacco cessation. Patient is willing to quit at this time. Approximately 5 mins were spent counseling patient cessation techniques. We discussed various methods to help quit smoking, including deciding on a date to quit, joining a support group, pharmacological agents- nicotine gum/patch/lozenges, chantix.  I will reassess his progress at the next follow-up visit   Patient has an upcoming appointment with PCP on July 23, 2021.  I will see him back in 1 month to uptitrate his GDMT as hemodynamics and laboratory values allow.  Patient is thankful for the care and advice provided during the last office visit.   FINAL MEDICATION LIST END OF ENCOUNTER: Meds ordered this encounter  Medications   metoprolol succinate (TOPROL XL) 50 MG 24 hr tablet    Sig: Take 1 tablet (50 mg total) by mouth daily. Take with or immediately following a meal.    Dispense:  90 tablet    Refill:   0   empagliflozin (JARDIANCE) 10 MG TABS tablet    Sig: Take 1 tablet (10 mg total) by mouth daily before breakfast.    Dispense:  90 tablet    Refill:  0     Medications Discontinued During This Encounter  Medication Reason   furosemide (LASIX) 40 MG tablet     Current Outpatient Medications:    acetaminophen (TYLENOL) 325 MG tablet, Take 2 tablets (650 mg total) by mouth every 4 (four) hours as needed for headache or mild pain., Disp: , Rfl:    albuterol (PROAIR HFA) 108 (90 Base) MCG/ACT inhaler, Inhale 1-2 puffs into the lungs every 4 (four) hours as needed. As needed for shortness of breath /wheeze/cough, Disp: 18 g, Rfl: 1   amLODipine (NORVASC) 5 MG tablet, TAKE 1 TABLET DAILY, Disp: 90 tablet, Rfl: 3   atorvastatin (LIPITOR) 80 MG tablet, TAKE 1 TABLET DAILY, Disp: 90 tablet, Rfl: 3   DULoxetine (CYMBALTA) 60 MG capsule, TAKE 1 CAPSULE DAILY, Disp: 90 capsule, Rfl: 3   Elastic Bandages & Supports (MEDICAL COMPRESSION STOCKINGS) MISC, 1 each by Does not apply route once., Disp: 2 each, Rfl: 0   empagliflozin (JARDIANCE) 10 MG TABS tablet, Take 1 tablet (10 mg total) by mouth daily before breakfast., Disp: 90 tablet, Rfl: 0   esomeprazole (NEXIUM) 40 MG capsule, TAKE 1 CAPSULE DAILY AT 12 NOON, Disp: 90 capsule, Rfl: 3   ezetimibe (ZETIA) 10 MG tablet, Take 1 tablet (10 mg total) by mouth daily., Disp: 90 tablet, Rfl: 3   HYDROcodone-acetaminophen (NORCO) 7.5-325 MG tablet, Take 1 tablet by mouth every 6 (six) hours as needed for moderate pain., Disp: 120 tablet, Rfl: 0   losartan (COZAAR) 100 MG tablet, TAKE 1 TABLET AT BEDTIME, Disp: 90 tablet, Rfl: 3   metoprolol succinate (TOPROL XL) 50 MG 24 hr tablet,  Take 1 tablet (50 mg total) by mouth daily. Take with or immediately following a meal., Disp: 90 tablet, Rfl: 0   Multiple Vitamins-Minerals (OCUVITE ADULT FORMULA) CAPS, Take 1 capsule by mouth daily., Disp: 90 capsule, Rfl: 3   naloxone (NARCAN) 0.4 MG/ML injection, Use as  needed for signs of overdose., Disp: 1 mL, Rfl: 0   nicotine ([employer] NICOTINE) 21 mg/24hr patch, PLACE 1 PATCH ONTO SKIN DAILY, Disp: 42 patch, Rfl: 2   nystatin cream (MYCOSTATIN), APPLY TOPICALLY TWICE A DAY, Disp: 30 g, Rfl: 23   selenium sulfide (SELSUN) 2.5 % shampoo, Apply 1 application topically daily as needed for irritation., Disp: 118 mL, Rfl: 2   sodium chloride (OCEAN) 0.65 % SOLN nasal spray, Place 1 spray into both nostrils as needed for congestion., Disp: , Rfl:    umeclidinium bromide (INCRUSE ELLIPTA) 62.5 MCG/INH AEPB, Inhale 1 puff into the lungs daily., Disp: 90 each, Rfl: 3  Orders Placed This Encounter  Procedures   Basic metabolic panel   Magnesium   Pro b natriuretic peptide (BNP)   EKG 12-Lead   PCV ANKLE BRACHIAL INDEX (ABI)    There are no Patient Instructions on file for this visit.   --Continue cardiac medications as reconciled in final medication list. --Return in about 4 weeks (around 08/15/2021) for Follow up HFpEF. Or sooner if needed. --Continue follow-up with your primary care physician regarding the management of your other chronic comorbid conditions.  Patient's questions and concerns were addressed to his satisfaction. He voices understanding of the instructions provided during this encounter.   This note was created using a voice recognition software as a result there may be grammatical errors inadvertently enclosed that do not reflect the nature of this encounter. Every attempt is made to correct such errors.  Rex Kras, [place], [facility]  Pager: [phone] Office: [phone]

## 2021-07-18 NOTE — Patient Instructions (Signed)

## 2021-07-18 NOTE — Progress Notes (Signed)
Wound check appointment post PPM implant 07/04/21. Steri-strips removed. Wound without redness or edema. Incision edges approximated, wound well healed. Normal device function. Thresholds, sensing, and impedances consistent with implant measurements. Device programmed at 3.0V/3.6V for extra safety margin until 3 month visit. Histogram distribution appropriate for patient and level of activity. No mode switches or high ventricular rates noted. Patient educated about wound care, arm mobility, lifting restrictions. Enrolled in remote monitoring with next transmission scheduled 10/16/21. 91 day inclinic follow up with Dr. Lovena Le 10/09/21.

## 2021-07-18 NOTE — Telephone Encounter (Signed)
Pt will be coming in tomorrow to pick up a patient assistance packet.

## 2021-07-19 ENCOUNTER — Other Ambulatory Visit: Payer: Self-pay

## 2021-07-19 DIAGNOSIS — I5032 Chronic diastolic (congestive) heart failure: Secondary | ICD-10-CM

## 2021-07-19 MED ORDER — EMPAGLIFLOZIN 10 MG PO TABS: 10.0000 mg | ORAL_TABLET | Freq: Every day | ORAL | 0 refills | Status: DC

## 2021-07-19 NOTE — Telephone Encounter (Signed)
Wilder Glade is an alternative but will be just as expensive. Let me know your thoughts.

## 2021-07-19 NOTE — Telephone Encounter (Signed)
From pt

## 2021-07-20 ENCOUNTER — Other Ambulatory Visit: Payer: Self-pay | Admitting: Family Medicine

## 2021-07-20 ENCOUNTER — Other Ambulatory Visit: Payer: Self-pay | Admitting: Student

## 2021-07-20 ENCOUNTER — Telehealth: Payer: Self-pay

## 2021-07-20 DIAGNOSIS — G8929 Other chronic pain: Secondary | ICD-10-CM

## 2021-07-20 DIAGNOSIS — J439 Emphysema, unspecified: Secondary | ICD-10-CM

## 2021-07-20 DIAGNOSIS — G894 Chronic pain syndrome: Secondary | ICD-10-CM

## 2021-07-20 DIAGNOSIS — M5442 Lumbago with sciatica, left side: Secondary | ICD-10-CM

## 2021-07-20 MED ORDER — HYDROCODONE-ACETAMINOPHEN 7.5-325 MG PO TABS: 1.0000 | ORAL_TABLET | Freq: Four times a day (QID) | ORAL | 0 refills | Status: DC | PRN

## 2021-07-20 NOTE — Telephone Encounter (Signed)
Patient calls nurse line regarding issues with hydrocodone rx. [facility] cannot [facility] controlled substances.   Please resend to [facility] on [address].  Talbot Grumbling, RN

## 2021-07-23 ENCOUNTER — Other Ambulatory Visit: Payer: Self-pay

## 2021-07-23 ENCOUNTER — Encounter: Payer: Self-pay | Admitting: Student

## 2021-07-23 ENCOUNTER — Ambulatory Visit (INDEPENDENT_AMBULATORY_CARE_PROVIDER_SITE_OTHER): Payer: BC Managed Care – PPO | Admitting: Student

## 2021-07-23 VITALS — BP 128/68 | HR 70 | Wt 261.0 lb

## 2021-07-23 DIAGNOSIS — M5442 Lumbago with sciatica, left side: Secondary | ICD-10-CM | POA: Diagnosis not present

## 2021-07-23 DIAGNOSIS — G894 Chronic pain syndrome: Secondary | ICD-10-CM

## 2021-07-23 DIAGNOSIS — Z23 Encounter for immunization: Secondary | ICD-10-CM | POA: Diagnosis not present

## 2021-07-23 DIAGNOSIS — I5032 Chronic diastolic (congestive) heart failure: Secondary | ICD-10-CM

## 2021-07-23 DIAGNOSIS — I739 Peripheral vascular disease, unspecified: Secondary | ICD-10-CM | POA: Diagnosis not present

## 2021-07-23 DIAGNOSIS — I442 Atrioventricular block, complete: Secondary | ICD-10-CM

## 2021-07-23 DIAGNOSIS — G8929 Other chronic pain: Secondary | ICD-10-CM

## 2021-07-23 MED ORDER — HYDROCODONE-ACETAMINOPHEN 7.5-325 MG PO TABS: 1.0000 | ORAL_TABLET | Freq: Four times a day (QID) | ORAL | 0 refills | Status: DC | PRN

## 2021-07-23 NOTE — Patient Instructions (Addendum)
Mr. Crawn,  It was wonderful to see you today. Thank you for allowing me to be a part of your care. Below is a short summary of what we discussed at your visit today:  You expressed your low heart rate has improved and you no longer have the dizziness associated with low heart rate  We completed the routine Urine Drug screens which is a requirement for patients on narcotics long term.   Refilled your Norco with the usual 7.'5mg'$  dose  Ordered lab for Magnesium, PrBNP and BMP    If you have any questions or concerns, please do not hesitate to contact us via phone or MyChart message.   Alen Bleacher, MD [facility]

## 2021-07-23 NOTE — Assessment & Plan Note (Signed)
Patient with chronic back pain treated with Norco 7.5. Report pain to be 7/10 which improves with his pain medication. He denies any change with pain intensity or new associated symptoms -Ordered UDS; Last UDS was 06/2020 -Refilled Norco 7.81m'325mg'$   Q6H PRN

## 2021-07-23 NOTE — Progress Notes (Signed)
SUBJECTIVE:   CHIEF COMPLAINT / HPI: Hospitalization follow up  Complete heart Block  s/p Anterior Chamber Pacemaker Implants   [name] is a 65 year old male with past medical history significant for complete heart block, HFpEF, HTN, PVD, and hyperlipidemia who presents today for follow-up of hospitalization for s/p dual chamber pacemaker implant for symptomatic bradycardia and complete heart block. Patient is today's visit says his HR measured on his apple watch in the 70s and 80s.  Patient denies any chest pains or dizziness.   Chronic back pain Patient said he has a longstanding history of chronic back pain which is relieved by his pain medication.  He rates his pain 7 out of 10 and denies any change in intensity or other associated symptoms.  The pain does not radiate and is worse with movement and when he does not take his medication.  He said he is due for refills today.   PMH: Complete heart block, HFpEF, HTN, PVD, chronic pain syndrome, COPD, and pneumonia   PSH Past Surgical History:  Procedure Laterality Date   ANTERIOR CERVICAL DECOMP/DISCECTOMY FUSION  1988   "plate; cadavar bone; couple bolts"   BACK SURGERY     CARDIAC CATHETERIZATION     JOINT REPLACEMENT     KNEE ARTHROSCOPY Left 05/27/2017   Procedure: LEFT KNEE ARTHROSCOPY WITH PARTIAL MEDIAL MENISCECTOMY;  Surgeon: Mcarthur Rossetti, MD;  Location: [facility];  Service: Orthopedics;  Laterality: Left;   LEFT HEART CATH AND CORONARY ANGIOGRAPHY N/A 07/03/2021   Procedure: LEFT HEART CATH AND CORONARY ANGIOGRAPHY;  Surgeon: [name], MD;  Location: [facility];  Service: Cardiovascular;  Laterality: N/A;   PACEMAKER IMPLANT N/A 07/04/2021   Procedure: PACEMAKER IMPLANT;  Surgeon: Evans Lance, MD;  Location: [facility];  Service: Cardiovascular;  Laterality: N/A;   SHOULDER ARTHROSCOPY WITH ROTATOR CUFF REPAIR Right    TOTAL HIP ARTHROPLASTY Left 08/05/2017   Procedure: LEFT TOTAL  HIP ARTHROPLASTY ANTERIOR APPROACH;  Surgeon: Mcarthur Rossetti, MD;  Location: [facility];  Service: Orthopedics;  Laterality: Left;   Review of Systems  Constitutional:  Negative for chills, fever and weight loss.  Respiratory:  Negative for cough, hemoptysis and shortness of breath.   Cardiovascular:  Negative for chest pain, palpitations and leg swelling.  Gastrointestinal:  Negative for constipation, diarrhea, heartburn, nausea and vomiting.  Musculoskeletal:  Positive for back pain and joint pain (bilateral knees). Negative for falls and neck pain.    OBJECTIVE:   BP 128/68   Pulse 70   Wt 261 lb (118.4 kg)   BMI 39.68 kg/m     Physical Exam General: Alert, well appearing, NAD, Oriented x4 Cardiovascular: RRR Respiratory: CTAB, No wheezing or Rales Abdomen: No distension or tenderness Extremities: No edema on extremities   Skin: Warm and dry  ASSESSMENT/PLAN:   Chronic pain syndrome Patient with chronic back pain treated with Norco 7.5. Report pain to be 7/10 which improves with his pain medication. He denies any change with pain intensity or new associated symptoms -Ordered UDS; Last UDS was 06/2020 -Refilled Norco 7.1m'325mg'$   Q6H PRN   Complete heart block (HCC) Pt is s/p dual chamber pacemaker implant for symptomatic bradycardia and complete heart block.  He reports heart rates been in the 70s and 80s since his discharge from the hospital.  Denies any dizziness or chest pain. -Continue seeing cardiology team -Ordered labs; pro BNP, Mg, BMP.  Health maintenance Patient with history of COPD, tobacco  use and heart comorbidities is due for pneumococcal PCV 20 vaccine. -Administered pneumococcal vaccine -Recommended getting the flu shot vaccine next months -Advised patient to get the shingles vaccine at any pharmacy.    Alen Bleacher, MD [place]

## 2021-07-23 NOTE — Assessment & Plan Note (Signed)
Pt is s/p dual chamber pacemaker implant for symptomatic bradycardia and complete heart block.  He reports heart rates been in the 70s and 80s since his discharge from the hospital.  Denies any dizziness or chest pain. -Continue seeing cardiology team -Ordered labs; pro BNP, Mg, BMP.

## 2021-07-24 LAB — BASIC METABOLIC PANEL
BUN/Creatinine Ratio: 16 (ref 10–24)
BUN: 11 mg/dL (ref 8–27)
CO2: 23 mmol/L (ref 20–29)
Calcium: 9.1 mg/dL (ref 8.6–10.2)
Chloride: 102 mmol/L (ref 96–106)
Creatinine, Ser: 0.69 mg/dL — ABNORMAL LOW (ref 0.76–1.27)
Glucose: 90 mg/dL (ref 65–99)
Potassium: 4.9 mmol/L (ref 3.5–5.2)
Sodium: 138 mmol/L (ref 134–144)
eGFR: 103 mL/min/{1.73_m2} (ref >59)

## 2021-07-24 LAB — MAGNESIUM: Magnesium: 2.2 mg/dL (ref 1.6–2.3)

## 2021-07-24 LAB — PRO B NATRIURETIC PEPTIDE: NT-Pro BNP: 116 pg/mL (ref 0–210)

## 2021-07-25 ENCOUNTER — Other Ambulatory Visit: Payer: Self-pay

## 2021-07-25 ENCOUNTER — Ambulatory Visit: Payer: Medicare Other

## 2021-07-25 DIAGNOSIS — R0989 Other specified symptoms and signs involving the circulatory and respiratory systems: Secondary | ICD-10-CM

## 2021-07-27 LAB — TOXASSURE SELECT 13 (MW), URINE

## 2021-08-03 ENCOUNTER — Other Ambulatory Visit: Payer: Self-pay

## 2021-08-03 DIAGNOSIS — I5032 Chronic diastolic (congestive) heart failure: Secondary | ICD-10-CM

## 2021-08-05 ENCOUNTER — Other Ambulatory Visit: Payer: Self-pay | Admitting: Cardiology

## 2021-08-05 DIAGNOSIS — I739 Peripheral vascular disease, unspecified: Secondary | ICD-10-CM

## 2021-08-06 NOTE — Progress Notes (Signed)
Spoke to patient he was scheduled this morning for his [place] which is scheduled for this upcoming Friday

## 2021-08-10 ENCOUNTER — Other Ambulatory Visit: Payer: Self-pay

## 2021-08-10 ENCOUNTER — Ambulatory Visit: Payer: BC Managed Care – PPO

## 2021-08-10 DIAGNOSIS — I739 Peripheral vascular disease, unspecified: Secondary | ICD-10-CM

## 2021-08-10 DIAGNOSIS — R0989 Other specified symptoms and signs involving the circulatory and respiratory systems: Secondary | ICD-10-CM | POA: Diagnosis not present

## 2021-08-14 ENCOUNTER — Other Ambulatory Visit: Payer: Self-pay | Admitting: Family Medicine

## 2021-08-14 DIAGNOSIS — I10 Essential (primary) hypertension: Secondary | ICD-10-CM

## 2021-08-20 ENCOUNTER — Encounter: Payer: Self-pay | Admitting: Cardiology

## 2021-08-20 ENCOUNTER — Ambulatory Visit: Payer: BC Managed Care – PPO | Admitting: Cardiology

## 2021-08-20 ENCOUNTER — Encounter: Payer: Self-pay | Admitting: Student

## 2021-08-20 ENCOUNTER — Other Ambulatory Visit: Payer: Self-pay

## 2021-08-20 VITALS — BP 150/84 | HR 92 | Temp 99.1°F | Ht 68.0 in | Wt 256.8 lb

## 2021-08-20 DIAGNOSIS — I739 Peripheral vascular disease, unspecified: Secondary | ICD-10-CM

## 2021-08-20 DIAGNOSIS — I441 Atrioventricular block, second degree: Secondary | ICD-10-CM

## 2021-08-20 DIAGNOSIS — R001 Bradycardia, unspecified: Secondary | ICD-10-CM

## 2021-08-20 DIAGNOSIS — E785 Hyperlipidemia, unspecified: Secondary | ICD-10-CM

## 2021-08-20 DIAGNOSIS — G8929 Other chronic pain: Secondary | ICD-10-CM

## 2021-08-20 DIAGNOSIS — Z950 Presence of cardiac pacemaker: Secondary | ICD-10-CM

## 2021-08-20 DIAGNOSIS — I5032 Chronic diastolic (congestive) heart failure: Secondary | ICD-10-CM

## 2021-08-20 DIAGNOSIS — Z8679 Personal history of other diseases of the circulatory system: Secondary | ICD-10-CM | POA: Diagnosis not present

## 2021-08-20 DIAGNOSIS — Z720 Tobacco use: Secondary | ICD-10-CM

## 2021-08-20 DIAGNOSIS — G894 Chronic pain syndrome: Secondary | ICD-10-CM

## 2021-08-20 DIAGNOSIS — I452 Bifascicular block: Secondary | ICD-10-CM

## 2021-08-20 DIAGNOSIS — I10 Essential (primary) hypertension: Secondary | ICD-10-CM

## 2021-08-20 MED ORDER — ENTRESTO 49-51 MG PO TABS: 1.0000 | ORAL_TABLET | Freq: Two times a day (BID) | ORAL | 0 refills | Status: DC

## 2021-08-20 MED ORDER — METOPROLOL SUCCINATE ER 100 MG PO TB24: 100.0000 mg | ORAL_TABLET | Freq: Every morning | ORAL | 0 refills | Status: DC

## 2021-08-20 NOTE — Progress Notes (Signed)
Date:  08/20/2021   ID:  George Leon, DOB 1956-01-18, MRN [id number]  PCP:  George Bleacher, MD  Cardiologist:  George Kras, DO, [facility] (established care 07/02/2021)  Date: 08/20/21 Last Office Visit: 07/18/2021  Chief Complaint  Patient presents with   Chronic heart failure with preserved ejection fraction    Follow-up    HPI  George Leon is a 65 y.o. male who presents to the office with a chief complaint of " 1 month follow-up for heart failure management and PAD evaluation." Patient's past medical history and cardiovascular risk factors include: Status post dual-chamber pacemaker for symptomatic bradycardia and complete heart block, HFpEF, hypertension, peripheral vascular disease (per patient), hyperlipidemia, obesity due to excess calories, tobacco use, COPD.  He is referred to the office at the request of George Bleacher, MD for evaluation of chronic diastolic congestive heart failure & peripheral vascular disease.  During initial office visit patient was noted to have second-degree type II AV block with episodes of high degree AV block and given his symptoms was sent to the ED for further evaluation and management.  Given his symptomatic bradycardia he underwent dual-chamber pacemaker implantation with Dr. Crissie Leon.  And also underwent left heart catheterization to rule out obstructive CAD.  Patient is now being treated for heart failure with preserved EF.  At the last office visit patient was started on Toprol-XL and Jardiance.  Patient is doing well on both of the medications.  Patient's ventricular rate has improved but based on his smart watch pulse ranges between 80-95 bpm.  Patient has a history of peripheral vascular disease and underwent lower extremity arterial duplex which noted abnormal ABIs consistent with peripheral vascular disease and arterial duplex notes stenosis in the right SFA.  Upon further questioning patient denies any symptoms of claudication.  And he  has also stopped smoking for the last 1 week and is currently wearing a nicotine patch.  FUNCTIONAL STATUS: No structured exercise program or daily routine.   ALLERGIES: Allergies  Allergen Reactions   Adhesive [Tape] Rash    Paper tape is what bothers him Pulled skin off also   Hydrochlorothiazide Other (See Comments)    Back Pain & gives him kidney stones   Sulfamethoxazole Swelling and Other (See Comments)    Tongue Swelling. Pt states he has had a sulfa drug since and has tolerated them.    MEDICATION LIST PRIOR TO VISIT: Current Meds  Medication Sig   acetaminophen (TYLENOL) 325 MG tablet Take 2 tablets (650 mg total) by mouth every 4 (four) hours as needed for headache or mild pain.   albuterol (PROAIR HFA) 108 (90 Base) MCG/ACT inhaler Inhale 1-2 puffs into the lungs every 4 (four) hours as needed. As needed for shortness of breath /wheeze/cough   amLODipine (NORVASC) 5 MG tablet TAKE 1 TABLET DAILY   atorvastatin (LIPITOR) 80 MG tablet TAKE 1 TABLET DAILY   DULoxetine (CYMBALTA) 60 MG capsule TAKE 1 CAPSULE DAILY   Elastic Bandages & Supports (MEDICAL COMPRESSION STOCKINGS) MISC 1 each by Does not apply route once.   empagliflozin (JARDIANCE) 10 MG TABS tablet Take 1 tablet (10 mg total) by mouth daily before breakfast.   esomeprazole (NEXIUM) 40 MG capsule TAKE 1 CAPSULE DAILY AT 12 NOON   ezetimibe (ZETIA) 10 MG tablet Take 1 tablet (10 mg total) by mouth daily.   HYDROcodone-acetaminophen (NORCO) 7.5-325 MG tablet Take 1 tablet by mouth every 6 (six) hours as needed for moderate pain.   INCRUSE  ELLIPTA 62.5 MCG/INH AEPB USE 1 INHALATION DAILY   Multiple Vitamins-Minerals (OCUVITE ADULT FORMULA) CAPS Take 1 capsule by mouth daily.   naloxone (NARCAN) 0.4 MG/ML injection Use as needed for signs of overdose.   nicotine ([employer] NICOTINE) 21 mg/24hr patch PLACE 1 PATCH ONTO SKIN DAILY   nystatin cream (MYCOSTATIN) APPLY TOPICALLY TWICE A DAY   sacubitril-valsartan (ENTRESTO)  49-51 MG Take 1 tablet by mouth 2 (two) times daily.   selenium sulfide (SELSUN) 2.5 % shampoo Apply 1 application topically daily as needed for irritation.   sodium chloride (OCEAN) 0.65 % SOLN nasal spray Place 1 spray into both nostrils as needed for congestion.   [DISCONTINUED] losartan (COZAAR) 100 MG tablet TAKE 1 TABLET AT BEDTIME   [DISCONTINUED] metoprolol succinate (TOPROL XL) 50 MG 24 hr tablet Take 1 tablet (50 mg total) by mouth daily. Take with or immediately following a meal.     PAST MEDICAL HISTORY: Past Medical History:  Diagnosis Date   Acute MI ([place])    Acute on chronic diastolic heart failure ([place]) 12/01/2015   Anxiety    Arthritis    "qwhere" (08/06/2017)   CHF (congestive heart failure) ([place]) dx'd 2016   Chronic back pain    "all my back" (08/06/2017)   Chronic bronchitis (HCC)    Chronic pain    Chronic pain syndrome 12/01/2015   Complete heart block (HCC)    COPD (chronic obstructive pulmonary disease) (HCC)    Dyspnea    w/ exertion    Emphysema of lung ([place])    Encounter for care of pacemaker 07/04/2021   High cholesterol    History of hiatal hernia    possible     Hypertension    Liver fatty degeneration    Macular degeneration, right eye    Pacemaker Dual chamber Biotronik [place] 8dr-T Mri - [id number] pacemaker 07/04/2021 07/04/2021   Pneumonia  1990s X 1; 2016    PAST SURGICAL HISTORY: Past Surgical History:  Procedure Laterality Date   ANTERIOR CERVICAL DECOMP/DISCECTOMY FUSION  1988   "plate; cadavar bone; couple bolts"   BACK SURGERY     CARDIAC CATHETERIZATION     JOINT REPLACEMENT     KNEE ARTHROSCOPY Left 05/27/2017   Procedure: LEFT KNEE ARTHROSCOPY WITH PARTIAL MEDIAL MENISCECTOMY;  Surgeon: Mcarthur Rossetti, MD;  Location: [facility];  Service: Orthopedics;  Laterality: Left;   LEFT HEART CATH AND CORONARY ANGIOGRAPHY N/A 07/03/2021   Procedure: LEFT HEART CATH AND CORONARY ANGIOGRAPHY;  Surgeon: [name], MD;  Location: [facility]  [facility];  Service: Cardiovascular;  Laterality: N/A;   PACEMAKER IMPLANT N/A 07/04/2021   Procedure: PACEMAKER IMPLANT;  Surgeon: Evans Lance, MD;  Location: [facility];  Service: Cardiovascular;  Laterality: N/A;   SHOULDER ARTHROSCOPY WITH ROTATOR CUFF REPAIR Right    TOTAL HIP ARTHROPLASTY Left 08/05/2017   Procedure: LEFT TOTAL HIP ARTHROPLASTY ANTERIOR APPROACH;  Surgeon: Mcarthur Rossetti, MD;  Location: [facility] [place];  Service: Orthopedics;  Laterality: Left;    FAMILY HISTORY: The patient family history includes AAA (abdominal aortic aneurysm) in his father; Hypertension in his father.  SOCIAL HISTORY:  The patient  reports that he has quit smoking. His smoking use included cigarettes. He has a 45.00 pack-year smoking history. He has quit using smokeless tobacco.  His smokeless tobacco use included chew. He reports current alcohol use. He reports current drug use. Drug: Marijuana.  REVIEW OF SYSTEMS: Review of Systems  Constitutional: Negative for chills and fever.  HENT:  Negative for hoarse voice and nosebleeds.   Eyes:  Negative for discharge, double vision and pain.  Cardiovascular:  Positive for dyspnea on exertion (improved). Negative for chest pain, claudication, leg swelling, near-syncope, orthopnea, palpitations, paroxysmal nocturnal dyspnea and syncope.  Respiratory:  Positive for shortness of breath (improved). Negative for hemoptysis.   Musculoskeletal:  Negative for muscle cramps and myalgias.  Gastrointestinal:  Negative for abdominal pain, constipation, diarrhea, hematemesis, hematochezia, melena, nausea and vomiting.  Neurological:  Negative for dizziness and light-headedness.   PHYSICAL EXAM: Vitals with BMI 08/20/2021 07/23/2021 07/18/2021  Height '5\' 8"'$  - '5\' 8"'$   Weight 256 lbs 13 oz 261 lbs 262 lbs 3 oz  BMI 39.06 0000000 [id number]  Systolic Q000111Q 0000000 [id number]  Diastolic 84 68 82  Pulse 92 70 102    CONSTITUTIONAL: Appears older than stated age, hemodynamically  stable, no acute distress. SKIN: Skin is warm and dry. No rash noted. No cyanosis. No pallor. No jaundice HEAD: Normocephalic and atraumatic.  EYES: No scleral icterus MOUTH/THROAT: Moist oral membranes.  NECK: No JVD present. No thyromegaly noted. No carotid bruits  LYMPHATIC: No visible cervical adenopathy.  CHEST Normal respiratory effort. No intercostal retractions.  Pacemaker site healing well. LUNGS: Decreased breath sounds bilaterally with expiratory wheezes present.  No stridor. No rales.  CARDIOVASCULAR: Regular, positive S1-S2, no murmurs rubs or gallops appreciated. ABDOMINAL: Obese, soft, nontender, nondistended, positive bowel sounds in all 4 quadrants, no apparent ascites.  EXTREMITIES: +1 bilateral peripheral edema, compression stocks present, +2 DP and +1 PT bilateral. HEMATOLOGIC: No significant bruising NEUROLOGIC: Oriented to person, place, and time. Nonfocal. Normal muscle tone.  PSYCHIATRIC: Normal mood and affect. Normal behavior. Cooperative  CARDIAC DATABASE: EKG: 07/02/2021: Normal sinus rhythm, 62 bpm, left axis, left anterior fascicular block, right bundle branch block, second-degree type II AV block, without underlying injury pattern.  07/02/2021: Sinus bradycardia, 54 bpm, right bundle branch block, left axis, left anterior fascicular block, second-degree type II AV block with ventricular escape beats.  07/18/2021: Atrial sensed ventricular paced rhythm, 108 bpm.  Echocardiogram: 07/03/2021: LVEF 60-65%, no regional wall motion abnormalities, mild LVH, grade 1 diastolic impairment, normal right ventricular size and function, mild MR, right atrial pressure 15 mmHg.  Stress Testing: No results found for this or any previous visit from the past 1095 days.  Heart catheterization: Left Heart Catheterization 07/03/21: No significant CAD.  Moderately elevated EDP. LV: 125 mmHg / 12 mmHg, EDP 22 mmHg.  Aortic pressure 109/46 with a mean of 69 mmHg.  There was no  pressure gradient across the aortic valve.  LVEDP moderately elevated.  LVEF 65%, no significant MR. LM: Large vessel, normal. LAD: Very large vessel, gives origin to 3 large diagonals, very mild disease is evident with mild calcification in the proximal segment. CX: Moderate sized vessel, smooth and normal. RCA: Very large vessel, PL branch is very large.  There is mild disease in the distal RCA.  Mild calcification is evident in the proximal segment. Recommendation: No reversible cause for heart block.  He will be scheduled for pacemaker implantation tomorrow.  Lower Extremity Arterial Duplex 08/10/2021:  Proximal to mid right SFA >50% stenosis with monophasic and dampened  waveform pattern. There is diffuse disease and monophasic waveform pattern in the right below knee small arteries.  No hemodynamically significant stenosis identified in the left lower extremity. There is diffuse plaque and severely abnormal monophasic waveform  in the small vessels below knee.  This exam reveals mildly decreased perfusion of the  right lower extremity, noted at the anterior tibial artery level (ABI  0.90) and moderately decreased perfusion of the left lower extremity at the post tibial artery level (ABI 0.71).   Abdominal Aortic Duplex 08/10/2021:  The maximum aorta (sac) diameter is 2.06 cm (prox). No AAA observed. No evidence of significant  atherosclerotic plaque. Normal flow velocities noted in bilateral common iliac arteries.   Dual-chamber pacemaker implantation: 07/04/2021:  1. Successful implantation of a Biotronik dual-chamber pacemaker for symptomatic bradycardia due to complete heart block  LABORATORY DATA: CBC Latest Ref Rng & Units 07/05/2021 07/04/2021 07/03/2021  WBC 4.0 - 10.5 K/uL 12.5(H) 14.6(H) 15.5(H)  Hemoglobin 13.0 - 17.0 g/dL 12.7(L) 12.7(L) 12.3(L)  Hematocrit 39.0 - 52.0 % 39.1 39.5 39.5  Platelets 150 - 400 K/uL 197 229 210    CMP Latest Ref Rng & Units 07/23/2021 07/05/2021  07/04/2021  Glucose 65 - 99 mg/dL 90 108(H) 106(H)  BUN 8 - 27 mg/dL 11 7(L) 13  Creatinine 0.76 - 1.27 mg/dL 0.69(L) 0.71 0.78  Sodium 134 - 144 mmol/L 138 136 137  Potassium 3.5 - 5.2 mmol/L 4.9 3.6 3.6  Chloride 96 - 106 mmol/L 102 103 105  CO2 20 - 29 mmol/L '23 26 23  '$ Calcium 8.6 - 10.2 mg/dL 9.1 8.6(L) 8.7(L)  Total Protein 6.5 - 8.1 g/dL - - 5.9(L)  Total Bilirubin 0.3 - 1.2 mg/dL - - 0.7  Alkaline Phos 38 - 126 U/L - - 79  AST 15 - 41 U/L - - 27  ALT 0 - 44 U/L - - 32    Lipid Panel  Lab Results  Component Value Date   CHOL 146 04/02/2021   HDL 35 (L) 04/02/2021   LDLCALC 86 04/02/2021   LDLDIRECT 85 07/22/2012   TRIG 140 04/02/2021   CHOLHDL 4.2 04/02/2021   No components found for: NTPROBNP Recent Labs    07/23/21 1235  PROBNP 116   Recent Labs    07/02/21 2013  TSH 1.306    BMP Recent Labs    07/03/21 0438 07/04/21 0222 07/05/21 0326 07/23/21 1157  NA 136 137 136 138  K 3.9 3.6 3.6 4.9  CL 106 105 103 102  CO2 20* '23 26 23  '$ GLUCOSE 88 106* 108* 90  BUN 15 13 7* 11  CREATININE 0.99 0.78 0.71 0.69*  CALCIUM 8.4* 8.7* 8.6* 9.1  GFRNONAA >60 >60 >60  --    HEMOGLOBIN A1C Lab Results  Component Value Date   HGBA1C 6.1 08/04/2017    IMPRESSION:    ICD-10-CM   1. PAD (peripheral artery disease) (HCC)  I73.9     2. Chronic heart failure with preserved ejection fraction (HCC)  I50.32 metoprolol succinate (TOPROL XL) 100 MG 24 hr tablet    sacubitril-valsartan (ENTRESTO) 49-51 MG    Basic metabolic panel    Magnesium    3. History of complete heart block  Z86.79     4. Symptomatic bradycardia  R00.1     5. Second degree AV block, Mobitz type II  I44.1     6. RBBB (right bundle branch block with left anterior fascicular block)  I45.2     7. Cardiac pacemaker in situ  Z95.0     8. Benign hypertension  I10     9. Hyperlipidemia, unspecified hyperlipidemia type  E78.5     10. Tobacco abuse  Z72.0        RECOMMENDATIONS: George Leon is a 65 y.o. male whose past medical history  and cardiac risk factors include: Status post dual-chamber pacemaker for symptomatic bradycardia and complete heart block, HFpEF, hypertension, peripheral vascular disease (per patient), hyperlipidemia, obesity due to excess calories, tobacco use, COPD.  Peripheral artery disease: Denies claudication. Abnormal ABIs bilaterally suggestive of decreased perfusion.  Arterial duplex notes disease in the proximal/mid right SFA. Start aspirin 81 mg p.o. daily. Continue statin therapy. Patient has stopped smoking for the last 1 week and is currently on nicotine patch.  He is congratulated on his efforts Patient is encouraged to increase physical activity with a goal of 30 minutes a day 5 days a week of moderate intensity exercise as tolerated. Recommend improving his modifiable cardiovascular risk factors. We did discuss peripheral angiogram at today's office visit; however, since he does not have any symptoms of claudication we will hold off for now.  Chronic heart failure with preserved EF, stage C, NYHA class II: Euvolemic. Shortness of breath improving compared to last visit. Medications reconciled. Discontinue losartan. Start Entresto 49/51 mg p.o. twice daily. Blood work in 1 week to evaluate kidney function and electrolytes. Patient assistance form filled for Jardiance. Low-salt diet. Strict I's and O's and daily weights.  Status post dual-chamber pacemaker due to symptomatic bradycardia and complete heart block: Currently follows up with Dr. Tanna Furry office -has appointments until February 2023. Will monitor peripherally  Tobacco abuse  Patient has stopped smoking for the last 1 week and is currently on nicotine patch. He is congratulated on his efforts.  FINAL MEDICATION LIST END OF ENCOUNTER: Meds ordered this encounter  Medications   metoprolol succinate (TOPROL XL) 100 MG 24 hr tablet    Sig: Take 1 tablet (100 mg total) by  mouth every morning. Take with or immediately following a meal.    Dispense:  90 tablet    Refill:  0   sacubitril-valsartan (ENTRESTO) 49-51 MG    Sig: Take 1 tablet by mouth 2 (two) times daily.    Dispense:  180 tablet    Refill:  0     Medications Discontinued During This Encounter  Medication Reason   losartan (COZAAR) 100 MG tablet Change in therapy   metoprolol succinate (TOPROL XL) 50 MG 24 hr tablet     Current Outpatient Medications:    acetaminophen (TYLENOL) 325 MG tablet, Take 2 tablets (650 mg total) by mouth every 4 (four) hours as needed for headache or mild pain., Disp: , Rfl:    albuterol (PROAIR HFA) 108 (90 Base) MCG/ACT inhaler, Inhale 1-2 puffs into the lungs every 4 (four) hours as needed. As needed for shortness of breath /wheeze/cough, Disp: 18 g, Rfl: 1   amLODipine (NORVASC) 5 MG tablet, TAKE 1 TABLET DAILY, Disp: 90 tablet, Rfl: 3   atorvastatin (LIPITOR) 80 MG tablet, TAKE 1 TABLET DAILY, Disp: 90 tablet, Rfl: 3   DULoxetine (CYMBALTA) 60 MG capsule, TAKE 1 CAPSULE DAILY, Disp: 90 capsule, Rfl: 3   Elastic Bandages & Supports (MEDICAL COMPRESSION STOCKINGS) MISC, 1 each by Does not apply route once., Disp: 2 each, Rfl: 0   empagliflozin (JARDIANCE) 10 MG TABS tablet, Take 1 tablet (10 mg total) by mouth daily before breakfast., Disp: 90 tablet, Rfl: 0   esomeprazole (NEXIUM) 40 MG capsule, TAKE 1 CAPSULE DAILY AT 12 NOON, Disp: 90 capsule, Rfl: 3   ezetimibe (ZETIA) 10 MG tablet, Take 1 tablet (10 mg total) by mouth daily., Disp: 90 tablet, Rfl: 3   HYDROcodone-acetaminophen (NORCO) 7.5-325 MG tablet, Take 1 tablet by mouth every 6 (six)  hours as needed for moderate pain., Disp: 120 tablet, Rfl: 0   INCRUSE ELLIPTA 62.5 MCG/INH AEPB, USE 1 INHALATION DAILY, Disp: 90 each, Rfl: 3   Multiple Vitamins-Minerals (OCUVITE ADULT FORMULA) CAPS, Take 1 capsule by mouth daily., Disp: 90 capsule, Rfl: 3   naloxone (NARCAN) 0.4 MG/ML injection, Use as needed for signs of  overdose., Disp: 1 mL, Rfl: 0   nicotine ([employer] NICOTINE) 21 mg/24hr patch, PLACE 1 PATCH ONTO SKIN DAILY, Disp: 42 patch, Rfl: 2   nystatin cream (MYCOSTATIN), APPLY TOPICALLY TWICE A DAY, Disp: 30 g, Rfl: 23   sacubitril-valsartan (ENTRESTO) 49-51 MG, Take 1 tablet by mouth 2 (two) times daily., Disp: 180 tablet, Rfl: 0   selenium sulfide (SELSUN) 2.5 % shampoo, Apply 1 application topically daily as needed for irritation., Disp: 118 mL, Rfl: 2   sodium chloride (OCEAN) 0.65 % SOLN nasal spray, Place 1 spray into both nostrils as needed for congestion., Disp: , Rfl:    metoprolol succinate (TOPROL XL) 100 MG 24 hr tablet, Take 1 tablet (100 mg total) by mouth every morning. Take with or immediately following a meal., Disp: 90 tablet, Rfl: 0  Orders Placed This Encounter  Procedures   Basic metabolic panel   Magnesium    There are no Patient Instructions on file for this visit.   --Continue cardiac medications as reconciled in final medication list. --Return in about 3 months (around 11/19/2021) for Follow up, heart failure management, PAD. Or sooner if needed. --Continue follow-up with your primary care physician regarding the management of your other chronic comorbid conditions.  Patient's questions and concerns were addressed to his satisfaction. He voices understanding of the instructions provided during this encounter.   This note was created using a voice recognition software as a result there may be grammatical errors inadvertently enclosed that do not reflect the nature of this encounter. Every attempt is made to correct such errors.  George Leon, [place], [facility]  Pager: [phone] Office: [phone]

## 2021-08-21 MED ORDER — HYDROCODONE-ACETAMINOPHEN 7.5-325 MG PO TABS: 1.0000 | ORAL_TABLET | Freq: Four times a day (QID) | ORAL | 0 refills | Status: DC | PRN

## 2021-08-24 ENCOUNTER — Other Ambulatory Visit: Payer: Self-pay

## 2021-08-24 DIAGNOSIS — I5032 Chronic diastolic (congestive) heart failure: Secondary | ICD-10-CM

## 2021-08-24 NOTE — Telephone Encounter (Signed)
From patient.

## 2021-09-03 DIAGNOSIS — I5032 Chronic diastolic (congestive) heart failure: Secondary | ICD-10-CM | POA: Diagnosis not present

## 2021-09-04 LAB — BASIC METABOLIC PANEL
BUN/Creatinine Ratio: 10 (ref 10–24)
BUN: 9 mg/dL (ref 8–27)
CO2: 21 mmol/L (ref 20–29)
Calcium: 9 mg/dL (ref 8.6–10.2)
Chloride: 103 mmol/L (ref 96–106)
Creatinine, Ser: 0.92 mg/dL (ref 0.76–1.27)
Glucose: 98 mg/dL (ref 70–99)
Potassium: 4.7 mmol/L (ref 3.5–5.2)
Sodium: 142 mmol/L (ref 134–144)
eGFR: 93 mL/min/{1.73_m2} (ref >59)

## 2021-09-04 LAB — MAGNESIUM: Magnesium: 2.4 mg/dL — ABNORMAL HIGH (ref 1.6–2.3)

## 2021-09-17 ENCOUNTER — Other Ambulatory Visit: Payer: Self-pay | Admitting: Student

## 2021-09-17 ENCOUNTER — Encounter: Payer: Self-pay | Admitting: Student

## 2021-09-17 DIAGNOSIS — G894 Chronic pain syndrome: Secondary | ICD-10-CM

## 2021-09-17 DIAGNOSIS — G8929 Other chronic pain: Secondary | ICD-10-CM

## 2021-09-17 MED ORDER — HYDROCODONE-ACETAMINOPHEN 7.5-325 MG PO TABS: 1.0000 | ORAL_TABLET | Freq: Four times a day (QID) | ORAL | 0 refills | Status: DC | PRN

## 2021-09-19 ENCOUNTER — Other Ambulatory Visit: Payer: Self-pay | Admitting: *Deleted

## 2021-09-19 DIAGNOSIS — Z87891 Personal history of nicotine dependence: Secondary | ICD-10-CM

## 2021-09-19 MED ORDER — NICOTINE 21 MG/24HR TD PT24: MEDICATED_PATCH | TRANSDERMAL | 2 refills | Status: DC

## 2021-09-20 ENCOUNTER — Other Ambulatory Visit: Payer: Self-pay | Admitting: Student

## 2021-09-20 DIAGNOSIS — G8929 Other chronic pain: Secondary | ICD-10-CM

## 2021-09-20 DIAGNOSIS — M5442 Lumbago with sciatica, left side: Secondary | ICD-10-CM

## 2021-09-20 MED ORDER — DICLOFENAC SODIUM 1 % EX GEL: 2.0000 g | CUTANEOUS | Status: DC | PRN

## 2021-09-20 NOTE — Progress Notes (Signed)
Medication order. Patient report Voltaren gel provides relief for neuropathy on his foot. Put in an order for patient

## 2021-09-27 ENCOUNTER — Other Ambulatory Visit: Payer: Self-pay | Admitting: Family Medicine

## 2021-09-27 DIAGNOSIS — R238 Other skin changes: Secondary | ICD-10-CM

## 2021-09-27 DIAGNOSIS — K219 Gastro-esophageal reflux disease without esophagitis: Secondary | ICD-10-CM

## 2021-09-27 DIAGNOSIS — J449 Chronic obstructive pulmonary disease, unspecified: Secondary | ICD-10-CM

## 2021-10-01 ENCOUNTER — Encounter: Payer: Self-pay | Admitting: Family Medicine

## 2021-10-01 ENCOUNTER — Ambulatory Visit (INDEPENDENT_AMBULATORY_CARE_PROVIDER_SITE_OTHER): Payer: BC Managed Care – PPO

## 2021-10-01 ENCOUNTER — Other Ambulatory Visit: Payer: Self-pay

## 2021-10-01 ENCOUNTER — Ambulatory Visit (INDEPENDENT_AMBULATORY_CARE_PROVIDER_SITE_OTHER): Payer: BC Managed Care – PPO | Admitting: Family Medicine

## 2021-10-01 VITALS — BP 110/50 | HR 74 | Ht 68.0 in | Wt 254.0 lb

## 2021-10-01 DIAGNOSIS — M1711 Unilateral primary osteoarthritis, right knee: Secondary | ICD-10-CM | POA: Diagnosis not present

## 2021-10-01 DIAGNOSIS — Z23 Encounter for immunization: Secondary | ICD-10-CM

## 2021-10-01 DIAGNOSIS — M170 Bilateral primary osteoarthritis of knee: Secondary | ICD-10-CM | POA: Diagnosis not present

## 2021-10-01 DIAGNOSIS — M159 Polyosteoarthritis, unspecified: Secondary | ICD-10-CM | POA: Diagnosis not present

## 2021-10-01 DIAGNOSIS — M1712 Unilateral primary osteoarthritis, left knee: Secondary | ICD-10-CM

## 2021-10-01 MED ORDER — METHYLPREDNISOLONE ACETATE 40 MG/ML IJ SUSP
40.0000 mg | Freq: Once | INTRAMUSCULAR | Status: AC
  Administered 2021-10-01: 40 mg via INTRAMUSCULAR

## 2021-10-01 MED ORDER — DICLOFENAC SODIUM 1 % EX GEL: CUTANEOUS | 0 refills | Status: DC

## 2021-10-01 NOTE — Progress Notes (Signed)
    SUBJECTIVE:   CHIEF COMPLAINT / HPI:   Knee Injections Patient presents for bilateral knee injections. Reports he has had longstanding arthritis in his knees for at least 10 years. 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 [place] which he knows will be difficult on his knees. Requests injections today. Has been using voltaren gel intermittently which provides some relief.  Health Maintenance Would like flu vaccine and COVID booster today  PERTINENT  PMH / PSH: HTN, PVD, HFpEF, CHF, chronic pain  OBJECTIVE:   BP (!) 110/50   Pulse 74   Ht 5\' 8"  (1.727 m)   Wt 254 lb (115.2 kg)   SpO2 94%   BMI 38.62 kg/m   General: NAD, pleasant, able to participate in exam Respiratory: No respiratory distress Skin: warm and dry, no rashes noted Psych: Normal affect and mood Neuro: grossly intact MSK: Minimal tenderness over medial anterior joint line of bilateral knees. Normal ROM of bilateral knees. Mildly antalgic gait.  ASSESSMENT/PLAN:   Osteoarthritis of multiple joints -Rx sent for voltaren gel as needed -Bilateral knee injections performed today  Health Maintenance -Flu vaccine and COVID booster given today  PROCEDURE: LEFT KNEE INJECTION: Patient was given informed consent, signed copy in the chart. Appropriate time out was taken. Area prepped in usual sterile fashion. Ethyl chloride was  used for local anesthesia. A 21 gauge 1 1/2 inch needle was used. 1 cc of methylprednisolone 40 mg/ml plus 3 cc of 1% lidocaine without epinephrine was injected into the left knee using a(n) anteromedial approach.   PROCEDURE: RIGHT KNEE INJECTION: Patient was given informed consent, signed copy in the chart. Appropriate time out was taken. Area prepped in usual sterile fashion. Ethyl chloride was  used for local anesthesia. A 21 gauge 1 1/2 inch  needle was used. 1 cc of methylprednisolone 40 mg/ml plus 3 cc of 1% lidocaine without epinephrine was injected into the right knee using a(n) anteromedial approach.   The patient tolerated the procedure well. There were no complications.    Alcus Dad, MD [place]

## 2021-10-01 NOTE — Patient Instructions (Addendum)
It was great to meet you!  Today we did steroid injections in both knees. People often feel better immediately after the procedure due to numbing medication, but there's a chance you could have increased pain/soreness over the next 24 hours. If this is the case it's ok to use Voltaren gel or take Tylenol/Ibuprofen.   I'm glad you got the flu shot and COVID booster today. You may experience some arm soreness, fatigue, or low grade fever for the next 24-48 hrs. Again, you can take Tylenol or Ibuprofen if needed.  Take care and seek immediate care sooner if you develop any concerns.  Dr. [facility]

## 2021-10-02 DIAGNOSIS — Z45018 Encounter for adjustment and management of other part of cardiac pacemaker: Secondary | ICD-10-CM | POA: Diagnosis not present

## 2021-10-02 DIAGNOSIS — I442 Atrioventricular block, complete: Secondary | ICD-10-CM | POA: Diagnosis not present

## 2021-10-03 ENCOUNTER — Encounter: Payer: Self-pay | Admitting: Cardiology

## 2021-10-03 ENCOUNTER — Other Ambulatory Visit: Payer: Self-pay

## 2021-10-03 DIAGNOSIS — I5032 Chronic diastolic (congestive) heart failure: Secondary | ICD-10-CM

## 2021-10-03 MED ORDER — METOPROLOL SUCCINATE ER 100 MG PO TB24: 100.0000 mg | ORAL_TABLET | Freq: Every morning | ORAL | 0 refills | Status: DC

## 2021-10-03 NOTE — Assessment & Plan Note (Signed)
-  Rx sent for voltaren gel as needed -Bilateral knee injections performed today

## 2021-10-09 ENCOUNTER — Encounter: Payer: BC Managed Care – PPO | Admitting: Internal Medicine

## 2021-10-11 ENCOUNTER — Other Ambulatory Visit: Payer: Self-pay | Admitting: Family Medicine

## 2021-10-11 DIAGNOSIS — G8929 Other chronic pain: Secondary | ICD-10-CM

## 2021-10-11 DIAGNOSIS — E785 Hyperlipidemia, unspecified: Secondary | ICD-10-CM

## 2021-10-11 DIAGNOSIS — I10 Essential (primary) hypertension: Secondary | ICD-10-CM

## 2021-10-11 DIAGNOSIS — G894 Chronic pain syndrome: Secondary | ICD-10-CM

## 2021-10-16 ENCOUNTER — Other Ambulatory Visit: Payer: Self-pay

## 2021-10-16 DIAGNOSIS — I5032 Chronic diastolic (congestive) heart failure: Secondary | ICD-10-CM

## 2021-10-16 MED ORDER — EMPAGLIFLOZIN 10 MG PO TABS: 10.0000 mg | ORAL_TABLET | Freq: Every day | ORAL | 0 refills | Status: AC

## 2021-10-17 ENCOUNTER — Other Ambulatory Visit: Payer: Self-pay | Admitting: Student

## 2021-10-17 ENCOUNTER — Other Ambulatory Visit: Payer: Self-pay

## 2021-10-17 DIAGNOSIS — I5032 Chronic diastolic (congestive) heart failure: Secondary | ICD-10-CM

## 2021-10-17 DIAGNOSIS — G8929 Other chronic pain: Secondary | ICD-10-CM

## 2021-10-17 DIAGNOSIS — G894 Chronic pain syndrome: Secondary | ICD-10-CM

## 2021-10-17 MED ORDER — METOPROLOL SUCCINATE ER 100 MG PO TB24: 100.0000 mg | ORAL_TABLET | Freq: Every morning | ORAL | 0 refills | Status: DC

## 2021-10-17 MED ORDER — HYDROCODONE-ACETAMINOPHEN 7.5-325 MG PO TABS: 1.0000 | ORAL_TABLET | Freq: Four times a day (QID) | ORAL | 0 refills | Status: DC | PRN

## 2021-10-31 ENCOUNTER — Other Ambulatory Visit: Payer: Self-pay | Admitting: Cardiology

## 2021-10-31 DIAGNOSIS — I5032 Chronic diastolic (congestive) heart failure: Secondary | ICD-10-CM

## 2021-11-14 ENCOUNTER — Other Ambulatory Visit: Payer: Self-pay | Admitting: Student

## 2021-11-14 DIAGNOSIS — M5442 Lumbago with sciatica, left side: Secondary | ICD-10-CM

## 2021-11-14 DIAGNOSIS — G894 Chronic pain syndrome: Secondary | ICD-10-CM

## 2021-11-14 DIAGNOSIS — G8929 Other chronic pain: Secondary | ICD-10-CM

## 2021-11-14 MED ORDER — HYDROCODONE-ACETAMINOPHEN 7.5-325 MG PO TABS: 1.0000 | ORAL_TABLET | Freq: Four times a day (QID) | ORAL | 0 refills | Status: DC | PRN

## 2021-11-14 NOTE — Progress Notes (Signed)
Encounter error

## 2021-11-15 ENCOUNTER — Encounter: Payer: Self-pay | Admitting: Student

## 2021-11-19 ENCOUNTER — Ambulatory Visit: Payer: BC Managed Care – PPO | Admitting: Cardiology

## 2021-12-06 ENCOUNTER — Ambulatory Visit (INDEPENDENT_AMBULATORY_CARE_PROVIDER_SITE_OTHER): Payer: BC Managed Care – PPO | Admitting: Student

## 2021-12-06 ENCOUNTER — Other Ambulatory Visit: Payer: Self-pay

## 2021-12-06 ENCOUNTER — Encounter: Payer: Self-pay | Admitting: Student

## 2021-12-06 VITALS — BP 125/82 | HR 74 | Ht 68.0 in | Wt 240.2 lb

## 2021-12-06 DIAGNOSIS — K219 Gastro-esophageal reflux disease without esophagitis: Secondary | ICD-10-CM

## 2021-12-06 DIAGNOSIS — F1190 Opioid use, unspecified, uncomplicated: Secondary | ICD-10-CM

## 2021-12-06 NOTE — Progress Notes (Signed)
° ° °  SUBJECTIVE:   CHIEF COMPLAINT / HPI:   Mr. Estaban Mainville is a 65 year old male who presents today for follow-up visit and medication refill. He has hx of complete heart block s/p pacemaker. He sees Dr. Terri Skains as his cardiologist and next appointment is scheduled for February,2023.  Patient report he is doing well overall other than intermittent substernal chest pain. He denies any radiation or worsening pain with exertion. Pain comes on when he is upset or anxious. He reports having history of GERD, esophageal ulcers and duodenal hernia.   PERTINENT  PMH / PSH:  Complete Heart Block s/p Pacemaker HTN GERD Osteoarthritis of multiple joints Chronic back pain  OBJECTIVE:   BP 125/82    Pulse 74    Ht 5\' 8"  (1.727 m)    Wt 240 lb 3.2 oz (109 kg)    SpO2 98%    BMI 36.52 kg/m    Physical Exam General: Alert, well appearing, NAD Cardiovascular: RRR, No Murmurs, Normal S2/S2 Respiratory: CTAB, No wheezing or Rales Abdomen: No distension or tenderness Extremities: No edema on extremities     ASSESSMENT/PLAN:   Chest pain, atypical Intermittent non-radiating substernal chest pain that's worse with emotion of anxiousness and upset. Pain is unchanged with exertion. Denies palpitation. No chest pain at this time. Pulse is usually in the 70s. He has intermittent heart burn. Given his history of GERD, reported history of duodenal hernia or esophageal ulcer, there is stronger indication for GI etiology vs cardiac. Will follow up in 4 weeks and if pain continues or progresses will recommended earlier appointment with cardiology. Outline signs and symptoms for prompt return or ED visit and patient verbalized understanding.       Alen Bleacher, MD [facility]

## 2021-12-06 NOTE — Patient Instructions (Signed)
It was wonderful to see you today. Thank you for allowing me to be a part of your care. Below is a short summary of what we discussed at your visit today:  Follow-up in a months.  Please bring all of your medications to every appointment!  If you have any questions or concerns, please do not hesitate to contact us via phone or MyChart message.   Alen Bleacher, MD [facility]

## 2021-12-16 ENCOUNTER — Encounter: Payer: Self-pay | Admitting: Student

## 2021-12-16 DIAGNOSIS — G8929 Other chronic pain: Secondary | ICD-10-CM

## 2021-12-16 DIAGNOSIS — G894 Chronic pain syndrome: Secondary | ICD-10-CM

## 2021-12-18 MED ORDER — HYDROCODONE-ACETAMINOPHEN 7.5-325 MG PO TABS: 1.0000 | ORAL_TABLET | Freq: Four times a day (QID) | ORAL | 0 refills | Status: DC | PRN

## 2021-12-20 ENCOUNTER — Telehealth: Payer: Self-pay

## 2021-12-20 ENCOUNTER — Other Ambulatory Visit: Payer: Self-pay | Admitting: Student

## 2021-12-20 DIAGNOSIS — G8929 Other chronic pain: Secondary | ICD-10-CM

## 2021-12-20 DIAGNOSIS — G894 Chronic pain syndrome: Secondary | ICD-10-CM

## 2021-12-20 DIAGNOSIS — M5442 Lumbago with sciatica, left side: Secondary | ICD-10-CM

## 2021-12-20 MED ORDER — HYDROCODONE-ACETAMINOPHEN 7.5-325 MG PO TABS: 1.0000 | ORAL_TABLET | Freq: Four times a day (QID) | ORAL | 0 refills | Status: DC | PRN

## 2021-12-20 NOTE — Telephone Encounter (Signed)
Patient calls nurse line reporting difficulties picking up Hydrocodone. [facility] [address] [facility] [address] [facility] and [facility] to [facility] prescription [facility]   I have [facility] [address] and cancelled out prescription.   [facility] [address].

## 2021-12-20 NOTE — Progress Notes (Signed)
[facility] reported [facility] [address] [facility] and [facility] [address]. [facility] [address].

## 2022-01-01 DIAGNOSIS — Z45018 Encounter for adjustment and management of other part of cardiac pacemaker: Secondary | ICD-10-CM | POA: Diagnosis not present

## 2022-01-01 DIAGNOSIS — I442 Atrioventricular block, complete: Secondary | ICD-10-CM | POA: Diagnosis not present

## 2022-01-17 ENCOUNTER — Other Ambulatory Visit: Payer: Self-pay | Admitting: Student

## 2022-01-17 DIAGNOSIS — G894 Chronic pain syndrome: Secondary | ICD-10-CM

## 2022-01-17 DIAGNOSIS — G8929 Other chronic pain: Secondary | ICD-10-CM

## 2022-01-18 MED ORDER — HYDROCODONE-ACETAMINOPHEN 7.5-325 MG PO TABS: 1.0000 | ORAL_TABLET | Freq: Four times a day (QID) | ORAL | 0 refills | Status: DC | PRN

## 2022-01-31 ENCOUNTER — Other Ambulatory Visit: Payer: Self-pay

## 2022-01-31 ENCOUNTER — Ambulatory Visit (INDEPENDENT_AMBULATORY_CARE_PROVIDER_SITE_OTHER): Payer: BC Managed Care – PPO | Admitting: Student

## 2022-01-31 ENCOUNTER — Encounter: Payer: Self-pay | Admitting: Family Medicine

## 2022-01-31 VITALS — BP 104/60 | HR 82 | Ht 68.0 in | Wt 245.5 lb

## 2022-01-31 DIAGNOSIS — K219 Gastro-esophageal reflux disease without esophagitis: Secondary | ICD-10-CM

## 2022-01-31 NOTE — Progress Notes (Addendum)
° ° °  SUBJECTIVE:   CHIEF COMPLAINT / HPI: Chest pain Follow up  Patient present today for follow-up after he was recently seen for chief complaint of chest pain that was suspicious for possible GERD exacerbation.  Patient reports he still have these substernal mild chest pains that are worse with feeling of anxiousness.  Pain has remained unchanged since the last visit. He reports he was very previously been told by a physician he is go about having a dull hernia.  Patient endorses occasional swellings in the epigastric area and also some feelings of food getting stuck with solid meals.  Again has been okay and has denies any blood in stool or bloody emesis recently.    PERTINENT  PMH / PSH: T2DM, HTN, GERD, Complete heart block  OBJECTIVE:   BP 104/60    Pulse 82    Ht 5\' 8"  (1.727 m)    Wt 245 lb 8 oz (111.4 kg)    SpO2 94%    BMI 37.33 kg/m     Physical Exam General: Alert, well appearing, NAD Cardiovascular: RRR, No Murmurs, Normal S2/S2 Respiratory: CTAB, No wheezing or Rales Abdomen: No distension or tenderness.  Area of hardness central at the epigastric region Extremities: No edema on extremities   Skin: Warm and dry  ASSESSMENT/PLAN:   GERD Patient with long standing history of GERD is complaining of substernal chest pain that is non radiating and not affected by exertion. He is currently on esomeprazole 40mg  daily and endorses compliance. He reports occasional feeling of food getting stuck around the epigastric area. On exam found to have area of hardness at the epigastric region which is non tender. His presentation is concerning for esophageal dysphasia and given his long standing history of GERD some differentials to consider include esophagitis, Barrett esophagus and stricture.  -Referring patient to gastroenterologist for further evaluation.  -Follow up in 1 month      Alen Bleacher, MD [place]

## 2022-01-31 NOTE — Patient Instructions (Signed)
It was wonderful to see you today. Thank you for allowing me to be a part of your care. Below is a short summary of what we discussed at your visit today:  Chest pain is most likely related to history of GERD  We will send a referral to a gastroenterologist for further assessment of dysphagia and food getting stuck after feeding.  Please follow-up with your cardiologist  Please bring all of your medications to every appointment!  If you have any questions or concerns, please do not hesitate to contact us via phone or MyChart message.   Alen Bleacher, MD [facility]

## 2022-01-31 NOTE — Assessment & Plan Note (Addendum)
Patient with long standing history of GERD is complaining of substernal chest pain that is non radiating and not affected by exertion. He is currently on esomeprazole 40mg  daily and endorses compliance. He reports occasional feeling of food getting stuck around the epigastric area. On exam found to have area of hardness at the epigastric region which is non tender. His presentation is concerning for esophageal dysphasia and given his long standing history of GERD some differentials to consider include esophagitis, Barrett esophagus and stricture.  -Referring patient to gastroenterologist for further evaluation.  -Follow up in 1 month

## 2022-02-14 ENCOUNTER — Encounter: Payer: Self-pay | Admitting: Student

## 2022-02-14 ENCOUNTER — Other Ambulatory Visit: Payer: Self-pay

## 2022-02-14 ENCOUNTER — Other Ambulatory Visit: Payer: Self-pay | Admitting: Student

## 2022-02-14 ENCOUNTER — Encounter: Payer: Self-pay | Admitting: Cardiology

## 2022-02-14 DIAGNOSIS — E785 Hyperlipidemia, unspecified: Secondary | ICD-10-CM

## 2022-02-14 DIAGNOSIS — G8929 Other chronic pain: Secondary | ICD-10-CM

## 2022-02-14 DIAGNOSIS — G894 Chronic pain syndrome: Secondary | ICD-10-CM

## 2022-02-14 MED ORDER — HYDROCODONE-ACETAMINOPHEN 7.5-325 MG PO TABS: 1.0000 | ORAL_TABLET | Freq: Four times a day (QID) | ORAL | 0 refills | Status: DC | PRN

## 2022-02-14 MED ORDER — EZETIMIBE 10 MG PO TABS: 10.0000 mg | ORAL_TABLET | Freq: Every day | ORAL | 3 refills | Status: DC

## 2022-02-18 ENCOUNTER — Encounter: Payer: Self-pay | Admitting: Gastroenterology

## 2022-02-26 ENCOUNTER — Ambulatory Visit (INDEPENDENT_AMBULATORY_CARE_PROVIDER_SITE_OTHER): Payer: BC Managed Care – PPO | Admitting: Gastroenterology

## 2022-02-26 ENCOUNTER — Encounter: Payer: Self-pay | Admitting: Gastroenterology

## 2022-02-26 VITALS — BP 122/78 | HR 62 | Ht 66.0 in | Wt 251.1 lb

## 2022-02-26 DIAGNOSIS — K219 Gastro-esophageal reflux disease without esophagitis: Secondary | ICD-10-CM | POA: Diagnosis not present

## 2022-02-26 DIAGNOSIS — Z86010 Personal history of colonic polyps: Secondary | ICD-10-CM

## 2022-02-26 DIAGNOSIS — R1310 Dysphagia, unspecified: Secondary | ICD-10-CM | POA: Diagnosis not present

## 2022-02-26 MED ORDER — NA SULFATE-K SULFATE-MG SULF 17.5-3.13-1.6 GM/177ML PO SOLN: 1.0000 | Freq: Once | ORAL | 0 refills | Status: AC

## 2022-02-26 NOTE — Progress Notes (Signed)
History of Present Illness: This is a 66 year old male referred by Moses Manners, MD and Barbaraann Cao MD for the evaluation of GERD, dysphagia, lower sternal / epigastric bulge.  He is accompanied by his son.  This patient has been followed by Dr. Jeani Hawking and underwent colonoscopy in January 2020 with 7 tubular adenomas removed, one measuring 18 mm and the rest less than 10 mm.  He was recommended to undergo repeat colonoscopy in 1 year with Dr. Elnoria Howard but this was not accomplished.  Patient relates intermittent solid food dysphagia since puberty.  The frequency and severity of these symptoms have not changed.  He notes a slight bulge at his lower sternum and epigastrium.  He has chronic reflux symptoms and symptoms and they are well controlled on Nexium daily. It is not clear why he was not referred to his established gastroenterologist, Dr. Elnoria Howard for his current GI symptoms.  Denies weight loss, abdominal pain, constipation, diarrhea, change in stool caliber, melena, hematochezia, nausea, vomiting, chest pain.    Allergies  Allergen Reactions   Adhesive [Tape] Rash    Paper tape is what bothers him Pulled skin off also   Hydrochlorothiazide Other (See Comments)    Back Pain & gives him kidney stones   Sulfamethoxazole Swelling and Other (See Comments)    Tongue Swelling. Pt states he has had a sulfa drug since and has tolerated them.   Outpatient Medications Prior to Visit  Medication Sig Dispense Refill   acetaminophen (TYLENOL) 325 MG tablet Take 2 tablets (650 mg total) by mouth every 4 (four) hours as needed for headache or mild pain.     albuterol (VENTOLIN HFA) 108 (90 Base) MCG/ACT inhaler USE 1 TO 2 INHALATIONS EVERY 4 HOURS AS NEEDED FOR SHORTNESS OF BREATH/WHEEZE/COUGH. 17 g 4   amLODipine (NORVASC) 5 MG tablet TAKE 1 TABLET DAILY 90 tablet 3   atorvastatin (LIPITOR) 80 MG tablet TAKE 1 TABLET DAILY 90 tablet 3   DULoxetine (CYMBALTA) 60 MG capsule TAKE 1 CAPSULE DAILY 90  capsule 3   Elastic Bandages & Supports (MEDICAL COMPRESSION STOCKINGS) MISC 1 each by Does not apply route once. 2 each 0   ENTRESTO 49-51 MG TAKE 1 TABLET TWICE A DAY 180 tablet 0   esomeprazole (NEXIUM) 40 MG capsule TAKE 1 CAPSULE DAILY AT 12 NOON 90 capsule 3   ezetimibe (ZETIA) 10 MG tablet Take 1 tablet (10 mg total) by mouth daily. 90 tablet 3   HYDROcodone-acetaminophen (NORCO) 7.5-325 MG tablet Take 1 tablet by mouth every 6 (six) hours as needed for moderate pain. 120 tablet 0   INCRUSE ELLIPTA 62.5 MCG/INH AEPB USE 1 INHALATION DAILY 90 each 3   Multiple Vitamins-Minerals (OCUVITE ADULT FORMULA) CAPS Take 1 capsule by mouth daily. 90 capsule 3   naloxone (NARCAN) 0.4 MG/ML injection Use as needed for signs of overdose. 1 mL 0   nicotine ([facility] NICOTINE) 21 mg/24hr patch PLACE 1 PATCH ONTO SKIN DAILY 42 patch 2   nystatin cream (MYCOSTATIN) APPLY TOPICALLY TWICE A DAY 30 g 23   selenium sulfide (SELSUN) 2.5 % shampoo APPLY 1 APPLICATION TOPICALLY DAILY AS NEEDED FOR IRRITATION 120 mL 3   sodium chloride (OCEAN) 0.65 % SOLN nasal spray Place 1 spray into both nostrils as needed for congestion.     metoprolol succinate (TOPROL XL) 100 MG 24 hr tablet Take 1 tablet (100 mg total) by mouth every morning. Take with or immediately following a meal. 90 tablet  0   Facility-Administered Medications Prior to Visit  Medication Dose Route Frequency Provider Last Rate Last Admin   diclofenac Sodium (VOLTAREN) 1 % topical gel 2 g  2 g Topical PRN Jerre Simon, MD       Past Medical History:  Diagnosis Date   Acute MI [facility])    Acute on chronic diastolic heart failure (HCC) 12/01/2015   Anxiety    Arthritis    "qwhere" (08/06/2017)   CHF (congestive heart failure) (HCC) dx'd 2016   Chronic back pain    "all my back" (08/06/2017)   Chronic bronchitis (HCC)    Chronic pain    Chronic pain syndrome 12/01/2015   Complete heart block (HCC)    COPD (chronic obstructive pulmonary disease) (HCC)     Dyspnea    w/ exertion    Dyspnea on exertion    Emphysema of lung (HCC)    Encounter for care of pacemaker 07/04/2021   High cholesterol    High degree atrioventricular block    History of hiatal hernia    possible     Hypertension    Liver fatty degeneration    Macular degeneration, right eye    Pacemaker Biotronik [place] dual chamber transmission  07/04/2021   Pacemaker Dual chamber Biotronik [place] 8dr-T Mri - [id number] pacemaker 07/04/2021 07/04/2021   Pneumonia  1990s X 1; 2016   Past Surgical History:  Procedure Laterality Date   ANTERIOR CERVICAL DECOMP/DISCECTOMY FUSION  1988   "plate; cadavar bone; couple bolts"   BACK SURGERY     CARDIAC CATHETERIZATION     JOINT REPLACEMENT     KNEE ARTHROSCOPY Left 05/27/2017   Procedure: LEFT KNEE ARTHROSCOPY WITH PARTIAL MEDIAL MENISCECTOMY;  Surgeon: Kathryne Hitch, MD;  Location: [facility] OR;  Service: Orthopedics;  Laterality: Left;   LEFT HEART CATH AND CORONARY ANGIOGRAPHY N/A 07/03/2021   Procedure: LEFT HEART CATH AND CORONARY ANGIOGRAPHY;  Surgeon: Yates Decamp, MD;  Location: [facility] INVASIVE CV LAB;  Service: Cardiovascular;  Laterality: N/A;   PACEMAKER IMPLANT N/A 07/04/2021   Procedure: PACEMAKER IMPLANT;  Surgeon: Marinus Maw, MD;  Location: [facility] INVASIVE CV LAB;  Service: Cardiovascular;  Laterality: N/A;   SHOULDER ARTHROSCOPY WITH ROTATOR CUFF REPAIR Right    TOTAL HIP ARTHROPLASTY Left 08/05/2017   Procedure: LEFT TOTAL HIP ARTHROPLASTY ANTERIOR APPROACH;  Surgeon: Kathryne Hitch, MD;  Location: [facility] OR;  Service: Orthopedics;  Laterality: Left;   Social History   Socioeconomic History   Marital status: Married    Spouse name: Not on file   Number of children: Not on file   Years of education: Not on file   Highest education level: Not on file  Occupational History   Not on file  Tobacco Use   Smoking status: Former    Packs/day: 1.00    Years: 45.00    Pack years: 45.00    Types: Cigarettes   Smokeless  tobacco: Former    Types: Associate Professor Use: Never used  Substance and Sexual Activity   Alcohol use: Yes    Comment: 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   Drug use: Yes    Types: Marijuana    Comment: 08/06/2017 "last time was 07/2017; none in years before that"   Sexual activity: Never  Other Topics Concern   Not on file  Social History Narrative   Not on file   Social Determinants of Health   Financial Resource Strain:  Not on file  Food Insecurity: Not on file  Transportation Needs: Not on file  Physical Activity: Not on file  Stress: Not on file  Social Connections: Not on file   Family History  Problem Relation Age of Onset   Hypertension Father    AAA (abdominal aortic aneurysm) Father      Review of Systems: Pertinent positive and negative review of systems were noted in the above HPI section. All other review of systems were otherwise negative.   Physical Exam: General: Well developed, well nourished, no acute distress Head: Normocephalic and atraumatic Eyes: Sclerae anicteric, EOMI Ears: Normal auditory acuity Mouth: Not examined, mask on during Covid-19 pandemic Neck: Supple, no masses or thyromegaly Lungs: Clear throughout to auscultation Heart: Regular rate and rhythm; no murmurs, rubs or bruits Chest: Prominent xiphoid process corresponds to his reported buldge Abdomen: Soft, non tender and non distended. No masses, hepatosplenomegaly or hernias noted. Normal Bowel sounds Rectal: Deferred to colonoscopy musculoskeletal: Symmetrical with no gross deformities  Skin: No lesions on visible extremities Pulses:  Normal pulses noted Extremities: No clubbing, cyanosis, edema or deformities noted Neurological: Alert oriented x 4, grossly nonfocal Cervical Nodes:  No significant cervical adenopathy Inguinal Nodes: No significant inguinal adenopathy Psychological:  Alert and cooperative. Normal mood and affect   Assessment and  Recommendations:  GERD, longstanding nonprogressive solid food dysphagia.  Rule out esophagitis, Barrett's, esophageal stricture.  Follow antireflux measures and continue Nexium 40 mg daily.  Schedule EGD with possible dilation. The risks (including bleeding, perforation, infection, missed lesions, medication reactions and possible hospitalization or surgery if complications occur), benefits, and alternatives to endoscopy with possible biopsy and possible dilation were discussed with the patient and they consent to proceed.   Personal history of multiple adenomatous colon polyps on colonoscopy performed in January 2020 by Dr. Jeani Hawking. He is overdue for surveillance colonoscopy.  Schedule colonoscopy. The risks (including bleeding, perforation, infection, missed lesions, medication reactions and possible hospitalization or surgery if complications occur), benefits, and alternatives to colonoscopy with possible biopsy and possible polypectomy were discussed with the patient and they consent to proceed.   Prominent xiphoid process corresponds to his area of concern at his lower sternum and epigastrium.  Patient is reassured.   cc: Moses Manners, MD [address] [place],  [place] [address]

## 2022-02-26 NOTE — Patient Instructions (Signed)
You have been scheduled for an endoscopy and colonoscopy. Please follow the written instructions given to you at your visit today. Please pick up your prep supplies at the pharmacy within the next 1-3 days. If you use inhalers (even only as needed), please bring them with you on the day of your procedure.  Due to recent changes in healthcare laws, you may see the results of your imaging and laboratory studies on MyChart before your provider has had a chance to review them.  We understand that in some cases there may be results that are confusing or concerning to you. Not all laboratory results come back in the same time frame and the provider may be waiting for multiple results in order to interpret others.  Please give us 48 hours in order for your provider to thoroughly review all the results before contacting the office for clarification of your results.   The [facility] providers would like to encourage you to use MYCHART to communicate with providers for non-urgent requests or questions.  Due to long hold times on the telephone, sending your provider a message by MYCHART may be a faster and more efficient way to get a response.  Please allow 48 business hours for a response.  Please remember that this is for non-urgent requests.   Thank you for choosing me and [facility].  Malcolm T. Stark, Jr., MD., FACG  

## 2022-02-28 ENCOUNTER — Encounter: Payer: Self-pay | Admitting: Cardiology

## 2022-02-28 ENCOUNTER — Other Ambulatory Visit: Payer: Self-pay

## 2022-02-28 MED ORDER — EMPAGLIFLOZIN 10 MG PO TABS: 10.0000 mg | ORAL_TABLET | Freq: Every day | ORAL | 2 refills | Status: DC

## 2022-02-28 NOTE — Telephone Encounter (Signed)
Is pt suppose to be taking Jardiance? I do not see this on his medication list.

## 2022-02-28 NOTE — Telephone Encounter (Signed)
Yes, its on the medication list from last office note.  ? ?Dr. Terri Skains ?

## 2022-03-17 ENCOUNTER — Other Ambulatory Visit: Payer: Self-pay | Admitting: Student

## 2022-03-17 DIAGNOSIS — G8929 Other chronic pain: Secondary | ICD-10-CM

## 2022-03-17 DIAGNOSIS — G894 Chronic pain syndrome: Secondary | ICD-10-CM

## 2022-03-18 MED ORDER — HYDROCODONE-ACETAMINOPHEN 7.5-325 MG PO TABS: 1.0000 | ORAL_TABLET | Freq: Four times a day (QID) | ORAL | 0 refills | Status: DC | PRN

## 2022-03-19 ENCOUNTER — Telehealth: Payer: Self-pay | Admitting: *Deleted

## 2022-03-19 NOTE — Telephone Encounter (Signed)
While finishing this note the pt called back and said [facility] [address] and the pharmacy said they do have Oxycodone.  Pt stated that him and Dr. Adah Salvage has discussed this as an option previously.  [facility] [address] [facility].  If you have any questions please contact pt to clarify. He would like to pick up a Rx tomorrow if possible. Carmichael Burdette Zimmerman Rumple, CMA ? ?

## 2022-03-19 NOTE — Telephone Encounter (Signed)
Received fax from pharmacy stating that they are out of stock for the pts [name].  It is on back order and they are not sure when they will receive it, I cancelled this Rx.  I also called the [facility] and they have been out for months.  Contacted pt to inform him and he is going to call around to see if he can locate a pharmacy who has this but wanted to send message to PCP to see if there are other options for this pt and if so to please reach out to patient to inform him.  George Leon, CMA ?   ?

## 2022-03-20 ENCOUNTER — Other Ambulatory Visit: Payer: Self-pay | Admitting: Student

## 2022-03-20 ENCOUNTER — Encounter: Payer: Self-pay | Admitting: Student

## 2022-03-20 DIAGNOSIS — G894 Chronic pain syndrome: Secondary | ICD-10-CM

## 2022-03-20 DIAGNOSIS — G8929 Other chronic pain: Secondary | ICD-10-CM

## 2022-03-20 MED ORDER — OXYCODONE-ACETAMINOPHEN 5-325 MG PO TABS: 1.0000 | ORAL_TABLET | Freq: Four times a day (QID) | ORAL | 0 refills | Status: DC

## 2022-03-20 NOTE — Progress Notes (Signed)
Patient called to say the pharmacy is out of Norco 7.'5mg'$ . Rx Percocet '5mg'$  to [facility] [address] which patient reported have the [place] in stock.  ?

## 2022-03-21 ENCOUNTER — Other Ambulatory Visit: Payer: Self-pay

## 2022-03-21 ENCOUNTER — Other Ambulatory Visit (HOSPITAL_COMMUNITY): Payer: Self-pay

## 2022-03-21 ENCOUNTER — Encounter: Payer: Self-pay | Admitting: Student

## 2022-03-21 DIAGNOSIS — G894 Chronic pain syndrome: Secondary | ICD-10-CM

## 2022-03-21 MED ORDER — OXYCODONE-ACETAMINOPHEN 5-325 MG PO TABS
1.0000 | ORAL_TABLET | Freq: Four times a day (QID) | ORAL | 0 refills | Status: AC
  Filled 2022-03-21: qty 120, 30d supply, fill #0

## 2022-03-21 NOTE — Telephone Encounter (Signed)
Spoke to pt. He is starting to get uncomfortable. Withdrawal symptoms are starting. Ottis Stain, CMA ? ?

## 2022-03-21 NOTE — Telephone Encounter (Signed)
[facility] #120 Oxycodone-Acetaminophen. [facility]  ? ?I called [facility] and they have stock.  ? ?Please resend to Outpatient. ? ?The patient has been made aware.  ?

## 2022-03-22 ENCOUNTER — Other Ambulatory Visit (HOSPITAL_COMMUNITY): Payer: Self-pay

## 2022-03-22 NOTE — Telephone Encounter (Signed)
Spoke to pt. Medication is ready to be picked up at [facility]. Ottis Stain, CMA ? ?

## 2022-04-01 ENCOUNTER — Encounter: Payer: Self-pay | Admitting: Cardiology

## 2022-04-01 ENCOUNTER — Other Ambulatory Visit: Payer: Self-pay

## 2022-04-01 DIAGNOSIS — I5032 Chronic diastolic (congestive) heart failure: Secondary | ICD-10-CM

## 2022-04-01 MED ORDER — ENTRESTO 49-51 MG PO TABS: 1.0000 | ORAL_TABLET | Freq: Two times a day (BID) | ORAL | 0 refills | Status: DC

## 2022-04-02 DIAGNOSIS — I442 Atrioventricular block, complete: Secondary | ICD-10-CM | POA: Diagnosis not present

## 2022-04-02 DIAGNOSIS — Z45018 Encounter for adjustment and management of other part of cardiac pacemaker: Secondary | ICD-10-CM | POA: Diagnosis not present

## 2022-04-09 ENCOUNTER — Encounter: Payer: Self-pay | Admitting: Gastroenterology

## 2022-04-09 ENCOUNTER — Ambulatory Visit (AMBULATORY_SURGERY_CENTER): Payer: BC Managed Care – PPO | Admitting: Gastroenterology

## 2022-04-09 VITALS — BP 109/62 | HR 78 | Temp 98.9°F | Resp 13 | Ht 66.0 in | Wt 251.0 lb

## 2022-04-09 DIAGNOSIS — D125 Benign neoplasm of sigmoid colon: Secondary | ICD-10-CM

## 2022-04-09 DIAGNOSIS — Z860101 Personal history of adenomatous and serrated colon polyps: Secondary | ICD-10-CM

## 2022-04-09 DIAGNOSIS — D123 Benign neoplasm of transverse colon: Secondary | ICD-10-CM

## 2022-04-09 DIAGNOSIS — K219 Gastro-esophageal reflux disease without esophagitis: Secondary | ICD-10-CM | POA: Diagnosis not present

## 2022-04-09 DIAGNOSIS — D124 Benign neoplasm of descending colon: Secondary | ICD-10-CM

## 2022-04-09 DIAGNOSIS — D128 Benign neoplasm of rectum: Secondary | ICD-10-CM

## 2022-04-09 DIAGNOSIS — Z86010 Personal history of colonic polyps: Secondary | ICD-10-CM | POA: Diagnosis not present

## 2022-04-09 DIAGNOSIS — K621 Rectal polyp: Secondary | ICD-10-CM | POA: Diagnosis not present

## 2022-04-09 DIAGNOSIS — K635 Polyp of colon: Secondary | ICD-10-CM | POA: Diagnosis not present

## 2022-04-09 DIAGNOSIS — R1310 Dysphagia, unspecified: Secondary | ICD-10-CM | POA: Diagnosis not present

## 2022-04-09 MED ORDER — SODIUM CHLORIDE 0.9 % IV SOLN: 500.0000 mL | INTRAVENOUS | Status: DC

## 2022-04-09 NOTE — Progress Notes (Signed)
Pt's states no medical or surgical changes since previsit or office visit. 

## 2022-04-09 NOTE — Patient Instructions (Addendum)
Handouts were given to your care partner on the esophageal dilatation diet to follow the rest of today, follow the antireflux measures - handout provided, polyps, diverticulosis, hemorrhoids and a high fiber diet with liberal fluid intake. ?NO ASPIRIN, ASPIRIN CONTAINING PRODUCTS (BC OR GOODY POWDERS) OR NSAIDS (IBUPROFEN, ADVIL, ALEVE, AND MOTRIN) FOR 2 weeks; TYLENOL IS OK TO TAKE if needed. ?You may resume your other current medications today. ?Await biopsy results.  May take 1-3 weeks to receive pathology results. ?Repeat colonoscopy for surveillance in 1 year. ?Please call if any questions or concerns. ?  ? ? ?YOU HAD AN ENDOSCOPIC PROCEDURE TODAY AT [facility]:   Refer to the procedure report that was given to you for any specific questions about what was found during the examination.  If the procedure report does not answer your questions, please call your gastroenterologist to clarify.  If you requested that your care partner not be given the details of your procedure findings, then the procedure report has been included in a sealed envelope for you to review at your convenience later. ? ?YOU SHOULD EXPECT: Some feelings of bloating in the abdomen. Passage of more gas than usual.  Walking can help get rid of the air that was put into your GI tract during the procedure and reduce the bloating. If you had a lower endoscopy (such as a colonoscopy or flexible sigmoidoscopy) you may notice spotting of blood in your stool or on the toilet paper. If you underwent a bowel prep for your procedure, you may not have a normal bowel movement for a few days. ? ?Please Note:  You might notice some irritation and congestion in your nose or some drainage.  This is from the oxygen used during your procedure.  There is no need for concern and it should clear up in a day or so. ? ?SYMPTOMS TO REPORT IMMEDIATELY: ? ?Following lower endoscopy (colonoscopy or flexible sigmoidoscopy): ? Excessive amounts of blood  in the stool ? Significant tenderness or worsening of abdominal pains ? Swelling of the abdomen that is new, acute ? Fever of 100?F or higher ? ?Following upper endoscopy (EGD) ? Vomiting of blood or coffee ground material ? New chest pain or pain under the shoulder blades ? Painful or persistently difficult swallowing ? New shortness of breath ? Fever of 100?F or higher ? Black, tarry-looking stools ? ?For urgent or emergent issues, a gastroenterologist can be reached at any hour by calling [phone]. ?Do not use MyChart messaging for urgent concerns.  ? ? ?DIET:  Please follow the esophageal dilatation diet the rest of today.  Handout was given to your care partner.  Drink plenty of fluids but you should avoid alcoholic beverages for 24 hours. ? ?ACTIVITY:  You should plan to take it easy for the rest of today and you should NOT DRIVE or use heavy machinery until tomorrow (because of the sedation medicines used during the test).   ? ?FOLLOW UP: ?Our staff will call the number listed on your records 48-72 hours following your procedure to check on you and address any questions or concerns that you may have regarding the information given to you following your procedure. If we do not reach you, we will leave a message.  We will attempt to reach you two times.  During this call, we will ask if you have developed any symptoms of COVID 19. If you develop any symptoms (ie: fever, flu-like symptoms, shortness of breath, cough etc.) before  then, please call [phone].  If you test positive for Covid 19 in the 2 weeks post procedure, please call and report this information to [place].   ? ?If any biopsies were taken you will be contacted by phone or by letter within the next 1-3 weeks.  Please call us at [phone] if you have not heard about the biopsies in 3 weeks.  ? ? ?SIGNATURES/CONFIDENTIALITY: ?You and/or your care partner have signed paperwork which will be entered into your electronic medical record.   These signatures attest to the fact that that the information above on your After Visit Summary has been reviewed and is understood.  Full responsibility of the confidentiality of this discharge information lies with you and/or your care-partner.  ?

## 2022-04-09 NOTE — Progress Notes (Signed)
1450 BP noted to be low.  Rechecked in mult locations, ephedrine given.  Pt awake and bp still low, ephedrine repeated as charted. ?

## 2022-04-09 NOTE — Progress Notes (Signed)
Called to room to assist during endoscopic procedure.  Patient ID and intended procedure confirmed with present staff. Received instructions for my participation in the procedure from the performing physician.  

## 2022-04-09 NOTE — Op Note (Signed)
[place] ?Patient Name: George Leon ?Procedure Date: 04/09/2022 2:37 PM ?MRN: [id number] ?Endoscopist: Ladene Artist , MD ?Age: 66 ?Referring MD:  ?Date of Birth: 11-27-1956 ?Gender: Male ?Account #: [id number] ?Procedure:                Upper GI endoscopy ?Indications:              Dysphagia, GERD ?Medicines:                Monitored Anesthesia Care ?Procedure:                Pre-Anesthesia Assessment: ?                          - Prior to the procedure, a History and Physical  ?                          was performed, and patient medications and  ?                          allergies were reviewed. The patient's tolerance of  ?                          previous anesthesia was also reviewed. The risks  ?                          and benefits of the procedure and the sedation  ?                          options and risks were discussed with the patient.  ?                          All questions were answered, and informed consent  ?                          was obtained. Prior Anticoagulants: The patient has  ?                          taken no previous anticoagulant or antiplatelet  ?                          agents. ASA Grade Assessment: III - A patient with  ?                          severe systemic disease. After reviewing the risks  ?                          and benefits, the patient was deemed in  ?                          satisfactory condition to undergo the procedure. ?                          After obtaining informed consent, the endoscope was  ?  passed under direct vision. Throughout the  ?                          procedure, the patient's blood pressure, pulse, and  ?                          oxygen saturations were monitored continuously. The  ?                          GIF [id number] #[id number] was introduced through the  ?                          mouth, and advanced to the second part of duodenum.  ?                          The upper GI endoscopy was accomplished  without  ?                          difficulty. The patient tolerated the procedure  ?                          well. ?Scope In: ?Scope Out: ?Findings:                 No endoscopic abnormality was evident in the  ?                          esophagus to explain the patient's complaint of  ?                          dysphagia. It was decided, however, to proceed with  ?                          dilation of the entire esophagus. A guidewire was  ?                          placed and the scope was withdrawn. Dilation was  ?                          performed with a Savary dilator with no resistance  ?                          at 17 mm. ?                          The entire examined stomach was normal. ?                          The duodenal bulb and second portion of the  ?                          duodenum were normal. ?Complications:            No immediate complications. ?Estimated Blood Loss:     Estimated blood loss: none. ?Impression:               -  No endoscopic esophageal abnormality to explain  ?                          patient's dysphagia. Esophagus dilated. ?                          - Normal stomach. ?                          - Normal duodenal bulb and second portion of the  ?                          duodenum. ?                          - No specimens collected. ?Recommendation:           - Patient has a contact number available for  ?                          emergencies. The signs and symptoms of potential  ?                          delayed complications were discussed with the  ?                          patient. Return to normal activities tomorrow.  ?                          Written discharge instructions were provided to the  ?                          patient. ?                          - Clear liquid diet for 2 hours then advance as  ?                          tolerated to soft diet. ?                          - Resume prior diet tomorrow. ?                          - Follow antireflux  measures. ?                          - Continue present medications. ?Ladene Artist, MD ?04/09/2022 3:36:38 PM ?This report has been signed electronically. ?

## 2022-04-09 NOTE — Progress Notes (Signed)
Pt non-responsive, VVS, Report to RN  °

## 2022-04-09 NOTE — Progress Notes (Signed)
Pt's skin is thin.  I was careful removing tape and dressing.  Applies paper tape to site.  Skin was intact. ? ?No problems noted in the recovery room. maw  ?

## 2022-04-09 NOTE — Op Note (Addendum)
[place] ?Patient Name: George Leon ?Procedure Date: 04/09/2022 2:38 PM ?MRN: [id number] ?Endoscopist: Ladene Artist , MD ?Age: 66 ?Referring MD:  ?Date of Birth: [birth year] ?Gender: Male ?Account #: [id number] ?Procedure:                Colonoscopy ?Indications:              Surveillance: Personal history of adenomatous  ?                          polyps on last colonoscopy 3 years ago ?Medicines:                Monitored Anesthesia Care ?Procedure:                Pre-Anesthesia Assessment: ?                          - Prior to the procedure, a History and Physical  ?                          was performed, and patient medications and  ?                          allergies were reviewed. The patient's tolerance of  ?                          previous anesthesia was also reviewed. The risks  ?                          and benefits of the procedure and the sedation  ?                          options and risks were discussed with the patient.  ?                          All questions were answered, and informed consent  ?                          was obtained. Prior Anticoagulants: The patient has  ?                          taken no previous anticoagulant or antiplatelet  ?                          agents. ASA Grade Assessment: III - A patient with  ?                          severe systemic disease. After reviewing the risks  ?                          and benefits, the patient was deemed in  ?                          satisfactory condition to undergo the procedure. ?  After obtaining informed consent, the colonoscope  ?                          was passed under direct vision. Throughout the  ?                          procedure, the patient's blood pressure, pulse, and  ?                          oxygen saturations were monitored continuously. The  ?                          Colonoscope was introduced through the anus and  ?                          advanced to the the  cecum, identified by  ?                          appendiceal orifice and ileocecal valve. The  ?                          ileocecal valve, appendiceal orifice, and rectum  ?                          were photographed. The quality of the bowel  ?                          preparation was adequate to identify polyps 6 mm  ?                          and larger in size after extensive lavage, suction.  ?                          The colonoscopy was performed without difficulty.  ?                          The patient tolerated the procedure well. ?Scope In: 2:45:12 PM ?Scope Out: 3:17:06 PM ?Scope Withdrawal Time: 0 hours 29 minutes 0 seconds  ?Total Procedure Duration: 0 hours 31 minutes 54 seconds  ?Findings:                 The perianal and digital rectal examinations were  ?                          normal. ?                          A 20 mm polyp was found in the hepatic flexure. The  ?                          polyp was semi-pedunculated. The polyp was removed  ?                          with a hot snare. Resection and retrieval were  ?  complete. ?                          Twenty six sessile polyps were found in the rectum  ?                          (4), sigmoid colon (13), descending colon (4) and  ?                          transverse colon (5). The polyps were 6 to 9 mm in  ?                          size. These polyps were removed with a cold snare.  ?                          Resection and retrieval were complete. ?                          A few small-mouthed diverticula were found in the  ?                          left colon. ?                          Internal hemorrhoids were found during  ?                          retroflexion. The hemorrhoids were small and Grade  ?                          I (internal hemorrhoids that do not prolapse). ?                          The exam was otherwise without abnormality on  ?                          direct and retroflexion  views. ?Complications:            No immediate complications. Estimated blood loss:  ?                          None. ?Estimated Blood Loss:     Estimated blood loss: none. ?Impression:               - One 20 mm polyp at the hepatic flexure, removed  ?                          with a hot snare. Resected and retrieved. ?                          - Twenty-six 6 to 9 mm polyps in the rectum, in the  ?                          sigmoid colon, in the descending colon and in the  ?  transverse colon, removed with a cold snare.  ?                          Resected and retrieved. ?                          - Mild diverticulosis in the left colon. ?                          - Internal hemorrhoids. ?                          - The examination was otherwise normal on direct  ?                          and retroflexion views. ?Recommendation:           - Repeat colonoscopy in 1 year for surveillance  ?                          based on pathology results with a more extensive  ?                          bowel prep. ?                          - Patient has a contact number available for  ?                          emergencies. The signs and symptoms of potential  ?                          delayed complications were discussed with the  ?                          patient. Return to normal activities tomorrow.  ?                          Written discharge instructions were provided to the  ?                          patient. ?                          - High fiber diet. ?                          - Continue present medications. ?                          - Await pathology results. ?                          - No aspirin, ibuprofen, naproxen, or other  ?                          non-steroidal anti-inflammatory drugs for 2 weeks  ?  after polyp removal. ?Ladene Artist, MD ?04/09/2022 3:29:07 PM ?This report has been signed electronically. ?

## 2022-04-10 ENCOUNTER — Other Ambulatory Visit: Payer: Self-pay | Admitting: Student

## 2022-04-10 ENCOUNTER — Other Ambulatory Visit: Payer: Self-pay | Admitting: Cardiology

## 2022-04-10 ENCOUNTER — Encounter: Payer: Self-pay | Admitting: Student

## 2022-04-10 DIAGNOSIS — I5032 Chronic diastolic (congestive) heart failure: Secondary | ICD-10-CM

## 2022-04-10 DIAGNOSIS — J449 Chronic obstructive pulmonary disease, unspecified: Secondary | ICD-10-CM

## 2022-04-10 DIAGNOSIS — I10 Essential (primary) hypertension: Secondary | ICD-10-CM

## 2022-04-10 MED ORDER — AMLODIPINE BESYLATE 5 MG PO TABS: 5.0000 mg | ORAL_TABLET | Freq: Every day | ORAL | 3 refills | Status: DC

## 2022-04-10 MED ORDER — METOPROLOL SUCCINATE ER 100 MG PO TB24: 100.0000 mg | ORAL_TABLET | Freq: Every morning | ORAL | 0 refills | Status: DC

## 2022-04-10 MED ORDER — SPIRIVA RESPIMAT 2.5 MCG/ACT IN AERS: 2.0000 | INHALATION_SPRAY | Freq: Every day | RESPIRATORY_TRACT | 1 refills | Status: DC

## 2022-04-10 NOTE — Progress Notes (Signed)
Refill

## 2022-04-10 NOTE — Progress Notes (Signed)
Medication refill. Spiriva as insurance won't cover Incruse Ellipta ?

## 2022-04-11 ENCOUNTER — Telehealth: Payer: Self-pay

## 2022-04-11 NOTE — Telephone Encounter (Signed)
?  Follow up Call- ? ? ?  04/09/2022  ?  2:13 PM  ?Call back number  ?Post procedure Call Back phone  # [phone]  ?Permission to leave phone message Yes  ?  ? ?Patient questions: ? ?Do you have a fever, pain , or abdominal swelling? No. ?Pain Score  0 * ? ?Have you tolerated food without any problems? Yes.   ? ?Have you been able to return to your normal activities? Yes.   ? ?Do you have any questions about your discharge instructions: ?Diet   No. ?Medications  No. ?Follow up visit  No. ? ?Do you have questions or concerns about your Care? No. ? ?Actions: ?* If pain score is 4 or above: ?No action needed, pain <4. ? ? ?

## 2022-04-11 NOTE — Telephone Encounter (Signed)
Attempted to reach patient for post-procedure f/u call. No answer. Left message for him to please not hesitate to call us if he has any questions/concerns regarding his care. 

## 2022-04-14 ENCOUNTER — Other Ambulatory Visit: Payer: Self-pay | Admitting: Student

## 2022-04-14 DIAGNOSIS — G894 Chronic pain syndrome: Secondary | ICD-10-CM

## 2022-04-14 DIAGNOSIS — G8929 Other chronic pain: Secondary | ICD-10-CM

## 2022-04-15 ENCOUNTER — Encounter: Payer: Self-pay | Admitting: Gastroenterology

## 2022-04-17 ENCOUNTER — Other Ambulatory Visit (HOSPITAL_COMMUNITY): Payer: Self-pay

## 2022-04-17 MED ORDER — HYDROCODONE-ACETAMINOPHEN 7.5-325 MG PO TABS
1.0000 | ORAL_TABLET | Freq: Four times a day (QID) | ORAL | 0 refills | Status: DC | PRN
  Filled 2022-04-17 – 2022-04-19 (×3): qty 120, 30d supply, fill #0

## 2022-04-19 ENCOUNTER — Other Ambulatory Visit (HOSPITAL_COMMUNITY): Payer: Self-pay

## 2022-05-14 ENCOUNTER — Encounter: Payer: Self-pay | Admitting: *Deleted

## 2022-05-17 ENCOUNTER — Other Ambulatory Visit: Payer: Self-pay | Admitting: Student

## 2022-05-17 DIAGNOSIS — G8929 Other chronic pain: Secondary | ICD-10-CM

## 2022-05-17 DIAGNOSIS — G894 Chronic pain syndrome: Secondary | ICD-10-CM

## 2022-05-18 MED ORDER — HYDROCODONE-ACETAMINOPHEN 7.5-325 MG PO TABS
1.0000 | ORAL_TABLET | Freq: Four times a day (QID) | ORAL | 0 refills | Status: DC | PRN
  Filled 2022-05-18: qty 120, 30d supply, fill #0

## 2022-05-20 ENCOUNTER — Other Ambulatory Visit (HOSPITAL_COMMUNITY): Payer: Self-pay

## 2022-05-29 ENCOUNTER — Encounter: Payer: Self-pay | Admitting: Student

## 2022-05-29 ENCOUNTER — Ambulatory Visit (INDEPENDENT_AMBULATORY_CARE_PROVIDER_SITE_OTHER): Payer: Medicare Other | Admitting: Student

## 2022-05-29 DIAGNOSIS — G894 Chronic pain syndrome: Secondary | ICD-10-CM | POA: Diagnosis not present

## 2022-05-29 DIAGNOSIS — F1190 Opioid use, unspecified, uncomplicated: Secondary | ICD-10-CM | POA: Diagnosis not present

## 2022-05-29 NOTE — Patient Instructions (Addendum)
It was wonderful to see you today. Thank you for allowing me to be a part of your care. Below is a short summary of what we discussed at your visit today:  Opioid treatment agreement form was completed today.  Also collected urine same for drug screening which is part of the opoid medication treatment agreement.  Follow-up when you are ready to get your knee steroid injections  If you have any questions or concerns, please do not hesitate to contact us via phone or MyChart message.   Alen Bleacher, MD [facility]

## 2022-05-29 NOTE — Assessment & Plan Note (Addendum)
Patient's medication include Norco 7.5-325 for chronic pain.  He reports tolerance with his medication. -PDMP reviewed -Opioid treatment agreement form signed by patient -Obtain UDS as part of opoid tx agreement -Pt to return at a later date for intraventricular steroid injection of the knees

## 2022-05-29 NOTE — Progress Notes (Signed)
    SUBJECTIVE:   CHIEF COMPLAINT / HPI:   George Leon is a 66 year old male with history of chronic opioid treatment for chronic pain.  Patient reports good tolerance to medication and denies any side effects.  He endorses occasional pain in his lower back, hands and knees.  Sometimes his arthritis are worse on rainy days however he feels his pain is much controlled on his medication.  Patient would like to get knee steroid injections today however his wife is currently out of insurance and will not be covered until July.  He will like to wait until July to get his bilateral knee injections.  PERTINENT  PMH / PSH: PVD, CHF, complete heart block s/p pacemaker inplant and bilateral osteoarthritis of the knee.  OBJECTIVE:   BP 129/74   Pulse 84   Ht '5\' 6"'$  (1.676 m)   Wt 248 lb (112.5 kg)   SpO2 96%   BMI 40.03 kg/m     Physical Exam General: Well appearing, NAD, Oriented x3 Cardiovascular: RRR, No Murmurs,  Respiratory: CTAB, No wheezing or Rales Extremities: No edema on extremities    ASSESSMENT/PLAN:   Chronic narcotic use Patient's medication include Norco 7.5-325 for chronic pain.  He reports tolerance with his medication. -PDMP reviewed -Opioid treatment agreement form signed by patient -Obtain UDS as part of opoid tx agreement -Pt to return at a later date for intraventricular steroid injection of the knees     George Bleacher, MD [place]

## 2022-06-06 LAB — TOXASSURE SELECT 13 (MW), URINE

## 2022-06-16 ENCOUNTER — Encounter: Payer: Self-pay | Admitting: Student

## 2022-06-16 DIAGNOSIS — G894 Chronic pain syndrome: Secondary | ICD-10-CM

## 2022-06-16 DIAGNOSIS — G8929 Other chronic pain: Secondary | ICD-10-CM

## 2022-06-17 ENCOUNTER — Other Ambulatory Visit: Payer: Self-pay | Admitting: Cardiology

## 2022-06-17 ENCOUNTER — Other Ambulatory Visit: Payer: Self-pay | Admitting: Student

## 2022-06-17 DIAGNOSIS — I5032 Chronic diastolic (congestive) heart failure: Secondary | ICD-10-CM

## 2022-06-17 DIAGNOSIS — J449 Chronic obstructive pulmonary disease, unspecified: Secondary | ICD-10-CM

## 2022-06-17 MED ORDER — HYDROCODONE-ACETAMINOPHEN 7.5-325 MG PO TABS: 1.0000 | ORAL_TABLET | Freq: Four times a day (QID) | ORAL | 0 refills | Status: DC | PRN

## 2022-06-18 ENCOUNTER — Encounter: Payer: Self-pay | Admitting: Cardiology

## 2022-06-19 NOTE — Telephone Encounter (Signed)
From pt

## 2022-07-02 DIAGNOSIS — I442 Atrioventricular block, complete: Secondary | ICD-10-CM | POA: Diagnosis not present

## 2022-07-02 DIAGNOSIS — Z45018 Encounter for adjustment and management of other part of cardiac pacemaker: Secondary | ICD-10-CM | POA: Diagnosis not present

## 2022-07-09 ENCOUNTER — Other Ambulatory Visit: Payer: Self-pay | Admitting: Student

## 2022-07-09 DIAGNOSIS — I5032 Chronic diastolic (congestive) heart failure: Secondary | ICD-10-CM

## 2022-07-17 ENCOUNTER — Encounter: Payer: Self-pay | Admitting: Student

## 2022-07-17 ENCOUNTER — Other Ambulatory Visit: Payer: Self-pay | Admitting: Student

## 2022-07-17 DIAGNOSIS — G894 Chronic pain syndrome: Secondary | ICD-10-CM

## 2022-07-17 DIAGNOSIS — G8929 Other chronic pain: Secondary | ICD-10-CM

## 2022-07-18 MED ORDER — HYDROCODONE-ACETAMINOPHEN 7.5-325 MG PO TABS: 1.0000 | ORAL_TABLET | Freq: Four times a day (QID) | ORAL | 0 refills | Status: DC | PRN

## 2022-07-19 ENCOUNTER — Encounter: Payer: Self-pay | Admitting: Student

## 2022-07-19 DIAGNOSIS — E785 Hyperlipidemia, unspecified: Secondary | ICD-10-CM

## 2022-07-19 DIAGNOSIS — M545 Low back pain, unspecified: Secondary | ICD-10-CM

## 2022-07-19 DIAGNOSIS — G894 Chronic pain syndrome: Secondary | ICD-10-CM

## 2022-07-19 MED ORDER — DULOXETINE HCL 60 MG PO CPEP: 60.0000 mg | ORAL_CAPSULE | Freq: Every day | ORAL | 3 refills | Status: DC

## 2022-07-19 MED ORDER — ATORVASTATIN CALCIUM 80 MG PO TABS: 80.0000 mg | ORAL_TABLET | Freq: Every day | ORAL | 3 refills | Status: DC

## 2022-07-28 ENCOUNTER — Other Ambulatory Visit: Payer: Self-pay | Admitting: Cardiology

## 2022-07-28 DIAGNOSIS — I5032 Chronic diastolic (congestive) heart failure: Secondary | ICD-10-CM

## 2022-07-29 MED ORDER — ENTRESTO 49-51 MG PO TABS: 1.0000 | ORAL_TABLET | Freq: Two times a day (BID) | ORAL | 0 refills | Status: DC

## 2022-08-01 ENCOUNTER — Other Ambulatory Visit: Payer: Self-pay | Admitting: Cardiology

## 2022-08-01 DIAGNOSIS — I5032 Chronic diastolic (congestive) heart failure: Secondary | ICD-10-CM

## 2022-08-05 ENCOUNTER — Other Ambulatory Visit: Payer: Self-pay

## 2022-08-05 DIAGNOSIS — I5032 Chronic diastolic (congestive) heart failure: Secondary | ICD-10-CM

## 2022-08-05 MED ORDER — ENTRESTO 49-51 MG PO TABS: 1.0000 | ORAL_TABLET | Freq: Two times a day (BID) | ORAL | 3 refills | Status: DC

## 2022-08-05 MED ORDER — EMPAGLIFLOZIN 10 MG PO TABS: 10.0000 mg | ORAL_TABLET | Freq: Every day | ORAL | 3 refills | Status: DC

## 2022-08-13 ENCOUNTER — Ambulatory Visit: Payer: Medicare Other | Admitting: Cardiology

## 2022-08-15 ENCOUNTER — Other Ambulatory Visit: Payer: Self-pay | Admitting: Student

## 2022-08-15 DIAGNOSIS — G8929 Other chronic pain: Secondary | ICD-10-CM

## 2022-08-15 DIAGNOSIS — G894 Chronic pain syndrome: Secondary | ICD-10-CM

## 2022-08-16 MED ORDER — HYDROCODONE-ACETAMINOPHEN 7.5-325 MG PO TABS: 1.0000 | ORAL_TABLET | Freq: Four times a day (QID) | ORAL | 0 refills | Status: DC | PRN

## 2022-08-28 ENCOUNTER — Encounter: Payer: Self-pay | Admitting: Student

## 2022-08-28 DIAGNOSIS — J449 Chronic obstructive pulmonary disease, unspecified: Secondary | ICD-10-CM

## 2022-08-29 MED ORDER — ALBUTEROL SULFATE HFA 108 (90 BASE) MCG/ACT IN AERS: INHALATION_SPRAY | RESPIRATORY_TRACT | 4 refills | Status: AC

## 2022-09-03 ENCOUNTER — Ambulatory Visit: Payer: Medicare Other | Admitting: Cardiology

## 2022-09-06 ENCOUNTER — Encounter: Payer: Self-pay | Admitting: Student

## 2022-09-06 NOTE — Telephone Encounter (Signed)
Patient requesting refill on diclofenac gel. On med list, this was previously ordered as clinic administered medication.   I have pended the medication to this encounter. Please update the prescription with the dose, dispense quantity and directions.   Talbot Grumbling, RN

## 2022-09-08 MED ORDER — DICLOFENAC SODIUM 1 % EX GEL
2.0000 g | Freq: Four times a day (QID) | CUTANEOUS | 11 refills | Status: AC
Start: 2022-09-08 — End: ?

## 2022-09-12 ENCOUNTER — Other Ambulatory Visit: Payer: Self-pay | Admitting: Student

## 2022-09-12 DIAGNOSIS — G894 Chronic pain syndrome: Secondary | ICD-10-CM

## 2022-09-12 DIAGNOSIS — G8929 Other chronic pain: Secondary | ICD-10-CM

## 2022-09-16 ENCOUNTER — Ambulatory Visit: Payer: Medicare Other | Admitting: Cardiology

## 2022-09-16 ENCOUNTER — Other Ambulatory Visit: Payer: Self-pay | Admitting: Student

## 2022-09-16 ENCOUNTER — Encounter: Payer: Self-pay | Admitting: Cardiology

## 2022-09-16 VITALS — BP 126/73 | HR 81 | Temp 98.3°F | Resp 16 | Ht 66.0 in | Wt 256.0 lb

## 2022-09-16 DIAGNOSIS — I441 Atrioventricular block, second degree: Secondary | ICD-10-CM

## 2022-09-16 DIAGNOSIS — G894 Chronic pain syndrome: Secondary | ICD-10-CM

## 2022-09-16 DIAGNOSIS — I739 Peripheral vascular disease, unspecified: Secondary | ICD-10-CM | POA: Diagnosis not present

## 2022-09-16 DIAGNOSIS — Z950 Presence of cardiac pacemaker: Secondary | ICD-10-CM | POA: Diagnosis not present

## 2022-09-16 DIAGNOSIS — Z87891 Personal history of nicotine dependence: Secondary | ICD-10-CM | POA: Diagnosis not present

## 2022-09-16 DIAGNOSIS — I5032 Chronic diastolic (congestive) heart failure: Secondary | ICD-10-CM | POA: Diagnosis not present

## 2022-09-16 DIAGNOSIS — G8929 Other chronic pain: Secondary | ICD-10-CM

## 2022-09-16 MED ORDER — HYDROCODONE-ACETAMINOPHEN 7.5-325 MG PO TABS: 1.0000 | ORAL_TABLET | Freq: Four times a day (QID) | ORAL | 0 refills | Status: DC | PRN

## 2022-09-16 MED ORDER — ASPIRIN 81 MG PO TBEC: 81.0000 mg | DELAYED_RELEASE_TABLET | Freq: Every day | ORAL | 12 refills | Status: AC

## 2022-09-16 NOTE — Progress Notes (Signed)
Date:  09/16/2022   ID:  George Leon, DOB 03-25-1956, MRN [id number]  PCP:  Alen Bleacher, MD  Cardiologist:  Rex Kras, DO, [facility] (established care 07/02/2021)  Date: 09/16/22 Last Office Visit: 08/20/2021  Chief Complaint  Patient presents with   Follow-up    Annual follow-up, HFpEF, pacemaker    HPI  George Leon is a 66 y.o. male whose past medical history and cardiovascular risk factors include: Status post dual-chamber pacemaker for symptomatic bradycardia and complete heart block, HFpEF, hypertension, peripheral vascular disease (per patient), hyperlipidemia, obesity due to excess calories, tobacco use, COPD.  Patient was referred to the practice for evaluation of possible HFpEF.  But during the initial office visit he was noted to be in second-degree type II AV block with episodes of high degree AV block as well.  He underwent pacemaker implantation with Dr. Lovena Le.  Subsequently left heart catheterization which noted no significant epicardial CAD.  He now presents for 1 year follow-up visit.  Since last office visit patient has not been hospitalized or heart related symptoms.  In the recent past his wife lost insurance and he was out of his heart failure medications for some time.  However he has been approved via patient assistance for Jardiance and Delene Loll and he is taking the medications as prescribed.  He denies chest pain or heart failure symptoms.  He denies any claudication.  He remains smoke-free for the last 1 year for which she is congratulated for.  He still has residual dyspnea more noticeable with effort related activities may be pulmonary in etiology given his extensive history of smoking.  FUNCTIONAL STATUS: No structured exercise program or daily routine.   ALLERGIES: Allergies  Allergen Reactions   Adhesive [Tape] Rash    Paper tape is what bothers him Pulled skin off also   Hydrochlorothiazide Other (See Comments)    Back Pain & gives him kidney  stones   Sulfamethoxazole Swelling and Other (See Comments)    Tongue Swelling. Pt states he has had a sulfa drug since and has tolerated them.    MEDICATION LIST PRIOR TO VISIT: Current Meds  Medication Sig   acetaminophen (TYLENOL) 325 MG tablet Take 2 tablets (650 mg total) by mouth every 4 (four) hours as needed for headache or mild pain.   albuterol (VENTOLIN HFA) 108 (90 Base) MCG/ACT inhaler USE 1 TO 2 INHALATIONS EVERY 4 HOURS AS NEEDED FOR SHORTNESS OF BREATH/WHEEZE/COUGH.   amLODipine (NORVASC) 5 MG tablet Take 1 tablet (5 mg total) by mouth daily.   aspirin EC 81 MG tablet Take 1 tablet (81 mg total) by mouth daily. Swallow whole.   atorvastatin (LIPITOR) 80 MG tablet Take 1 tablet (80 mg total) by mouth daily.   diclofenac Sodium (VOLTAREN) 1 % GEL Apply 2 g topically 4 (four) times daily.   DULoxetine (CYMBALTA) 60 MG capsule Take 1 capsule (60 mg total) by mouth daily.   Elastic Bandages & Supports (MEDICAL COMPRESSION STOCKINGS) MISC 1 each by Does not apply route once.   empagliflozin (JARDIANCE) 10 MG TABS tablet Take 1 tablet (10 mg total) by mouth daily before breakfast.   esomeprazole (NEXIUM) 40 MG capsule TAKE 1 CAPSULE DAILY AT 12 NOON   ezetimibe (ZETIA) 10 MG tablet Take 1 tablet (10 mg total) by mouth daily.   HYDROcodone-acetaminophen (NORCO) 7.5-325 MG tablet Take 1 tablet by mouth every 6 (six) hours as needed for moderate pain.   INCRUSE ELLIPTA 62.5 MCG/INH AEPB USE 1 INHALATION  DAILY   metoprolol succinate (TOPROL-XL) 100 MG 24 hr tablet TAKE 1 TABLET (100 MG TOTAL) BY MOUTH EVERY MORNING. TAKE WITH OR IMMEDIATELY FOLLOWING A MEAL.   Multiple Vitamins-Minerals (OCUVITE ADULT FORMULA) CAPS Take 1 capsule by mouth daily.   naloxone (NARCAN) 0.4 MG/ML injection Use as needed for signs of overdose.   nicotine ([employer] NICOTINE) 21 mg/24hr patch PLACE 1 PATCH ONTO SKIN DAILY   nystatin cream (MYCOSTATIN) APPLY TOPICALLY TWICE A DAY   sacubitril-valsartan (ENTRESTO)  49-51 MG Take 1 tablet by mouth 2 (two) times daily.   selenium sulfide (SELSUN) 2.5 % shampoo APPLY 1 APPLICATION TOPICALLY DAILY AS NEEDED FOR IRRITATION   sodium chloride (OCEAN) 0.65 % SOLN nasal spray Place 1 spray into both nostrils as needed for congestion.   Current Facility-Administered Medications for the 09/16/22 encounter (Office Visit) with Rex Kras, DO  Medication   diclofenac Sodium (VOLTAREN) 1 % topical gel 2 g     PAST MEDICAL HISTORY: Past Medical History:  Diagnosis Date   Acute MI ([place])    Acute on chronic diastolic heart failure ([place]) 12/01/2015   Anxiety    Arthritis    "qwhere" (08/06/2017)   CHF (congestive heart failure) ([place]) dx'd 2016   Chronic back pain    "all my back" (08/06/2017)   Chronic bronchitis (HCC)    Chronic pain    Chronic pain syndrome 12/01/2015   Complete heart block (HCC)    COPD (chronic obstructive pulmonary disease) (HCC)    Dyspnea    w/ exertion    Dyspnea on exertion    Emphysema of lung ([place])    Encounter for care of pacemaker 07/04/2021   High cholesterol    High degree atrioventricular block    History of hiatal hernia    possible     Hypertension    Liver fatty degeneration    Macular degeneration, right eye    Pacemaker Biotronik [place] dual chamber transmission  07/04/2021   Pacemaker Dual chamber Biotronik [place] 8dr-T Mri - [id number] pacemaker 07/04/2021 07/04/2021   Pneumonia  1990s X 1; 2016    PAST SURGICAL HISTORY: Past Surgical History:  Procedure Laterality Date   ANTERIOR CERVICAL DECOMP/DISCECTOMY FUSION  1988   "plate; cadavar bone; couple bolts"   BACK SURGERY     CARDIAC CATHETERIZATION     JOINT REPLACEMENT     KNEE ARTHROSCOPY Left 05/27/2017   Procedure: LEFT KNEE ARTHROSCOPY WITH PARTIAL MEDIAL MENISCECTOMY;  Surgeon: Mcarthur Rossetti, MD;  Location: [place];  Service: Orthopedics;  Laterality: Left;   LEFT HEART CATH AND CORONARY ANGIOGRAPHY N/A 07/03/2021   Procedure: LEFT HEART CATH AND  CORONARY ANGIOGRAPHY;  Surgeon: [name], MD;  Location: [place] [facility];  Service: Cardiovascular;  Laterality: N/A;   PACEMAKER IMPLANT N/A 07/04/2021   Procedure: PACEMAKER IMPLANT;  Surgeon: Evans Lance, MD;  Location: [place] [facility];  Service: Cardiovascular;  Laterality: N/A;   SHOULDER ARTHROSCOPY WITH ROTATOR CUFF REPAIR Right    TOTAL HIP ARTHROPLASTY Left 08/05/2017   Procedure: LEFT TOTAL HIP ARTHROPLASTY ANTERIOR APPROACH;  Surgeon: Mcarthur Rossetti, MD;  Location: [place];  Service: Orthopedics;  Laterality: Left;    FAMILY HISTORY: The patient family history includes AAA (abdominal aortic aneurysm) in his father; Hypertension in his father.  SOCIAL HISTORY:  The patient  reports that he has quit smoking. His smoking use included cigarettes. He has a 45.00 pack-year smoking history. He has quit using smokeless tobacco.  His smokeless  tobacco use included chew. He reports that he does not currently use alcohol. He reports that he does not currently use drugs after having used the following drugs: Marijuana.  REVIEW OF SYSTEMS: Review of Systems  Cardiovascular:  Negative for chest pain, claudication, dyspnea on exertion, irregular heartbeat, leg swelling, near-syncope, orthopnea, palpitations, paroxysmal nocturnal dyspnea and syncope.  Respiratory:  Negative for shortness of breath.   Hematologic/Lymphatic: Negative for bleeding problem.  Musculoskeletal:  Negative for muscle cramps and myalgias.  Neurological:  Negative for dizziness and light-headedness.    PHYSICAL EXAM:    09/16/2022    3:22 PM 05/29/2022    2:15 PM 04/09/2022    3:53 PM  Vitals with BMI  Height '5\' 6"'$  '5\' 6"'$    Weight 256 lbs 248 lbs   BMI 09.38 18.29   Systolic 937 169 678  Diastolic 73 74 62  Pulse 81 84 78    CONSTITUTIONAL: Appears older than stated age, hemodynamically stable, no acute distress.  Ambulates with a cane. SKIN: Skin is warm and dry. No rash noted. No cyanosis. No  pallor. No jaundice HEAD: Normocephalic and atraumatic.  EYES: No scleral icterus MOUTH/THROAT: Moist oral membranes.  NECK: No JVD present. No thyromegaly noted. No carotid bruits  CHEST Normal respiratory effort. No intercostal retractions.  Pacemaker site healed. LUNGS: Decreased breath sounds bilaterally without expiratory wheezes present.  No stridor. No rales.  CARDIOVASCULAR: Regular, positive S1-S2, no murmurs rubs or gallops appreciated. ABDOMINAL: Obese, soft, nontender, nondistended, positive bowel sounds in all 4 quadrants, no apparent ascites.  EXTREMITIES: no peripheral edema, compression stocks present, +2 DP and +1 PT bilateral.  HEMATOLOGIC: No significant bruising NEUROLOGIC: Oriented to person, place, and time. Nonfocal. Normal muscle tone.  PSYCHIATRIC: Normal mood and affect. Normal behavior. Cooperative  CARDIAC DATABASE: EKG: 09/16/2022: Sinus rhythm, atrial sensed ventricular paced rhythm 75 bpm.  Echocardiogram: 07/03/2021: LVEF 60-65%, no regional wall motion abnormalities, mild LVH, grade 1 diastolic impairment, normal right ventricular size and function, mild MR, right atrial pressure 15 mmHg.  Stress Testing: No results found for this or any previous visit from the past 1095 days.  Heart catheterization: Left Heart Catheterization 07/03/21: No significant CAD.  Moderately elevated EDP. LV: 125 mmHg / 12 mmHg, EDP 22 mmHg.  Aortic pressure 109/46 with a mean of 69 mmHg.  There was no pressure gradient across the aortic valve.  LVEDP moderately elevated.  LVEF 65%, no significant MR. LM: Large vessel, normal. LAD: Very large vessel, gives origin to 3 large diagonals, very mild disease is evident with mild calcification in the proximal segment. CX: Moderate sized vessel, smooth and normal. RCA: Very large vessel, PL branch is very large.  There is mild disease in the distal RCA.  Mild calcification is evident in the proximal segment. Recommendation: No  reversible cause for heart block.  He will be scheduled for pacemaker implantation tomorrow.  Lower Extremity Arterial Duplex 08/10/2021:  Proximal to mid right SFA >50% stenosis with monophasic and dampened  waveform pattern. There is diffuse disease and monophasic waveform pattern in the right below knee small arteries.  No hemodynamically significant stenosis identified in the left lower extremity. There is diffuse plaque and severely abnormal monophasic waveform  in the small vessels below knee.  This exam reveals mildly decreased perfusion of the right lower extremity, noted at the anterior tibial artery level (ABI  0.90) and moderately decreased perfusion of the left lower extremity at the post tibial artery level (ABI 0.71).   Abdominal  Aortic Duplex 08/10/2021:  The maximum aorta (sac) diameter is 2.06 cm (prox). No AAA observed. No evidence of significant  atherosclerotic plaque. Normal flow velocities noted in bilateral common iliac arteries.   Dual-chamber pacemaker implantation: 07/04/2021:  1. Successful implantation of a Biotronik dual-chamber pacemaker for symptomatic bradycardia due to complete heart block  Remote pacemaker transmission 07/02/2022: 0%, VP 100%.  Longevity battery life 90%.  Lead impedance and thresholds are normal.  No high rate rate episodes.  Normal Function.  Unscheduled (Alert) 09/15/22: NSVT 6 S  LABORATORY DATA:    Latest Ref Rng & Units 07/05/2021    3:26 AM 07/04/2021    2:22 AM 07/03/2021    4:38 AM  CBC  WBC 4.0 - 10.5 K/uL 12.5  14.6  15.5   Hemoglobin 13.0 - 17.0 g/dL 12.7  12.7  12.3   Hematocrit 39.0 - 52.0 % 39.1  39.5  39.5   Platelets 150 - 400 K/uL 197  229  210        Latest Ref Rng & Units 09/03/2021    2:36 PM 07/23/2021   11:57 AM 07/05/2021    3:26 AM  CMP  Glucose 70 - 99 mg/dL 98  90  108   BUN 8 - 27 mg/dL '9  11  7   '$ Creatinine 0.76 - 1.27 mg/dL 0.92  0.69  0.71   Sodium 134 - 144 mmol/L 142  138  136   Potassium 3.5 - 5.2  mmol/L 4.7  4.9  3.6   Chloride 96 - 106 mmol/L 103  102  103   CO2 20 - 29 mmol/L '21  23  26   '$ Calcium 8.6 - 10.2 mg/dL 9.0  9.1  8.6     Lipid Panel  Lab Results  Component Value Date   CHOL 146 04/02/2021   HDL 35 (L) 04/02/2021   LDLCALC 86 04/02/2021   LDLDIRECT 85 07/22/2012   TRIG 140 04/02/2021   CHOLHDL 4.2 04/02/2021   No components found for: "NTPROBNP" No results for input(s): "PROBNP" in the last 8760 hours.  No results for input(s): "TSH" in the last 8760 hours.   BMP No results for input(s): "NA", "K", "CL", "CO2", "GLUCOSE", "BUN", "CREATININE", "CALCIUM", "GFRNONAA", "GFRAA" in the last 8760 hours.  HEMOGLOBIN A1C Lab Results  Component Value Date   HGBA1C 6.1 08/04/2017    IMPRESSION:    ICD-10-CM   1. Chronic heart failure with preserved ejection fraction (HCC)  I50.32 EKG 12-Lead    2. PAD (peripheral artery disease) (HCC)  I73.9 aspirin EC 81 MG tablet    3. Cardiac pacemaker in situ  Z95.0     4. Second degree AV block, Mobitz type II  I44.1     5. Former smoker  Z87.891        RECOMMENDATIONS: George Leon is a 66 y.o. male whose past medical history and cardiac risk factors include: Status post dual-chamber pacemaker for symptomatic bradycardia and complete heart block, HFpEF, hypertension, peripheral vascular disease (per patient), hyperlipidemia, obesity due to excess calories, tobacco use, COPD.  Chronic heart failure with preserved ejection fraction (HCC) Euvolemic. No hospitalizations for cardiovascular symptoms over the last 1 year. Medications reconciled. Recently got approved for patient assistance for both Entresto and Jardiance. Reemphasized importance of a low-salt diet, strict I's and O's and daily weight..  Recently underwent ischemic work-up and no additional testing warranted at this time  PAD (peripheral artery disease) ([place]) Denies claudication. Has remained smoke-free for the  last 1 year. Continue aspirin,  statin therapy, Zetia  Cardiac pacemaker in situ / Second degree AV block, Mobitz type II Past remote transmissions reviewed. We will schedule for in office annual pacemaker check  Former smoker Congratulated with his efforts of complete smoking cessation for the last 1 year.  Patient has shortness of breath which is chronic and stable.  Likely due to pulmonary etiologies given his prolonged history of smoking and decreased breath sounds bilaterally.  Underlying cardiac cause is very low in probability.  I have asked him to discuss possible pulmonary evaluation with PCP to see if is a candidate for inhalers and/or additional testing.   FINAL MEDICATION LIST END OF ENCOUNTER: Meds ordered this encounter  Medications   aspirin EC 81 MG tablet    Sig: Take 1 tablet (81 mg total) by mouth daily. Swallow whole.    Dispense:  30 tablet    Refill:  12     Medications Discontinued During This Encounter  Medication Reason   SPIRIVA RESPIMAT 2.5 MCG/ACT AERS     Current Outpatient Medications:    acetaminophen (TYLENOL) 325 MG tablet, Take 2 tablets (650 mg total) by mouth every 4 (four) hours as needed for headache or mild pain., Disp: , Rfl:    albuterol (VENTOLIN HFA) 108 (90 Base) MCG/ACT inhaler, USE 1 TO 2 INHALATIONS EVERY 4 HOURS AS NEEDED FOR SHORTNESS OF BREATH/WHEEZE/COUGH., Disp: 17 g, Rfl: 4   amLODipine (NORVASC) 5 MG tablet, Take 1 tablet (5 mg total) by mouth daily., Disp: 90 tablet, Rfl: 3   aspirin EC 81 MG tablet, Take 1 tablet (81 mg total) by mouth daily. Swallow whole., Disp: 30 tablet, Rfl: 12   atorvastatin (LIPITOR) 80 MG tablet, Take 1 tablet (80 mg total) by mouth daily., Disp: 90 tablet, Rfl: 3   diclofenac Sodium (VOLTAREN) 1 % GEL, Apply 2 g topically 4 (four) times daily., Disp: 2 g, Rfl: 11   DULoxetine (CYMBALTA) 60 MG capsule, Take 1 capsule (60 mg total) by mouth daily., Disp: 90 capsule, Rfl: 3   Elastic Bandages & Supports ([place])  MISC, 1 each by Does not apply route once., Disp: 2 each, Rfl: 0   empagliflozin (JARDIANCE) 10 MG TABS tablet, Take 1 tablet (10 mg total) by mouth daily before breakfast., Disp: 90 tablet, Rfl: 3   esomeprazole (NEXIUM) 40 MG capsule, TAKE 1 CAPSULE DAILY AT 12 NOON, Disp: 90 capsule, Rfl: 3   ezetimibe (ZETIA) 10 MG tablet, Take 1 tablet (10 mg total) by mouth daily., Disp: 90 tablet, Rfl: 3   HYDROcodone-acetaminophen (NORCO) 7.5-325 MG tablet, Take 1 tablet by mouth every 6 (six) hours as needed for moderate pain., Disp: 120 tablet, Rfl: 0   INCRUSE ELLIPTA 62.5 MCG/INH AEPB, USE 1 INHALATION DAILY, Disp: 90 each, Rfl: 3   metoprolol succinate (TOPROL-XL) 100 MG 24 hr tablet, TAKE 1 TABLET (100 MG TOTAL) BY MOUTH EVERY MORNING. TAKE WITH OR IMMEDIATELY FOLLOWING A MEAL., Disp: 90 tablet, Rfl: 0   Multiple Vitamins-Minerals (OCUVITE ADULT FORMULA) CAPS, Take 1 capsule by mouth daily., Disp: 90 capsule, Rfl: 3   naloxone (NARCAN) 0.4 MG/ML injection, Use as needed for signs of overdose., Disp: 1 mL, Rfl: 0   nicotine ([employer] NICOTINE) 21 mg/24hr patch, PLACE 1 PATCH ONTO SKIN DAILY, Disp: 42 patch, Rfl: 2   nystatin cream (MYCOSTATIN), APPLY TOPICALLY TWICE A DAY, Disp: 30 g, Rfl: 23   sacubitril-valsartan (ENTRESTO) 49-51 MG, Take 1 tablet by mouth 2 (two)  times daily., Disp: 180 tablet, Rfl: 3   selenium sulfide (SELSUN) 2.5 % shampoo, APPLY 1 APPLICATION TOPICALLY DAILY AS NEEDED FOR IRRITATION, Disp: 120 mL, Rfl: 3   sodium chloride (OCEAN) 0.65 % SOLN nasal spray, Place 1 spray into both nostrils as needed for congestion., Disp: , Rfl:   Current Facility-Administered Medications:    diclofenac Sodium (VOLTAREN) 1 % topical gel 2 g, 2 g, Topical, PRN, Alen Bleacher, MD  Orders Placed This Encounter  Procedures   EKG 12-Lead    There are no Patient Instructions on file for this visit.   --Continue cardiac medications as reconciled in final medication list. --Return in about 1 year  (around 09/17/2023) for Follow up, heart failure management,PPM. Or sooner if needed. --Continue follow-up with your primary care physician regarding the management of your other chronic comorbid conditions.  Patient's questions and concerns were addressed to his satisfaction. He voices understanding of the instructions provided during this encounter.   This note was created using a voice recognition software as a result there may be grammatical errors inadvertently enclosed that do not reflect the nature of this encounter. Every attempt is made to correct such errors.  Rex Kras, [place], [facility]  Pager: [phone] Office: [phone]

## 2022-09-18 ENCOUNTER — Emergency Department (HOSPITAL_COMMUNITY): Payer: Medicare Other

## 2022-09-18 ENCOUNTER — Encounter (HOSPITAL_COMMUNITY): Payer: Self-pay

## 2022-09-18 ENCOUNTER — Emergency Department (HOSPITAL_COMMUNITY)
Admission: EM | Admit: 2022-09-18 | Discharge: 2022-09-18 | Disposition: A | Discharge status: Home / Self Care | Payer: Medicare Other | Attending: Emergency Medicine | Admitting: Emergency Medicine

## 2022-09-18 ENCOUNTER — Other Ambulatory Visit: Payer: Self-pay

## 2022-09-18 DIAGNOSIS — R000 Tachycardia, unspecified: Secondary | ICD-10-CM | POA: Insufficient documentation

## 2022-09-18 DIAGNOSIS — M549 Dorsalgia, unspecified: Secondary | ICD-10-CM | POA: Diagnosis not present

## 2022-09-18 DIAGNOSIS — Y900 Blood alcohol level of less than 20 mg/100 ml: Secondary | ICD-10-CM | POA: Insufficient documentation

## 2022-09-18 DIAGNOSIS — R4182 Altered mental status, unspecified: Secondary | ICD-10-CM | POA: Insufficient documentation

## 2022-09-18 DIAGNOSIS — G8929 Other chronic pain: Secondary | ICD-10-CM | POA: Diagnosis not present

## 2022-09-18 DIAGNOSIS — R531 Weakness: Secondary | ICD-10-CM | POA: Diagnosis not present

## 2022-09-18 DIAGNOSIS — Z79899 Other long term (current) drug therapy: Secondary | ICD-10-CM | POA: Insufficient documentation

## 2022-09-18 DIAGNOSIS — N179 Acute kidney failure, unspecified: Secondary | ICD-10-CM

## 2022-09-18 DIAGNOSIS — I959 Hypotension, unspecified: Secondary | ICD-10-CM | POA: Diagnosis not present

## 2022-09-18 DIAGNOSIS — T402X1A Poisoning by other opioids, accidental (unintentional), initial encounter: Secondary | ICD-10-CM

## 2022-09-18 DIAGNOSIS — F32A Depression, unspecified: Secondary | ICD-10-CM | POA: Diagnosis not present

## 2022-09-18 DIAGNOSIS — R0902 Hypoxemia: Secondary | ICD-10-CM | POA: Diagnosis not present

## 2022-09-18 LAB — RAPID URINE DRUG SCREEN, HOSP PERFORMED
Amphetamines: NOT DETECTED
Barbiturates: NOT DETECTED
Benzodiazepines: NOT DETECTED
Cocaine: NOT DETECTED
Opiates: POSITIVE — AB
Tetrahydrocannabinol: POSITIVE — AB

## 2022-09-18 LAB — CBC WITH DIFFERENTIAL/PLATELET
Abs Immature Granulocytes: 0.05 10*3/uL (ref 0.00–0.07)
Basophils Absolute: 0 10*3/uL (ref 0.0–0.1)
Basophils Relative: 0 %
Eosinophils Absolute: 0.1 10*3/uL (ref 0.0–0.5)
Eosinophils Relative: 1 %
HCT: 45.9 % (ref 39.0–52.0)
Hemoglobin: 14.4 g/dL (ref 13.0–17.0)
Immature Granulocytes: 1 %
Lymphocytes Relative: 18 %
Lymphs Abs: 1.9 10*3/uL (ref 0.7–4.0)
MCH: 29.8 pg (ref 26.0–34.0)
MCHC: 31.4 g/dL (ref 30.0–36.0)
MCV: 95 fL (ref 80.0–100.0)
Monocytes Absolute: 0.7 10*3/uL (ref 0.1–1.0)
Monocytes Relative: 6 %
Neutro Abs: 8 10*3/uL — ABNORMAL HIGH (ref 1.7–7.7)
Neutrophils Relative %: 74 %
Platelets: 217 10*3/uL (ref 150–400)
RBC: 4.83 MIL/uL (ref 4.22–5.81)
RDW: 14.5 % (ref 11.5–15.5)
WBC: 10.7 10*3/uL — ABNORMAL HIGH (ref 4.0–10.5)
nRBC: 0 % (ref 0.0–0.2)

## 2022-09-18 LAB — I-STAT ARTERIAL BLOOD GAS, ED
Acid-base deficit: 3 mmol/L — ABNORMAL HIGH (ref 0.0–2.0)
Bicarbonate: 23.8 mmol/L (ref 20.0–28.0)
Calcium, Ion: 1.21 mmol/L (ref 1.15–1.40)
HCT: 47 % (ref 39.0–52.0)
Hemoglobin: 16 g/dL (ref 13.0–17.0)
O2 Saturation: 95 %
Patient temperature: 98.6
Potassium: 5.2 mmol/L — ABNORMAL HIGH (ref 3.5–5.1)
Sodium: 131 mmol/L — ABNORMAL LOW (ref 135–145)
TCO2: 25 mmol/L (ref 22–32)
pCO2 arterial: 49.2 mmHg — ABNORMAL HIGH (ref 32–48)
pH, Arterial: 7.292 — ABNORMAL LOW (ref 7.35–7.45)
pO2, Arterial: 85 mmHg (ref 83–108)

## 2022-09-18 LAB — ACETAMINOPHEN LEVEL: Acetaminophen (Tylenol), Serum: <10 ug/mL — ABNORMAL LOW (ref 10–30)

## 2022-09-18 LAB — COMPREHENSIVE METABOLIC PANEL
ALT: 18 U/L (ref 0–44)
AST: 22 U/L (ref 15–41)
Albumin: 3.4 g/dL — ABNORMAL LOW (ref 3.5–5.0)
Alkaline Phosphatase: 71 U/L (ref 38–126)
Anion gap: 7 (ref 5–15)
BUN: 19 mg/dL (ref 8–23)
CO2: 25 mmol/L (ref 22–32)
Calcium: 8.5 mg/dL — ABNORMAL LOW (ref 8.9–10.3)
Chloride: 102 mmol/L (ref 98–111)
Creatinine, Ser: 1.53 mg/dL — ABNORMAL HIGH (ref 0.61–1.24)
GFR, Estimated: 50 mL/min — ABNORMAL LOW (ref >60)
Glucose, Bld: 158 mg/dL — ABNORMAL HIGH (ref 70–99)
Potassium: 4.9 mmol/L (ref 3.5–5.1)
Sodium: 134 mmol/L — ABNORMAL LOW (ref 135–145)
Total Bilirubin: 0.3 mg/dL (ref 0.3–1.2)
Total Protein: 5.9 g/dL — ABNORMAL LOW (ref 6.5–8.1)

## 2022-09-18 LAB — URINALYSIS, ROUTINE W REFLEX MICROSCOPIC
Bilirubin Urine: NEGATIVE
Glucose, UA: 150 mg/dL — AB
Hgb urine dipstick: NEGATIVE
Ketones, ur: NEGATIVE mg/dL
Leukocytes,Ua: NEGATIVE
Nitrite: NEGATIVE
Protein, ur: 30 mg/dL — AB
Specific Gravity, Urine: 1.021 (ref 1.005–1.030)
pH: 5 (ref 5.0–8.0)

## 2022-09-18 LAB — ETHANOL: Alcohol, Ethyl (B): <10 mg/dL (ref <10)

## 2022-09-18 LAB — TROPONIN I (HIGH SENSITIVITY): Troponin I (High Sensitivity): 5 ng/L (ref <18)

## 2022-09-18 LAB — SALICYLATE LEVEL: Salicylate Lvl: <7 mg/dL — ABNORMAL LOW (ref 7.0–30.0)

## 2022-09-18 LAB — CBG MONITORING, ED: Glucose-Capillary: 137 mg/dL — ABNORMAL HIGH (ref 70–99)

## 2022-09-18 MED ORDER — NALOXONE HCL 2 MG/2ML IJ SOSY
1.0000 mg | PREFILLED_SYRINGE | Freq: Once | INTRAMUSCULAR | Status: AC
  Administered 2022-09-18: 1 mg via INTRAVENOUS
  Filled 2022-09-18: qty 2

## 2022-09-18 MED ORDER — IOHEXOL 350 MG/ML SOLN
100.0000 mL | Freq: Once | INTRAVENOUS | Status: AC | PRN
  Administered 2022-09-18: 70 mL via INTRAVENOUS

## 2022-09-18 MED ORDER — NALOXONE HCL 2 MG/2ML IJ SOSY
2.0000 mg | PREFILLED_SYRINGE | Freq: Once | INTRAMUSCULAR | Status: AC
  Administered 2022-09-18: 2 mg via INTRAVENOUS

## 2022-09-18 MED ORDER — NALOXONE HCL 4 MG/0.1ML NA LIQD: NASAL | 3 refills | Status: AC

## 2022-09-18 MED ORDER — GLUCAGON HCL RDNA (DIAGNOSTIC) 1 MG IJ SOLR
1.0000 mg | Freq: Once | INTRAMUSCULAR | Status: AC
  Administered 2022-09-18: 1 mg via INTRAVENOUS
  Filled 2022-09-18: qty 1

## 2022-09-18 MED ORDER — NALOXONE HCL 2 MG/2ML IJ SOSY
PREFILLED_SYRINGE | INTRAMUSCULAR | Status: AC
  Filled 2022-09-18: qty 2

## 2022-09-18 MED ORDER — ONDANSETRON HCL 4 MG/2ML IJ SOLN
4.0000 mg | Freq: Once | INTRAMUSCULAR | Status: AC
  Administered 2022-09-18: 4 mg via INTRAVENOUS
  Filled 2022-09-18: qty 2

## 2022-09-18 MED ORDER — NALOXONE HCL 2 MG/2ML IJ SOSY
2.0000 mg | PREFILLED_SYRINGE | Freq: Once | INTRAMUSCULAR | Status: AC
  Administered 2022-09-18: 2 mg via INTRAVENOUS
  Filled 2022-09-18: qty 2

## 2022-09-18 NOTE — ED Provider Notes (Addendum)
[facility] EMERGENCY DEPARTMENT Provider Note   CSN: [id number] Arrival date & time: 09/18/22  0443     History  Chief Complaint  Patient presents with   Altered Mental Status    George Leon is a 66 y.o. male.  The history is provided by the patient.  Altered Mental Status Presenting symptoms: partial responsiveness   Severity:  Severe Most recent episode:  Today Episode history:  Single Timing:  Constant Progression:  Unchanged Chronicity:  New Context: not alcohol use and not dementia   Associated symptoms: no bladder incontinence, no depression, no fever and no slurred speech   Patient on vicodin at home for chronic pain presents with AMS from home.  Patient received narcan by EMS with no response per EMS report.  Patient admits to taking 3 at one time for pain.       Home Medications Prior to Admission medications   Medication Sig Start Date End Date Taking? Authorizing Provider  acetaminophen (TYLENOL) 325 MG tablet Take 2 tablets (650 mg total) by mouth every 4 (four) hours as needed for headache or mild pain. 07/05/21   Ezequiel Essex, MD  albuterol (VENTOLIN HFA) 108 (90 Base) MCG/ACT inhaler USE 1 TO 2 INHALATIONS EVERY 4 HOURS AS NEEDED FOR SHORTNESS OF BREATH/WHEEZE/COUGH. 08/29/22   Alen Bleacher, MD  amLODipine (NORVASC) 5 MG tablet Take 1 tablet (5 mg total) by mouth daily. 04/10/22   Alen Bleacher, MD  aspirin EC 81 MG tablet Take 1 tablet (81 mg total) by mouth daily. Swallow whole. 09/16/22   Tolia, Sunit, DO  atorvastatin (LIPITOR) 80 MG tablet Take 1 tablet (80 mg total) by mouth daily. 07/19/22   Alen Bleacher, MD  diclofenac Sodium (VOLTAREN) 1 % GEL Apply 2 g topically 4 (four) times daily. 09/08/22   Alen Bleacher, MD  DULoxetine (CYMBALTA) 60 MG capsule Take 1 capsule (60 mg total) by mouth daily. 07/19/22   Alen Bleacher, MD  Elastic Bandages & Supports ([place]) [place] 1 each by Does not apply route once.  06/19/15   Patrecia Pour, MD  empagliflozin (JARDIANCE) 10 MG TABS tablet Take 1 tablet (10 mg total) by mouth daily before breakfast. 08/05/22   Tolia, Sunit, DO  esomeprazole (NEXIUM) 40 MG capsule TAKE 1 CAPSULE DAILY AT 12 NOON 09/28/21   Alen Bleacher, MD  ezetimibe (ZETIA) 10 MG tablet Take 1 tablet (10 mg total) by mouth daily. 02/14/22   Alen Bleacher, MD  HYDROcodone-acetaminophen (NORCO) 7.5-325 MG tablet Take 1 tablet by mouth every 6 (six) hours as needed for moderate pain. 09/16/22   Alen Bleacher, MD  INCRUSE ELLIPTA 62.5 MCG/INH AEPB USE 1 INHALATION DAILY 07/20/21   Alen Bleacher, MD  metoprolol succinate (TOPROL-XL) 100 MG 24 hr tablet TAKE 1 TABLET (100 MG TOTAL) BY MOUTH EVERY MORNING. TAKE WITH OR IMMEDIATELY FOLLOWING A MEAL. 07/09/22 10/07/22  Alen Bleacher, MD  Multiple Vitamins-Minerals [facility] ADULT FORMULA) CAPS Take 1 capsule by mouth daily. 05/22/15   Patrecia Pour, MD  naloxone [facility]) 0.4 MG/ML injection Use as needed for signs of overdose. 01/16/21   Meccariello, Bernita Raisin, MD  nicotine ([facility] NICOTINE) 21 mg/24hr patch PLACE 1 PATCH ONTO SKIN DAILY 09/19/21   Alen Bleacher, MD  nystatin cream (MYCOSTATIN) APPLY TOPICALLY TWICE A DAY 04/05/21   Meccariello, Bernita Raisin, MD  sacubitril-valsartan (ENTRESTO) 49-51 MG Take 1 tablet by mouth 2 (two) times daily. 08/05/22   Tolia, Sunit, DO  selenium sulfide (SELSUN) 2.5 %  shampoo APPLY 1 APPLICATION TOPICALLY DAILY AS NEEDED FOR IRRITATION 09/27/21   Alen Bleacher, MD  sodium chloride (OCEAN) 0.65 % SOLN nasal spray Place 1 spray into both nostrils as needed for congestion.    [provider]      Allergies    Adhesive [tape], Hydrochlorothiazide, and Sulfamethoxazole    Review of Systems   Review of Systems  Unable to perform ROS: Mental status change  Constitutional:  Negative for fever.  Genitourinary:  Negative for bladder incontinence.    Physical Exam Updated Vital Signs BP 116/79   Pulse 96   Temp 98.2 F (36.8  C) (Oral)   Resp 19   Ht '5\' 7"'$  (1.702 m)   Wt 116.1 kg   SpO2 94%   BMI 40.10 kg/m  Physical Exam Constitutional:      General: He is not in acute distress.    Appearance: He is well-developed. He is not diaphoretic.  HENT:     Head: Normocephalic and atraumatic.     Nose: Nose normal.     Mouth/Throat:     Mouth: Mucous membranes are moist.     Pharynx: Oropharynx is clear.  Eyes:     Extraocular Movements: Extraocular movements intact.     Conjunctiva/sclera: Conjunctivae normal.     Comments: [place], responded to narcan when given   Cardiovascular:     Rate and Rhythm: Normal rate and regular rhythm.     Pulses: Normal pulses.     Heart sounds: Normal heart sounds.  Pulmonary:     Effort: Pulmonary effort is normal.     Breath sounds: Normal breath sounds. No wheezing or rales.  Abdominal:     General: Bowel sounds are normal.     Palpations: Abdomen is soft.     Tenderness: There is no abdominal tenderness. There is no guarding or rebound.  Musculoskeletal:        General: Normal range of motion.     Cervical back: Normal range of motion and neck supple.  Skin:    General: Skin is warm and dry.  Neurological:     General: No focal deficit present.     Mental Status: He is alert.     Deep Tendon Reflexes: Reflexes normal.     ED Results / Procedures / Treatments   Labs (all labs ordered are listed, but only abnormal results are displayed) Results for orders placed or performed during the hospital encounter of 09/18/22  CBG monitoring, ED  Result Value Ref Range   Glucose-Capillary 137 (H) 70 - 99 mg/dL  I-Stat arterial blood gas, ED [facility] ED only)  Result Value Ref Range   pH, Arterial 7.292 (L) 7.35 - 7.45   pCO2 arterial 49.2 (H) 32 - 48 mmHg   pO2, Arterial 85 83 - 108 mmHg   Bicarbonate 23.8 20.0 - 28.0 mmol/L   TCO2 25 22 - 32 mmol/L   O2 Saturation 95 %   Acid-base deficit 3.0 (H) 0.0 - 2.0 mmol/L   Sodium 131 (L) 135 - 145 mmol/L   Potassium 5.2  (H) 3.5 - 5.1 mmol/L   Calcium, Ion 1.21 1.15 - 1.40 mmol/L   HCT 47.0 39.0 - 52.0 %   Hemoglobin 16.0 13.0 - 17.0 g/dL   Patient temperature 98.6 F    Collection site RADIAL, ALLEN'S TEST ACCEPTABLE    Drawn by RT    Sample type ARTERIAL    DG Chest Portable 1 View  Result Date: 09/18/2022 CLINICAL DATA:  Altered mental status. EXAM: PORTABLE CHEST 1 VIEW COMPARISON:  07/05/2021. FINDINGS: 0454 hours. Opacity overlies the right apex, potentially related to patient rotation. No edema, pneumothorax or pleural effusion. Interstitial markings are diffusely coarsened with chronic features. Cardiopericardial silhouette is at upper limits of normal for size. Left permanent pacemaker again noted. Telemetry leads overlie the chest. IMPRESSION: Right apical opacity may be related to patient rotation although pneumonia or mass lesion could have this appearance. Follow-up dedicated PA and lateral upright chest x-ray recommended when patient is able. Electronically Signed   By: Misty Stanley M.D.   On: 09/18/2022 05:09    EKG  EKG Interpretation  Date/Time:  Wednesday September 18 2022 04:49:14 EDT Ventricular Rate:  94 PR Interval:  176 QRS Duration: 120 QT Interval:  362 QTC Calculation: 453 R Axis:   -53 Text Interpretation: Sinus rhythm IVCD, consider atypical RBBB Left ventricular hypertrophy Confirmed by Dory Horn) on 09/18/2022 6:24:20 AM        Radiology DG Chest Portable 1 View  Result Date: 09/18/2022 CLINICAL DATA:  Altered mental status. EXAM: PORTABLE CHEST 1 VIEW COMPARISON:  07/05/2021. FINDINGS: 0454 hours. Opacity overlies the right apex, potentially related to patient rotation. No edema, pneumothorax or pleural effusion. Interstitial markings are diffusely coarsened with chronic features. Cardiopericardial silhouette is at upper limits of normal for size. Left permanent pacemaker again noted. Telemetry leads overlie the chest. IMPRESSION: Right apical opacity may be  related to patient rotation although pneumonia or mass lesion could have this appearance. Follow-up dedicated PA and lateral upright chest x-ray recommended when patient is able. Electronically Signed   By: Misty Stanley M.D.   On: 09/18/2022 05:09    Procedures Procedures    Medications Ordered in ED Medications  naloxone [facility]) injection 2 mg (2 mg Intravenous Given 09/18/22 0508)  glucagon (human recombinant) (GLUCAGEN) injection 1 mg (1 mg Intravenous Given 09/18/22 0553)  ondansetron (ZOFRAN) injection 4 mg (4 mg Intravenous Given 09/18/22 0551)    ED Course/ Medical Decision Making/ A&P                           Medical Decision Making Patient with AMS from home  Amount and/or Complexity of Data Reviewed Independent Historian: EMS    Details: See above  External Data Reviewed: notes.    Details: Previous outpatient notes reviewed  Labs: ordered.    Details: All labs reviewed:  acidotic on ABG 7.2.  All labs pending first collection was lost.   Radiology: ordered and independent interpretation performed.    Details: No chf by me on CXR ECG/medicine tests: ordered and independent interpretation performed. Decision-making details documented in ED Course.  Risk Prescription drug management.  Critical Care Total time providing critical care: minutes (BIPAP and multiple reassessments)    Final Clinical Impression(s) / ED Diagnoses Final diagnoses:  None   Signed out to Dr. Pearline Cables for [place], Berdene Askari, MD 09/18/22 [id number]

## 2022-09-18 NOTE — ED Provider Notes (Addendum)
7:06 AM Patient signed out to me by previous ED physician. Pt is a 66 yo male presenting for decreased conciousness at home. Narcotic bottles found at bedside. Responded well to narcan in ED. Concerns for possible untintentional drug overdose. Pt hypoxic on arrival. Required bipap on arrival, no longer needed.   Labs pending  Physical Exam  BP 90/64 (BP Location: Right Arm)   Pulse 89   Temp 98.2 F (36.8 C) (Oral)   Resp 20   Ht '5\' 7"'$  (1.702 m)   Wt 116.1 kg   SpO2 94%   BMI 40.10 kg/m   Physical Exam Vitals and nursing note reviewed.  Constitutional:      General: He is not in acute distress.    Appearance: He is well-developed.  HENT:     Head: Normocephalic and atraumatic.  Eyes:     Conjunctiva/sclera: Conjunctivae normal.  Cardiovascular:     Rate and Rhythm: Regular rhythm. Tachycardia present.     Heart sounds: No murmur heard. Pulmonary:     Effort: Pulmonary effort is normal. No respiratory distress.     Breath sounds: Normal breath sounds.  Abdominal:     Palpations: Abdomen is soft.     Tenderness: There is no abdominal tenderness.  Musculoskeletal:        General: No swelling.     Cervical back: Neck supple.  Skin:    General: Skin is warm and dry.     Capillary Refill: Capillary refill takes less than 2 seconds.  Neurological:     Mental Status: He is alert.  Psychiatric:        Mood and Affect: Mood normal.     Procedures  .Critical Care  Performed by: Lianne Cure, DO Authorized by: Lianne Cure, DO   Critical care provider statement:    Critical care time (minutes):  30   Critical care was necessary to treat or prevent imminent or life-threatening deterioration of the following conditions: narcan use for respiratory distress.   Critical care was time spent personally by me on the following activities:  Development of treatment plan with patient or surrogate, discussions with consultants, evaluation of patient's response to treatment,  examination of patient, ordering and review of laboratory studies, ordering and review of radiographic studies, ordering and performing treatments and interventions, pulse oximetry, re-evaluation of patient's condition and review of old charts   ED Course / MDM    Medical Decision Making Amount and/or Complexity of Data Reviewed Labs: ordered. Radiology: ordered.  Risk Prescription drug management.   10:33 AM Patient has been observed in ED for over 6 hours. He orginally responded with decreased responsiveness and decreased respiratory effort. He responded well to narcan. He originally required bipap for hypoxia but is no longer hypoxic at this time.  Currently he is sating at 92% on room air.   Workup today included stable ECG, troponins, and electrolytes. There was no signs or symptoms of sepsis. He had a questionable CXR. CT chest with contrast for PE protocol was ordered and demonstrates clear lungs. No focal opacities. No masses. No pulmonary embolism.   At this time patient is completely awake, alert, following commands, sitting up in bed, asking "where is my phone?". I spoke with her wife who states her son and her have been concerned for narcotic overuse over the last several weeks. She states her son has decided to be in charge of the patient's narcotics moving forward to ensure that he does not intentionally overdose. Patient  denies any depression, suicidal thoughts, or hx of suicide attempt. He states that he gets tired and confused after taking Norco and sometimes takes 3-4 at a time. I also spoke with the wife regarding Narcan use. She states she has a prescription at the house but is for IV/IM use and she does not know how to use a syringe. I have ordered the family Narcan Nasal Spray with 3 refills.   Pt also has an AKI. UA demonstrates no UTI. Hx of DM. Pt denies flank pain. Admits to dehydration. Encouraged to increase fluid hydration and follow closely with primary care for  recheck of kidney function in 5-7 days.   Patient in no distress and overall condition improved here in the ED. Detailed discussions were had with the patient regarding current findings, and need for close f/u with PCP or on call doctor. The patient has been instructed to return immediately if the symptoms worsen in any way for re-evaluation. Patient verbalized understanding and is in agreement with current care plan. All questions answered prior to discharge.     Lianne Cure, DO 79/48/01 1039     Patient instructions at discharge: Today you had a full workup for decreased responsiveness and decreased respiratory (breathing) effort. From what we can find, how you presented to the ED, and how you responded to narcotic reversal medication we believe that you had an unintentional overdose of your home Norco (hydrocodone) prescription. Please only take 1 pill every 4-6 hours for pain. Do not take more then the prescribed amount. This could be life threatening and result in decreased breathing effort and death. I spoke with your wife who states you have Narcan at the house but it is an IV dose and she does not know how to administer it. I have ordered you a nasal spray version to your [place]. Have this out on a table and in a easy to find location incase your family members need to use it to save your life.   You were also found to have worsening kidney function. This may be due to your baseline diabetes. Urine today demonstrates no urinary tract infection. You admitted to dehydration. Please drink more water and follow closely with primary care for recheck of kidney function in 5-7 days.   I have ordered you an outpatient MRI for your chronic back pain as requested. Please call [phone] to schedule a date/time. Follow with your primary care provider for results.       Campbell Stall P, DO 78/67/54 1045

## 2022-09-18 NOTE — ED Triage Notes (Signed)
Pt BIB [facility] from home per family c/o AMS. Pt does take Norco 7.5-325 MG for chronic back pain. Pt admits to taking 3 at home. Per family they gave a dose of Narcan with no improvement. Per EMS pt can answer questions and was negative for stroke screen.

## 2022-09-18 NOTE — Discharge Instructions (Addendum)
Today you had a full workup for decreased responsiveness and decreased respiratory (breathing) effort. From what we can find, how you presented to the ED, and how you responded to narcotic reversal medication we believe that you had an unintentional overdose of your home Norco (hydrocodone) prescription. Please only take 1 pill every 4-6 hours for pain. Do not take more then the prescribed amount. This could be life threatening and result in decreased breathing effort and death. I spoke with your wife who states you have Narcan at the house but it is an IV dose and she does not know how to administer it. I have ordered you a nasal spray version to your [place]. Have this out on a table and in a easy to find location incase your family members need to use it to save your life.   You were also found to have worsening kidney function. This may be due to your baseline diabetes. Urine today demonstrates no urinary tract infection. You admitted to dehydration. Please drink more water and follow closely with primary care for recheck of kidney function in 5-7 days.   I have ordered you an outpatient MRI for your chronic back pain as requested. Please call [phone] to schedule a date/time. Follow with your primary care provider for results.

## 2022-09-19 ENCOUNTER — Telehealth: Payer: Self-pay

## 2022-09-19 NOTE — Patient Outreach (Addendum)
  Care Coordination TOC Note Transition Care Management Follow-up Telephone Call Date of discharge and from where: [facility] 09/18/22 How have you been since you were released from the hospital? "I still feel a bit off, but feeling better." "My wife worries too much and called you this morning". Explained to patient that this writer did not receive a call from his wife and I was just checking on him after his ED visit. Any questions or concerns? No  Items Reviewed: Did the pt receive and understand the discharge instructions provided? Yes  Medications obtained and verified? Yes  Other? No  Any new allergies since your discharge? No  Dietary orders reviewed? No Do you have support at home? Yes   Home Care and Equipment/Supplies: Were home health services ordered? no If so, what is the name of the agency? N/A  Has the agency set up a time to come to the patient's home? not applicable Were any new equipment or medical supplies ordered?  No What is the name of the medical supply agency? N/A Were you able to get the supplies/equipment? not applicable Do you have any questions related to the use of the equipment or supplies? No  Functional Questionnaire: (I = Independent and D = Dependent) ADLs: I  Bathing/Dressing- I  Meal Prep- I  Eating- I  Maintaining continence- I  Transferring/Ambulation- I  Managing Meds- I  Follow up appointments reviewed:  PCP Hospital f/u appt confirmed? No   Specialist Hospital f/u appt confirmed? No   Are transportation arrangements needed? No  If their condition worsens, is the pt aware to call PCP or go to the Emergency Dept.? Yes Was the patient provided with contact information for the PCP's office or ED? Yes Was to pt encouraged to call back with questions or concerns? Yes  SDOH assessments and interventions completed:   Yes  Care Coordination Interventions Activated:  Yes   Care Coordination Interventions:  No Care Coordination  interventions needed at this time.   Encounter Outcome:  Pt. Visit Completed

## 2022-09-23 NOTE — Progress Notes (Deleted)
    SUBJECTIVE:   CHIEF COMPLAINT / HPI:   Patient is a 66 year old male presenting today for check up Was recently seen in the ED for AMS concerning for possible pain medication overdose  Patient is  on [place] 7-325 for chronuc pain. His poain is generally *** He reports doing well and remaining atable since d/c from the ED. Mentation is stable and at baseline.  Concerns for AKI Work up in the ED include CMP which showed Cr of 1.53 Concerns for possible dehydration at the time.   PERTINENT  PMH / PSH: ***  OBJECTIVE:   There were no vitals taken for this visit.  ***  Physical Exam General: Alert, well appearing, NAD, Oriented x4 Cardiovascular: RRR, No Murmurs, Normal S2/S2 Respiratory: CTAB, No wheezing or Rales Abdomen: No distension or tenderness Extremities: No edema on extremities   Skin: Warm and dry  ASSESSMENT/PLAN:   No problem-specific Assessment & Plan notes found for this encounter.     Alen Bleacher, MD [place]   {    This will disappear when note is signed, click to select method of visit    :1}

## 2022-09-24 ENCOUNTER — Emergency Department (HOSPITAL_COMMUNITY): Payer: No Typology Code available for payment source

## 2022-09-24 ENCOUNTER — Ambulatory Visit: Payer: Medicare Other | Admitting: Student

## 2022-09-24 ENCOUNTER — Encounter (HOSPITAL_COMMUNITY): Payer: Self-pay | Admitting: Oncology

## 2022-09-24 ENCOUNTER — Emergency Department (HOSPITAL_COMMUNITY)
Admission: EM | Admit: 2022-09-24 | Discharge: 2022-09-24 | Disposition: A | Discharge status: Home / Self Care | Payer: No Typology Code available for payment source | Attending: Emergency Medicine | Admitting: Emergency Medicine

## 2022-09-24 ENCOUNTER — Other Ambulatory Visit: Payer: Self-pay

## 2022-09-24 DIAGNOSIS — R55 Syncope and collapse: Secondary | ICD-10-CM | POA: Diagnosis not present

## 2022-09-24 DIAGNOSIS — R519 Headache, unspecified: Secondary | ICD-10-CM | POA: Diagnosis not present

## 2022-09-24 DIAGNOSIS — Y92410 Unspecified street and highway as the place of occurrence of the external cause: Secondary | ICD-10-CM | POA: Diagnosis not present

## 2022-09-24 DIAGNOSIS — M25552 Pain in left hip: Secondary | ICD-10-CM | POA: Insufficient documentation

## 2022-09-24 DIAGNOSIS — J320 Chronic maxillary sinusitis: Secondary | ICD-10-CM | POA: Diagnosis not present

## 2022-09-24 DIAGNOSIS — M542 Cervicalgia: Secondary | ICD-10-CM | POA: Insufficient documentation

## 2022-09-24 DIAGNOSIS — R0902 Hypoxemia: Secondary | ICD-10-CM | POA: Diagnosis not present

## 2022-09-24 DIAGNOSIS — M47816 Spondylosis without myelopathy or radiculopathy, lumbar region: Secondary | ICD-10-CM | POA: Diagnosis not present

## 2022-09-24 DIAGNOSIS — Z7982 Long term (current) use of aspirin: Secondary | ICD-10-CM | POA: Insufficient documentation

## 2022-09-24 DIAGNOSIS — M4802 Spinal stenosis, cervical region: Secondary | ICD-10-CM | POA: Diagnosis not present

## 2022-09-24 DIAGNOSIS — S0990XA Unspecified injury of head, initial encounter: Secondary | ICD-10-CM | POA: Diagnosis not present

## 2022-09-24 DIAGNOSIS — M545 Low back pain, unspecified: Secondary | ICD-10-CM | POA: Diagnosis not present

## 2022-09-24 DIAGNOSIS — I10 Essential (primary) hypertension: Secondary | ICD-10-CM | POA: Diagnosis not present

## 2022-09-24 DIAGNOSIS — M25551 Pain in right hip: Secondary | ICD-10-CM | POA: Insufficient documentation

## 2022-09-24 DIAGNOSIS — Z041 Encounter for examination and observation following transport accident: Secondary | ICD-10-CM | POA: Diagnosis not present

## 2022-09-24 MED ORDER — MORPHINE SULFATE (PF) 4 MG/ML IV SOLN
4.0000 mg | Freq: Once | INTRAVENOUS | Status: AC
  Administered 2022-09-24: 4 mg via INTRAMUSCULAR
  Filled 2022-09-24: qty 1

## 2022-09-24 MED ORDER — OXYCODONE-ACETAMINOPHEN 5-325 MG PO TABS: 1.0000 | ORAL_TABLET | Freq: Three times a day (TID) | ORAL | 0 refills | Status: DC | PRN

## 2022-09-24 NOTE — Discharge Instructions (Signed)
Return for any problem.  ?

## 2022-09-24 NOTE — ED Notes (Signed)
Could not clear c-spine with pt having back pain since he was not on spine board and sitting up PTA. C-collar was intact upon arrival. Pt place in room to further evaluate back pain.
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
## 2022-09-25 ENCOUNTER — Telehealth: Payer: Self-pay
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
## 2022-09-27 ENCOUNTER — Encounter: Payer: Self-pay | Admitting: Student

## 2022-09-27 ENCOUNTER — Ambulatory Visit (INDEPENDENT_AMBULATORY_CARE_PROVIDER_SITE_OTHER): Payer: Medicare Other | Admitting: Student

## 2022-09-27 VITALS — BP 135/96 | HR 96 | Wt 260.6 lb

## 2022-09-27 DIAGNOSIS — Z23 Encounter for immunization: Secondary | ICD-10-CM

## 2022-09-27 DIAGNOSIS — Z0000 Encounter for general adult medical examination without abnormal findings: Secondary | ICD-10-CM | POA: Diagnosis not present
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
## 2022-10-01 DIAGNOSIS — I442 Atrioventricular block, complete: Secondary | ICD-10-CM | POA: Diagnosis not present

## 2022-10-01 DIAGNOSIS — Z45018 Encounter for adjustment and management of other part of cardiac pacemaker: Secondary | ICD-10-CM | POA: Diagnosis not present

## 2022-10-07 ENCOUNTER — Encounter: Payer: Medicare Other | Admitting: Cardiology

## 2022-10-09 ENCOUNTER — Other Ambulatory Visit: Payer: Self-pay | Admitting: Student

## 2022-10-09 DIAGNOSIS — I5032 Chronic diastolic (congestive) heart failure: Secondary | ICD-10-CM

## 2022-10-15 ENCOUNTER — Other Ambulatory Visit: Payer: Self-pay | Admitting: Student

## 2022-10-15 DIAGNOSIS — G894 Chronic pain syndrome: Secondary | ICD-10-CM

## 2022-10-15 DIAGNOSIS — G8929 Other chronic pain: Secondary | ICD-10-CM

## 2022-10-16 ENCOUNTER — Other Ambulatory Visit: Payer: Self-pay | Admitting: Student

## 2022-10-16 ENCOUNTER — Encounter: Payer: Self-pay | Admitting: Cardiology

## 2022-10-16 ENCOUNTER — Ambulatory Visit: Payer: Medicare Other | Admitting: Cardiology

## 2022-10-16 ENCOUNTER — Encounter: Payer: Self-pay | Admitting: Student

## 2022-10-16 DIAGNOSIS — Z45018 Encounter for adjustment and management of other part of cardiac pacemaker: Secondary | ICD-10-CM

## 2022-10-16 DIAGNOSIS — Z950 Presence of cardiac pacemaker: Secondary | ICD-10-CM

## 2022-10-16 DIAGNOSIS — I442 Atrioventricular block, complete: Secondary | ICD-10-CM | POA: Diagnosis not present

## 2022-10-16 DIAGNOSIS — G8929 Other chronic pain: Secondary | ICD-10-CM

## 2022-10-16 DIAGNOSIS — G894 Chronic pain syndrome: Secondary | ICD-10-CM

## 2022-10-16 MED ORDER — HYDROCODONE-ACETAMINOPHEN 7.5-325 MG PO TABS: 1.0000 | ORAL_TABLET | Freq: Four times a day (QID) | ORAL | 0 refills | Status: DC | PRN

## 2022-10-16 NOTE — Progress Notes (Signed)
Chief Complaint  Patient presents with   Pacemaker Check      ICD-10-CM   1. Encounter for care of pacemaker  Z45.018     2. Pacemaker Biotronik [place]ora dual chamber transmission 07/04/2021  Z95.0     3. Complete heart block (HCC)  I44.2       Remote pacemaker transmission 10/02/2022: AP0%, VP 100%.  Longevity battery life 85%.  Lead impedance and thresholds are normal.  2 Brief NSVT episodes.  Normal Function.  Unscheduled (Alert) 09/15/22: NSVT 6 S  Scheduled  In office pacemaker check 10/16/22  Single (S)/Dual (D)/BV: D. Presenting ASVP @ 80/min. Pacemaker dependant:  Yes. Underlying Yes, VPR @ 30/min. AP 2%, VP 100%.  AMS Episodes 0.   HVR 2. Longest 6 sec, 17 beat run. Latest 09/14/22. Thoracic impedance: At baseline and does not suggest fluid overload state. Longevity 6 Years 8 months. Magnet rate: >85%. Lead measurements: Stable. Histogram: Low (L)/normal (N)/high (H)  Normal. Patient activity 5-10%/day.   Observations: Normal pacemaker function. Changes: None.  Patient appeared to have had altered mental status about a month ago and was attributed to overdose of his medications however patient has been very compliant and it was felt that his symptoms were related to probable dehydration, probable intestinal microorganism or blood.  Advised him to follow-up with his PCP to discuss medications to avoid altered mental status in future, we will also set him up to see his back in 6 weeks for follow-up of chronic diastolic heart failure.

## 2022-11-13 ENCOUNTER — Other Ambulatory Visit: Payer: Self-pay | Admitting: Student

## 2022-11-13 DIAGNOSIS — G8929 Other chronic pain: Secondary | ICD-10-CM

## 2022-11-13 DIAGNOSIS — G894 Chronic pain syndrome: Secondary | ICD-10-CM

## 2022-11-13 MED ORDER — HYDROCODONE-ACETAMINOPHEN 7.5-325 MG PO TABS: 1.0000 | ORAL_TABLET | Freq: Four times a day (QID) | ORAL | 0 refills | Status: DC | PRN

## 2022-11-26 ENCOUNTER — Ambulatory Visit: Payer: Medicare Other | Admitting: Cardiology

## 2022-12-06 ENCOUNTER — Ambulatory Visit (INDEPENDENT_AMBULATORY_CARE_PROVIDER_SITE_OTHER): Payer: Medicare Other

## 2022-12-06 VITALS — Ht 69.0 in | Wt 260.0 lb

## 2022-12-06 DIAGNOSIS — Z0000 Encounter for general adult medical examination without abnormal findings: Secondary | ICD-10-CM

## 2022-12-06 DIAGNOSIS — Z0101 Encounter for examination of eyes and vision with abnormal findings: Secondary | ICD-10-CM

## 2022-12-06 DIAGNOSIS — Z87891 Personal history of nicotine dependence: Secondary | ICD-10-CM

## 2022-12-06 NOTE — Progress Notes (Signed)
I connected with  George Leon on 12/06/22 by a audio enabled telemedicine application and verified that I am speaking with the correct person using two identifiers.  Patient Location: Home  Provider Location: Home Office  I discussed the limitations of evaluation and management by telemedicine. The patient expressed understanding and agreed to proceed. Subjective:   George Leon is a 66 y.o. male who presents for an Initial Medicare Annual Wellness Visit.  Review of Systems    Defer to provider       Objective:    Today's Vitals   12/06/22 0812  Weight: 260 lb (117.9 kg)  Height: '5\' 9"'$  (1.753 m)   Body mass index is 38.4 kg/m.     12/06/2022   10:40 AM 09/27/2022    9:28 AM 09/24/2022   10:45 AM 05/29/2022    2:17 PM 01/31/2022    1:45 PM 10/01/2021    2:00 PM 07/23/2021   11:05 AM  Advanced Directives  Does Patient Have a Medical Advance Directive? No No No No No No No  Would patient like information on creating a medical advance directive? No - Patient declined  No - Patient declined No - Patient declined No - Patient declined No - Patient declined No - Patient declined    Current Medications (verified) Outpatient Encounter Medications as of 12/06/2022  Medication Sig   acetaminophen (TYLENOL) 325 MG tablet Take 2 tablets (650 mg total) by mouth every 4 (four) hours as needed for headache or mild pain.   albuterol (VENTOLIN HFA) 108 (90 Base) MCG/ACT inhaler USE 1 TO 2 INHALATIONS EVERY 4 HOURS AS NEEDED FOR SHORTNESS OF BREATH/WHEEZE/COUGH.   amLODipine (NORVASC) 5 MG tablet Take 1 tablet (5 mg total) by mouth daily.   aspirin EC 81 MG tablet Take 1 tablet (81 mg total) by mouth daily. Swallow whole.   atorvastatin (LIPITOR) 80 MG tablet Take 1 tablet (80 mg total) by mouth daily.   diclofenac Sodium (VOLTAREN) 1 % GEL Apply 2 g topically 4 (four) times daily.   DULoxetine (CYMBALTA) 60 MG capsule Take 1 capsule (60 mg total) by mouth daily.   Elastic  Bandages & Supports (MEDICAL COMPRESSION STOCKINGS) MISC 1 each by Does not apply route once.   empagliflozin (JARDIANCE) 10 MG TABS tablet Take 1 tablet (10 mg total) by mouth daily before breakfast.   esomeprazole (NEXIUM) 40 MG capsule TAKE 1 CAPSULE DAILY AT 12 NOON   ezetimibe (ZETIA) 10 MG tablet Take 1 tablet (10 mg total) by mouth daily.   HYDROcodone-acetaminophen (NORCO) 7.5-325 MG tablet Take 1 tablet by mouth every 6 (six) hours as needed for moderate pain.   INCRUSE ELLIPTA 62.5 MCG/INH AEPB USE 1 INHALATION DAILY   metoprolol succinate (TOPROL-XL) 100 MG 24 hr tablet TAKE 1 TABLET (100 MG TOTAL) BY MOUTH EVERY MORNING. TAKE WITH OR IMMEDIATELY FOLLOWING A MEAL.   Multiple Vitamins-Minerals (OCUVITE ADULT FORMULA) CAPS Take 1 capsule by mouth daily.   naloxone (NARCAN) nasal spray 4 mg/0.1 mL Please administered to nostrils for decreased responsiveness or decreased breathing effort   nicotine ([employer] NICOTINE) 21 mg/24hr patch PLACE 1 PATCH ONTO SKIN DAILY   nystatin cream (MYCOSTATIN) APPLY TOPICALLY TWICE A DAY   sacubitril-valsartan (ENTRESTO) 49-51 MG Take 1 tablet by mouth 2 (two) times daily.   selenium sulfide (SELSUN) 2.5 % shampoo APPLY 1 APPLICATION TOPICALLY DAILY AS NEEDED FOR IRRITATION   sodium chloride (OCEAN) 0.65 % SOLN nasal spray Place 1 spray into both nostrils as  needed for congestion.   Facility-Administered Encounter Medications as of 12/06/2022  Medication   diclofenac Sodium (VOLTAREN) 1 % topical gel 2 g    Allergies (verified) Adhesive [tape], Hydrochlorothiazide, and Sulfamethoxazole   History: Past Medical History:  Diagnosis Date   Acute MI ([place])    Acute on chronic diastolic heart failure ([place]) 12/01/2015   Anxiety    Arthritis    "qwhere" (08/06/2017)   CHF (congestive heart failure) ([place]) dx'd 2016   Chronic back pain    "all my back" (08/06/2017)   Chronic bronchitis ([facility])    Chronic pain    Chronic pain syndrome 12/01/2015   Complete  heart block ([facility])    COPD (chronic obstructive pulmonary disease) ([facility])    Dyspnea    w/ exertion    Dyspnea on exertion    Emphysema of lung ([place])    Encounter for care of pacemaker 07/04/2021   High cholesterol    High degree atrioventricular block    History of hiatal hernia    possible     Hypertension    Liver fatty degeneration    Macular degeneration, right eye    Pacemaker Biotronik [place] dual chamber transmission  07/04/2021   Pacemaker Dual chamber Biotronik [place] 8dr-T Mri - [id number] pacemaker 07/04/2021 07/04/2021   Pneumonia  1990s X 1; 2016   Past Surgical History:  Procedure Laterality Date   ANTERIOR CERVICAL DECOMP/DISCECTOMY FUSION  1988   "plate; cadavar bone; couple bolts"   BACK SURGERY     CARDIAC CATHETERIZATION     JOINT REPLACEMENT     KNEE ARTHROSCOPY Left 05/27/2017   Procedure: LEFT KNEE ARTHROSCOPY WITH PARTIAL MEDIAL MENISCECTOMY;  Surgeon: Mcarthur Rossetti, MD;  Location: [facility];  Service: Orthopedics;  Laterality: Left;   LEFT HEART CATH AND CORONARY ANGIOGRAPHY N/A 07/03/2021   Procedure: LEFT HEART CATH AND CORONARY ANGIOGRAPHY;  Surgeon: [name], MD;  Location: [facility];  Service: Cardiovascular;  Laterality: N/A;   PACEMAKER IMPLANT N/A 07/04/2021   Procedure: PACEMAKER IMPLANT;  Surgeon: Evans Lance, MD;  Location: [facility];  Service: Cardiovascular;  Laterality: N/A;   SHOULDER ARTHROSCOPY WITH ROTATOR CUFF REPAIR Right    TOTAL HIP ARTHROPLASTY Left 08/05/2017   Procedure: LEFT TOTAL HIP ARTHROPLASTY ANTERIOR APPROACH;  Surgeon: Mcarthur Rossetti, MD;  Location: [facility];  Service: Orthopedics;  Laterality: Left;   Family History  Problem Relation Age of Onset   Hypertension Father    AAA (abdominal aortic aneurysm) Father    Colon cancer Neg Hx    Esophageal cancer Neg Hx    Social History   Socioeconomic History   Marital status: Married    Spouse name: Not on file   Number of children: Not on file    Years of education: Not on file   Highest education level: Not on file  Occupational History   Not on file  Tobacco Use   Smoking status: Former    Packs/day: 1.00    Years: 45.00    Total pack years: 45.00    Types: Cigarettes   Smokeless tobacco: Former    Types: Nurse, children's Use: Never used  Substance and Sexual Activity   Alcohol use: Not Currently    Comment: 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   Drug use: Not Currently    Types: Marijuana    Comment: 08/06/2017 "last time was 07/2017; none in years before that"  Sexual activity: Never  Other Topics Concern   Not on file  Social History Narrative   Not on file   Social Determinants of Health   Financial Resource Strain: Low Risk  (12/06/2022)   Overall Financial Resource Strain (CARDIA)    Difficulty of Paying Living Expenses: Not very hard  Food Insecurity: No Food Insecurity (12/06/2022)   Hunger Vital Sign    Worried About Running Out of Food in the Last Year: Never true    Ran Out of Food in the Last Year: Never true  Transportation Needs: No Transportation Needs (12/06/2022)   PRAPARE - Hydrologist (Medical): No    Lack of Transportation (Non-Medical): No  Physical Activity: Inactive (12/06/2022)   Exercise Vital Sign    Days of Exercise per Week: 0 days    Minutes of Exercise per Session: 0 min  Stress: No Stress Concern Present (12/06/2022)   [place]    Feeling of Stress : Not at all  Social Connections: Socially Isolated (12/06/2022)   Social Connection and Isolation Panel [NHANES]    Frequency of Communication with Friends and Family: Once a week    Frequency of Social Gatherings with Friends and Family: Never    Attends Religious Services: Never    Marine scientist or Organizations: No    Attends Archivist Meetings: Never    Marital Status: Married    Tobacco  Counseling Counseling given: Not Answered   Clinical Intake:  Diabetic?no  Interpreter Needed?: No Activities of Daily Living    12/06/2022   10:43 AM  In your present state of health, do you have any difficulty performing the following activities:  Hearing? 1  Vision? 0  Difficulty concentrating or making decisions? 0  Walking or climbing stairs? 0  Dressing or bathing? 0  Doing errands, shopping? 0  Preparing Food and eating ? N  Using the Toilet? N  In the past six months, have you accidently leaked urine? N  Do you have problems with loss of bowel control? N  Managing your Medications? N  Managing your Finances? N  Housekeeping or managing your Housekeeping? N    Patient Care Team: Alen Bleacher, MD as PCP - General (Family Medicine) [facility]  Indicate any recent Medical Services you may have received from other than [facility] providers in the past year (date may be approximate).     Assessment:   This is a routine wellness examination for [place].  Hearing/Vision screen Hearing Screening - Comments:: Patient has concern with some hearing loss Vision Screening - Comments:: Wears glasses/needs to follow up/Dr Katy Fitch  Dietary issues and exercise activities discussed: Current Exercise Habits: The patient does not participate in regular exercise at present   Goals Addressed             This Visit's Progress    Exercise 3x per week (30 min per time)         Depression Screen    12/06/2022   10:39 AM 09/27/2022    9:28 AM 05/29/2022    2:17 PM 01/31/2022    1:45 PM 10/01/2021    2:00 PM 07/23/2021   11:05 AM 04/02/2021   10:33 AM  PHQ 2/9 Scores  PHQ - 2 Score '1 1 1 '$ 0 1 0 4  PHQ- 9 Score  '7 5 1 4 3 12    '$ Fall Risk    12/06/2022  10:43 AM 07/23/2021   11:05 AM 08/07/2020    9:31 AM 06/14/2020    3:52 PM 05/11/2020    9:49 AM  Fall Risk   Falls in the past year? 1 1 0 0 0  Comment  "near misses"     Number falls in past yr: 1 1 0 0 0   Injury with Fall? 0 0 0  0  Risk for fall due to :  Impaired balance/gait     Follow up    Falls evaluation completed     FALL RISK PREVENTION PERTAINING TO THE HOME:  Any stairs in or around the home? Yes  If so, are there any without handrails? No  Home free of loose throw rugs in walkways, pet beds, electrical cords, etc? Yes  Adequate lighting in your home to reduce risk of falls? Yes   ASSISTIVE DEVICES UTILIZED TO PREVENT FALLS:  Life alert? No  Use of a cane, walker or w/c? Yes  Grab bars in the bathroom? No  Shower chair or bench in shower? Yes  Elevated toilet seat or a handicapped toilet? No   Cognitive Function:        Immunizations Immunization History  Administered Date(s) Administered   Fluad Quad(high Dose 65+) 09/27/2022   Influenza Split 09/19/2011, 08/25/2012   Influenza,inj,Quad PF,6+ Mos 08/23/2013, 12/23/2014, 01/18/2016, 08/16/2016, 08/04/2017, 09/21/2018, 11/22/2019, 10/30/2020, 10/01/2021   PFIZER Comirnaty(Gray Top)Covid-19 Tri-Sucrose Vaccine 04/02/2021   PFIZER(Purple Top)SARS-COV-2 Vaccination 05/09/2020, 05/30/2020   PNEUMOCOCCAL CONJUGATE-20 07/23/2021   Pfizer Covid-19 Vaccine Bivalent Booster 38yr & up 10/01/2021   Pneumococcal Polysaccharide-23 10/16/2012, 11/16/2018   Td 06/13/2005   Tdap 09/15/2012    TDAP status: Due, Education has been provided regarding the importance of this vaccine. Advised may receive this vaccine at local pharmacy or Health Dept. Aware to provide a copy of the vaccination record if obtained from local pharmacy or Health Dept. Verbalized acceptance and understanding.  Flu Vaccine status: Up to date  Pneumococcal vaccine status: Up to date  Covid-19 vaccine status: Completed vaccines  Qualifies for Shingles Vaccine? Yes   Zostavax completed No   Shingrix Completed?: No.    Education has been provided regarding the importance of this vaccine. Patient has been advised to call insurance company to determine out  of pocket expense if they have not yet received this vaccine. Advised may also receive vaccine at local pharmacy or Health Dept. Verbalized acceptance and understanding.  Screening Tests Health Maintenance  Topic Date Due   Medicare Annual Wellness (AWV)  Never done   Zoster Vaccines- Shingrix (1 of 2) Never done   DTaP/Tdap/Td (3 - Td or Tdap) 09/15/2022   COVID-19 Vaccine (5 - 2023-24 season) 12/22/2022 (Originally 08/09/2022)   COLONOSCOPY (Pts [phone] coverage will need to be confirmed)  04/10/2023   Lung Cancer Screening  09/19/2023   Pneumonia Vaccine 6529Years old  Completed   INFLUENZA VACCINE  Completed   Hepatitis C Screening  Completed   HIV Screening  Completed   HPV VACCINES  Aged Out    Health Maintenance  Health Maintenance Due  Topic Date Due   Medicare Annual Wellness (AWV)  Never done   Zoster Vaccines- Shingrix (1 of 2) Never done   DTaP/Tdap/Td (3 - Td or Tdap) 09/15/2022    Colorectal cancer screening: Type of screening: Colonoscopy. Completed 04/09/2022. Repeat every 1 years  Lung Cancer Screening: (Low Dose CT Chest recommended if Age 65-80ears, 30 pack-year currently smoking OR have quit w/in 15years.)  does qualify.   Lung Cancer Screening Referral: placed  Additional Screening:  Hepatitis C Screening: does not qualify; Completed 07/14/2013  Vision Screening: Recommended annual ophthalmology exams for early detection of glaucoma and other disorders of the eye. Is the patient up to date with their annual eye exam?  No  Who is the provider or what is the name of the office in which the patient attends annual eye exams? [facility] If pt is not established with a provider, would they like to be referred to a provider to establish care? Yes .   Dental Screening: Recommended annual dental exams for proper oral hygiene  Community Resource Referral / Chronic Care Management: CRR required this visit?  No   CCM required this visit?  No      Plan:      I have personally reviewed and noted the following in the patient's chart:   Medical and social history Use of alcohol, tobacco or illicit drugs  Current medications and supplements including opioid prescriptions. Patient is not currently taking opioid prescriptions. Functional ability and status Nutritional status Physical activity Advanced directives List of other physicians Hospitalizations, surgeries, and ER visits in previous 12 months Vitals Screenings to include cognitive, depression, and falls Referrals and appointments  In addition, I have reviewed and discussed with patient certain preventive protocols, quality metrics, and best practice recommendations. A written personalized care plan for preventive services as well as general preventive health recommendations were provided to patient.     Amado Coe, [place]   12/06/2022   Nurse Notes: Lung cancer screening and ophthalmology referral placed.  Patient would like hearing test at next appointment, he will call to schedule. Needs script for TDaP vaccine.  Will provide at next visit.

## 2022-12-06 NOTE — Patient Instructions (Signed)
[name] , Thank you for taking time to come for your Medicare Wellness Visit. I appreciate your ongoing commitment to your health goals. Please review the following plan we discussed and let me know if I can assist you in the future.   These are the goals we discussed:  Goals      Blood Pressure < 140/90     Exercise 3x per week (30 min per time)     LDL CALC < 130     Quit smoking / using tobacco        This is a list of the screening recommended for you and due dates:  Health Maintenance  Topic Date Due   Medicare Annual Wellness Visit  Never done   Zoster (Shingles) Vaccine (1 of 2) Never done   DTaP/Tdap/Td vaccine (3 - Td or Tdap) 09/15/2022   COVID-19 Vaccine (5 - 2023-24 season) 12/22/2022*   Colon Cancer Screening  04/10/2023   Screening for Lung Cancer  09/19/2023   Pneumonia Vaccine  Completed   Flu Shot  Completed   Hepatitis C Screening: USPSTF Recommendation to screen - Ages 18-79 yo.  Completed   HIV Screening  Completed   HPV Vaccine  Aged Out  *Topic was postponed. The date shown is not the original due date.    Advanced directives: none   Next appointment: Follow up in one year for your annual wellness visit.   Preventive Care 65 Years and Older, Male  Preventive care refers to lifestyle choices and visits with your health care provider that can promote health and wellness. What does preventive care include? A yearly physical exam. This is also called an annual well check. Dental exams once or twice a year. Routine eye exams. Ask your health care provider how often you should have your eyes checked. Personal lifestyle choices, including: Daily care of your teeth and gums. Regular physical activity. Eating a healthy diet. Avoiding tobacco and drug use. Limiting alcohol use. Practicing safe sex. Taking low doses of aspirin every day. Taking vitamin and mineral supplements as recommended by your health care provider. What happens during an annual  well check? The services and screenings done by your health care provider during your annual well check will depend on your age, overall health, lifestyle risk factors, and family history of disease. Counseling  Your health care provider may ask you questions about your: Alcohol use. Tobacco use. Drug use. Emotional well-being. Home and relationship well-being. Sexual activity. Eating habits. History of falls. Memory and ability to understand (cognition). Work and work Statistician. Screening  You may have the following tests or measurements: Height, weight, and BMI. Blood pressure. Lipid and cholesterol levels. These may be checked every 5 years, or more frequently if you are over 77 years old. Skin check. Lung cancer screening. You may have this screening every year starting at age 33 if you have a 30-pack-year history of smoking and currently smoke or have quit within the past 15 years. Fecal occult blood test (FOBT) of the stool. You may have this test every year starting at age 36. Flexible sigmoidoscopy or colonoscopy. You may have a sigmoidoscopy every 5 years or a colonoscopy every 10 years starting at age 25. Prostate cancer screening. Recommendations will vary depending on your family history and other risks. Hepatitis C blood test. Hepatitis B blood test. Sexually transmitted disease (STD) testing. Diabetes screening. This is done by checking your blood sugar (glucose) after you have not eaten for a while (fasting).  You may have this done every 1-3 years. Abdominal aortic aneurysm (AAA) screening. You may need this if you are a current or former smoker. Osteoporosis. You may be screened starting at age 40 if you are at high risk. Talk with your health care provider about your test results, treatment options, and if necessary, the need for more tests. Vaccines  Your health care provider may recommend certain vaccines, such as: Influenza vaccine. This is recommended every  year. Tetanus, diphtheria, and acellular pertussis (Tdap, Td) vaccine. You may need a Td booster every 10 years. Zoster vaccine. You may need this after age 53. Pneumococcal 13-valent conjugate (PCV13) vaccine. One dose is recommended after age 46. Pneumococcal polysaccharide (PPSV23) vaccine. One dose is recommended after age 35. Talk to your health care provider about which screenings and vaccines you need and how often you need them. This information is not intended to replace advice given to you by your health care provider. Make sure you discuss any questions you have with your health care provider. Document Released: 12/22/2015 Document Revised: 08/14/2016 Document Reviewed: 09/26/2015 Elsevier Interactive Patient Education  2017 [employer] Prevention in the Home Falls can cause injuries. They can happen to people of all ages. There are many things you can do to make your home safe and to help prevent falls. What can I do on the outside of my home? Regularly fix the edges of walkways and driveways and fix any cracks. Remove anything that might make you trip as you walk through a door, such as a raised step or threshold. Trim any bushes or trees on the path to your home. Use bright outdoor lighting. Clear any walking paths of anything that might make someone trip, such as rocks or tools. Regularly check to see if handrails are loose or broken. Make sure that both sides of any steps have handrails. Any raised decks and porches should have guardrails on the edges. Have any leaves, snow, or ice cleared regularly. Use sand or salt on walking paths during winter. Clean up any spills in your garage right away. This includes oil or grease spills. What can I do in the bathroom? Use night lights. Install grab bars by the toilet and in the tub and shower. Do not use towel bars as grab bars. Use non-skid mats or decals in the tub or shower. If you need to sit down in the shower, use a  plastic, non-slip stool. Keep the floor dry. Clean up any water that spills on the floor as soon as it happens. Remove soap buildup in the tub or shower regularly. Attach bath mats securely with double-sided non-slip rug tape. Do not have throw rugs and other things on the floor that can make you trip. What can I do in the bedroom? Use night lights. Make sure that you have a light by your bed that is easy to reach. Do not use any sheets or blankets that are too big for your bed. They should not hang down onto the floor. Have a firm chair that has side arms. You can use this for support while you get dressed. Do not have throw rugs and other things on the floor that can make you trip. What can I do in the kitchen? Clean up any spills right away. Avoid walking on wet floors. Keep items that you use a lot in easy-to-reach places. If you need to reach something above you, use a strong step stool that has a grab bar. Keep  electrical cords out of the way. Do not use floor polish or wax that makes floors slippery. If you must use wax, use non-skid floor wax. Do not have throw rugs and other things on the floor that can make you trip. What can I do with my stairs? Do not leave any items on the stairs. Make sure that there are handrails on both sides of the stairs and use them. Fix handrails that are broken or loose. Make sure that handrails are as long as the stairways. Check any carpeting to make sure that it is firmly attached to the stairs. Fix any carpet that is loose or worn. Avoid having throw rugs at the top or bottom of the stairs. If you do have throw rugs, attach them to the floor with carpet tape. Make sure that you have a light switch at the top of the stairs and the bottom of the stairs. If you do not have them, ask someone to add them for you. What else can I do to help prevent falls? Wear shoes that: Do not have high heels. Have rubber bottoms. Are comfortable and fit you  well. Are closed at the toe. Do not wear sandals. If you use a stepladder: Make sure that it is fully opened. Do not climb a closed stepladder. Make sure that both sides of the stepladder are locked into place. Ask someone to hold it for you, if possible. Clearly mark and make sure that you can see: Any grab bars or handrails. First and last steps. Where the edge of each step is. Use tools that help you move around (mobility aids) if they are needed. These include: Canes. Walkers. Scooters. Crutches. Turn on the lights when you go into a dark area. Replace any light bulbs as soon as they burn out. Set up your furniture so you have a clear path. Avoid moving your furniture around. If any of your floors are uneven, fix them. If there are any pets around you, be aware of where they are. Review your medicines with your doctor. Some medicines can make you feel dizzy. This can increase your chance of falling. Ask your doctor what other things that you can do to help prevent falls. This information is not intended to replace advice given to you by your health care provider. Make sure you discuss any questions you have with your health care provider. Document Released: 09/21/2009 Document Revised: 05/02/2016 Document Reviewed: 12/30/2014 Elsevier Interactive Patient Education  2017 [employer].

## 2022-12-10 ENCOUNTER — Ambulatory Visit: Payer: Medicare Other | Admitting: Cardiology

## 2022-12-10 ENCOUNTER — Encounter: Payer: Self-pay | Admitting: Cardiology

## 2022-12-10 VITALS — BP 135/75 | HR 91 | Resp 16 | Ht 69.0 in | Wt 254.0 lb

## 2022-12-10 DIAGNOSIS — I5032 Chronic diastolic (congestive) heart failure: Secondary | ICD-10-CM | POA: Diagnosis not present

## 2022-12-10 DIAGNOSIS — I10 Essential (primary) hypertension: Secondary | ICD-10-CM | POA: Diagnosis not present

## 2022-12-10 DIAGNOSIS — I739 Peripheral vascular disease, unspecified: Secondary | ICD-10-CM

## 2022-12-10 DIAGNOSIS — I442 Atrioventricular block, complete: Secondary | ICD-10-CM | POA: Diagnosis not present

## 2022-12-10 DIAGNOSIS — Z950 Presence of cardiac pacemaker: Secondary | ICD-10-CM | POA: Diagnosis not present

## 2022-12-10 NOTE — Progress Notes (Signed)
Date:  12/10/2022   ID:  George Leon, DOB 1956/08/15, MRN [id number]  PCP:  Alen Bleacher, MD  Cardiologist:  Rex Kras, DO, [facility] (established care 07/02/2021)  Date: 12/10/22 Last Office Visit: 08/20/2021  Chief Complaint  Patient presents with   <9> Chronic heart failure with preserved ejection fraction    Follow-up    6 week    HPI  George Leon is a 67 y.o. male whose past medical history and cardiac risk factors include: Status post dual-chamber pacemaker for symptomatic bradycardia and complete heart block, HFpEF, hypertension, hyperlipidemia, chronic stage IIIa chronic kidney disease,  obesity due to excess calories, prior tobacco use, COPD.  Patient was referred to the practice for evaluation of possible HFpEF.  But during the initial office visit he was noted to be in second-degree type II AV block with episodes of high degree AV block as well.  He underwent pacemaker implantation with Dr. Lovena Le.  Subsequently left heart catheterization which noted no significant epicardial CAD.  He now presents for 40-monthfollow-up.   He has been compliant with all his medications, he has not had any recent hospitalizations.  States that since being on Entresto and also Jardiance his breathing is much improved.   ALLERGIES: Allergies  Allergen Reactions   Adhesive [Tape] Rash    Paper tape is what bothers him Pulled skin off also   Hydrochlorothiazide Other (See Comments)    Back Pain & gives him kidney stones   Sulfamethoxazole Swelling and Other (See Comments)    Tongue Swelling. Pt states he has had a sulfa drug since and has tolerated them.    MEDICATION LIST PRIOR TO VISIT: Current Meds  Medication Sig   acetaminophen (TYLENOL) 325 MG tablet Take 2 tablets (650 mg total) by mouth every 4 (four) hours as needed for headache or mild pain.   albuterol (VENTOLIN HFA) 108 (90 Base) MCG/ACT inhaler USE 1 TO 2 INHALATIONS EVERY 4 HOURS AS NEEDED FOR SHORTNESS OF  BREATH/WHEEZE/COUGH.   amLODipine (NORVASC) 5 MG tablet Take 1 tablet (5 mg total) by mouth daily.   aspirin EC 81 MG tablet Take 1 tablet (81 mg total) by mouth daily. Swallow whole.   atorvastatin (LIPITOR) 80 MG tablet Take 1 tablet (80 mg total) by mouth daily.   diclofenac Sodium (VOLTAREN) 1 % GEL Apply 2 g topically 4 (four) times daily.   DULoxetine (CYMBALTA) 60 MG capsule Take 1 capsule (60 mg total) by mouth daily.   Elastic Bandages & Supports (MEDICAL COMPRESSION STOCKINGS) MISC 1 each by Does not apply route once.   empagliflozin (JARDIANCE) 10 MG TABS tablet Take 1 tablet (10 mg total) by mouth daily before breakfast.   esomeprazole (NEXIUM) 40 MG capsule TAKE 1 CAPSULE DAILY AT 12 NOON   ezetimibe (ZETIA) 10 MG tablet Take 1 tablet (10 mg total) by mouth daily.   HYDROcodone-acetaminophen (NORCO) 7.5-325 MG tablet Take 1 tablet by mouth every 6 (six) hours as needed for moderate pain.   INCRUSE ELLIPTA 62.5 MCG/INH AEPB USE 1 INHALATION DAILY   metoprolol succinate (TOPROL-XL) 100 MG 24 hr tablet TAKE 1 TABLET (100 MG TOTAL) BY MOUTH EVERY MORNING. TAKE WITH OR IMMEDIATELY FOLLOWING A MEAL.   Multiple Vitamins-Minerals (OCUVITE ADULT FORMULA) CAPS Take 1 capsule by mouth daily.   naloxone (NARCAN) nasal spray 4 mg/0.1 mL Please administered to nostrils for decreased responsiveness or decreased breathing effort   nicotine ([employer] NICOTINE) 21 mg/24hr patch PLACE 1 PATCH ONTO SKIN DAILY  nystatin cream (MYCOSTATIN) APPLY TOPICALLY TWICE A DAY   sacubitril-valsartan (ENTRESTO) 49-51 MG Take 1 tablet by mouth 2 (two) times daily.   selenium sulfide (SELSUN) 2.5 % shampoo APPLY 1 APPLICATION TOPICALLY DAILY AS NEEDED FOR IRRITATION   sodium chloride (OCEAN) 0.65 % SOLN nasal spray Place 1 spray into both nostrils as needed for congestion.   Current Facility-Administered Medications for the 12/10/22 encounter (Office Visit) with [name], MD  Medication   diclofenac Sodium (VOLTAREN) 1  % topical gel 2 g     PAST MEDICAL HISTORY: Past Medical History:  Diagnosis Date   Acute MI ([place])    Acute on chronic diastolic heart failure ([place]) 12/01/2015   Anxiety    Arthritis    "qwhere" (08/06/2017)   CHF (congestive heart failure) ([place]) dx'd 2016   Chronic back pain    "all my back" (08/06/2017)   Chronic bronchitis (HCC)    Chronic pain    Chronic pain syndrome 12/01/2015   Complete heart block (HCC)    COPD (chronic obstructive pulmonary disease) (HCC)    Dyspnea    w/ exertion    Dyspnea on exertion    Emphysema of lung ([place])    Encounter for care of pacemaker 07/04/2021   High cholesterol    High degree atrioventricular block    History of hiatal hernia    possible     Hypertension    Liver fatty degeneration    Macular degeneration, right eye    Pacemaker Biotronik [place] dual chamber transmission  07/04/2021   Pacemaker Dual chamber Biotronik [place] 8dr-T Mri - [id number] pacemaker 07/04/2021 07/04/2021   Pneumonia  1990s X 1; 2016    PAST SURGICAL HISTORY: Past Surgical History:  Procedure Laterality Date   ANTERIOR CERVICAL DECOMP/DISCECTOMY FUSION  1988   "plate; cadavar bone; couple bolts"   BACK SURGERY     CARDIAC CATHETERIZATION     JOINT REPLACEMENT     KNEE ARTHROSCOPY Left 05/27/2017   Procedure: LEFT KNEE ARTHROSCOPY WITH PARTIAL MEDIAL MENISCECTOMY;  Surgeon: Mcarthur Rossetti, MD;  Location: [place];  Service: Orthopedics;  Laterality: Left;   LEFT HEART CATH AND CORONARY ANGIOGRAPHY N/A 07/03/2021   Procedure: LEFT HEART CATH AND CORONARY ANGIOGRAPHY;  Surgeon: [name], MD;  Location: [place] [facility];  Service: Cardiovascular;  Laterality: N/A;   PACEMAKER IMPLANT N/A 07/04/2021   Procedure: PACEMAKER IMPLANT;  Surgeon: Evans Lance, MD;  Location: [facility];  Service: Cardiovascular;  Laterality: N/A;   SHOULDER ARTHROSCOPY WITH ROTATOR CUFF REPAIR Right    TOTAL HIP ARTHROPLASTY Left 08/05/2017   Procedure: LEFT TOTAL HIP  ARTHROPLASTY ANTERIOR APPROACH;  Surgeon: Mcarthur Rossetti, MD;  Location: [place];  Service: Orthopedics;  Laterality: Left;    FAMILY HISTORY: The patient family history includes AAA (abdominal aortic aneurysm) in his father; Hypertension in his father.  SOCIAL HISTORY:  The patient  reports that he has been smoking cigarettes. He has a 11.25 pack-year smoking history. He quit smokeless tobacco use about 11 years ago.  His smokeless tobacco use included chew. He reports that he does not currently use alcohol. He reports that he does not currently use drugs after having used the following drugs: Marijuana.  REVIEW OF SYSTEMS: Review of Systems  Cardiovascular:  Negative for chest pain, dyspnea on exertion and leg swelling.    PHYSICAL EXAM:    12/10/2022    2:51 PM 12/06/2022    8:12 AM 09/27/2022  9:23 AM  Vitals with BMI  Height '5\' 9"'$  '5\' 9"'$    Weight 254 lbs 260 lbs 260 lbs 10 oz  BMI 37.49 83.15 17.61  Systolic 607  371  Diastolic 75  96  Pulse 91  96   Physical Exam Constitutional:      Appearance: He is obese.  Neck:     Vascular: No carotid bruit or JVD.  Cardiovascular:     Rate and Rhythm: Normal rate and regular rhythm.     Pulses:          Dorsalis pedis pulses are 1+ on the right side and 1+ on the left side.       Posterior tibial pulses are 0 on the right side and 0 on the left side.     Heart sounds: Normal heart sounds. No murmur heard.    No gallop.  Pulmonary:     Effort: Pulmonary effort is normal.     Breath sounds: Normal breath sounds.  Abdominal:     General: Bowel sounds are normal.     Palpations: Abdomen is soft.  Musculoskeletal:     Right lower leg: No edema.     Left lower leg: No edema.      CARDIAC DATABASE: EKG: 09/16/2022: Sinus rhythm, atrial sensed ventricular paced rhythm 75 bpm.  Echocardiogram: 07/03/2021: LVEF 60-65%, no regional wall motion abnormalities, mild LVH, grade 1 diastolic impairment, normal right  ventricular size and function, mild MR, right atrial pressure 15 mmHg.  Stress Testing: No results found for this or any previous visit from the past 1095 days.  Heart catheterization: Left Heart Catheterization 07/03/21: No significant CAD.  Moderately elevated EDP. LV: 125 mmHg / 12 mmHg, EDP 22 mmHg.  Aortic pressure 109/46 with a mean of 69 mmHg.  There was no pressure gradient across the aortic valve.  LVEDP moderately elevated.  LVEF 65%, no significant MR. LM: Large vessel, normal. LAD: Very large vessel, gives origin to 3 large diagonals, very mild disease is evident with mild calcification in the proximal segment. CX: Moderate sized vessel, smooth and normal. RCA: Very large vessel, PL branch is very large.  There is mild disease in the distal RCA.  Mild calcification is evident in the proximal segment. Recommendation: No reversible cause for heart block.  He will be scheduled for pacemaker implantation tomorrow.  Lower Extremity Arterial Duplex 08/10/2021:  Proximal to mid right SFA >50% stenosis with monophasic and dampened  waveform pattern. There is diffuse disease and monophasic waveform pattern in the right below knee small arteries.  No hemodynamically significant stenosis identified in the left lower extremity. There is diffuse plaque and severely abnormal monophasic waveform  in the small vessels below knee.  This exam reveals mildly decreased perfusion of the right lower extremity, noted at the anterior tibial artery level (ABI  0.90) and moderately decreased perfusion of the left lower extremity at the post tibial artery level (ABI 0.71).   Abdominal Aortic Duplex 08/10/2021:  The maximum aorta (sac) diameter is 2.06 cm (prox). No AAA observed. No evidence of significant  atherosclerotic plaque. Normal flow velocities noted in bilateral common iliac arteries.   Pacemaker Biotronik [place] dual chamber pacemaker implantation 07/04/2021   Remote pacemaker transmission  10/02/2022: AP0%, VP 100%.  Longevity battery life 85%.  Lead impedance and thresholds are normal.  2 Brief NSVT episodes.  Normal Function.  Scheduled  In office pacemaker check 10/16/22  Single (S)/Dual (D)/BV: D. Presenting ASVP @ 80/min. Pacemaker dependant:  Yes. Underlying Yes, VPR @ 30/min. AP 2%, VP 100%.  AMS Episodes 0.   HVR 2. Longest 6 sec, 17 beat run. Latest 09/14/22. Thoracic impedance: At baseline and does not suggest fluid overload state. Longevity 6 Years 8 months. Magnet rate: >85%. Lead measurements: Stable. Histogram: Low (L)/normal (N)/high (H)  Normal. Patient activity 5-10%/day.   Observations: Normal pacemaker function. Changes: None.  LABORATORY DATA:    Latest Ref Rng & Units 09/18/2022    6:28 AM 09/18/2022    5:20 AM 07/05/2021    3:26 AM  CBC  WBC 4.0 - 10.5 K/uL 10.7   12.5   Hemoglobin 13.0 - 17.0 g/dL 14.4  16.0  12.7   Hematocrit 39.0 - 52.0 % 45.9  47.0  39.1   Platelets 150 - 400 K/uL 217   197        Latest Ref Rng & Units 09/18/2022    6:28 AM 09/18/2022    5:20 AM 09/03/2021    2:36 PM  CMP  Glucose 70 - 99 mg/dL 158   98   BUN 8 - 23 mg/dL 19   9   Creatinine 0.61 - 1.24 mg/dL 1.53   0.92   Sodium 135 - 145 mmol/L 134  131  142   Potassium 3.5 - 5.1 mmol/L 4.9  5.2  4.7   Chloride 98 - 111 mmol/L 102   103   CO2 22 - 32 mmol/L 25   21   Calcium 8.9 - 10.3 mg/dL 8.5   9.0   Total Protein 6.5 - 8.1 g/dL 5.9     Total Bilirubin 0.3 - 1.2 mg/dL 0.3     Alkaline Phos 38 - 126 U/L 71     AST 15 - 41 U/L 22     ALT 0 - 44 U/L 18       Lipid Panel  Lab Results  Component Value Date   CHOL 146 04/02/2021   HDL 35 (L) 04/02/2021   LDLCALC 86 04/02/2021   LDLDIRECT 85 07/22/2012   TRIG 140 04/02/2021   CHOLHDL 4.2 04/02/2021   BMP Recent Labs    09/18/22 0520 09/18/22 0628  NA 131* 134*  K 5.2* 4.9  CL  --  102  CO2  --  25  GLUCOSE  --  158*  BUN  --  19  CREATININE  --  1.53*  CALCIUM  --  8.5*  GFRNONAA  --  50*     HEMOGLOBIN A1C Lab Results  Component Value Date   HGBA1C 6.1 08/04/2017    IMPRESSION:    ICD-10-CM   1. Pacemaker Biotronik [place] dual chamber pacemaker 07/04/2021  Z95.0     2. Chronic heart failure with preserved ejection fraction (HCC)  I50.32     3. Benign hypertension  I10     4. Complete heart block (HCC)  I44.2     5. Claudication in peripheral vascular disease ([place])  I73.9       RECOMMENDATIONS: SANDOR ARBOLEDA is a 67 y.o. male whose past medical history and cardiac risk factors include: Status post dual-chamber pacemaker for symptomatic bradycardia and complete heart block, HFpEF, hypertension, hyperlipidemia, chronic stage IIIa chronic kidney disease,  obesity due to excess calories, tobacco use and now smoking about 2-3 cigarettes a day or less, COPD.  1. Chronic heart failure with preserved ejection fraction ([place]) Patient has not had any recent acute exacerbation of decompensated heart failure.  He is trying his best to take  care of himself and eat better.  No changes in the medications were done today.  He is tolerating Ferne Coe and since then has not had any acute decompensated heart failure.  No changes in the medications were done today.  2. Benign hypertension Blood pressure under excellent control.  3. Complete heart block ([place]) He has complete heart block, he is pacemaker dependent and paces ventricularly 100% of the time.  4. Pacemaker Biotronik [place] dual chamber transmission 07/04/2021 Pacemaker was recently interrogated about 2 months ago, normal function.  No changes were done today, I will see him back in 6 months or sooner if problems.  5.  Claudication and peripheral arterial disease This abnormal physical exam and also abnormal Dopplers, he has left leg calf cramping at night worse in the right but both do cramp at times.  Symptoms all at this point not lifestyle limiting.  He will contact us if they become more lifestyle-limiting  with regard to symptoms of claudication.   FINAL MEDICATION LIST END OF ENCOUNTER: No orders of the defined types were placed in this encounter.    There are no discontinued medications.   Current Outpatient Medications:    acetaminophen (TYLENOL) 325 MG tablet, Take 2 tablets (650 mg total) by mouth every 4 (four) hours as needed for headache or mild pain., Disp: , Rfl:    albuterol (VENTOLIN HFA) 108 (90 Base) MCG/ACT inhaler, USE 1 TO 2 INHALATIONS EVERY 4 HOURS AS NEEDED FOR SHORTNESS OF BREATH/WHEEZE/COUGH., Disp: 17 g, Rfl: 4   amLODipine (NORVASC) 5 MG tablet, Take 1 tablet (5 mg total) by mouth daily., Disp: 90 tablet, Rfl: 3   aspirin EC 81 MG tablet, Take 1 tablet (81 mg total) by mouth daily. Swallow whole., Disp: 30 tablet, Rfl: 12   atorvastatin (LIPITOR) 80 MG tablet, Take 1 tablet (80 mg total) by mouth daily., Disp: 90 tablet, Rfl: 3   diclofenac Sodium (VOLTAREN) 1 % GEL, Apply 2 g topically 4 (four) times daily., Disp: 2 g, Rfl: 11   DULoxetine (CYMBALTA) 60 MG capsule, Take 1 capsule (60 mg total) by mouth daily., Disp: 90 capsule, Rfl: 3   Elastic Bandages & Supports ([place]) MISC, 1 each by Does not apply route once., Disp: 2 each, Rfl: 0   empagliflozin (JARDIANCE) 10 MG TABS tablet, Take 1 tablet (10 mg total) by mouth daily before breakfast., Disp: 90 tablet, Rfl: 3   esomeprazole (NEXIUM) 40 MG capsule, TAKE 1 CAPSULE DAILY AT 12 NOON, Disp: 90 capsule, Rfl: 3   ezetimibe (ZETIA) 10 MG tablet, Take 1 tablet (10 mg total) by mouth daily., Disp: 90 tablet, Rfl: 3   HYDROcodone-acetaminophen (NORCO) 7.5-325 MG tablet, Take 1 tablet by mouth every 6 (six) hours as needed for moderate pain., Disp: 120 tablet, Rfl: 0   INCRUSE ELLIPTA 62.5 MCG/INH AEPB, USE 1 INHALATION DAILY, Disp: 90 each, Rfl: 3   metoprolol succinate (TOPROL-XL) 100 MG 24 hr tablet, TAKE 1 TABLET (100 MG TOTAL) BY MOUTH EVERY MORNING. TAKE WITH OR IMMEDIATELY FOLLOWING A MEAL.,  Disp: 90 tablet, Rfl: 0   Multiple Vitamins-Minerals (OCUVITE ADULT FORMULA) CAPS, Take 1 capsule by mouth daily., Disp: 90 capsule, Rfl: 3   naloxone (NARCAN) nasal spray 4 mg/0.1 mL, Please administered to nostrils for decreased responsiveness or decreased breathing effort, Disp: 1 each, Rfl: 3   nicotine ([employer] NICOTINE) 21 mg/24hr patch, PLACE 1 PATCH ONTO SKIN DAILY, Disp: 42 patch, Rfl: 2   nystatin cream (MYCOSTATIN), APPLY TOPICALLY  TWICE A DAY, Disp: 30 g, Rfl: 23   sacubitril-valsartan (ENTRESTO) 49-51 MG, Take 1 tablet by mouth 2 (two) times daily., Disp: 180 tablet, Rfl: 3   selenium sulfide (SELSUN) 2.5 % shampoo, APPLY 1 APPLICATION TOPICALLY DAILY AS NEEDED FOR IRRITATION, Disp: 120 mL, Rfl: 3   sodium chloride (OCEAN) 0.65 % SOLN nasal spray, Place 1 spray into both nostrils as needed for congestion., Disp: , Rfl:   Current Facility-Administered Medications:    diclofenac Sodium (VOLTAREN) 1 % topical gel 2 g, 2 g, Topical, PRN, Alen Bleacher, MD  No orders of the defined types were placed in this encounter.   There are no Patient Instructions on file for this visit.   [name], MD, [facility] 12/10/2022, 3:19 PM Office: [phone] Fax: [phone] Pager: [phone]

## 2022-12-11 ENCOUNTER — Other Ambulatory Visit: Payer: Self-pay | Admitting: Student

## 2022-12-11 DIAGNOSIS — G894 Chronic pain syndrome: Secondary | ICD-10-CM

## 2022-12-11 DIAGNOSIS — G8929 Other chronic pain: Secondary | ICD-10-CM

## 2022-12-11 MED ORDER — HYDROCODONE-ACETAMINOPHEN 7.5-325 MG PO TABS: 1.0000 | ORAL_TABLET | Freq: Four times a day (QID) | ORAL | 0 refills | Status: DC | PRN

## 2022-12-31 DIAGNOSIS — I442 Atrioventricular block, complete: Secondary | ICD-10-CM | POA: Diagnosis not present

## 2022-12-31 DIAGNOSIS — Z950 Presence of cardiac pacemaker: Secondary | ICD-10-CM | POA: Diagnosis not present

## 2023-01-08 ENCOUNTER — Other Ambulatory Visit: Payer: Self-pay | Admitting: Student

## 2023-01-08 DIAGNOSIS — G8929 Other chronic pain: Secondary | ICD-10-CM

## 2023-01-08 DIAGNOSIS — G894 Chronic pain syndrome: Secondary | ICD-10-CM

## 2023-01-08 MED ORDER — HYDROCODONE-ACETAMINOPHEN 7.5-325 MG PO TABS: 1.0000 | ORAL_TABLET | Freq: Four times a day (QID) | ORAL | 0 refills | Status: DC | PRN

## 2023-02-05 ENCOUNTER — Other Ambulatory Visit: Payer: Self-pay | Admitting: Student

## 2023-02-05 DIAGNOSIS — G894 Chronic pain syndrome: Secondary | ICD-10-CM

## 2023-02-05 DIAGNOSIS — G8929 Other chronic pain: Secondary | ICD-10-CM

## 2023-02-06 ENCOUNTER — Encounter: Payer: Self-pay | Admitting: Student

## 2023-02-06 ENCOUNTER — Other Ambulatory Visit: Payer: Self-pay | Admitting: Student

## 2023-02-06 DIAGNOSIS — G894 Chronic pain syndrome: Secondary | ICD-10-CM

## 2023-02-06 DIAGNOSIS — G8929 Other chronic pain: Secondary | ICD-10-CM

## 2023-02-06 MED ORDER — HYDROCODONE-ACETAMINOPHEN 7.5-325 MG PO TABS: 1.0000 | ORAL_TABLET | Freq: Four times a day (QID) | ORAL | 0 refills | Status: DC | PRN

## 2023-02-07 ENCOUNTER — Encounter: Payer: Self-pay | Admitting: Student

## 2023-02-07 MED ORDER — HYDROCODONE-ACETAMINOPHEN 7.5-325 MG PO TABS: 1.0000 | ORAL_TABLET | Freq: Four times a day (QID) | ORAL | 0 refills | Status: DC | PRN

## 2023-02-10 ENCOUNTER — Encounter: Payer: Self-pay | Admitting: Student

## 2023-02-12 ENCOUNTER — Other Ambulatory Visit (HOSPITAL_COMMUNITY): Payer: Self-pay

## 2023-02-17 ENCOUNTER — Other Ambulatory Visit: Payer: Self-pay

## 2023-02-17 ENCOUNTER — Encounter: Payer: Self-pay | Admitting: Cardiology

## 2023-02-17 DIAGNOSIS — I5032 Chronic diastolic (congestive) heart failure: Secondary | ICD-10-CM

## 2023-02-17 MED ORDER — ENTRESTO 49-51 MG PO TABS: 1.0000 | ORAL_TABLET | Freq: Two times a day (BID) | ORAL | 3 refills | Status: DC

## 2023-02-17 MED ORDER — EMPAGLIFLOZIN 10 MG PO TABS: 10.0000 mg | ORAL_TABLET | Freq: Every day | ORAL | 3 refills | Status: DC

## 2023-03-03 ENCOUNTER — Encounter: Payer: Self-pay | Admitting: *Deleted

## 2023-03-05 ENCOUNTER — Other Ambulatory Visit: Payer: Self-pay | Admitting: Student

## 2023-03-05 DIAGNOSIS — G8929 Other chronic pain: Secondary | ICD-10-CM

## 2023-03-05 DIAGNOSIS — G894 Chronic pain syndrome: Secondary | ICD-10-CM

## 2023-03-06 MED ORDER — HYDROCODONE-ACETAMINOPHEN 7.5-325 MG PO TABS: 1.0000 | ORAL_TABLET | Freq: Four times a day (QID) | ORAL | 0 refills | Status: DC | PRN

## 2023-03-18 ENCOUNTER — Encounter: Payer: Self-pay | Admitting: Gastroenterology

## 2023-04-01 DIAGNOSIS — Z45018 Encounter for adjustment and management of other part of cardiac pacemaker: Secondary | ICD-10-CM | POA: Diagnosis not present

## 2023-04-01 DIAGNOSIS — I442 Atrioventricular block, complete: Secondary | ICD-10-CM | POA: Diagnosis not present

## 2023-04-03 ENCOUNTER — Other Ambulatory Visit: Payer: Self-pay | Admitting: Student

## 2023-04-03 DIAGNOSIS — G8929 Other chronic pain: Secondary | ICD-10-CM

## 2023-04-03 DIAGNOSIS — E785 Hyperlipidemia, unspecified: Secondary | ICD-10-CM

## 2023-04-03 DIAGNOSIS — G894 Chronic pain syndrome: Secondary | ICD-10-CM

## 2023-04-03 DIAGNOSIS — I5032 Chronic diastolic (congestive) heart failure: Secondary | ICD-10-CM

## 2023-04-03 MED ORDER — HYDROCODONE-ACETAMINOPHEN 7.5-325 MG PO TABS: 1.0000 | ORAL_TABLET | Freq: Four times a day (QID) | ORAL | 0 refills | Status: DC | PRN

## 2023-05-01 ENCOUNTER — Other Ambulatory Visit: Payer: Self-pay | Admitting: Student

## 2023-05-01 DIAGNOSIS — G8929 Other chronic pain: Secondary | ICD-10-CM

## 2023-05-01 DIAGNOSIS — G894 Chronic pain syndrome: Secondary | ICD-10-CM

## 2023-05-01 MED ORDER — HYDROCODONE-ACETAMINOPHEN 7.5-325 MG PO TABS: 1.0000 | ORAL_TABLET | Freq: Four times a day (QID) | ORAL | 0 refills | Status: DC | PRN

## 2023-05-28 ENCOUNTER — Other Ambulatory Visit: Payer: Self-pay | Admitting: Student

## 2023-05-28 DIAGNOSIS — G894 Chronic pain syndrome: Secondary | ICD-10-CM

## 2023-05-28 DIAGNOSIS — G8929 Other chronic pain: Secondary | ICD-10-CM

## 2023-05-29 MED ORDER — HYDROCODONE-ACETAMINOPHEN 7.5-325 MG PO TABS: 1.0000 | ORAL_TABLET | Freq: Four times a day (QID) | ORAL | 0 refills | Status: DC | PRN

## 2023-06-17 ENCOUNTER — Ambulatory Visit: Payer: Medicare Other | Admitting: Cardiology

## 2023-06-26 ENCOUNTER — Other Ambulatory Visit: Payer: Self-pay | Admitting: Student

## 2023-06-26 DIAGNOSIS — G8929 Other chronic pain: Secondary | ICD-10-CM

## 2023-06-26 DIAGNOSIS — G894 Chronic pain syndrome: Secondary | ICD-10-CM

## 2023-06-27 ENCOUNTER — Ambulatory Visit: Payer: Medicare Other | Admitting: Cardiology

## 2023-06-27 ENCOUNTER — Encounter: Payer: Self-pay | Admitting: Cardiology

## 2023-06-27 VITALS — BP 120/71 | HR 83 | Resp 16 | Ht 69.0 in | Wt 249.0 lb

## 2023-06-27 DIAGNOSIS — Z87891 Personal history of nicotine dependence: Secondary | ICD-10-CM

## 2023-06-27 DIAGNOSIS — I739 Peripheral vascular disease, unspecified: Secondary | ICD-10-CM | POA: Diagnosis not present

## 2023-06-27 DIAGNOSIS — I441 Atrioventricular block, second degree: Secondary | ICD-10-CM | POA: Diagnosis not present

## 2023-06-27 DIAGNOSIS — I5032 Chronic diastolic (congestive) heart failure: Secondary | ICD-10-CM

## 2023-06-27 DIAGNOSIS — I10 Essential (primary) hypertension: Secondary | ICD-10-CM

## 2023-06-27 DIAGNOSIS — Z950 Presence of cardiac pacemaker: Secondary | ICD-10-CM

## 2023-06-27 DIAGNOSIS — I442 Atrioventricular block, complete: Secondary | ICD-10-CM | POA: Diagnosis not present

## 2023-06-27 MED ORDER — ENTRESTO 49-51 MG PO TABS: 1.0000 | ORAL_TABLET | Freq: Two times a day (BID) | ORAL | 3 refills | Status: DC

## 2023-06-27 MED ORDER — HYDROCODONE-ACETAMINOPHEN 7.5-325 MG PO TABS: 1.0000 | ORAL_TABLET | Freq: Four times a day (QID) | ORAL | 0 refills | Status: DC | PRN

## 2023-06-27 MED ORDER — EMPAGLIFLOZIN 10 MG PO TABS: 10.0000 mg | ORAL_TABLET | Freq: Every day | ORAL | 3 refills | Status: DC

## 2023-06-27 NOTE — Progress Notes (Signed)
Date:  06/27/2023   ID:  Theophilus Kinds, DOB 1956/04/11, MRN [id number]  PCP:  Jerre Simon, MD  Cardiologist:  Tessa Lerner, DO, [facility] (established care 07/02/2021)  Date: 06/27/23 Last Office Visit: 12/10/2022  Chief Complaint  Patient presents with   Chronic heart failure with preserved ejection fraction    PAD   Follow-up    1 year    HPI  George Leon is a 67 y.o. male whose past medical history and cardiac risk factors include: Status post dual-chamber pacemaker for symptomatic bradycardia and complete heart block, HFpEF, hypertension, hyperlipidemia, chronic stage IIIa chronic kidney disease,  obesity due to excess calories, prior tobacco use, COPD.  Patient is being followed by the practice for chronic HFpEF as well as pacemaker implantation secondary to symptomatic second-degree type II AV block.  During initial office visit in July 2022 he was noted to be in second-degree type II AV block /as well as complete heart block and was quite symptomatic.  He underwent pacemaker implantation with Dr. Ladona Ridgel.  Subsequently had a left heart catheterization which noted no significant epicardial coronary disease.  And has medications have been uptitrated given his underlying HFpEF.  He presents today for 25-month follow-up visit.  Since last office visit he denies anginal chest pain or heart failure symptoms.  In the past he had an abnormal lower extremity arterial duplex concerning for PAD but he denies any claudication at the current workload.  He also denies any poor wound healing or injuries to lower extremities.    ALLERGIES: Allergies  Allergen Reactions   Adhesive [Tape] Rash    Paper tape is what bothers him Pulled skin off also   Hydrochlorothiazide Other (See Comments)    Back Pain & gives him kidney stones   Sulfamethoxazole Swelling and Other (See Comments)    Tongue Swelling. Pt states he has had a sulfa drug since and has tolerated them.    MEDICATION LIST  PRIOR TO VISIT: Current Meds  Medication Sig   acetaminophen (TYLENOL) 325 MG tablet Take 2 tablets (650 mg total) by mouth every 4 (four) hours as needed for headache or mild pain.   albuterol (VENTOLIN HFA) 108 (90 Base) MCG/ACT inhaler USE 1 TO 2 INHALATIONS EVERY 4 HOURS AS NEEDED FOR SHORTNESS OF BREATH/WHEEZE/COUGH.   amLODipine (NORVASC) 5 MG tablet Take 1 tablet (5 mg total) by mouth daily.   aspirin EC 81 MG tablet Take 1 tablet (81 mg total) by mouth daily. Swallow whole.   atorvastatin (LIPITOR) 80 MG tablet Take 1 tablet (80 mg total) by mouth daily.   diclofenac Sodium (VOLTAREN) 1 % GEL Apply 2 g topically 4 (four) times daily.   DULoxetine (CYMBALTA) 60 MG capsule Take 1 capsule (60 mg total) by mouth daily.   Elastic Bandages & Supports (MEDICAL COMPRESSION STOCKINGS) MISC 1 each by Does not apply route once.   esomeprazole (NEXIUM) 40 MG capsule TAKE 1 CAPSULE DAILY AT 12 NOON   ezetimibe (ZETIA) 10 MG tablet TAKE 1 TABLET BY MOUTH EVERY DAY   HYDROcodone-acetaminophen (NORCO) 7.5-325 MG tablet Take 1 tablet by mouth every 6 (six) hours as needed for moderate pain.   metoprolol succinate (TOPROL-XL) 100 MG 24 hr tablet TAKE 1 TABLET (100 MG TOTAL) BY MOUTH EVERY MORNING. TAKE WITH OR IMMEDIATELY FOLLOWING A MEAL.   Multiple Vitamins-Minerals (OCUVITE ADULT FORMULA) CAPS Take 1 capsule by mouth daily.   naloxone (NARCAN) nasal spray 4 mg/0.1 mL Please administered to nostrils for decreased  responsiveness or decreased breathing effort   nicotine ([employer] NICOTINE) 21 mg/24hr patch PLACE 1 PATCH ONTO SKIN DAILY   nystatin cream (MYCOSTATIN) APPLY TOPICALLY TWICE A DAY   selenium sulfide (SELSUN) 2.5 % shampoo APPLY 1 APPLICATION TOPICALLY DAILY AS NEEDED FOR IRRITATION   sodium chloride (OCEAN) 0.65 % SOLN nasal spray Place 1 spray into both nostrils as needed for congestion.   [DISCONTINUED] empagliflozin (JARDIANCE) 10 MG TABS tablet Take 1 tablet (10 mg total) by mouth daily before  breakfast.   [DISCONTINUED] sacubitril-valsartan (ENTRESTO) 49-51 MG Take 1 tablet by mouth 2 (two) times daily.   Current Facility-Administered Medications for the 06/27/23 encounter (Office Visit) with Tessa Lerner, DO  Medication   diclofenac Sodium (VOLTAREN) 1 % topical gel 2 g     PAST MEDICAL HISTORY: Past Medical History:  Diagnosis Date   Acute MI (HCC)    Acute on chronic diastolic heart failure (HCC) 12/01/2015   Anxiety    Arthritis    "qwhere" (08/06/2017)   CHF (congestive heart failure) (HCC) dx'd 2016   Chronic back pain    "all my back" (08/06/2017)   Chronic bronchitis (HCC)    Chronic pain    Chronic pain syndrome 12/01/2015   Complete heart block (HCC)    COPD (chronic obstructive pulmonary disease) (HCC)    Dyspnea    w/ exertion    Dyspnea on exertion    Emphysema of lung (HCC)    Encounter for care of pacemaker 07/04/2021   High cholesterol    High degree atrioventricular block    History of hiatal hernia    possible     Hypertension    Liver fatty degeneration    Macular degeneration, right eye    Pacemaker Biotronik Edora dual chamber transmission  07/04/2021   Pacemaker Dual chamber Biotronik Biggs 8dr-T Mri - [id number] pacemaker 07/04/2021 07/04/2021   Pneumonia  1990s X 1; 2016    PAST SURGICAL HISTORY: Past Surgical History:  Procedure Laterality Date   ANTERIOR CERVICAL DECOMP/DISCECTOMY FUSION  1988   "plate; cadavar bone; couple bolts"   BACK SURGERY     CARDIAC CATHETERIZATION     JOINT REPLACEMENT     KNEE ARTHROSCOPY Left 05/27/2017   Procedure: LEFT KNEE ARTHROSCOPY WITH PARTIAL MEDIAL MENISCECTOMY;  Surgeon: Kathryne Hitch, MD;  Location: [facility] OR;  Service: Orthopedics;  Laterality: Left;   LEFT HEART CATH AND CORONARY ANGIOGRAPHY N/A 07/03/2021   Procedure: LEFT HEART CATH AND CORONARY ANGIOGRAPHY;  Surgeon: Yates Decamp, MD;  Location: [facility] INVASIVE CV LAB;  Service: Cardiovascular;  Laterality: N/A;   PACEMAKER IMPLANT N/A  07/04/2021   Procedure: PACEMAKER IMPLANT;  Surgeon: Marinus Maw, MD;  Location: [facility] INVASIVE CV LAB;  Service: Cardiovascular;  Laterality: N/A;   SHOULDER ARTHROSCOPY WITH ROTATOR CUFF REPAIR Right    TOTAL HIP ARTHROPLASTY Left 08/05/2017   Procedure: LEFT TOTAL HIP ARTHROPLASTY ANTERIOR APPROACH;  Surgeon: Kathryne Hitch, MD;  Location: [facility] OR;  Service: Orthopedics;  Laterality: Left;    FAMILY HISTORY: The patient family history includes AAA (abdominal aortic aneurysm) in his father; Hypertension in his father.  SOCIAL HISTORY:  The patient  reports that he has been smoking cigarettes. He has a 11.3 pack-year smoking history. He quit smokeless tobacco use about 11 years ago.  His smokeless tobacco use included chew. He reports that he does not currently use alcohol. He reports that he does not currently use drugs after having used the following drugs: Marijuana.  REVIEW  OF SYSTEMS: Review of Systems  Cardiovascular:  Negative for chest pain, claudication, dyspnea on exertion, irregular heartbeat, leg swelling, near-syncope, orthopnea, palpitations, paroxysmal nocturnal dyspnea and syncope.  Respiratory:  Negative for shortness of breath.   Hematologic/Lymphatic: Negative for bleeding problem.  Musculoskeletal:  Negative for muscle cramps and myalgias.  Neurological:  Negative for dizziness and light-headedness.    PHYSICAL EXAM:    06/27/2023    3:06 PM 12/10/2022    2:51 PM 12/06/2022    8:12 AM  Vitals with BMI  Height 5\' 9"  5\' 9"  5\' 9"   Weight 249 lbs 254 lbs 260 lbs  BMI 36.75 37.49 38.38  Systolic 120 135   Diastolic 71 75   Pulse 83 91    Physical Exam Constitutional:      Appearance: He is obese.     Comments: Walks w/ cane  Neck:     Vascular: No carotid bruit or JVD.  Cardiovascular:     Rate and Rhythm: Normal rate and regular rhythm.     Pulses:          Dorsalis pedis pulses are 1+ on the right side and 1+ on the left side.       Posterior tibial  pulses are 0 on the right side and 0 on the left side.     Heart sounds: Normal heart sounds. No murmur heard.    No gallop.     Comments: Pacemaker in situ. Pulmonary:     Effort: Pulmonary effort is normal.     Breath sounds: Normal breath sounds.  Abdominal:     General: Bowel sounds are normal.     Palpations: Abdomen is soft.  Musculoskeletal:     Right lower leg: No edema.     Left lower leg: No edema.    CARDIAC DATABASE: EKG: 09/16/2022: Sinus rhythm, atrial sensed ventricular paced rhythm 75 bpm. June 27, 2023 sinus rhythm, 92 bpm, with intermittent ventricular paced rhythm, rare PVCs.  Echocardiogram: 07/03/2021: LVEF 60-65%, no regional wall motion abnormalities, mild LVH, grade 1 diastolic impairment, normal right ventricular size and function, mild MR, right atrial pressure 15 mmHg.  Stress Testing: No results found for this or any previous visit from the past 1095 days.  Heart catheterization: Left Heart Catheterization 07/03/21: No significant CAD.  Moderately elevated EDP. LV: 125 mmHg / 12 mmHg, EDP 22 mmHg.  Aortic pressure 109/46 with a mean of 69 mmHg.  There was no pressure gradient across the aortic valve.  LVEDP moderately elevated.  LVEF 65%, no significant MR. LM: Large vessel, normal. LAD: Very large vessel, gives origin to 3 large diagonals, very mild disease is evident with mild calcification in the proximal segment. CX: Moderate sized vessel, smooth and normal. RCA: Very large vessel, PL branch is very large.  There is mild disease in the distal RCA.  Mild calcification is evident in the proximal segment. Recommendation: No reversible cause for heart block.  He will be scheduled for pacemaker implantation tomorrow.  Lower Extremity Arterial Duplex 08/10/2021:  Proximal to mid right SFA >50% stenosis with monophasic and dampened  waveform pattern. There is diffuse disease and monophasic waveform pattern in the right below knee small arteries.  No  hemodynamically significant stenosis identified in the left lower extremity. There is diffuse plaque and severely abnormal monophasic waveform  in the small vessels below knee.  This exam reveals mildly decreased perfusion of the right lower extremity, noted at the anterior tibial artery level (ABI  0.90) and  moderately decreased perfusion of the left lower extremity at the post tibial artery level (ABI 0.71).   Abdominal Aortic Duplex 08/10/2021:  The maximum aorta (sac) diameter is 2.06 cm (prox). No AAA observed. No evidence of significant  atherosclerotic plaque. Normal flow velocities noted in bilateral common iliac arteries.   Pacemaker Biotronik Edora dual chamber pacemaker implantation 07/04/2021   Scheduled  In office pacemaker check 10/16/22  Single (S)/Dual (D)/BV: D. Presenting ASVP @ 80/min. Pacemaker dependant:  Yes. Underlying Yes, VPR @ 30/min. AP 2%, VP 100%.  AMS Episodes 0.   HVR 2. Longest 6 sec, 17 beat run. Latest 09/14/22. Thoracic impedance: At baseline and does not suggest fluid overload state. Longevity 6 Years 8 months. Magnet rate: >85%. Lead measurements: Stable. Histogram: Low (L)/normal (N)/high (H)  Normal. Patient activity 5-10%/day.   Remote dual-chamber pacemaker transmission 04/01/2023: Longevity middle of service, battery life 80%.  AP 0%, VP 99%.  There were 2 NSVT episodes for 6 seconds each.  Lead impedance and thresholds are normal.  Thoracic impedance is at baseline and does not suggest volume overload state.  Observations: Normal pacemaker function. Changes: None.  LABORATORY DATA:    Latest Ref Rng & Units 09/18/2022    6:28 AM 09/18/2022    5:20 AM 07/05/2021    3:26 AM  CBC  WBC 4.0 - 10.5 K/uL 10.7   12.5   Hemoglobin 13.0 - 17.0 g/dL 78.4  69.6  29.5   Hematocrit 39.0 - 52.0 % 45.9  47.0  39.1   Platelets 150 - 400 K/uL 217   197        Latest Ref Rng & Units 09/18/2022    6:28 AM 09/18/2022    5:20 AM 09/03/2021    2:36 PM  CMP   Glucose 70 - 99 mg/dL 284   98   BUN 8 - 23 mg/dL 19   9   Creatinine 1.32 - 1.24 mg/dL 4.40   1.02   Sodium 725 - 145 mmol/L 134  131  142   Potassium 3.5 - 5.1 mmol/L 4.9  5.2  4.7   Chloride 98 - 111 mmol/L 102   103   CO2 22 - 32 mmol/L 25   21   Calcium 8.9 - 10.3 mg/dL 8.5   9.0   Total Protein 6.5 - 8.1 g/dL 5.9     Total Bilirubin 0.3 - 1.2 mg/dL 0.3     Alkaline Phos 38 - 126 U/L 71     AST 15 - 41 U/L 22     ALT 0 - 44 U/L 18       Lipid Panel  Lab Results  Component Value Date   CHOL 146 04/02/2021   HDL 35 (L) 04/02/2021   LDLCALC 86 04/02/2021   LDLDIRECT 85 07/22/2012   TRIG 140 04/02/2021   CHOLHDL 4.2 04/02/2021   BMP Recent Labs    09/18/22 0520 09/18/22 0628  NA 131* 134*  K 5.2* 4.9  CL  --  102  CO2  --  25  GLUCOSE  --  158*  BUN  --  19  CREATININE  --  1.53*  CALCIUM  --  8.5*  GFRNONAA  --  50*    HEMOGLOBIN A1C Lab Results  Component Value Date   HGBA1C 6.1 08/04/2017    IMPRESSION:    ICD-10-CM   1. Chronic heart failure with preserved ejection fraction (HCC)  I50.32 sacubitril-valsartan (ENTRESTO) 49-51 MG    empagliflozin (JARDIANCE) 10  MG TABS tablet    2. Benign hypertension  I10     3. Second degree AV block, Mobitz type II  I44.1     4. Complete heart block (HCC)  I44.2     5. Pacemaker Biotronik Edora dual chamber pacemaker 07/04/2021  Z95.0     6. PAD (peripheral artery disease) (HCC)  I73.9     7. Former smoker  Z87.891        RECOMMENDATIONS: George Leon is a 67 y.o. male whose past medical history and cardiac risk factors include: Status post dual-chamber pacemaker for symptomatic bradycardia and complete heart block, HFpEF, hypertension, hyperlipidemia, chronic stage IIIa chronic kidney disease,  obesity due to excess calories, tobacco use and now smoking about 2-3 cigarettes a day or less, COPD.  Chronic heart failure with preserved ejection fraction (HCC) Stage B, NYHA class II. Has done well with  uptitration of medical therapy. Clinically appears to be euvolemic. EKG: Nonischemic. Will refill Entresto and Jardiance.  Benign hypertension Office blood pressures are well-controlled. Medications reconciled. No changes warranted at this time.  History of second degree AV block, Mobitz type II History of complete heart block Pacemaker Biotronik Edora dual chamber pacemaker 07/04/2021 Most recent pacemaker report reviewed. Will arrange for an office pacemaker interrogation in November 2024 as a 1 year follow-up  PAD (peripheral artery disease) (HCC) Prior lower extremity arterial duplex illustrates evidence of PAD. Clinically denies overt claudication. Reemphasized importance of improving his modifiable cardiovascular risk factors. He is also educated on seeking medical attention if he develops symptoms of claudication, poor wound healing, skin discoloration. At this time does not want to proceed with any additional testing despite knowing the results of the lower extremity arterial duplex. He also refuses a vascular consult for further evaluation.  FINAL MEDICATION LIST END OF ENCOUNTER: Meds ordered this encounter  Medications   sacubitril-valsartan (ENTRESTO) 49-51 MG    Sig: Take 1 tablet by mouth 2 (two) times daily.    Dispense:  180 tablet    Refill:  3   empagliflozin (JARDIANCE) 10 MG TABS tablet    Sig: Take 1 tablet (10 mg total) by mouth daily before breakfast.    Dispense:  90 tablet    Refill:  3     Medications Discontinued During This Encounter  Medication Reason   INCRUSE ELLIPTA 62.5 MCG/INH AEPB    empagliflozin (JARDIANCE) 10 MG TABS tablet Reorder   sacubitril-valsartan (ENTRESTO) 49-51 MG Reorder     Current Outpatient Medications:    acetaminophen (TYLENOL) 325 MG tablet, Take 2 tablets (650 mg total) by mouth every 4 (four) hours as needed for headache or mild pain., Disp: , Rfl:    albuterol (VENTOLIN HFA) 108 (90 Base) MCG/ACT inhaler, USE 1 TO  2 INHALATIONS EVERY 4 HOURS AS NEEDED FOR SHORTNESS OF BREATH/WHEEZE/COUGH., Disp: 17 g, Rfl: 4   amLODipine (NORVASC) 5 MG tablet, Take 1 tablet (5 mg total) by mouth daily., Disp: 90 tablet, Rfl: 3   aspirin EC 81 MG tablet, Take 1 tablet (81 mg total) by mouth daily. Swallow whole., Disp: 30 tablet, Rfl: 12   atorvastatin (LIPITOR) 80 MG tablet, Take 1 tablet (80 mg total) by mouth daily., Disp: 90 tablet, Rfl: 3   diclofenac Sodium (VOLTAREN) 1 % GEL, Apply 2 g topically 4 (four) times daily., Disp: 2 g, Rfl: 11   DULoxetine (CYMBALTA) 60 MG capsule, Take 1 capsule (60 mg total) by mouth daily., Disp: 90 capsule, Rfl: 3  Elastic Bandages & Supports (MEDICAL COMPRESSION STOCKINGS) MISC, 1 each by Does not apply route once., Disp: 2 each, Rfl: 0   esomeprazole (NEXIUM) 40 MG capsule, TAKE 1 CAPSULE DAILY AT 12 NOON, Disp: 90 capsule, Rfl: 3   ezetimibe (ZETIA) 10 MG tablet, TAKE 1 TABLET BY MOUTH EVERY DAY, Disp: 90 tablet, Rfl: 2   HYDROcodone-acetaminophen (NORCO) 7.5-325 MG tablet, Take 1 tablet by mouth every 6 (six) hours as needed for moderate pain., Disp: 120 tablet, Rfl: 0   metoprolol succinate (TOPROL-XL) 100 MG 24 hr tablet, TAKE 1 TABLET (100 MG TOTAL) BY MOUTH EVERY MORNING. TAKE WITH OR IMMEDIATELY FOLLOWING A MEAL., Disp: 90 tablet, Rfl: 0   Multiple Vitamins-Minerals (OCUVITE ADULT FORMULA) CAPS, Take 1 capsule by mouth daily., Disp: 90 capsule, Rfl: 3   naloxone (NARCAN) nasal spray 4 mg/0.1 mL, Please administered to nostrils for decreased responsiveness or decreased breathing effort, Disp: 1 each, Rfl: 3   nicotine ([employer] NICOTINE) 21 mg/24hr patch, PLACE 1 PATCH ONTO SKIN DAILY, Disp: 42 patch, Rfl: 2   nystatin cream (MYCOSTATIN), APPLY TOPICALLY TWICE A DAY, Disp: 30 g, Rfl: 23   selenium sulfide (SELSUN) 2.5 % shampoo, APPLY 1 APPLICATION TOPICALLY DAILY AS NEEDED FOR IRRITATION, Disp: 120 mL, Rfl: 3   sodium chloride (OCEAN) 0.65 % SOLN nasal spray, Place 1 spray into both  nostrils as needed for congestion., Disp: , Rfl:    empagliflozin (JARDIANCE) 10 MG TABS tablet, Take 1 tablet (10 mg total) by mouth daily before breakfast., Disp: 90 tablet, Rfl: 3   sacubitril-valsartan (ENTRESTO) 49-51 MG, Take 1 tablet by mouth 2 (two) times daily., Disp: 180 tablet, Rfl: 3  Current Facility-Administered Medications:    diclofenac Sodium (VOLTAREN) 1 % topical gel 2 g, 2 g, Topical, PRN, Jerre Simon, MD  No orders of the defined types were placed in this encounter.   There are no Patient Instructions on file for this visit.   Tessa Lerner, [place], [facility]  Pager:  [phone] Office: [phone]

## 2023-06-30 ENCOUNTER — Other Ambulatory Visit: Payer: Self-pay | Admitting: Student

## 2023-06-30 DIAGNOSIS — I10 Essential (primary) hypertension: Secondary | ICD-10-CM

## 2023-07-01 ENCOUNTER — Other Ambulatory Visit: Payer: Self-pay | Admitting: Student

## 2023-07-01 DIAGNOSIS — Z45018 Encounter for adjustment and management of other part of cardiac pacemaker: Secondary | ICD-10-CM | POA: Diagnosis not present

## 2023-07-01 DIAGNOSIS — I5032 Chronic diastolic (congestive) heart failure: Secondary | ICD-10-CM

## 2023-07-01 DIAGNOSIS — I442 Atrioventricular block, complete: Secondary | ICD-10-CM | POA: Diagnosis not present

## 2023-07-23 NOTE — Telephone Encounter (Signed)
DONE

## 2023-07-23 NOTE — Telephone Encounter (Signed)
done

## 2023-07-31 ENCOUNTER — Other Ambulatory Visit: Payer: Self-pay | Admitting: Student

## 2023-07-31 DIAGNOSIS — G8929 Other chronic pain: Secondary | ICD-10-CM

## 2023-07-31 DIAGNOSIS — G894 Chronic pain syndrome: Secondary | ICD-10-CM

## 2023-07-31 MED ORDER — HYDROCODONE-ACETAMINOPHEN 7.5-325 MG PO TABS: 1.0000 | ORAL_TABLET | Freq: Four times a day (QID) | ORAL | 0 refills | Status: DC | PRN

## 2023-08-29 ENCOUNTER — Other Ambulatory Visit: Payer: Self-pay | Admitting: Student

## 2023-08-29 DIAGNOSIS — G8929 Other chronic pain: Secondary | ICD-10-CM

## 2023-08-29 DIAGNOSIS — G894 Chronic pain syndrome: Secondary | ICD-10-CM

## 2023-09-01 MED ORDER — HYDROCODONE-ACETAMINOPHEN 7.5-325 MG PO TABS: 1.0000 | ORAL_TABLET | Freq: Four times a day (QID) | ORAL | 0 refills | Status: DC | PRN

## 2023-09-08 ENCOUNTER — Other Ambulatory Visit: Payer: Self-pay | Admitting: Student

## 2023-09-08 DIAGNOSIS — E785 Hyperlipidemia, unspecified: Secondary | ICD-10-CM

## 2023-09-17 ENCOUNTER — Ambulatory Visit (INDEPENDENT_AMBULATORY_CARE_PROVIDER_SITE_OTHER): Payer: Medicare Other

## 2023-09-17 DIAGNOSIS — I442 Atrioventricular block, complete: Secondary | ICD-10-CM

## 2023-09-17 LAB — CUP PACEART REMOTE DEVICE CHECK
Date Time Interrogation Session: 20241009075226
Implantable Lead Connection Status: 753985
Implantable Lead Connection Status: 753985
Implantable Lead Implant Date: 20220727
Implantable Lead Implant Date: 20220727
Implantable Lead Location: 753859
Implantable Lead Location: 753860
Implantable Lead Model: 377171
Implantable Lead Model: 377171
Implantable Lead Serial Number: 8000456432
Implantable Lead Serial Number: 8000477614
Implantable Pulse Generator Implant Date: 20220727
Pulse Gen Model: 407145
Pulse Gen Serial Number: 70164524

## 2023-09-18 ENCOUNTER — Ambulatory Visit: Payer: Medicare Other | Admitting: Cardiology

## 2023-09-28 ENCOUNTER — Other Ambulatory Visit: Payer: Self-pay | Admitting: Student

## 2023-09-28 DIAGNOSIS — E785 Hyperlipidemia, unspecified: Secondary | ICD-10-CM

## 2023-09-29 ENCOUNTER — Other Ambulatory Visit: Payer: Self-pay | Admitting: Student

## 2023-09-29 DIAGNOSIS — G8929 Other chronic pain: Secondary | ICD-10-CM

## 2023-09-29 DIAGNOSIS — G894 Chronic pain syndrome: Secondary | ICD-10-CM

## 2023-09-29 MED ORDER — HYDROCODONE-ACETAMINOPHEN 7.5-325 MG PO TABS: 1.0000 | ORAL_TABLET | Freq: Four times a day (QID) | ORAL | 0 refills | Status: DC | PRN

## 2023-09-30 ENCOUNTER — Other Ambulatory Visit: Payer: Self-pay | Admitting: Student

## 2023-09-30 ENCOUNTER — Encounter: Payer: Self-pay | Admitting: Student

## 2023-09-30 DIAGNOSIS — G8929 Other chronic pain: Secondary | ICD-10-CM

## 2023-09-30 DIAGNOSIS — G894 Chronic pain syndrome: Secondary | ICD-10-CM

## 2023-10-07 NOTE — Progress Notes (Signed)
Remote pacemaker transmission.   

## 2023-10-15 ENCOUNTER — Ambulatory Visit: Payer: Medicare Other | Admitting: Cardiology

## 2023-10-28 ENCOUNTER — Encounter: Payer: Medicare Other | Admitting: Cardiology

## 2023-10-29 ENCOUNTER — Other Ambulatory Visit: Payer: Self-pay | Admitting: Student

## 2023-10-29 DIAGNOSIS — G894 Chronic pain syndrome: Secondary | ICD-10-CM

## 2023-10-29 DIAGNOSIS — G8929 Other chronic pain: Secondary | ICD-10-CM

## 2023-10-30 MED ORDER — HYDROCODONE-ACETAMINOPHEN 7.5-325 MG PO TABS: 1.0000 | ORAL_TABLET | Freq: Four times a day (QID) | ORAL | 0 refills | Status: DC | PRN

## 2023-10-31 ENCOUNTER — Ambulatory Visit (INDEPENDENT_AMBULATORY_CARE_PROVIDER_SITE_OTHER): Payer: Medicare Other | Admitting: Student

## 2023-10-31 ENCOUNTER — Encounter: Payer: Self-pay | Admitting: Student

## 2023-10-31 VITALS — BP 123/80 | HR 88 | Temp 98.1°F | Wt 247.6 lb

## 2023-10-31 DIAGNOSIS — Z23 Encounter for immunization: Secondary | ICD-10-CM | POA: Diagnosis not present

## 2023-10-31 DIAGNOSIS — M1712 Unilateral primary osteoarthritis, left knee: Secondary | ICD-10-CM | POA: Diagnosis not present

## 2023-10-31 DIAGNOSIS — M1711 Unilateral primary osteoarthritis, right knee: Secondary | ICD-10-CM | POA: Diagnosis present

## 2023-10-31 DIAGNOSIS — B3789 Other sites of candidiasis: Secondary | ICD-10-CM | POA: Diagnosis not present

## 2023-10-31 MED ORDER — METHYLPREDNISOLONE SODIUM SUCC 40 MG IJ SOLR
40.0000 mg | Freq: Once | INTRAMUSCULAR | Status: AC
  Administered 2023-10-31: 40 mg via INTRAMUSCULAR

## 2023-10-31 MED ORDER — NYSTATIN 100000 UNIT/GM EX POWD: 1.0000 | Freq: Three times a day (TID) | CUTANEOUS | 0 refills | Status: DC

## 2023-10-31 NOTE — Progress Notes (Signed)
    SUBJECTIVE:   CHIEF COMPLAINT / HPI:   Patient is a 67 year old male with history of arthritis of the right and left knee presenting today for for steroid injection of both knee.  He said his knees has been bothering him and has progressively gotten worse in the last few months.  Previously gotten steroid shots every 3 months and at that time provided relieve. Its been few years since his last knee injection.  PERTINENT  PMH / PSH: Reviewed  OBJECTIVE:   BP 123/80   Pulse 88   Temp 98.1 F (36.7 C)   Wt 247 lb 9.6 oz (112.3 kg)   SpO2 90%   BMI 36.56 kg/m    Physical Exam General: Alert, well appearing, NAD  Left knee: Notable deformity or effusion.  Mild tenderness over the lateral and medial aspect of the knee.  Anterior and posterior drawer sign negative.   Right knee: Notable deformity or effusion.  Mild tenderness over the lateral aspect of the knee.  Anterior and posterior drawer sign negative.   Skin: beefy erythematous rash with Satellight lesions over the pannus and left groin  ASSESSMENT/PLAN:   No problem-specific Assessment & Plan notes found for this encounter.   Bilateral osteoarthritis of the knee PROCEDURE: Bilateral KNEE INJECTION Patient was given informed consent, signed copy in the chart. Appropriate time out was taken. Area prepped in usual sterile fashion. Ethyl chloride was  used for local anesthesia. A 21 gauge 1 1/2 inch needle was used. 1 cc of methylprednisolone 40 mg/ml plus 3 cc of 1% lidocaine without epinephrine was injected into the left knee using a(n) anteromedial approach. The patient tolerated the procedure well. There were no complications. Post procedure instructions were given.  Skin Rash Consistent with intertrigo.  Patient normally uses starting cream but expressed preference for nystatin powder today. -Rx nystatin powder  Health maintenance Patient received his flu and COVID-19 vaccines today.  Risk and benefit discussed with  patient.   Jerre Simon, MD [facility]

## 2023-10-31 NOTE — Patient Instructions (Addendum)
Good to see you today.  Today we gave you steroid injection on both your left and right knee.  I have some discomfort for at least 24 hours that we will improve afterwards.  Can use Tylenol for any discomfort that you have after the injections.  I have sent in your prescription for nystatin powder for your rash in your groin.  Also today we gave you your flu shot.

## 2023-11-25 ENCOUNTER — Telehealth: Payer: Self-pay

## 2023-11-25 NOTE — Telephone Encounter (Signed)
Spoke to pt about plugging in his remote monitor. States he was changing rooms in the house but will get it plugged back in.

## 2023-11-26 NOTE — Telephone Encounter (Signed)
Pt monitor is updated on 11/25/2023

## 2023-11-27 ENCOUNTER — Other Ambulatory Visit: Payer: Self-pay | Admitting: Student

## 2023-11-27 DIAGNOSIS — G894 Chronic pain syndrome: Secondary | ICD-10-CM

## 2023-11-27 DIAGNOSIS — G8929 Other chronic pain: Secondary | ICD-10-CM

## 2023-11-27 MED ORDER — HYDROCODONE-ACETAMINOPHEN 7.5-325 MG PO TABS: 1.0000 | ORAL_TABLET | Freq: Four times a day (QID) | ORAL | 0 refills | Status: DC | PRN

## 2023-12-08 ENCOUNTER — Encounter: Payer: Self-pay | Admitting: Student

## 2023-12-08 DIAGNOSIS — I5032 Chronic diastolic (congestive) heart failure: Secondary | ICD-10-CM

## 2023-12-08 NOTE — Telephone Encounter (Signed)
Medication pended to this encounter.   Talbot Grumbling, RN

## 2023-12-09 MED ORDER — METOPROLOL SUCCINATE ER 100 MG PO TB24: 100.0000 mg | ORAL_TABLET | Freq: Every morning | ORAL | 0 refills | Status: DC

## 2023-12-11 ENCOUNTER — Ambulatory Visit: Payer: Medicare Other

## 2023-12-11 VITALS — Ht 69.0 in | Wt 246.0 lb

## 2023-12-11 DIAGNOSIS — Z0000 Encounter for general adult medical examination without abnormal findings: Secondary | ICD-10-CM | POA: Diagnosis not present

## 2023-12-11 NOTE — Progress Notes (Signed)
 Subjective:   George Leon is a 68 y.o. male who presents for Medicare Annual/Subsequent preventive examination.  Visit Complete: Virtual I connected with  George Leon on 12/11/23 by a audio enabled telemedicine application and verified that I am speaking with the correct person using two identifiers.  Patient Location: Home  Provider Location: Home Office  This patient declined Interactive audio and video telecommunications. Therefore the visit was completed with audio only.  I discussed the limitations of evaluation and management by telemedicine. The patient expressed understanding and agreed to proceed.  Vital Signs: Because this visit was a virtual/telehealth visit, some criteria may be missing or patient reported. Any vitals not documented were not able to be obtained and vitals that have been documented are patient reported.  Cardiac Risk Factors include: advanced age (>57men, >79 women);dyslipidemia;family history of premature cardiovascular disease;hypertension;male gender;obesity (BMI >30kg/m2);sedentary lifestyle     Objective:    Today's Vitals   12/11/23 1003  Weight: 246 lb (111.6 kg)  PainSc: 5   PainLoc: Generalized   Body mass index is 36.33 kg/m.     12/11/2023   10:06 AM 12/06/2022   10:40 AM 09/27/2022    9:28 AM 09/24/2022   10:45 AM 05/29/2022    2:17 PM 01/31/2022    1:45 PM 10/01/2021    2:00 PM  Advanced Directives  Does Patient Have a Medical Advance Directive? Yes No No No No No No  Type of Estate Agent of [place];Living will        Copy of Healthcare Power of Attorney in Chart? No - copy requested        Would patient like information on creating a medical advance directive?  No - Patient declined  No - Patient declined No - Patient declined No - Patient declined No - Patient declined    Current Medications (verified) Outpatient Encounter Medications as of 12/11/2023  Medication Sig   acetaminophen  (TYLENOL ) 325  MG tablet Take 2 tablets (650 mg total) by mouth every 4 (four) hours as needed for headache or mild pain.   albuterol  (VENTOLIN  HFA) 108 (90 Base) MCG/ACT inhaler USE 1 TO 2 INHALATIONS EVERY 4 HOURS AS NEEDED FOR SHORTNESS OF BREATH/WHEEZE/COUGH.   amLODipine  (NORVASC ) 5 MG tablet TAKE 1 TABLET (5 MG TOTAL) BY MOUTH DAILY.   aspirin  EC 81 MG tablet Take 1 tablet (81 mg total) by mouth daily. Swallow whole.   atorvastatin  (LIPITOR ) 80 MG tablet TAKE 1 TABLET BY MOUTH DAILY   diclofenac  Sodium (VOLTAREN ) 1 % GEL Apply 2 g topically 4 (four) times daily.   DULoxetine  (CYMBALTA ) 60 MG capsule Take 1 capsule (60 mg total) by mouth daily.   Elastic Bandages & Supports (MEDICAL COMPRESSION STOCKINGS) MISC 1 each by Does not apply route once.   empagliflozin  (JARDIANCE ) 10 MG TABS tablet Take 1 tablet (10 mg total) by mouth daily before breakfast.   esomeprazole  (NEXIUM ) 40 MG capsule TAKE 1 CAPSULE DAILY AT 12 NOON   ezetimibe  (ZETIA ) 10 MG tablet TAKE 1 TABLET BY MOUTH EVERY DAY   HYDROcodone -acetaminophen  (NORCO) 7.5-325 MG tablet Take 1 tablet by mouth every 6 (six) hours as needed for moderate pain (pain score 4-6).   metoprolol  succinate (TOPROL -XL) 100 MG 24 hr tablet Take 1 tablet (100 mg total) by mouth every morning. Take with or immediately following a meal.   Multiple Vitamins-Minerals (OCUVITE ADULT FORMULA) CAPS Take 1 capsule by mouth daily.   naloxone  (NARCAN ) nasal spray 4 mg/0.1 mL  Please administered to nostrils for decreased responsiveness or decreased breathing effort   nicotine  ([employer] NICOTINE ) 21 mg/24hr patch PLACE 1 PATCH ONTO SKIN DAILY   nystatin  (MYCOSTATIN /NYSTOP ) powder Apply 1 Application topically 3 (three) times daily.   nystatin  cream (MYCOSTATIN ) APPLY TOPICALLY TWICE A DAY   sacubitril -valsartan  (ENTRESTO ) 49-51 MG Take 1 tablet by mouth 2 (two) times daily.   selenium  sulfide (SELSUN ) 2.5 % shampoo APPLY 1 APPLICATION TOPICALLY DAILY AS NEEDED FOR IRRITATION   sodium  chloride (OCEAN) 0.65 % SOLN nasal spray Place 1 spray into both nostrils as needed for congestion.   Facility-Administered Encounter Medications as of 12/11/2023  Medication   diclofenac  Sodium (VOLTAREN ) 1 % topical gel 2 g    Allergies (verified) Adhesive [tape], Hydrochlorothiazide, and Sulfamethoxazole   History: Past Medical History:  Diagnosis Date   Acute MI (HCC)    Acute on chronic diastolic heart failure (HCC) 12/01/2015   Anxiety    Arthritis    qwhere (08/06/2017)   CHF (congestive heart failure) (HCC) dx'd 2016   Chronic back pain    all my back (08/06/2017)   Chronic bronchitis (HCC)    Chronic pain    Chronic pain syndrome 12/01/2015   Complete heart block (HCC)    COPD (chronic obstructive pulmonary disease) (HCC)    Dyspnea    w/ exertion    Dyspnea on exertion    Emphysema of lung (HCC)    Encounter for care of pacemaker 07/04/2021   High cholesterol    High degree atrioventricular block    History of hiatal hernia    possible     Hypertension    Liver fatty degeneration    Macular degeneration, right eye    Pacemaker Biotronik [place] dual chamber transmission  07/04/2021   Pacemaker Dual chamber Biotronik [place] 8dr-T Mri - [id number] pacemaker 07/04/2021 07/04/2021   Pneumonia  1990s X 1; 2016   Past Surgical History:  Procedure Laterality Date   ANTERIOR CERVICAL DECOMP/DISCECTOMY FUSION  1988   plate; cadavar bone; couple bolts   BACK SURGERY     CARDIAC CATHETERIZATION     JOINT REPLACEMENT     KNEE ARTHROSCOPY Left 05/27/2017   Procedure: LEFT KNEE ARTHROSCOPY WITH PARTIAL MEDIAL MENISCECTOMY;  Surgeon: Vernetta Lonni GRADE, MD;  Location: [facility] OR;  Service: Orthopedics;  Laterality: Left;   LEFT HEART CATH AND CORONARY ANGIOGRAPHY N/A 07/03/2021   Procedure: LEFT HEART CATH AND CORONARY ANGIOGRAPHY;  Surgeon: Ladona Heinz, MD;  Location: [facility] INVAS[facility];  Service: Cardiovascular;  Laterality: N/A;   PACEMAKER IMPLANT N/A 07/04/2021    Procedure: PACEMAKER IMPLANT;  Surgeon: Waddell Danelle ORN, MD;  Location: [facility];  Service: Cardiovascular;  Laterality: N/A;   SHOULDER ARTHROSCOPY WITH ROTATOR CUFF REPAIR Right    TOTAL HIP ARTHROPLASTY Left 08/05/2017   Procedure: LEFT TOTAL HIP ARTHROPLASTY ANTERIOR APPROACH;  Surgeon: Vernetta Lonni GRADE, MD;  Location: [facility] OR;  Service: Orthopedics;  Laterality: Left;   Family History  Problem Relation Age of Onset   Hypertension Father    AAA (abdominal aortic aneurysm) Father    Colon cancer Neg Hx    Esophageal cancer Neg Hx    Social History   Socioeconomic History   Marital status: Married    Spouse name: Not on file   Number of children: 1   Years of education: Not on file   Highest education level: Not on file  Occupational History   Not on file  Tobacco Use  Smoking status: Some Days    Current packs/day: 0.25    Average packs/day: 0.3 packs/day for 45.0 years (11.3 ttl pk-yrs)    Types: Cigarettes   Smokeless tobacco: Former    Types: Chew    Quit date: 2013  Vaping Use   Vaping status: Never Used  Substance and Sexual Activity   Alcohol use: Not Currently    Comment: 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   Drug use: Not Currently    Types: Marijuana    Comment: 08/06/2017 last time was 07/2017; none in years before that   Sexual activity: Never  Other Topics Concern   Not on file  Social History Narrative   Not on file   Social Drivers of Health   Financial Resource Strain: Low Risk  (12/11/2023)   Overall Financial Resource Strain (CARDIA)    Difficulty of Paying Living Expenses: Not very hard  Food Insecurity: No Food Insecurity (12/11/2023)   Hunger Vital Sign    Worried About Running Out of Food in the Last Year: Never true    Ran Out of Food in the Last Year: Never true  Transportation Needs: No Transportation Needs (12/11/2023)   PRAPARE - Administrator, Civil Service (Medical): No    Lack of Transportation  (Non-Medical): No  Physical Activity: Inactive (12/11/2023)   Exercise Vital Sign    Days of Exercise per Week: 0 days    Minutes of Exercise per Session: 0 min  Stress: No Stress Concern Present (12/11/2023)   [employer] of Occupational Health - Occupational Stress Questionnaire    Feeling of Stress : Not at all  Social Connections: Socially Isolated (12/11/2023)   Social Connection and Isolation Panel [NHANES]    Frequency of Communication with Friends and Family: Once a week    Frequency of Social Gatherings with Friends and Family: Never    Attends Religious Services: Never    Database Administrator or Organizations: No    Attends Engineer, Structural: Never    Marital Status: Married    Tobacco Counseling Ready to quit: Not Answered Counseling given: Not Answered   Clinical Intake:  Pre-visit preparation completed: Yes  Pain : 0-10 Pain Score: 5  Pain Type: Chronic pain Pain Location: Generalized     BMI - recorded: 36.33 Nutritional Status: BMI > 30  Obese Nutritional Risks: None Diabetes: No  How often do you need to have someone help you when you read instructions, pamphlets, or other written materials from your doctor or pharmacy?: 1 - Never What is the last grade level you completed in school?: SOME COLLEGE  Interpreter Needed?: No  Information entered by :: Maybel Dambrosio N. Coreena Rubalcava, LPN.   Activities of Daily Living    12/11/2023   10:09 AM  In your present state of health, do you have any difficulty performing the following activities:  Hearing? 1  Vision? 1  Difficulty concentrating or making decisions? 1  Walking or climbing stairs? 1  Dressing or bathing? 0  Doing errands, shopping? 0  Preparing Food and eating ? N  Using the Toilet? N  In the past six months, have you accidently leaked urine? Y  Do you have problems with loss of bowel control? N  Managing your Medications? N  Managing your Finances? N  Housekeeping or managing your  Housekeeping? Y    Patient Care Team: Rosendo Rush, MD as PCP - General (Family Medicine) [facility] Ganji,  Gordy, MD as Consulting Physician (Cardiology) Michele Richardson, DO as Consulting Physician (Cardiology) Aneita Gwendlyn DASEN, MD as Consulting Physician (Gastroenterology)  Indicate any recent Medical Services you may have received from other than [facility] providers in the past year (date may be approximate).     Assessment:   This is a routine wellness examination for [place].  Hearing/Vision screen Hearing Screening - Comments:: Some hearing loss; no hearing aids. Vision Screening - Comments:: Wears rx glasses; patient has macular degeneration- up to date with routine eye exams with [facility].    Goals Addressed             This Visit's Progress    Blood Pressure < 140/90       Quit smoking / using tobacco        Depression Screen    12/11/2023   10:07 AM 12/06/2022   10:39 AM 09/27/2022    9:28 AM 05/29/2022    2:17 PM 01/31/2022    1:45 PM 10/01/2021    2:00 PM 07/23/2021   11:05 AM  PHQ 2/9 Scores  PHQ - 2 Score 0 1 1 1  0 1 0  PHQ- 9 Score 4  7 5 1 4 3     Fall Risk    12/11/2023   10:07 AM 12/06/2022   10:43 AM 07/23/2021   11:05 AM 08/07/2020    9:31 AM 06/14/2020    3:52 PM  Fall Risk   Falls in the past year? 1 1 1  0 0  Comment   near misses    Number falls in past yr: 1 1 1  0 0  Injury with Fall? 1 0 0 0   Risk for fall due to : History of fall(s);Impaired balance/gait;Orthopedic patient  Impaired balance/gait    Follow up Education provided;Falls prevention discussed;Falls evaluation completed    Falls evaluation completed    MEDICARE RISK AT HOME: Medicare Risk at Home Any stairs in or around the home?: Yes If so, are there any without handrails?: No Home free of loose throw rugs in walkways, pet beds, electrical cords, etc?: Yes Adequate lighting in your home to reduce risk of falls?: Yes Life alert?: No Use of a cane, walker  or w/c?: Yes Grab bars in the bathroom?: No Shower chair or bench in shower?: Yes Elevated toilet seat or a handicapped toilet?: No  TIMED UP AND GO:  Was the test performed?  No    Cognitive Function:    12/11/2023   10:12 AM  MMSE - Mini Mental State Exam  Not completed: Unable to complete        12/11/2023   10:10 AM  6CIT Screen  What Year? 0 points  What month? 0 points  What time? 0 points  Count back from 20 0 points  Months in reverse 0 points  Repeat phrase 0 points  Total Score 0 points    Immunizations Immunization History  Administered Date(s) Administered   Fluad Quad(high Dose 65+) 09/27/2022   Fluad Trivalent(High Dose 65+) 10/31/2023   Influenza Split 09/19/2011, 08/25/2012   Influenza,inj,Quad PF,6+ Mos 08/23/2013, 12/23/2014, 01/18/2016, 08/16/2016, 08/04/2017, 09/21/2018, 11/22/2019, 10/30/2020, 10/01/2021   PFIZER Comirnaty(Gray Top)Covid-19 Tri-Sucrose Vaccine 04/02/2021   PFIZER(Purple Top)SARS-COV-2 Vaccination 05/09/2020, 05/30/2020   PNEUMOCOCCAL CONJUGATE-20 07/23/2021   Pfizer Covid-19 Vaccine Bivalent Booster 85yrs & up 10/01/2021   Pfizer(Comirnaty)Fall Seasonal Vaccine 12 years and older 10/31/2023   Pneumococcal Polysaccharide-23 10/16/2012, 11/16/2018   Td 06/13/2005   Tdap 09/15/2012    TDAP status:  Due, Education has been provided regarding the importance of this vaccine. Advised may receive this vaccine at local pharmacy or Health Dept. Aware to provide a copy of the vaccination record if obtained from local pharmacy or Health Dept. Verbalized acceptance and understanding.  Flu Vaccine status: Up to date  Pneumococcal vaccine status: Up to date  Covid-19 vaccine status: Completed vaccines  Qualifies for Shingles Vaccine? Yes   Zostavax completed No   Shingrix Completed?: No.    Education has been provided regarding the importance of this vaccine. Patient has been advised to call insurance company to determine out of pocket  expense if they have not yet received this vaccine. Advised may also receive vaccine at local pharmacy or Health Dept. Verbalized acceptance and understanding.  Screening Tests Health Maintenance  Topic Date Due   Zoster Vaccines- Shingrix (1 of 2) Never done   DTaP/Tdap/Td (3 - Td or Tdap) 09/15/2022   Colonoscopy  04/10/2023   Medicare Annual Wellness (AWV)  12/10/2024   Pneumonia Vaccine 61+ Years old  Completed   INFLUENZA VACCINE  Completed   COVID-19 Vaccine  Completed   Hepatitis C Screening  Completed   HPV VACCINES  Aged Out   Lung Cancer Screening  Discontinued    Health Maintenance  Health Maintenance Due  Topic Date Due   Zoster Vaccines- Shingrix (1 of 2) Never done   DTaP/Tdap/Td (3 - Td or Tdap) 09/15/2022   Colonoscopy  04/10/2023    Colorectal cancer screening: Type of screening: Colonoscopy. Completed 04/09/2022. Repeat every 1 years-Patient is overdue.  Lung Cancer Screening: (Low Dose CT Chest recommended if Age 93-80 years, 20 pack-year currently smoking OR have quit w/in 15years.) does not qualify.   Lung Cancer Screening Referral: discontinued  Additional Screening:  Hepatitis C Screening: does qualify; Completed 07/14/2013  Vision Screening: Recommended annual ophthalmology exams for early detection of glaucoma and other disorders of the eye. Is the patient up to date with their annual eye exam?  Yes  Who is the provider or what is the name of the office in which the patient attends annual eye exams? [facility] If pt is not established with a provider, would they like to be referred to a provider to establish care? No .   Dental Screening: Recommended annual dental exams for proper oral hygiene.  Community Resource Referral / Chronic Care Management: CRR required this visit?  No   CCM required this visit?  No     Plan:     I have personally reviewed and noted the following in the patient's chart:   Medical and social history Use of  alcohol, tobacco or illicit drugs  Current medications and supplements including opioid prescriptions. Patient is currently taking opioid prescriptions. Information provided to patient regarding non-opioid alternatives. Patient advised to discuss non-opioid treatment plan with their provider. Functional ability and status Nutritional status Physical activity Advanced directives List of other physicians Hospitalizations, surgeries, and ER visits in previous 12 months Vitals Screenings to include cognitive, depression, and falls Referrals and appointments  In addition, I have reviewed and discussed with patient certain preventive protocols, quality metrics, and best practice recommendations. A written personalized care plan for preventive services as well as general preventive health recommendations were provided to patient.     Roz LOISE Fuller, LPN   07/15/7973   After Visit Summary: (MyChart) Due to this being a telephonic visit, the after visit summary with patients personalized plan was offered to patient via MyChart  Nurse Notes: Patient is due for TDAP, Shingrix and Colonoscopy (Dr. Aneita recently retired from [facility]).

## 2023-12-11 NOTE — Patient Instructions (Signed)
 George Leon , Thank you for taking time to come for your Medicare Wellness Visit. I appreciate your ongoing commitment to your health goals. Please review the following plan we discussed and let me know if I can assist you in the future.   Referrals/Orders/Follow-Ups/Clinician Recommendations: You are due for Shingrix Vaccine and Tdap (You can get these vaccines from your local pharmacy.  Also, you are overdue for screening colonoscopy.  Please give office a call if you need help getting this scheduled with [facility].  This is a list of the screening recommended for you and due dates:  Health Maintenance  Topic Date Due   Zoster (Shingles) Vaccine (1 of 2) Never done   DTaP/Tdap/Td vaccine (3 - Td or Tdap) 09/15/2022   Colon Cancer Screening  04/10/2023   Medicare Annual Wellness Visit  12/10/2024   Pneumonia Vaccine  Completed   Flu Shot  Completed   COVID-19 Vaccine  Completed   Hepatitis C Screening  Completed   HPV Vaccine  Aged Out   Screening for Lung Cancer  Discontinued    Advanced directives: (Copy Requested) Please bring a copy of your health care power of attorney and living will to the office to be added to your chart at your convenience.  Next Medicare Annual Wellness Visit scheduled for next year: No

## 2023-12-18 ENCOUNTER — Ambulatory Visit: Payer: Medicare Other | Admitting: Cardiology

## 2023-12-18 ENCOUNTER — Ambulatory Visit (INDEPENDENT_AMBULATORY_CARE_PROVIDER_SITE_OTHER): Payer: Medicare Other

## 2023-12-18 DIAGNOSIS — I442 Atrioventricular block, complete: Secondary | ICD-10-CM

## 2023-12-18 LAB — CUP PACEART REMOTE DEVICE CHECK
Battery Remaining Percentage: 75 %
Brady Statistic RA Percent Paced: 0 %
Brady Statistic RV Percent Paced: 98 %
Date Time Interrogation Session: 20250109074247
Implantable Lead Connection Status: 753985
Implantable Lead Connection Status: 753985
Implantable Lead Implant Date: 20220727
Implantable Lead Implant Date: 20220727
Implantable Lead Location: 753859
Implantable Lead Location: 753860
Implantable Lead Model: 377171
Implantable Lead Model: 377171
Implantable Lead Serial Number: 8000456432
Implantable Lead Serial Number: 8000477614
Implantable Pulse Generator Implant Date: 20220727
Lead Channel Impedance Value: 527 Ohm
Lead Channel Impedance Value: 683 Ohm
Lead Channel Pacing Threshold Amplitude: 0.9 V
Lead Channel Pacing Threshold Pulse Width: 0.4 ms
Lead Channel Sensing Intrinsic Amplitude: 17.3 mV
Lead Channel Sensing Intrinsic Amplitude: 4.4 mV
Lead Channel Setting Pacing Amplitude: 3 V
Lead Channel Setting Pacing Amplitude: 3.6 V
Lead Channel Setting Pacing Pulse Width: 0.4 ms
Pulse Gen Model: 407145
Pulse Gen Serial Number: 70164524

## 2023-12-24 ENCOUNTER — Other Ambulatory Visit: Payer: Self-pay | Admitting: Student

## 2023-12-24 DIAGNOSIS — G894 Chronic pain syndrome: Secondary | ICD-10-CM

## 2023-12-24 DIAGNOSIS — M545 Low back pain, unspecified: Secondary | ICD-10-CM

## 2023-12-26 ENCOUNTER — Encounter: Payer: Self-pay | Admitting: Student

## 2023-12-26 ENCOUNTER — Ambulatory Visit (INDEPENDENT_AMBULATORY_CARE_PROVIDER_SITE_OTHER): Payer: Medicare Other | Admitting: Student

## 2023-12-26 VITALS — BP 113/75 | HR 89 | Ht 69.0 in | Wt 254.0 lb

## 2023-12-26 DIAGNOSIS — Z1211 Encounter for screening for malignant neoplasm of colon: Secondary | ICD-10-CM

## 2023-12-26 DIAGNOSIS — M5090 Cervical disc disorder, unspecified, unspecified cervical region: Secondary | ICD-10-CM

## 2023-12-26 DIAGNOSIS — M5489 Other dorsalgia: Secondary | ICD-10-CM

## 2023-12-26 DIAGNOSIS — G894 Chronic pain syndrome: Secondary | ICD-10-CM

## 2023-12-26 DIAGNOSIS — G8929 Other chronic pain: Secondary | ICD-10-CM

## 2023-12-26 MED ORDER — OXYCODONE-ACETAMINOPHEN 5-325 MG PO TABS: 1.0000 | ORAL_TABLET | Freq: Four times a day (QID) | ORAL | 0 refills | Status: DC | PRN

## 2023-12-26 NOTE — Progress Notes (Unsigned)
    SUBJECTIVE:   CHIEF COMPLAINT / HPI: Chronic Opioid Use  68 year old male with history of chronic opioid use presenting today to discuss adjustment in meds. He has been on Norco 7.5mg  Q4H PRN for years to manage his chronic pain syndrome including lower back pain that has affected his mobility. He has been stable on current regime but report recently medication hasn't been as effective with pain control. Expressed desire to switch to equivalent dose of Oxycodone which his has tried in the past and had better result. Endorses good tolerance to his medications.   Colon Cancer Screening  Due for coloscopy. Last colonscopy in 2023 by Dr. Anselm Jungling found multiple benign polyps where 6 of the polyps were precancerous. Plan was for repeat colonoscopy in a Year. Per patient Dr. Anselm Jungling is now retired; patient express desire to have colonoscopy completed by [facility]    PERTINENT  PMH / PSH: Reviewed   OBJECTIVE:   BP 113/75   Pulse 89   Ht 5\' 9"  (1.753 m)   Wt 254 lb (115.2 kg)   SpO2 94%   BMI 37.51 kg/m    Physical Exam General: Alert, well appearing, NAD Cardiovascular: RRR, No Murmurs, Normal S2/S2 Respiratory: CTAB, No wheezing or Rales Abdomen: No distension or tenderness Extremities: No edema on extremities  ASSESSMENT/PLAN:   Chronic pain syndrome Patient appears to have diminished relieve on current treatment regime (Norco 7.5 mg). Will switch to equivalent dose of 5mg  Oxycodone. -PDMP Reviewed  -Discontinued Norco, Rx Oxycodone 5 mg every 6 hours as needed -Follow up in a month to pain control on new regime    Colon Cancer Screening Ambulatory GI referral placed for colonoscopy  Jerre Simon, MD [facility]

## 2023-12-26 NOTE — Patient Instructions (Addendum)
Great to see you today.  You are due for your colonoscopy and I have sent in referral to [facility] to have your colonoscopy completed.  Also per your request I have switched you from Norco to equivalent of Percocet which is a 5 mg every 6 hours as needed.  I have also provided you a letter indicating Adam to be your primary caregiver at home.

## 2023-12-27 ENCOUNTER — Encounter: Payer: Self-pay | Admitting: Student

## 2023-12-27 NOTE — Assessment & Plan Note (Signed)
Patient appears to have diminished relieve on current treatment regime (Norco 7.5 mg). Will switch to equivalent dose of 5mg  Oxycodone. -PDMP Reviewed  -Discontinued Norco, Rx Oxycodone 5 mg every 6 hours as needed -Follow up in a month to pain control on new regime

## 2024-01-21 ENCOUNTER — Telehealth: Payer: Self-pay

## 2024-01-21 NOTE — Telephone Encounter (Signed)
RN preparing patient's chart for pre-visit and colonoscopy scheduled at [facility] for Friday, 02/06/24 with Dr. Leonides Schanz. In preparing the chart, RN made not of multiple comorbidities which may warrant the need for patient to have procedure done at the hospital. RN will communicate with Cathlyn Parsons, CRNA for evaluation.

## 2024-01-22 ENCOUNTER — Ambulatory Visit (AMBULATORY_SURGERY_CENTER): Payer: Medicare Other

## 2024-01-22 VITALS — Ht 69.0 in | Wt 246.0 lb

## 2024-01-22 DIAGNOSIS — Z860100 Personal history of colon polyps, unspecified: Secondary | ICD-10-CM

## 2024-01-22 MED ORDER — SUFLAVE 178.7 G PO SOLR: 1.0000 | Freq: Once | ORAL | 0 refills | Status: AC

## 2024-01-22 NOTE — Progress Notes (Signed)
Pt's name and DOB verified at the beginning of the pre-visit wit 2 identifiers  Permission given to speak with  Pt denies any difficulty with ambulating,sitting, laying down or rolling side to side  Pt has no issues with ambulation   Pt has no issues moving head neck or swallowing  No egg or soy allergy known to patient   No issues known to pt with past sedation with any surgeries or procedures  Pt denies having issues being intubated  No FH of Malignant Hyperthermia  Pt is not on diet pills or shots  Pt is not on home 02   Pt is not on blood thinners   Pt denies issues with constipation   Pt is not on dialysis  Pt denise any abnormal heart rhythms   Pt denies any upcoming cardiac testing  Patient's chart reviewed by Cathlyn Parsons CNRA prior to pre-visit and patient appropriate for the [facility].  Pre-visit completed and red dot placed by patient's name on their procedure day (on provider's schedule).    Chart not reviewed by CRNA prior to [facility]  Visit by phone  Pt states weight is 246 lb    IInstructions reviewed. Pt given [facility], [facility] main # and MD on call # prior to instructions.  Pt states understanding of instructions. Instructed to review again prior to procedure. Pt states they will.   Informed pt that they will receive a text or  call from [facility] regarding there prep med.

## 2024-01-24 ENCOUNTER — Other Ambulatory Visit: Payer: Self-pay | Admitting: Student

## 2024-01-24 DIAGNOSIS — M5090 Cervical disc disorder, unspecified, unspecified cervical region: Secondary | ICD-10-CM

## 2024-01-24 DIAGNOSIS — G894 Chronic pain syndrome: Secondary | ICD-10-CM

## 2024-01-26 ENCOUNTER — Other Ambulatory Visit: Payer: Self-pay | Admitting: Student

## 2024-01-26 DIAGNOSIS — G894 Chronic pain syndrome: Secondary | ICD-10-CM

## 2024-01-26 DIAGNOSIS — M5090 Cervical disc disorder, unspecified, unspecified cervical region: Secondary | ICD-10-CM

## 2024-01-26 MED ORDER — OXYCODONE-ACETAMINOPHEN 5-325 MG PO TABS: 1.0000 | ORAL_TABLET | Freq: Four times a day (QID) | ORAL | 0 refills | Status: DC | PRN

## 2024-01-27 NOTE — Progress Notes (Signed)
 Remote pacemaker transmission.

## 2024-01-28 ENCOUNTER — Encounter: Payer: Self-pay | Admitting: Student

## 2024-02-05 ENCOUNTER — Encounter: Payer: Self-pay | Admitting: Internal Medicine

## 2024-02-06 ENCOUNTER — Encounter: Payer: Medicare Other | Admitting: Internal Medicine

## 2024-02-07 NOTE — Progress Notes (Deleted)
  Electrophysiology Office Note:   Date:  02/07/2024  ID:  George Leon, DOB Nov 21, 1956, MRN [id number]  Primary Cardiologist: None Primary Heart Failure: None Electrophysiologist: None *** {Click to update primary MD,subspecialty MD or APP then REFRESH:1}    History of Present Illness:   George Leon is a 68 y.o. male with h/o CHB s/p PPM, HTN, CHF, PVD, PH, tobacco abuse, COPD, GERD seen today for routine electrophysiology followup.   Since last being seen in our clinic the patient reports doing ***.  he denies chest pain, palpitations, dyspnea, PND, orthopnea, nausea, vomiting, dizziness, syncope, edema, weight gain, or early satiety.   Review of systems complete and found to be negative unless listed in HPI.   EP Information / Studies Reviewed:    EKG is ordered today. Personal review as below.      PPM Interrogation-  reviewed in detail today,  See PACEART report.  Device History: Biotronik Dual Chamber PPM implanted 07/04/2021 for CHB  Studies:  LHC 06/2021 > no significant CAD, no cause of AVB  ECHO 06/2021 > LVEF 60-65%, G1DD   Arrhythmia / AAD CHB    Risk Assessment/Calculations:   {Does this patient have ATRIAL FIBRILLATION?:[id number]} No BP recorded.  {Refresh Note OR Click here to enter BP  :1}***        Physical Exam:   VS:  There were no vitals taken for this visit.   Wt Readings from Last 3 Encounters:  01/22/24 246 lb (111.6 kg)  12/26/23 254 lb (115.2 kg)  12/11/23 246 lb (111.6 kg)     GEN: Well nourished, well developed in no acute distress NECK: No JVD; No carotid bruits CARDIAC: {EPRHYTHM:28826}, no murmurs, rubs, gallops RESPIRATORY:  Clear to auscultation without rales, wheezing or rhonchi  ABDOMEN: Soft, non-tender, non-distended EXTREMITIES:  No edema; No deformity   ASSESSMENT AND PLAN:    CHB s/p Biotronik PPM  -Normal PPM function -See Pace Art report -No changes today  HFpEF  -GDMT per primary Cardiology   Hypertension   -well controlled on current regimen ***   Disposition:   Follow up with EP APP {EPFOLLOW WU:98119}  Signed, Canary Brim, NP-C, AGACNP-BC [facility] - Electrophysiology  02/07/2024, 11:43 AM

## 2024-02-09 ENCOUNTER — Other Ambulatory Visit: Payer: Self-pay | Admitting: Cardiology

## 2024-02-09 ENCOUNTER — Ambulatory Visit: Payer: Medicare Other | Admitting: Pulmonary Disease

## 2024-02-09 DIAGNOSIS — I5032 Chronic diastolic (congestive) heart failure: Secondary | ICD-10-CM

## 2024-02-09 DIAGNOSIS — I10 Essential (primary) hypertension: Secondary | ICD-10-CM

## 2024-02-09 DIAGNOSIS — Z950 Presence of cardiac pacemaker: Secondary | ICD-10-CM

## 2024-02-09 DIAGNOSIS — I442 Atrioventricular block, complete: Secondary | ICD-10-CM

## 2024-02-23 ENCOUNTER — Other Ambulatory Visit: Payer: Self-pay | Admitting: Student

## 2024-02-23 DIAGNOSIS — G894 Chronic pain syndrome: Secondary | ICD-10-CM

## 2024-02-23 DIAGNOSIS — M5090 Cervical disc disorder, unspecified, unspecified cervical region: Secondary | ICD-10-CM

## 2024-02-23 MED ORDER — OXYCODONE-ACETAMINOPHEN 5-325 MG PO TABS: 1.0000 | ORAL_TABLET | Freq: Four times a day (QID) | ORAL | 0 refills | Status: DC | PRN

## 2024-02-29 ENCOUNTER — Encounter: Payer: Self-pay | Admitting: Certified Registered Nurse Anesthetist

## 2024-03-03 ENCOUNTER — Ambulatory Visit: Payer: Medicare Other | Admitting: Internal Medicine

## 2024-03-03 ENCOUNTER — Encounter: Payer: Self-pay | Admitting: Internal Medicine

## 2024-03-03 VITALS — BP 129/68 | HR 87 | Temp 98.1°F | Ht 69.0 in | Wt 246.0 lb

## 2024-03-03 DIAGNOSIS — J449 Chronic obstructive pulmonary disease, unspecified: Secondary | ICD-10-CM

## 2024-03-03 DIAGNOSIS — Z860100 Personal history of colon polyps, unspecified: Secondary | ICD-10-CM

## 2024-03-03 DIAGNOSIS — R0609 Other forms of dyspnea: Secondary | ICD-10-CM

## 2024-03-03 HISTORY — DX: Chronic obstructive pulmonary disease, unspecified: J44.9

## 2024-03-03 HISTORY — DX: Other forms of dyspnea: R06.09

## 2024-03-03 MED ORDER — SODIUM CHLORIDE 0.9 % IV SOLN: 500.0000 mL | INTRAVENOUS | Status: AC

## 2024-03-03 NOTE — Progress Notes (Unsigned)
 Patient was saturating 90-91% on room air. Patient states that this is his usual oxygen saturation level due to COPD. We will have to reschedule his colonoscopy to be done at the hospital for polyp surveillance. Will place a referral to pulmonology to try to optimize his COPD regimen

## 2024-03-03 NOTE — Progress Notes (Unsigned)
 Pt starting sat 89% to 91% on RA.  Pt states "he get extremely SOB with walking" and he probably could get his "doctor to order oxygen all the time."  Attempted to work with patient and give albuterol neb before case with the understanding if sat is less than 90% on RA after case, he would have to go to hospital.  He stated he would sign himself out AMA.  Case canceled and MD made aware.

## 2024-03-04 ENCOUNTER — Telehealth: Payer: Self-pay

## 2024-03-04 NOTE — Telephone Encounter (Signed)
-----   Message from Imogene Burn sent at 03/03/2024  2:22 PM EDT ----- Pod B triage, patient was saturating at 90-91% due to COPD when he showed up at the [facility]. Thus he will need to be done at the hospital per our anesthesia staff. I will add him to our hospital procedure list. Please call him to see if he wants to get on the hospital schedule for May or June. Thanks.

## 2024-03-04 NOTE — Telephone Encounter (Signed)
 Called patient to discuss rescheduling colon for June. Pt is hesitant to reschedule & would like time to consider. He would like to know if there is an alternative to the colon that is less invasive. Advised him I'd reach out to Dr. Leonides Schanz for her recommendations & will be back in touch.

## 2024-03-04 NOTE — Telephone Encounter (Signed)
 Pt made aware of MD recommendations. At this time he would prefer to hold off on rescheduling & will call back to schedule when ready. Prefers to get breathing under control first. He's been made aware that dates are limited & slots do fill up fast.

## 2024-03-09 ENCOUNTER — Encounter: Payer: Self-pay | Admitting: Student

## 2024-03-09 ENCOUNTER — Ambulatory Visit: Attending: Student | Admitting: Student

## 2024-03-09 ENCOUNTER — Encounter: Admitting: Physician Assistant

## 2024-03-09 VITALS — BP 112/74 | HR 82 | Ht 69.0 in | Wt 249.2 lb

## 2024-03-09 DIAGNOSIS — I5022 Chronic systolic (congestive) heart failure: Secondary | ICD-10-CM | POA: Insufficient documentation

## 2024-03-09 DIAGNOSIS — Z950 Presence of cardiac pacemaker: Secondary | ICD-10-CM | POA: Diagnosis present

## 2024-03-09 LAB — CUP PACEART INCLINIC DEVICE CHECK
Battery Remaining Percentage: 70 %
Brady Statistic RA Percent Paced: 0 %
Brady Statistic RV Percent Paced: 98 %
Date Time Interrogation Session: 20250401114937
Implantable Lead Connection Status: 753985
Implantable Lead Connection Status: 753985
Implantable Lead Implant Date: 20220727
Implantable Lead Implant Date: 20220727
Implantable Lead Location: 753859
Implantable Lead Location: 753860
Implantable Lead Model: 377171
Implantable Lead Model: 377171
Implantable Lead Serial Number: 8000456432
Implantable Lead Serial Number: 8000477614
Implantable Pulse Generator Implant Date: 20220727
Lead Channel Impedance Value: 546 Ohm
Lead Channel Impedance Value: 643 Ohm
Lead Channel Pacing Threshold Amplitude: 0.4 V
Lead Channel Pacing Threshold Amplitude: 0.8 V
Lead Channel Pacing Threshold Pulse Width: 0.4 ms
Lead Channel Pacing Threshold Pulse Width: 0.4 ms
Lead Channel Setting Pacing Amplitude: 3 V
Lead Channel Setting Pacing Amplitude: 3.6 V
Lead Channel Setting Pacing Pulse Width: 0.4 ms
Pulse Gen Model: 407145
Pulse Gen Serial Number: 70164524

## 2024-03-09 NOTE — Progress Notes (Signed)
  Electrophysiology Office Note:   Date:  03/09/2024  ID:  George Leon, DOB 1956-04-06, MRN [id number]  Primary Cardiologist: None Primary Heart Failure: None Electrophysiologist: Lewayne Bunting, MD      History of Present Illness:   George Leon is a 68 y.o. male with h/o CHB s/p PPM, HTN, CHF, PVD, PH, tobacco abuse, COPD, GERD seen today for routine electrophysiology followup.   Since last being seen in our clinic the patient reports doing well from a cardiac perspective. Currently,  he denies chest pain, palpitations, dyspnea, PND, orthopnea, nausea, vomiting, dizziness, syncope, edema, weight gain, or early satiety.   Review of systems complete and found to be negative unless listed in HPI.   EP Information / Studies Reviewed:    EKG is ordered today. Personal review as below.  EKG Interpretation Date/Time:  Tuesday March 09 2024 11:40:58 EDT Ventricular Rate:  82 PR Interval:  184 QRS Duration:  116 QT Interval:  366 QTC Calculation: 427 R Axis:   -65  Text Interpretation: Atrial-sensed ventricular-paced rhythm When compared with ECG of 18-Sep-2022 04:49, PREVIOUS ECG IS PRESENT Confirmed by Maxine Glenn [phone]) on 03/09/2024 11:46:13 AM   PPM Interrogation-  reviewed in detail today,  See PACEART report.  Device History: Biotronik Dual Chamber PPM implanted 07/04/2021 for CHB  Studies:  LHC 06/2021 > no significant CAD, no cause of AVB  ECHO 06/2021 > LVEF 60-65%, G1DD   Physical Exam:   VS:  BP 112/74   Pulse 82   Ht 5\' 9"  (1.753 m)   Wt 249 lb 3.2 oz (113 kg)   SpO2 91%   BMI 36.80 kg/m    Wt Readings from Last 3 Encounters:  03/09/24 249 lb 3.2 oz (113 kg)  03/03/24 246 lb (111.6 kg)  01/22/24 246 lb (111.6 kg)     GEN: Well nourished, well developed in no acute distress NECK: No JVD; No carotid bruits CARDIAC: Regular rate and rhythm, no murmurs, rubs, gallops RESPIRATORY:  Clear to auscultation without rales, wheezing or rhonchi  ABDOMEN: Soft,  non-tender, non-distended EXTREMITIES:  No edema; No deformity   ASSESSMENT AND PLAN:    CHB s/p Biotronik PPM  -Normal PPM function -See Pace Art report -No changes today  HFpEF  -GDMT per primary Cardiology  Will check BMET today and get back in with Dr. Odis Hollingshead  HTN Stable on current regimen    Disposition:   Follow up with EP APP in 12 months  Signed, Canary Brim, NP-C, AGACNP-BC [facility] - Electrophysiology  03/09/2024, 12:01 PM

## 2024-03-09 NOTE — Patient Instructions (Addendum)
 Medication Instructions:  Your physician recommends that you continue on your current medications as directed. Please refer to the Current Medication list given to you today.  *If you need a refill on your cardiac medications before your next appointment, please call your pharmacy*  Lab Work: BMET-TODAY If you have labs (blood work) drawn today and your tests are completely normal, you will receive your results only by: MyChart Message (if you have MyChart) OR A paper copy in the mail If you have any lab test that is abnormal or we need to change your treatment, we will call you to review the results.  Follow-Up: At [facility], you and your health needs are our priority.  As part of our continuing mission to provide you with exceptional heart care, our providers are all part of one team.  This team includes your primary Cardiologist (physician) and Advanced Practice Providers or APPs (Physician Assistants and Nurse Practitioners) who all work together to provide you with the care you need, when you need it.  Your next appointment:   1 year(s)  Provider:   You will see one of the following Advanced Practice Providers on your designated Care Team:   Francis Dowse, [place] Casimiro Needle "Mardelle Matte" [place], [place] Canary Brim, NP  Schedule 2-3 month follow up appointment with Dr Odis Hollingshead     1st Floor: - Lobby - Registration  - Pharmacy  - Lab - Cafe  2nd Floor: - PV Lab - Diagnostic Testing (echo, CT, nuclear med)  3rd Floor: - Vacant  4th Floor: - TCTS (cardiothoracic surgery) - AFib Clinic - Structural Heart Clinic - Vascular Surgery  - Vascular Ultrasound  5th Floor: - HeartCare Cardiology (general and EP) - Clinical Pharmacy for coumadin, hypertension, lipid, weight-loss medications, and med management appointments    Valet parking services will be available as well.

## 2024-03-10 ENCOUNTER — Other Ambulatory Visit: Payer: Self-pay | Admitting: Student

## 2024-03-10 DIAGNOSIS — I5032 Chronic diastolic (congestive) heart failure: Secondary | ICD-10-CM

## 2024-03-18 ENCOUNTER — Ambulatory Visit: Payer: Medicare Other

## 2024-03-18 DIAGNOSIS — I442 Atrioventricular block, complete: Secondary | ICD-10-CM | POA: Diagnosis not present

## 2024-03-18 LAB — CUP PACEART REMOTE DEVICE CHECK
Battery Voltage: 70
Date Time Interrogation Session: 20250410083024
Implantable Lead Connection Status: 753985
Implantable Lead Connection Status: 753985
Implantable Lead Implant Date: 20220727
Implantable Lead Implant Date: 20220727
Implantable Lead Location: 753859
Implantable Lead Location: 753860
Implantable Lead Model: 377171
Implantable Lead Model: 377171
Implantable Lead Serial Number: 8000456432
Implantable Lead Serial Number: 8000477614
Implantable Pulse Generator Implant Date: 20220727
Pulse Gen Model: 407145
Pulse Gen Serial Number: 70164524

## 2024-03-23 ENCOUNTER — Encounter: Payer: Self-pay | Admitting: Internal Medicine

## 2024-03-25 ENCOUNTER — Other Ambulatory Visit: Payer: Self-pay | Admitting: Student

## 2024-03-25 DIAGNOSIS — M5090 Cervical disc disorder, unspecified, unspecified cervical region: Secondary | ICD-10-CM

## 2024-03-25 DIAGNOSIS — G894 Chronic pain syndrome: Secondary | ICD-10-CM

## 2024-03-26 ENCOUNTER — Encounter: Payer: Self-pay | Admitting: Student

## 2024-03-28 MED ORDER — OXYCODONE-ACETAMINOPHEN 5-325 MG PO TABS: 1.0000 | ORAL_TABLET | Freq: Four times a day (QID) | ORAL | 0 refills | Status: DC | PRN

## 2024-04-25 ENCOUNTER — Encounter: Payer: Self-pay | Admitting: Student

## 2024-04-25 DIAGNOSIS — G894 Chronic pain syndrome: Secondary | ICD-10-CM

## 2024-04-25 DIAGNOSIS — M5090 Cervical disc disorder, unspecified, unspecified cervical region: Secondary | ICD-10-CM

## 2024-04-26 NOTE — Progress Notes (Signed)
 Remote pacemaker transmission.

## 2024-04-27 ENCOUNTER — Encounter: Payer: Self-pay | Admitting: Student

## 2024-04-27 MED ORDER — OXYCODONE-ACETAMINOPHEN 5-325 MG PO TABS: 1.0000 | ORAL_TABLET | Freq: Four times a day (QID) | ORAL | 0 refills | Status: DC | PRN

## 2024-04-29 ENCOUNTER — Ambulatory Visit (INDEPENDENT_AMBULATORY_CARE_PROVIDER_SITE_OTHER): Admitting: Student

## 2024-04-29 ENCOUNTER — Telehealth: Payer: Self-pay

## 2024-04-29 ENCOUNTER — Encounter: Payer: Self-pay | Admitting: Student

## 2024-04-29 VITALS — BP 120/76 | HR 82 | Ht 69.0 in | Wt 259.6 lb

## 2024-04-29 DIAGNOSIS — R0609 Other forms of dyspnea: Secondary | ICD-10-CM

## 2024-04-29 DIAGNOSIS — J449 Chronic obstructive pulmonary disease, unspecified: Secondary | ICD-10-CM

## 2024-04-29 MED ORDER — TRELEGY ELLIPTA 100-62.5-25 MCG/ACT IN AEPB: 1.0000 | INHALATION_SPRAY | Freq: Every day | RESPIRATORY_TRACT | 2 refills | Status: DC

## 2024-04-29 MED ORDER — FUROSEMIDE 40 MG PO TABS: 40.0000 mg | ORAL_TABLET | Freq: Every day | ORAL | 0 refills | Status: DC

## 2024-04-29 NOTE — Progress Notes (Deleted)
    SUBJECTIVE:   CHIEF COMPLAINT / HPI:   Follow up O2: - home reading 85%? - of note, sitting around 91-92% over last couple of months  - ongoing a couple of years    PERTINENT  PMH / PSH:  - COPD - uses ventolin  and Symbicort   - CHB s/p PPM - HTN - CHF  - PVD - tobacco use disorder  - GERD -   OBJECTIVE:   BP (!) 116/93   Pulse 82   Ht 5\' 9"  (1.753 m)   Wt 259 lb 9.6 oz (117.8 kg)   SpO2 92%   BMI 38.34 kg/m   General: Alert and oriented in no apparent distress Heart: Regular rate and rhythm with no murmurs appreciated Lungs: CTA bilaterally, no wheezing Abdomen: Bowel sounds present, no abdominal pain Skin: Warm and dry Extremities: No lower extremity edema   ASSESSMENT/PLAN:   Assessment & Plan      Ernestina Headland, MD [facility]

## 2024-04-29 NOTE — Progress Notes (Signed)
    SUBJECTIVE:   CHIEF COMPLAINT / HPI:   Follow up O2: - home reading 85%? - of note, sitting around 91-92% over last couple of months in different physician offices  - He is accompanied by his son. - He experiences worsening shortness of breath over the last several months, with home oxygen levels ranging from the seventies to eighties.  - In the clinic, his oxygen level is 92%.  - He has significant dyspnea with minimal exertion, such as walking 15 feet, and cyanosis during sleep. He has not been using home oxygen.  - He has COPD and uses Ventolin  and Symbicort  inhalers.  - He previously used Trelegy but discontinued due to insurance issues. - He recently quit smoking but has a long history of smoking.  - In 2016, he was hospitalized for pneumonia and intubated for a week. - He has not seen a pulmonologist in years.  He has a history of heart block and received a pacemaker in 2022 after a heart rate drop below 40 bpm. He experiences occasional chest pain and discomfort at the pacemaker site, with electric pulses and itching. He does not have typical heart attack pain but does have shortness of breath  He no longer sleeps in a bed due to discomfort, preferring a recliner, and can only sleep for about two hours in bed before needing to get up.   PERTINENT  PMH / PSH:   COPD   OBJECTIVE:   BP 120/76   Pulse 82   Ht 5\' 9"  (1.753 m)   Wt 259 lb 9.6 oz (117.8 kg)   SpO2 92%   BMI 38.34 kg/m   General: Alert and oriented in no apparent distress Heart: Regular rate and rhythm with no murmurs appreciated Lungs: Diminished breathing throughout, normal WOB when resting in chair  Abdomen: no abdominal pain Skin: Warm and dry Extremities: 1-2 + b/l lower extremity edema to calf  Palpable DP pulses   ASSESSMENT/PLAN:   Assessment & Plan Dyspnea on exertion Seems home oxygen saturations are incorrect. HOWEVER, this patient has multiple comorbidities and is chronically ill with  poor lung function at baseline. He definitely needs Trelegy instead of symbicort  in addition to rescue inhaler. No samples in office, ordered rx and instructed to call. He has been dealing with this worsening dyspnea over several months. Although gradually worsening on a curve. Weight up ten lbs, unknown dry weight but provided 3 days of lasix  40 mg daily to help with fluid retention. Additionally, refer to pulmonology and for sleep study. Will reach out to cardiologist as well to update. CBC, CMP, TSH, BNP ordered to assess for other causes of worsening symptoms. Likely multifactorial with OSA and COPD component. Return in 1 week, high admission risk, strict return precautions provided.      Ernestina Headland, MD [facility]

## 2024-04-29 NOTE — Patient Instructions (Addendum)
 It was great to see you today! Thank you for choosing [facility] for your primary care.  Today we addressed: I have ordered a referral to the lung doctors and for a sleep study  We will order labs today  We will start lasix  to take for the next few days, 40 mg daily I have scheduled you for follow up next week with another provider here  For inhaler, start the new inhaler and stop the symbicort  but can still use albuterol  as needed   If you haven't already, sign up for My Chart to have easy access to your labs results, and communication with your primary care physician.   Please arrive 15 minutes before your appointment to ensure smooth check in process.  We appreciate your efforts in making this happen.  Thank you for allowing me to participate in your care, Ernestina Headland, MD 04/29/2024, 4:06 PM PGY-3, [facility]

## 2024-04-29 NOTE — Telephone Encounter (Signed)
 Patient's wife, Alvy Baar, calls nurse line requesting prescription for pulse oximeter to be sent to [facility].   She reports that if rx is sent over, insurance will help pay for it.   Called [facility]. I was advised that this would not be covered through the pharmacy with insurance. They recommended that we place a DME order and send to DME company.   Forwarding to Dr. Eveleen Hinds.   Elsie Halo, RN

## 2024-04-30 ENCOUNTER — Encounter (HOSPITAL_BASED_OUTPATIENT_CLINIC_OR_DEPARTMENT_OTHER): Payer: Self-pay

## 2024-04-30 ENCOUNTER — Ambulatory Visit: Payer: Self-pay

## 2024-04-30 LAB — COMPREHENSIVE METABOLIC PANEL WITH GFR
ALT: 14 IU/L (ref 0–44)
AST: 19 IU/L (ref 0–40)
Albumin: 4.1 g/dL (ref 3.9–4.9)
Alkaline Phosphatase: 114 IU/L (ref 44–121)
BUN/Creatinine Ratio: 21 (ref 10–24)
BUN: 12 mg/dL (ref 8–27)
Bilirubin Total: 0.3 mg/dL (ref 0.0–1.2)
CO2: 25 mmol/L (ref 20–29)
Calcium: 9.2 mg/dL (ref 8.6–10.2)
Chloride: 100 mmol/L (ref 96–106)
Creatinine, Ser: 0.56 mg/dL — ABNORMAL LOW (ref 0.76–1.27)
Globulin, Total: 2.2 g/dL (ref 1.5–4.5)
Glucose: 82 mg/dL (ref 70–99)
Potassium: 4.7 mmol/L (ref 3.5–5.2)
Sodium: 139 mmol/L (ref 134–144)
Total Protein: 6.3 g/dL (ref 6.0–8.5)
eGFR: 109 mL/min/{1.73_m2} (ref >59)

## 2024-04-30 LAB — BRAIN NATRIURETIC PEPTIDE: BNP: 29.8 pg/mL (ref 0.0–100.0)

## 2024-04-30 LAB — TSH RFX ON ABNORMAL TO FREE T4: TSH: 1.26 u[IU]/mL (ref 0.450–4.500)

## 2024-04-30 LAB — CBC
Hematocrit: 49.2 % (ref 37.5–51.0)
Hemoglobin: 16.7 g/dL (ref 13.0–17.7)
MCH: 29.9 pg (ref 26.6–33.0)
MCHC: 33.9 g/dL (ref 31.5–35.7)
MCV: 88 fL (ref 79–97)
Platelets: 208 10*3/uL (ref 150–450)
RBC: 5.59 x10E6/uL (ref 4.14–5.80)
RDW: 12.9 % (ref 11.6–15.4)
WBC: 13.8 10*3/uL — ABNORMAL HIGH (ref 3.4–10.8)

## 2024-04-30 NOTE — Telephone Encounter (Signed)
 Patient has need for DME. I have ordered pulse ox. I am routing note for [facility] RN Pool.

## 2024-04-30 NOTE — Telephone Encounter (Signed)
 Chief Complaint: SOB Symptoms: SOB with exertion  Frequency: x 3-4 weeks Pertinent Negatives: Patient denies fever, CP, severe SOB Disposition: [] ED /[] Urgent Care (no appt availability in office) / [] Appointment(In office/virtual)/ []  [facility]/ [] Home Care/ [] Refused Recommended Disposition /[] [facility]/ [x]  Follow-up with PCP Additional Notes: Pt is calling with wife Alba Huddle, c/o increased SOB with exertion x 3-4 weeks. Pt reports hypoxia in low 80s at night with purple hue around the lips. Pt was seen by PCP yesterday, and was referred to [facility]. Of note, pt reports that SpO2 monitor may not be accurate and PCP is aware of current sx. Triager scheduled pt at soonest available at [facility]. Pt was previously Dr. Isabel Many pt but has not been seen in 5 years-- pt reports COPD has been managed by PCP. Triager also reinforced to follow up with PCP for current sx until pt can be established with [facility]. Patient verbalized understanding and to call back/go to ED with worsening symptoms.    Copied from CRM [id number]. Topic: Clinical - Red Word Triage >> Apr 30, 2024 11:15 AM Tyronne Galloway wrote: Red Word that prompted transfer to Nurse Triage: Pt has COPD and his symptoms have becoming largely worse in the last ten days. Pt has difficulty breathing and shortness of breath. Pt's oxygen dropping in 70s when sleeping. Reason for Disposition  [1] Longstanding difficulty breathing (e.g., CHF, COPD, emphysema) AND [2] WORSE than normal  Answer Assessment - Initial Assessment Questions 1. RESPIRATORY STATUS: "Describe your breathing?" (e.g., wheezing, shortness of breath, unable to speak, severe coughing)      SOB, hypoxia 2. ONSET: "When did this breathing problem begin?"      X3-4 weeks 3. PATTERN "Does the difficult breathing come and go, or has it been constant since it started?"      Comes and goes Worse in the evening 4. SEVERITY: "How bad is your breathing?" (e.g., mild,  moderate, severe)    - MILD: No SOB at rest, mild SOB with walking, speaks normally in sentences, can lie down, no retractions, pulse < 100.    - MODERATE: SOB at rest, SOB with minimal exertion and prefers to sit, cannot lie down flat, speaks in phrases, mild retractions, audible wheezing, pulse 100-120.    - SEVERE: Very SOB at rest, speaks in single words, struggling to breathe, sitting hunched forward, retractions, pulse > 120      Mild-moderate, SOB with exertion mostly 5. RECURRENT SYMPTOM: "Have you had difficulty breathing before?" If Yes, ask: "When was the last time?" and "What happened that time?"      PCP has been managing COPD 6. CARDIAC HISTORY: "Do you have any history of heart disease?" (e.g., heart attack, angina, bypass surgery, angioplasty)      Endorses having a pacemaker 7. LUNG HISTORY: "Do you have any history of lung disease?"  (e.g., pulmonary embolus, asthma, emphysema)     COPD 8. CAUSE: "What do you think is causing the breathing problem?"      COPD flare 9. OTHER SYMPTOMS: "Do you have any other symptoms? (e.g., dizziness, runny nose, cough, chest pain, fever)     Runny nose sometimes then to congestion, productive clear/thick with strings cough 10. O2 SATURATION MONITOR:  "Do you use an oxygen saturation monitor (pulse oximeter) at home?" If Yes, ask: "What is your reading (oxygen level) today?" "What is your usual oxygen saturation reading?" (e.g., 95%)       Pt reports issues with current oximeter - reports  low 80s in the evenings (endorses getting purple in the mouth while sleeping) 11. PREGNANCY: "Is there any chance you are pregnant?" "When was your last menstrual period?"       N/a 12. TRAVEL: "Have you traveled out of the country in the last month?" (e.g., travel history, exposures)       denies  Protocols used: Breathing Difficulty-A-AH

## 2024-05-04 ENCOUNTER — Telehealth: Payer: Self-pay

## 2024-05-04 ENCOUNTER — Ambulatory Visit: Payer: Self-pay | Admitting: Student

## 2024-05-04 NOTE — Telephone Encounter (Signed)
 Community message sent to [employer] for pulse oximeter.   Will await response.   Elsie Halo, RN

## 2024-05-04 NOTE — Telephone Encounter (Signed)
 Receipt confirmed by [employer].   Veronda Prude, RN

## 2024-05-05 ENCOUNTER — Observation Stay (HOSPITAL_COMMUNITY)
Admission: AD | Admit: 2024-05-05 | Discharge: 2024-05-06 | Disposition: A | Discharge status: Home / Self Care | Source: Ambulatory Visit | Attending: Family Medicine | Admitting: Family Medicine

## 2024-05-05 ENCOUNTER — Inpatient Hospital Stay (HOSPITAL_COMMUNITY)

## 2024-05-05 ENCOUNTER — Encounter (HOSPITAL_COMMUNITY): Payer: Self-pay | Admitting: Family Medicine

## 2024-05-05 ENCOUNTER — Other Ambulatory Visit: Payer: Self-pay

## 2024-05-05 ENCOUNTER — Ambulatory Visit (INDEPENDENT_AMBULATORY_CARE_PROVIDER_SITE_OTHER): Payer: Self-pay | Admitting: Student

## 2024-05-05 ENCOUNTER — Encounter (HOSPITAL_COMMUNITY): Payer: Self-pay

## 2024-05-05 VITALS — BP 118/75 | Temp 98.1°F | Ht 69.0 in | Wt 258.6 lb

## 2024-05-05 DIAGNOSIS — R0600 Dyspnea, unspecified: Secondary | ICD-10-CM | POA: Diagnosis present

## 2024-05-05 DIAGNOSIS — I509 Heart failure, unspecified: Secondary | ICD-10-CM | POA: Diagnosis not present

## 2024-05-05 DIAGNOSIS — K219 Gastro-esophageal reflux disease without esophagitis: Secondary | ICD-10-CM | POA: Insufficient documentation

## 2024-05-05 DIAGNOSIS — Z7984 Long term (current) use of oral hypoglycemic drugs: Secondary | ICD-10-CM | POA: Diagnosis not present

## 2024-05-05 DIAGNOSIS — J9601 Acute respiratory failure with hypoxia: Secondary | ICD-10-CM | POA: Diagnosis present

## 2024-05-05 DIAGNOSIS — J441 Chronic obstructive pulmonary disease with (acute) exacerbation: Secondary | ICD-10-CM | POA: Diagnosis not present

## 2024-05-05 DIAGNOSIS — R0609 Other forms of dyspnea: Secondary | ICD-10-CM | POA: Diagnosis present

## 2024-05-05 DIAGNOSIS — G8929 Other chronic pain: Secondary | ICD-10-CM | POA: Insufficient documentation

## 2024-05-05 DIAGNOSIS — Z789 Other specified health status: Secondary | ICD-10-CM

## 2024-05-05 DIAGNOSIS — Z79899 Other long term (current) drug therapy: Secondary | ICD-10-CM | POA: Insufficient documentation

## 2024-05-05 DIAGNOSIS — E785 Hyperlipidemia, unspecified: Secondary | ICD-10-CM | POA: Insufficient documentation

## 2024-05-05 DIAGNOSIS — I110 Hypertensive heart disease with heart failure: Secondary | ICD-10-CM | POA: Diagnosis not present

## 2024-05-05 DIAGNOSIS — F17210 Nicotine dependence, cigarettes, uncomplicated: Secondary | ICD-10-CM | POA: Insufficient documentation

## 2024-05-05 DIAGNOSIS — R0602 Shortness of breath: Secondary | ICD-10-CM | POA: Diagnosis present

## 2024-05-05 DIAGNOSIS — Z7982 Long term (current) use of aspirin: Secondary | ICD-10-CM | POA: Diagnosis not present

## 2024-05-05 DIAGNOSIS — Z950 Presence of cardiac pacemaker: Secondary | ICD-10-CM | POA: Diagnosis not present

## 2024-05-05 LAB — COMPREHENSIVE METABOLIC PANEL WITH GFR
ALT: 17 U/L (ref 0–44)
AST: 20 U/L (ref 15–41)
Albumin: 3.5 g/dL (ref 3.5–5.0)
Alkaline Phosphatase: 80 U/L (ref 38–126)
Anion gap: 11 (ref 5–15)
BUN: 8 mg/dL (ref 8–23)
CO2: 25 mmol/L (ref 22–32)
Calcium: 9 mg/dL (ref 8.9–10.3)
Chloride: 98 mmol/L (ref 98–111)
Creatinine, Ser: 0.61 mg/dL (ref 0.61–1.24)
GFR, Estimated: >60 mL/min (ref >60)
Glucose, Bld: 119 mg/dL — ABNORMAL HIGH (ref 70–99)
Potassium: 4.4 mmol/L (ref 3.5–5.1)
Sodium: 134 mmol/L — ABNORMAL LOW (ref 135–145)
Total Bilirubin: 0.8 mg/dL (ref 0.0–1.2)
Total Protein: 6.4 g/dL — ABNORMAL LOW (ref 6.5–8.1)

## 2024-05-05 LAB — CBC
HCT: 51.6 % (ref 39.0–52.0)
Hemoglobin: 16.6 g/dL (ref 13.0–17.0)
MCH: 29.6 pg (ref 26.0–34.0)
MCHC: 32.2 g/dL (ref 30.0–36.0)
MCV: 92.1 fL (ref 80.0–100.0)
Platelets: 209 10*3/uL (ref 150–400)
RBC: 5.6 MIL/uL (ref 4.22–5.81)
RDW: 14 % (ref 11.5–15.5)
WBC: 15.6 10*3/uL — ABNORMAL HIGH (ref 4.0–10.5)
nRBC: 0 % (ref 0.0–0.2)

## 2024-05-05 LAB — MAGNESIUM: Magnesium: 2 mg/dL (ref 1.7–2.4)

## 2024-05-05 LAB — HIV ANTIBODY (ROUTINE TESTING W REFLEX): HIV Screen 4th Generation wRfx: NONREACTIVE

## 2024-05-05 MED ORDER — ACETAMINOPHEN 650 MG RE SUPP: 650.0000 mg | Freq: Four times a day (QID) | RECTAL | Status: DC | PRN

## 2024-05-05 MED ORDER — ASPIRIN 81 MG PO TBEC
81.0000 mg | DELAYED_RELEASE_TABLET | Freq: Every day | ORAL | Status: DC
  Administered 2024-05-06: 81 mg via ORAL
  Filled 2024-05-05: qty 1

## 2024-05-05 MED ORDER — DICLOFENAC SODIUM 1 % EX GEL
2.0000 g | Freq: Four times a day (QID) | CUTANEOUS | Status: DC
  Filled 2024-05-05: qty 100

## 2024-05-05 MED ORDER — IPRATROPIUM-ALBUTEROL 0.5-2.5 (3) MG/3ML IN SOLN: 3.0000 mL | Freq: Four times a day (QID) | RESPIRATORY_TRACT | Status: DC

## 2024-05-05 MED ORDER — ACETAMINOPHEN 500 MG PO TABS: 1000.0000 mg | ORAL_TABLET | Freq: Four times a day (QID) | ORAL | Status: DC | PRN

## 2024-05-05 MED ORDER — DULOXETINE HCL 60 MG PO CPEP
60.0000 mg | ORAL_CAPSULE | Freq: Every day | ORAL | Status: DC
  Administered 2024-05-05: 60 mg via ORAL
  Filled 2024-05-05: qty 1

## 2024-05-05 MED ORDER — DICLOFENAC SODIUM 1 % EX GEL: 2.0000 g | Freq: Four times a day (QID) | CUTANEOUS | Status: DC | PRN

## 2024-05-05 MED ORDER — NYSTATIN 100000 UNIT/GM EX CREA: TOPICAL_CREAM | Freq: Two times a day (BID) | CUTANEOUS | Status: DC | PRN

## 2024-05-05 MED ORDER — AZITHROMYCIN 250 MG PO TABS
500.0000 mg | ORAL_TABLET | Freq: Every day | ORAL | Status: DC
  Administered 2024-05-05 – 2024-05-06 (×2): 500 mg via ORAL
  Filled 2024-05-05 (×2): qty 2

## 2024-05-05 MED ORDER — ATORVASTATIN CALCIUM 80 MG PO TABS
80.0000 mg | ORAL_TABLET | Freq: Every day | ORAL | Status: DC
  Administered 2024-05-05: 80 mg via ORAL
  Filled 2024-05-05: qty 1

## 2024-05-05 MED ORDER — AMLODIPINE BESYLATE 5 MG PO TABS
5.0000 mg | ORAL_TABLET | Freq: Every day | ORAL | Status: DC
  Administered 2024-05-06: 5 mg via ORAL
  Filled 2024-05-05: qty 1

## 2024-05-05 MED ORDER — EMPAGLIFLOZIN 10 MG PO TABS
10.0000 mg | ORAL_TABLET | Freq: Every day | ORAL | Status: DC
  Administered 2024-05-06: 10 mg via ORAL
  Filled 2024-05-05: qty 1

## 2024-05-05 MED ORDER — EZETIMIBE 10 MG PO TABS
10.0000 mg | ORAL_TABLET | Freq: Every day | ORAL | Status: DC
  Administered 2024-05-05: 10 mg via ORAL
  Filled 2024-05-05: qty 1

## 2024-05-05 MED ORDER — METOPROLOL SUCCINATE ER 100 MG PO TB24
100.0000 mg | ORAL_TABLET | Freq: Every day | ORAL | Status: DC
  Administered 2024-05-06: 100 mg via ORAL
  Filled 2024-05-05: qty 1

## 2024-05-05 MED ORDER — IPRATROPIUM-ALBUTEROL 0.5-2.5 (3) MG/3ML IN SOLN
3.0000 mL | Freq: Four times a day (QID) | RESPIRATORY_TRACT | Status: DC
  Administered 2024-05-05: 3 mL via RESPIRATORY_TRACT
  Filled 2024-05-05: qty 3

## 2024-05-05 MED ORDER — PANTOPRAZOLE SODIUM 40 MG PO TBEC
40.0000 mg | DELAYED_RELEASE_TABLET | Freq: Every day | ORAL | Status: DC
  Filled 2024-05-05: qty 1

## 2024-05-05 MED ORDER — ALBUTEROL SULFATE HFA 108 (90 BASE) MCG/ACT IN AERS: 1.0000 | INHALATION_SPRAY | RESPIRATORY_TRACT | Status: DC | PRN

## 2024-05-05 MED ORDER — PREDNISONE 20 MG PO TABS
40.0000 mg | ORAL_TABLET | Freq: Every day | ORAL | Status: DC
  Administered 2024-05-05 – 2024-05-06 (×2): 40 mg via ORAL
  Filled 2024-05-05 (×2): qty 2

## 2024-05-05 MED ORDER — NYSTATIN 100000 UNIT/GM EX CREA: TOPICAL_CREAM | Freq: Two times a day (BID) | CUTANEOUS | Status: DC

## 2024-05-05 MED ORDER — PANTOPRAZOLE SODIUM 40 MG PO TBEC: 40.0000 mg | DELAYED_RELEASE_TABLET | Freq: Every day | ORAL | Status: DC | PRN

## 2024-05-05 MED ORDER — ALBUTEROL SULFATE (2.5 MG/3ML) 0.083% IN NEBU: 2.5000 mg | INHALATION_SOLUTION | Freq: Four times a day (QID) | RESPIRATORY_TRACT | Status: DC | PRN

## 2024-05-05 MED ORDER — SACUBITRIL-VALSARTAN 49-51 MG PO TABS
1.0000 | ORAL_TABLET | Freq: Two times a day (BID) | ORAL | Status: DC
  Administered 2024-05-05 – 2024-05-06 (×2): 1 via ORAL
  Filled 2024-05-05 (×3): qty 1

## 2024-05-05 MED ORDER — ENOXAPARIN SODIUM 40 MG/0.4ML IJ SOSY
40.0000 mg | PREFILLED_SYRINGE | INTRAMUSCULAR | Status: DC
  Administered 2024-05-05: 40 mg via SUBCUTANEOUS
  Filled 2024-05-05: qty 0.4

## 2024-05-05 MED ORDER — OXYCODONE HCL 5 MG PO TABS
5.0000 mg | ORAL_TABLET | Freq: Four times a day (QID) | ORAL | Status: DC | PRN
  Administered 2024-05-05 – 2024-05-06 (×3): 5 mg via ORAL
  Filled 2024-05-05 (×3): qty 1

## 2024-05-05 MED ORDER — NICOTINE 14 MG/24HR TD PT24
14.0000 mg | MEDICATED_PATCH | Freq: Every day | TRANSDERMAL | Status: DC
  Administered 2024-05-05 – 2024-05-06 (×2): 14 mg via TRANSDERMAL
  Filled 2024-05-05 (×2): qty 1

## 2024-05-05 NOTE — Assessment & Plan Note (Addendum)
 Possibly worsening in the last couple of weeks but timeline from pt is unclear. On chart review, it does seem that he may be at his baseline functional level. Possible COPD exacerbation, but no sputum changes per pt but will cover with abx. - Admit to FMTS, attending Dr. Alverna John - Med tele, Vital signs per floor - CXR - CBC, CMP - PT/OT to treat - AM CBC, BMP - Fall precautions - DuoNebs q6h scheduled - Prednisone  40 mg x5d - Azithro 500 mg x3d - Daily weights - Strict I&Os

## 2024-05-05 NOTE — H&P (Addendum)
 Hospital Admission History and Physical Service Pager: [phone]  Patient name: George Leon Medical record number: [id number] Date of Birth: [birth year] Age: 68 y.o. Gender: male  Primary Care Provider: Goble Last, MD Consultants: None Code Status: FULL which was confirmed with family if patient unable to confirm  Preferred Emergency Contact:  Contact Information     Name Relation Home Work Mobile   [place] Spouse [phone]  [phone]   George Leon, George Leon   [phone]      Other Contacts   None on File     Chief Complaint: shortness of breath  Assessment and Plan: George Leon is a 68 y.o. male presenting with dyspnea on exertion . Differential for presentation of this includes:  - COPD exacerbation most likely given symptoms, new O2 requirement. - CHF exacerbation and PE less likely; will obtain CXR. No evidence of fluid overload on exam; weights collected at PCP office do not support patient suspicion that he has gained 10 pounds in the last week. - PNA less likely without fever, worsening resp symptoms. Stable leukocytosis.  Assessment & Plan Dyspnea on exertion Possibly worsening in the last couple of weeks but timeline from pt is unclear. On chart review, it does seem that he may be at his baseline functional level. Possible COPD exacerbation, but no sputum changes per pt but will cover with abx. - Admit to FMTS, attending Dr. Alverna John - Med tele, Vital signs per floor - CXR - CBC, CMP - PT/OT to treat - AM CBC, BMP - Fall precautions - DuoNebs q6h scheduled - Prednisone  40 mg x5d - Azithro 500 mg x3d - Daily weights - Strict I&Os Chronic health problem HLD: Continue home atorvastatin  80 mg daily Chronic pain: Continue home oxycodone  5 mg q6h PRN, Cymbalta  60 mg daily, Voltaren  gel PRN. GERD: Continue home Nexium  40 mg daily (formulary equivalent - pantoprazole  40 mg) CHF: Continue home Entresto  49-51 mg twice daily,  Toprol -XL 100 mg daily, Jardiance  10 mg daily H/o MI: Continue home ASA 81 mg daily  FEN/GI: Heart healthy diet VTE Prophylaxis: Lovenox   Disposition: Med tele  History of Present Illness:  George Leon is a 68 y.o. male presenting from home as a direct admit after being seen in PCP clinic earlier today.  At that time he was noted to be quite hypoxic requiring 4L of oxygen to elevate his sats to 91%.  He was encouraged to present to the ED however patient strongly desired to go home; the agreement was finally reached that if he could be direct admitted he would certainly return to the hospital for this care.  On arrival to the hospital and initial evaluation by FMTS patient is not having difficulty breathing.  He denies chest pain/pressure, shortness of breath, abdominal pain, LE swelling.  He is not sure when his breathing may have gotten worse, he has felt like this for a long time.  No changes to sputum production or cough.  He does feel like his belly is a little bit more distended than usual and reports that he has gained 10 pounds in the last week.  No recent illnesses.  Review Of Systems: Per HPI.  Pertinent Past Medical History: Pulmonary HTN, HTN, HLD, GERD, COPD, complete heart block with pacemaker, CHF, PVD Remainder reviewed in history tab.   Pertinent Past Surgical History: Pacemaker implantation (2022), multiple joint replacements Remainder reviewed in history tab.   Pertinent Social History: Tobacco use: Yes - 1 PPD Alcohol use: social  drinker - states last drinks was "a couple of years ago" Other Substance use: marijuana Lives with wife  Pertinent Family History: Father-HTN, AAA Remainder reviewed in history tab.   Important Outpatient Medications: Amlodipine  5 mg daily Aspirin  81 mg daily Percocet 5-325 mg q6h PRN Atorvastatin  80 mg daily Cymbalta  60 mg daily Jardiance  10 mg daily Nexium  40 mg daily Zetia  10 mg daily Toprol -XL 100 mg daily Entresto  49-51  mg twice daily Trelegy inhaler Voltaren  gel as needed Nystatin  cream PRN Remainder reviewed in medication history.   Objective: BP 138/80 (BP Location: Left Arm)   Pulse 80   Temp 97.8 F (36.6 C) (Oral)   Resp 18   SpO2 93%  Exam: General: Obese older male, comfortable-appearing, no acute distress. HEENT: normocephalic, PERRLA, EOM grossly intact, MMM. Cardio: Regular rate, regular rhythm, no murmurs on exam. Pulm: Clear, no wheezing, no crackles. No increased work of breathing room air. Abdominal: bowel sounds present, soft, non-tender, non-distended. Extremities: Very minimal peripheral edema. Moves all extremities equally. Neuro: Alert and oriented x3, speech normal in content, no facial asymmetry. Psych:  Cognition and judgment appear intact. Alert, communicative, and cooperative.  Labs:  CBC BMET  Recent Labs  Lab 04/29/24 1615  WBC 13.8*  HGB 16.7  HCT 49.2  PLT 208   Recent Labs  Lab 04/29/24 1615  NA 139  K 4.7  CL 100  CO2 25  BUN 12  CREATININE 0.56*  GLUCOSE 82  CALCIUM  9.2      Omar Bibber, DO 05/05/2024, 5:23 PM PGY-1, [facility]  FPTS Intern pager: [phone], text pages welcome Secure chat group [facility] Teaching Service

## 2024-05-05 NOTE — Progress Notes (Signed)
  SUBJECTIVE:   CHIEF COMPLAINT / HPI:   Follow-up regarding dyspnea on exertion: Seen by Dr. Eveleen Hinds on 5/22.  He was provided 3 days of Lasix  40 mg daily, Rx sent for Trelegy.  Lab work overall unremarkable with exception of leukocytosis of 13.8 however he does have a persistent leukocytosis.  [employer] has sent pulse ox for him given his incorrect readings at home.  He has started taking Trelegy and did complete Lasix .  He does not feel that there have been any changes so far with taking his medications.  Denies chest pain.  PERTINENT  PMH / PSH: Peripheral vascular disease, pulmonary HTN, HLD, HTN, GERD, COPD, complete heart block with pacemaker, CHF  OBJECTIVE:  BP 118/75   Temp 98.1 F (36.7 C)   Ht 5\' 9"  (1.753 m)   Wt 258 lb 9.6 oz (117.3 kg)   SpO2 90% Comment: after 4 liters of oxygen  BMI 38.19 kg/m  General: NAD CV: RRR, no murmurs appreciable Pulm: Diminished breath sounds bilaterally on 4 L [place]  ASSESSMENT/PLAN:   Assessment & Plan Dyspnea on exertion Worsened from previously with new oxygen requirement of 4 L during exertion.  He is sats at 91% without room air at rest.  Given no improvement with Trelegy and Lasix , and new oxygen requirement, recommend ED workup.  His previous leukocytosis likely does not have infectious origin given he does not have new infectious appearance.  Patient declined transfer to ED.  Counseled on risks of ACS should he go home without oxygen.  Attending Dr. Drue Gerald contacted bed placement who will make us  aware when bed is available and patient will proceed to [facility] to be admitted.  Recommend workup for dyspnea on exertion and hypoxia and at the very least will need assistance with oxygen requirement.  Veronia Goon, DO 05/05/2024, 10:35 AM PGY-3, [facility]

## 2024-05-05 NOTE — Plan of Care (Signed)
   Problem: Clinical Measurements: Goal: Respiratory complications will improve Outcome: Progressing   Problem: Safety: Goal: Ability to remain free from injury will improve Outcome: Progressing

## 2024-05-05 NOTE — Assessment & Plan Note (Signed)
 HLD: Continue home atorvastatin  80 mg daily Chronic pain: Continue home oxycodone  5 mg q6h PRN, Cymbalta  60 mg daily, Voltaren  gel PRN. GERD: Continue home Nexium  40 mg daily (formulary equivalent - pantoprazole  40 mg) CHF: Continue home Entresto  49-51 mg twice daily, Toprol -XL 100 mg daily, Jardiance  10 mg daily H/o MI: Continue home ASA 81 mg daily

## 2024-05-05 NOTE — Plan of Care (Signed)
 FMTS Brief Progress Note  S: Saw patient at bedside for night rounds.  Patient reports symptoms are much improved after receiving his breathing treatment few minutes ago.  Said his dyspnea has been going on for quite a while however has been worse in the last 2-4 weeks.  Additionally we have oxygen as low as low 80s but he realized he was using the sports pulse ox which was told usually reads low.  Also reports neck exacerbations with minimal activities.  Extensive smoking history but quit smoking and has been told in the past that he has emphysema, Bronchitis and COPD. No on home oxygen. Denies any chest pain, orthopnea or lower extremity edema   O: BP 126/74 (BP Location: Left Arm)   Pulse 84   Temp 97.8 F (36.6 C) (Oral)   Resp 17   SpO2 92%   EXAM  General: No active distress, well-appearing Respiratory: Normal work of breathing on RA Cardiac: Well-perfused Extremities: No lower extremity edema   A/P: Hypoxia  Dyspnea  Admitted for hypoxia suspected to be 2/2 COPD exacerbation given patient's smoking history although he has discontinued.  He has good work of breathing and reassuring O2 sats. Could also have underlying deconditioning given sedimentary life style - Will continue monitoring respiratory status - SpO2 goal of 88-92% - Oxygen supplementation as needed - Orders reviewed. Labs for AM ordered, which was adjusted as needed.   Goble Last, MD 05/05/2024, 8:23 PM PGY-3, [facility] Night Resident  Please page [phone] with questions.

## 2024-05-05 NOTE — Progress Notes (Signed)
  FMTS Attending Admission Note: George Bob, MD  Personal pager:  [phone] FPTS Service Pager:  [phone]   I  have personally seen and examined this patient, reviewed their chart and results. I have discussed this patient with the resident. I will sign resident H&P when available.  Acute hypoxic respiratory failure with dyspnea on exertion- while satting 91% on room air, he drops to low 80s with exertion requiring 4L [place] O2 to return to baseline in clinic today. Denies infectious symptoms, swelling, chest pain or palpitations. Worsening dyspnea on exertion for several months. Agree with direct admission for lab work (troponins, BMP/CBC, VBG), CXR, ECG, and ambulatory pulse ox. Consider scheduled DuoNebs and steroids in case of COPD exacerbation although no increased sputum/change in sputum quality, has recently been escalated to Trelegy but reports no improvement. Recent BNP negative and no improvement with lasix  last week making CHF less likely, repeat ECHO due to cardiac history. Also will need sleep study outpatient, family reports blue lips/cyanosis at night when sleeping.  Leukocytosis- chronic, would get smear. Without infectious symptoms.

## 2024-05-05 NOTE — Hospital Course (Signed)
 FB is a 68yo M w/ hx of COPD, pulm HTN, HTN, HLD, complete heart block with pacemaker, CHF, PVD that was admitted for acute hypoxic respiratory failure.  AHRF  COPD Exacerbation  Pt presented to PCP with worsening wheezing and new oxygen requirement. Pt was direct admitted to [facility] service. Pt was satting well on RA, but desatted w/ exertion. CXR showed pulmonary edema versus viral infection. Pt was diagnosed w/ COPD exacerbation and treated steroids (prednisone  x5days, last dose 6/1). Pt was also treated w/ duonebs and home Trelegy (formulary alternative). At time of discharge, pt was still requiring 2L to maintain 92%, so was sent home w/ home oxygen.    PCP Recommendations 1) Monitor for improvement of acute COPD exac. Pt may need chronic O2 at baseline.

## 2024-05-06 ENCOUNTER — Encounter: Payer: Self-pay | Admitting: Student

## 2024-05-06 ENCOUNTER — Other Ambulatory Visit (HOSPITAL_COMMUNITY): Payer: Self-pay

## 2024-05-06 DIAGNOSIS — J441 Chronic obstructive pulmonary disease with (acute) exacerbation: Secondary | ICD-10-CM | POA: Diagnosis not present

## 2024-05-06 LAB — BASIC METABOLIC PANEL WITH GFR
Anion gap: 9 (ref 5–15)
BUN: 14 mg/dL (ref 8–23)
CO2: 29 mmol/L (ref 22–32)
Calcium: 9.7 mg/dL (ref 8.9–10.3)
Chloride: 97 mmol/L — ABNORMAL LOW (ref 98–111)
Creatinine, Ser: 0.71 mg/dL (ref 0.61–1.24)
GFR, Estimated: >60 mL/min (ref >60)
Glucose, Bld: 107 mg/dL — ABNORMAL HIGH (ref 70–99)
Potassium: 4.9 mmol/L (ref 3.5–5.1)
Sodium: 135 mmol/L (ref 135–145)

## 2024-05-06 LAB — CBC
HCT: 51.2 % (ref 39.0–52.0)
Hemoglobin: 16.8 g/dL (ref 13.0–17.0)
MCH: 29.8 pg (ref 26.0–34.0)
MCHC: 32.8 g/dL (ref 30.0–36.0)
MCV: 90.9 fL (ref 80.0–100.0)
Platelets: 229 10*3/uL (ref 150–400)
RBC: 5.63 MIL/uL (ref 4.22–5.81)
RDW: 13.9 % (ref 11.5–15.5)
WBC: 16.8 10*3/uL — ABNORMAL HIGH (ref 4.0–10.5)
nRBC: 0 % (ref 0.0–0.2)

## 2024-05-06 MED ORDER — IPRATROPIUM-ALBUTEROL 0.5-2.5 (3) MG/3ML IN SOLN
3.0000 mL | Freq: Three times a day (TID) | RESPIRATORY_TRACT | Status: DC
  Administered 2024-05-06: 3 mL via RESPIRATORY_TRACT
  Filled 2024-05-06: qty 3

## 2024-05-06 MED ORDER — AZITHROMYCIN 500 MG PO TABS
500.0000 mg | ORAL_TABLET | Freq: Every day | ORAL | 0 refills | Status: AC
  Filled 2024-05-06: qty 1, 1d supply, fill #0

## 2024-05-06 MED ORDER — PREDNISONE 10 MG PO TABS
40.0000 mg | ORAL_TABLET | Freq: Every day | ORAL | 0 refills | Status: AC
  Filled 2024-05-06: qty 12, 3d supply, fill #0

## 2024-05-06 MED ORDER — IPRATROPIUM-ALBUTEROL 0.5-2.5 (3) MG/3ML IN SOLN: 3.0000 mL | Freq: Two times a day (BID) | RESPIRATORY_TRACT | Status: DC

## 2024-05-06 NOTE — Care Management Obs Status (Signed)
 MEDICARE OBSERVATION STATUS NOTIFICATION   Patient Details  Name: George Leon MRN: [id number] Date of Birth: 1956-05-19   Medicare Observation Status Notification Given:       Terre Ferri, RN 05/06/2024, 12:54 PM

## 2024-05-06 NOTE — Progress Notes (Addendum)
 SATURATION QUALIFICATIONS: (This note is used to comply with regulatory documentation for home oxygen)  Patient Saturations on Room Air at Rest = 92%  Patient Saturations on Room Air while Ambulating = 86% after 20 ft of walking  Patient Saturations on 2 Liters of oxygen while Ambulating = 92%  Please briefly explain why patient needs home oxygen: Patient has COPD. When walking moderate to long distances patient drops to 86% or lower.

## 2024-05-06 NOTE — Evaluation (Signed)
 Physical Therapy Evaluation Patient Details Name: George Leon MRN: [id number] DOB: 09-Dec-1956 Today's Date: 05/06/2024  History of Present Illness  Pt is a 68 y.o. male admitted 5/28 for hypoxia while at his PCP, requiring 4L of O2. PMH: COPD, CHF  Clinical Impression  George Leon is 68 y.o. male admitted with above HPI and diagnosis. Patient is currently limited by functional impairments below (see PT problem list). Patient lives with significant other and is mod ind with SPC for mobility at baseline. Patient seated EOB at start and eager to discharge home but agreeable to mobilize. CGA/supervision provided for safety with transfers and gait. Pt amb short bout in room to bathroom with no AD and then amb ~200' with SPC in hall. Pt on 2L/min as desaturating on RA with activity. Single step up completed with SPC and bed rial (similar height as column at home) for bil UE support. No LOB with ascend/descend 6 " step. Patient will benefit from continued skilled PT interventions to address impairments and progress independence with mobility, recommending HHPT follow up. Acute PT will follow and progress as able.         If plan is discharge home, recommend the following: Assist for transportation;Help with stairs or ramp for entrance;Assistance with cooking/housework   Can travel by private vehicle        Equipment Recommendations None recommended by PT  Recommendations for Other Services       Functional Status Assessment Patient has had a recent decline in their functional status and demonstrates the ability to make significant improvements in function in a reasonable and predictable amount of time.     Precautions / Restrictions Precautions Precautions: Fall Recall of Precautions/Restrictions: Intact Restrictions Weight Bearing Restrictions Per Provider Order: No      Mobility  Bed Mobility Overal bed mobility: Needs Assistance             General bed mobility  comments: Received  and returned sitting EOB    Transfers Overall transfer level: Needs assistance Equipment used: Straight cane Transfers: Sit to/from Stand, Bed to chair/wheelchair/BSC Sit to Stand: Supervision           General transfer comment: CGA/sup for sit<>stand from EOB with no AD and with SPC.    Ambulation/Gait Ambulation/Gait assistance: Contact guard assist, Supervision Gait Distance (Feet): 200 Feet Assistive device: Straight cane Gait Pattern/deviations: Step-through pattern, Decreased stride length, Trunk flexed, Wide base of support Gait velocity: decr     General Gait Details: CGA for safety with SPC fading to supervision. Pt with 1x LOB when pulling pants up while walking and cues/education to stop and take time to fix pants to reduce risk of falls.  Stairs Stairs: Yes Stairs assistance: Contact guard assist Stair Management: One rail Right, Step to pattern, Forwards, With cane Number of Stairs: 1 General stair comments: bed rail for height of coum at front steps. no LOB, CGA for safety. step to pattern.  Wheelchair Mobility     Tilt Bed    Modified Rankin (Stroke Patients Only)       Balance Overall balance assessment: Needs assistance Sitting-balance support: Bilateral upper extremity supported, Feet supported Sitting balance-Leahy Scale: Good     Standing balance support: Single extremity supported, During functional activity, Reliant on assistive device for balance Standing balance-Leahy Scale: Fair Standing balance comment: Can stand statically no UE support  Pertinent Vitals/Pain Pain Assessment Pain Assessment: No/denies pain    Home Living Family/patient expects to be discharged to:: Private residence Living Arrangements: Spouse/significant other;Children Available Help at Discharge: Family;Available 24 hours/day Type of Home: House Home Access: Stairs to enter Entrance Stairs-Rails:  None Entrance Stairs-Number of Steps: 4   Home Layout: One level;Laundry or work area in [employer] Equipment: Agricultural consultant (2 wheels);Cane - single point;Wheelchair - manual;BSC/3in1;Shower seat      Prior Function Prior Level of Function : Independent/Modified Independent;History of Falls (last six months)             Mobility Comments: Endorses approx 5 falls in last 6 months       Extremity/Trunk Assessment   Upper Extremity Assessment Upper Extremity Assessment: Defer to OT evaluation;Generalized weakness    Lower Extremity Assessment Lower Extremity Assessment: Generalized weakness    Cervical / Trunk Assessment Cervical / Trunk Assessment: Kyphotic  Communication   Communication Communication: No apparent difficulties    Cognition Arousal: Alert Behavior During Therapy: WFL for tasks assessed/performed   PT - Cognitive impairments: No apparent impairments                         Following commands: Intact       Cueing Cueing Techniques: Verbal cues     General Comments General comments (skin integrity, edema, etc.): Pt received on RA stating at 90, with short ambulation on RA pt destats to    Exercises     Assessment/Plan    PT Assessment Patient needs continued PT services  PT Problem List Decreased strength;Decreased activity tolerance;Decreased balance;Decreased mobility;Decreased knowledge of use of DME;Decreased safety awareness;Decreased knowledge of precautions;Cardiopulmonary status limiting activity;Obesity       PT Treatment Interventions DME instruction;Patient/family education;Neuromuscular re-education;Balance training;Therapeutic exercise;Therapeutic activities;Functional mobility training;Stair training;Gait training    PT Goals (Current goals can be found in the Care Plan section)  Acute Rehab PT Goals Patient Stated Goal: go home ASAP PT Goal Formulation: With patient Time For Goal Achievement:  05/20/24 Potential to Achieve Goals: Good    Frequency Min 1X/week     Co-evaluation               AM-PAC PT "6 Clicks" Mobility  Outcome Measure Help needed turning from your back to your side while in a flat bed without using bedrails?: None Help needed moving from lying on your back to sitting on the side of a flat bed without using bedrails?: None Help needed moving to and from a bed to a chair (including a wheelchair)?: A Little Help needed standing up from a chair using your arms (e.g., wheelchair or bedside chair)?: A Little Help needed to walk in hospital room?: A Little Help needed climbing 3-5 steps with a railing? : A Little 6 Click Score: 20    End of Session Equipment Utilized During Treatment: Gait belt;Oxygen Activity Tolerance: Patient tolerated treatment well Patient left: in bed;with call bell/phone within reach Nurse Communication: Mobility status PT Visit Diagnosis: Unsteadiness on feet (R26.81);Muscle weakness (generalized) (M62.81);Difficulty in walking, not elsewhere classified (R26.2)    Time: 1610-9604 PT Time Calculation (min) (ACUTE ONLY): 23 min   Charges:   PT Evaluation $PT Eval Low Complexity: 1 Low PT Treatments $Gait Training: 8-22 mins PT General Charges $$ ACUTE PT VISIT: 1 Visit         Tish Forge, DPT Acute Rehabilitation Services Office [phone]  05/06/24 4:11 PM

## 2024-05-06 NOTE — Discharge Summary (Addendum)
 Family Medicine [facility] Discharge Summary  Patient name: George Leon Medical record number: [id number] Date of birth: 02/08/1956 Age: 68 y.o. Gender: male Date of Admission: 05/05/2024  Date of Discharge: 05/06/2024 Admitting Physician: Verdell Given, MD  Primary Care Provider: Goble Last, MD Consultants: None  Indication for Hospitalization: COPD exacerbation, acute hypoxemic respiratory failure  Discharge Diagnoses/Problem List:  Principal Problem for Admission: COPD exacerbation, acute hypoxemic respiratory failure Other Problems addressed during stay:  Principal Problem:   COPD exacerbation (HCC) Active Problems:   Acute hypoxemic respiratory failure (HCC)   Chronic health problem   Dyspnea    Brief Hospital Course:  FB is a 68yo M w/ hx of COPD, pulm HTN, HTN, HLD, complete heart block with pacemaker, CHF, PVD that was admitted for acute hypoxic respiratory failure.  AHRF  COPD Exacerbation  Pt presented to PCP with worsening wheezing and new oxygen requirement. Pt was direct admitted to [facility] service. Pt was satting well on RA, but desatted w/ exertion. CXR showed pulmonary edema versus viral infection. Pt was diagnosed w/ COPD exacerbation and treated steroids (prednisone  x5days, last dose 6/1). Pt was also treated w/ duonebs and home Trelegy (formulary alternative). At time of discharge, pt was still requiring 2L to maintain 92%, so was sent home w/ home oxygen.    PCP Recommendations 1) Monitor for improvement of acute COPD exac. Pt may need chronic O2 at baseline.   Disposition: Home with oxygen  Discharge Condition: Stable  Discharge Exam:  Vitals:   05/06/24 0806 05/06/24 0909  BP:  130/80  Pulse:  71  Resp:  18  Temp:  (!) 97.5 F (36.4 C)  SpO2: 92% 92%   General: Calm, cooperative, not in acute distress Pulmonary: Normal effort, clear to auscultation including no wheezing Cardio: Normal rate and rhythm.  Normal heart  sounds. Abdominal: Soft and nontender Extremities: Trace lower extremity and bilaterally (chronic), warm and dry, no wounds  Significant Procedures: None  Significant Labs and Imaging:  Recent Labs  Lab 05/05/24 1805 05/06/24 0659  WBC 15.6* 16.8*  HGB 16.6 16.8  HCT 51.6 51.2  PLT 209 229   Recent Labs  Lab 05/05/24 1805 05/06/24 0903  NA 134* 135  K 4.4 4.9  CL 98 97*  CO2 25 29  GLUCOSE 119* 107*  BUN 8 14  CREATININE 0.61 0.71  CALCIUM  9.0 9.7  MG 2.0  --   ALKPHOS 80  --   AST 20  --   ALT 17  --   ALBUMIN 3.5  --     Chest x-ray 5/18 1. Pulmonary edema versus viral infection. 2.  Aortic Atherosclerosis (ICD10-I70.0).  Results/Tests Pending at Time of Discharge: None  Discharge Medications:  Allergies as of 05/06/2024       Reactions   Adhesive [tape] Rash   Paper tape is what bothers him Pulled skin off also   Hydrochlorothiazide Other (See Comments)   Back Pain & gives him kidney stones   Sulfamethoxazole Swelling, Other (See Comments)   Tongue Swelling. Pt states he has had a sulfa drug since and has tolerated them.        Medication List     TAKE these medications    albuterol  108 (90 Base) MCG/ACT inhaler Commonly known as: VENTOLIN  HFA USE 1 TO 2 INHALATIONS EVERY 4 HOURS AS NEEDED FOR SHORTNESS OF BREATH/WHEEZE/COUGH.   amLODipine  5 MG tablet Commonly known as: NORVASC  TAKE 1 TABLET (5 MG TOTAL) BY MOUTH DAILY.  aspirin  EC 81 MG tablet Take 1 tablet (81 mg total) by mouth daily. Swallow whole.   atorvastatin  80 MG tablet Commonly known as: LIPITOR  TAKE 1 TABLET BY MOUTH DAILY   azithromycin  500 MG tablet Commonly known as: Zithromax  Take 1 tablet (500 mg total) by mouth daily for 1 day. Start taking on: May 07, 2024   diclofenac  Sodium 1 % Gel Commonly known as: VOLTAREN  Apply 2 g topically 4 (four) times daily. What changed:  when to take this reasons to take this   DULoxetine  60 MG capsule Commonly known as:  CYMBALTA  TAKE 1 CAPSULE BY MOUTH DAILY   empagliflozin  10 MG Tabs tablet Commonly known as: Jardiance  Take 1 tablet (10 mg total) by mouth daily before breakfast.   Entresto  49-51 MG Generic drug: sacubitril-valsartan Take 1 tablet by mouth 2 (two) times daily.   esomeprazole  40 MG capsule Commonly known as: NEXIUM  TAKE 1 CAPSULE DAILY AT 12 NOON   ezetimibe  10 MG tablet Commonly known as: ZETIA  TAKE 1 TABLET BY MOUTH EVERY DAY What changed: when to take this   Medical Compression Stockings Misc 1 each by Does not apply route once.   metoprolol  succinate 100 MG 24 hr tablet Commonly known as: TOPROL -XL TAKE 1 TABLET(100 MG) BY MOUTH EVERY MORNING WITH OR IMMEDIATELY FOLLOWING A MEAL   naloxone  4 MG/0.1ML Liqd nasal spray kit Commonly known as: NARCAN  Please administered to nostrils for decreased responsiveness or decreased breathing effort   nystatin  cream Commonly known as: MYCOSTATIN  APPLY TOPICALLY TWICE A DAY What changed:  how much to take when to take this reasons to take this   Ocuvite Adult Formula Caps Take 1 capsule by mouth daily. What changed: when to take this   oxyCODONE -acetaminophen  5-325 MG tablet Commonly known as: Percocet Take 1 tablet by mouth every 6 (six) hours as needed for severe pain (pain score 7-10). What changed: when to take this   predniSONE  10 MG tablet Commonly known as: DELTASONE  Take 4 tablets (40 mg total) by mouth daily for 3 days. Start taking on: May 07, 2024   selenium  sulfide 2.5 % lotion Commonly known as: SELSUN  APPLY 1 APPLICATION TOPICALLY DAILY AS NEEDED FOR IRRITATION   sodium chloride  0.65 % Soln nasal spray Commonly known as: OCEAN Place 1 spray into both nostrils as needed for congestion.   Trelegy Ellipta 100-62.5-25 MCG/ACT Aepb Generic drug: Fluticasone -Umeclidin-Vilant Inhale 1 puff into the lungs daily.               Durable Medical Equipment  (From admission, onward)           Start      Ordered   05/06/24 1224  For home use only DME oxygen  Once       Question Answer Comment  Length of Need 6 Months   Oxygen delivery system Gas      05/06/24 1223            Discharge Instructions: Please refer to Patient Instructions section of EMR for full details.  Patient was counseled important signs and symptoms that should prompt return to medical care, changes in medications, dietary instructions, activity restrictions, and follow up appointments.   Follow-Up Appointments:  Future Appointments  Date Time Provider Department Center  06/16/2024  3:00 PM Olinda Bertrand, DO [facility]  06/17/2024  7:25 AM [facility] DEVICE REMOTES [facility]  07/14/2024  1:00 PM Diamond Formica, MD [facility] None  09/16/2024  7:25 AM [facility] DEVICE  REMOTES [facility]  12/16/2024  7:25 AM [facility] DEVICE REMOTES [facility]  03/17/2025  7:25 AM [facility] DEVICE REMOTES [facility]     Verdell Given, MD 05/06/2024, 12:45 PM PGY-1, [facility]

## 2024-05-06 NOTE — Progress Notes (Signed)
 Patient met discharge criteria. O2 tanks delivered to bedside. No new DME ordered. HH PT and OT ordered. IV removed. AVS went over with patient. No further questions. Medications from [facility] pharmacy given to patient on way out. Discharged out of hospital via wheelchair with staff.

## 2024-05-06 NOTE — Progress Notes (Addendum)
   05/06/24 1116  TOC Brief Assessment  Insurance and Status Reviewed  Patient has primary care physician Yes  Home environment has been reviewed spouse  Prior level of function: uses cane, has walker, wheelchair, bedside commode and shower chair  Prior/Current Home Services No current home services  Social Drivers of Health Review SDOH reviewed no interventions necessary  Readmission risk has been reviewed Yes  Transition of care needs  (await PT /OT eval)   Spoke to patient at bedside. Patient from home with souse. Has DME see above.   Patient does not have home oxygen. Explained if needed it will be ordered and portable tank delivered to hospital room prior to discharge. Patient requesting ingen tank. Explained depends on insurance coverage. Agency will discuss with patient once receive order.     PT/OT to see patient. Patient has had HH in past in 2018 after hip surgery.   Await recommendations and orders   MD asking for home oxygen to be arranged    NCM has asked : need MD to co sign the ambulation note.. Also will need oxygen order with liter flow continuous and under comments ( POC eval) patient wants small tank . Once received will order home oxygen   Home oxygen delivered to hospital room   PT /OT recommending HHPT/OT. Discussed with patient. Patient has no preference . Loetta Ringer with [facility] accepted referral . Messaged teaching service team for orders and face to face

## 2024-05-06 NOTE — Progress Notes (Signed)
 Dropped to 86% after 68ft of ambulation- required 2L o2 [place] to get up to 88-89%

## 2024-05-06 NOTE — Plan of Care (Signed)
  Problem: Education: Goal: Knowledge of General Education information will improve Description: Including pain rating scale, medication(s)/side effects and non-pharmacologic comfort measures Outcome: Progressing   Problem: Health Behavior/Discharge Planning: Goal: Ability to manage health-related needs will improve Outcome: Progressing   Problem: Clinical Measurements: Goal: Will remain free from infection Outcome: Progressing Goal: Respiratory complications will improve Outcome: Progressing   Problem: Activity: Goal: Risk for activity intolerance will decrease Outcome: Progressing   Problem: Nutrition: Goal: Adequate nutrition will be maintained Outcome: Progressing   Problem: Coping: Goal: Level of anxiety will decrease Outcome: Progressing   

## 2024-05-06 NOTE — Assessment & Plan Note (Deleted)
 Possibly worsening in the last couple of weeks but timeline from pt is unclear. On chart review, it does seem that he may be at his baseline functional level. Possible COPD exacerbation, but no sputum changes per pt but will cover with abx. - PT/OT to treat - AM CBC, BMP - Fall precautions - DuoNebs q6h scheduled - Prednisone  40 mg x5 (5/28 - 6/1) - Azithro 500 mg x3d (5/28 - 5/30) - Daily weights - Strict I&Os

## 2024-05-06 NOTE — Evaluation (Signed)
 Occupational Therapy Evaluation Patient Details Name: George Leon MRN: [id number] DOB: Nov 10, 1956 Today's Date: 05/06/2024   History of Present Illness   Pt is a 68 y.o. male admitted 5/28 for hypoxia while at his PCP, requiring 4L of O2. PMH: COPD, CHF     Clinical Impressions Pt admitted based on above, and was seen based on problem list below. PTA pt was living with family and was independent with ADLs and IADLs. Today pt is requiring set up  to CGA for ADLs. Functional transfers are  CGA with use of SPC. During mobility on RA pt de-stating to 86. Educated pt on benefits of HH to regain activity tolerance, balance, and to assist with IADLs with new O2 line. Recommendation of HHOT to promote safety within the home and reduce fall risk. OT will continue to follow acutely to maximize functional independence.        If plan is discharge home, recommend the following:   A little help with walking and/or transfers;A little help with bathing/dressing/bathroom     Functional Status Assessment   Patient has had a recent decline in their functional status and demonstrates the ability to make significant improvements in function in a reasonable and predictable amount of time.     Equipment Recommendations   None recommended by OT     Recommendations for Other Services         Precautions/Restrictions   Precautions Precautions: Fall Recall of Precautions/Restrictions: Intact Restrictions Weight Bearing Restrictions Per Provider Order: No     Mobility Bed Mobility Overal bed mobility: Needs Assistance             General bed mobility comments: Received  and returned sitting EOB    Transfers Overall transfer level: Needs assistance Equipment used: Straight cane Transfers: Sit to/from Stand, Bed to chair/wheelchair/BSC Sit to Stand: Supervision     Step pivot transfers: Contact guard assist     General transfer comment: Pt using SPC for ambulation,  may benefit from use of RW CGA for balance hx of falls      Balance Overall balance assessment: Needs assistance Sitting-balance support: Bilateral upper extremity supported, Feet supported Sitting balance-Leahy Scale: Good     Standing balance support: Single extremity supported, During functional activity, Reliant on assistive device for balance Standing balance-Leahy Scale: Fair Standing balance comment: Can stand statically no UE support       ADL either performed or assessed with clinical judgement   ADL Overall ADL's : Needs assistance/impaired Eating/Feeding: Set up;Sitting   Grooming: Set up;Sitting           Upper Body Dressing : Set up;Sitting   Lower Body Dressing: Contact guard assist;Sit to/from stand Lower Body Dressing Details (indicate cue type and reason): Benefits from single UE support during functional tasks Toilet Transfer: Contact guard assist;Ambulation [facility]) Toilet Transfer Details (indicate cue type and reason): Simulated in room Toileting- Clothing Manipulation and Hygiene: Contact guard assist;Sit to/from stand Toileting - Clothing Manipulation Details (indicate cue type and reason): Simulated in room     Functional mobility during ADLs: Contact guard assist;Cane General ADL Comments: Decreased activity tolerance and balance limiting     Vision Baseline Vision/History: 0 No visual deficits Vision Assessment?: No apparent visual deficits            Pertinent Vitals/Pain Pain Assessment Pain Assessment: No/denies pain     Extremity/Trunk Assessment Upper Extremity Assessment Upper Extremity Assessment: Generalized weakness   Lower Extremity Assessment Lower Extremity Assessment: Defer to  PT evaluation   Cervical / Trunk Assessment Cervical / Trunk Assessment: Normal   Communication Communication Communication: No apparent difficulties   Cognition Arousal: Alert Behavior During Therapy: WFL for tasks  assessed/performed Cognition: No apparent impairments       Following commands: Intact       Cueing  General Comments   Cueing Techniques: Verbal cues  Pt received on RA stating at 90, with short ambulation on RA pt destats to           Home Living Family/patient expects to be discharged to:: Private residence Living Arrangements: Spouse/significant other;Children Available Help at Discharge: Family;Available 24 hours/day Type of Home: House Home Access: Stairs to enter [employer] of Steps: 4 Entrance Stairs-Rails: None Home Layout: One level;Laundry or work area in basement     [employer] Shower/Tub: Chief Strategy Officer: Standard Bathroom Accessibility: Yes How Accessible: Accessible via walker Home Equipment: Agricultural consultant (2 wheels);Cane - single point;Wheelchair - manual;BSC/3in1;Shower seat          Prior Functioning/Environment Prior Level of Function : Independent/Modified Independent;History of Falls (last six months)             Mobility Comments: Endorses approx 5 falls in last 6 months      OT Problem List: Decreased strength;Decreased activity tolerance;Impaired balance (sitting and/or standing)   OT Treatment/Interventions: Self-care/ADL training;Therapeutic exercise;Energy conservation;DME and/or AE instruction;Therapeutic activities;Patient/family education;Balance training      OT Goals(Current goals can be found in the care plan section)   Acute Rehab OT Goals Patient Stated Goal: To go home OT Goal Formulation: With patient Time For Goal Achievement: 05/20/24 Potential to Achieve Goals: Good   OT Frequency:  Min 2X/week       AM-PAC OT "6 Clicks" Daily Activity     Outcome Measure Help from another person eating meals?: None Help from another person taking care of personal grooming?: A Little Help from another person toileting, which includes using toliet, bedpan, or urinal?: A Little Help from  another person bathing (including washing, rinsing, drying)?: A Little Help from another person to put on and taking off regular upper body clothing?: A Little Help from another person to put on and taking off regular lower body clothing?: A Little 6 Click Score: 19   End of Session Equipment Utilized During Treatment: Gait belt;Other (comment) [facility]) Nurse Communication: Mobility status  Activity Tolerance: Patient tolerated treatment well Patient left: in bed;with call bell/phone within reach;with family/visitor present  OT Visit Diagnosis: Unsteadiness on feet (R26.81);Repeated falls (R29.6);Muscle weakness (generalized) (M62.81);History of falling (Z91.81)                Time: 1610-9604 OT Time Calculation (min): 14 min Charges:  OT General Charges $OT Visit: 1 Visit OT Evaluation $OT Eval Low Complexity: 1 Low  George Leon, OT  Acute Rehabilitation Services Office [phone] Secure chat preferred   George Leon 05/06/2024, 2:55 PM

## 2024-05-06 NOTE — Care Management CC44 (Signed)
 Condition Code 44 Documentation Completed  Patient Details  Name: George Leon MRN: [id number] Date of Birth: August 31, 1956   Condition Code 44 given:  Yes Patient signature on Condition Code 44 notice:  Yes Documentation of 2 MD's agreement:  Yes Code 44 added to claim:  Yes    Terre Ferri, RN 05/06/2024, 12:54 PM

## 2024-05-06 NOTE — Discharge Instructions (Addendum)
 Dear George Leon,  Thank you for letting us  participate in your care. You were hospitalized for acute hypoxemic respiratory failure and diagnosed with COPD exacerbation (HCC). You were treated with antibiotics, steroids, and bronchodilators.   POST-HOSPITAL & CARE INSTRUCTIONS Continue to take doses of antibiotic azithromycin  and steroid prednisone  until they are all gone.  Oxygen will be delivered to you per Heather's discussion with you.  Follow up with family medicine clinic.  Go to your follow up appointments (listed below)   DOCTOR'S APPOINTMENT   Future Appointments  Date Time Provider Department Center  06/16/2024  3:00 PM Olinda Bertrand, DO [facility]  06/17/2024  7:25 AM [facility] DEVICE REMOTES [facility]  07/14/2024  1:00 PM Diamond Formica, MD [facility] None  09/16/2024  7:25 AM [facility] DEVICE REMOTES [facility]  12/16/2024  7:25 AM [facility] DEVICE REMOTES [facility]  03/17/2025  7:25 AM [facility] HVT DEVICE REMOTES [facility]     Take care and be well!  Family Medicine Teaching Service Inpatient Team [place]  [facility]  [address] [place], [place] [address] [phone]

## 2024-05-06 NOTE — Assessment & Plan Note (Deleted)
 HLD: Continue home atorvastatin  80 mg daily Chronic pain: Continue home oxycodone  5 mg q6h PRN, Cymbalta  60 mg daily, Voltaren  gel PRN. GERD: Continue home Nexium  40 mg daily (formulary equivalent - pantoprazole  40 mg) CHF: Continue home Entresto  49-51 mg twice daily, Toprol -XL 100 mg daily, Jardiance  10 mg daily H/o MI: Continue home ASA 81 mg daily

## 2024-05-07 ENCOUNTER — Telehealth: Payer: Self-pay

## 2024-05-07 ENCOUNTER — Other Ambulatory Visit: Payer: Self-pay | Admitting: Student

## 2024-05-07 DIAGNOSIS — Z720 Tobacco use: Secondary | ICD-10-CM

## 2024-05-07 MED ORDER — NICOTINE 7 MG/24HR TD PT24: 7.0000 mg | MEDICATED_PATCH | Freq: Every day | TRANSDERMAL | 1 refills | Status: AC

## 2024-05-07 NOTE — Progress Notes (Signed)
 Placed order for nicotine  patch

## 2024-05-07 NOTE — Transitions of Care (Post Inpatient/ED Visit) (Signed)
 05/07/2024  Name: George Leon MRN: [id number] DOB: 30-Dec-1955  Today's TOC FU Call Status: Today's TOC FU Call Status:: Successful TOC FU Call Completed TOC FU Call Complete Date: 05/07/24 Patient's Name and Date of Birth confirmed.  Transition Care Management Follow-up Telephone Call Date of Discharge: 05/06/24 Discharge Facility: [facility]) Type of Discharge: Inpatient Admission Primary Inpatient Discharge Diagnosis:: COPD How have you been since you were released from the hospital?: Better Any questions or concerns?: No  Items Reviewed: Did you receive and understand the discharge instructions provided?: Yes Medications obtained,verified, and reconciled?: Yes (Medications Reviewed) Any new allergies since your discharge?: No Dietary orders reviewed?: Yes Do you have support at home?: Yes People in Home [RPT]: spouse  Medications Reviewed Today: Medications Reviewed Today     Reviewed by Darrall Ellison, LPN (Licensed Practical Nurse) on 05/07/24 at 1115  Med List Status: <None>   Medication Order Taking? Sig Documenting Provider Last Dose Status Informant  albuterol  (VENTOLIN  HFA) 108 (90 Base) MCG/ACT inhaler [id number] No USE 1 TO 2 INHALATIONS EVERY 4 HOURS AS NEEDED FOR SHORTNESS OF BREATH/WHEEZE/COUGH. Goble Last, MD 05/05/2024 Morning Active Self  amLODipine  (NORVASC ) 5 MG tablet [id number] No TAKE 1 TABLET (5 MG TOTAL) BY MOUTH DAILY. Goble Last, MD 05/05/2024 Morning Active Self  aspirin  EC 81 MG tablet [id number] No Take 1 tablet (81 mg total) by mouth daily. Swallow whole. Tolia, Sunit, DO 05/05/2024 Morning Active Self  atorvastatin  (LIPITOR ) 80 MG tablet [id number] No TAKE 1 TABLET BY MOUTH DAILY Goble Last, MD 05/04/2024 Evening Active Self  azithromycin  (ZITHROMAX ) 500 MG tablet [id number]  Take 1 tablet (500 mg total) by mouth daily for 1 day. Clem Currier, DO  Active   diclofenac  Sodium (VOLTAREN ) 1 % GEL [id number] No Apply 2 g topically 4 (four)  times daily.  Patient taking differently: Apply 2 g topically daily as needed (for pain).   Goble Last, MD Past Week Active Self  DULoxetine  (CYMBALTA ) 60 MG capsule [id number] No TAKE 1 CAPSULE BY MOUTH DAILY Goble Last, MD 05/04/2024 Evening Active Self  Elastic Bandages & Supports (MEDICAL COMPRESSION STOCKINGS) MISC [id number] No 1 each by Does not apply route once. Wynetta Heckle, MD Taking Active Self  empagliflozin  (JARDIANCE ) 10 MG TABS tablet [id number] No Take 1 tablet (10 mg total) by mouth daily before breakfast. Tolia, Sunit, DO 05/05/2024 Morning Active Self  esomeprazole  (NEXIUM ) 40 MG capsule [id number] No TAKE 1 CAPSULE DAILY AT 12 Ashby Lawman, MD 05/04/2024 Noon Active Self  ezetimibe  (ZETIA ) 10 MG tablet [id number] No TAKE 1 TABLET BY MOUTH EVERY DAY  Patient taking differently: Take 10 mg by mouth every evening.   Goble Last, MD 05/04/2024 Evening Active Self  Fluticasone -Umeclidin-Vilant (TRELEGY ELLIPTA) 100-62.5-25 MCG/ACT AEPB [id number] No Inhale 1 puff into the lungs daily. Ernestina Headland, MD 05/05/2024 Morning Active Self  metoprolol  succinate (TOPROL -XL) 100 MG 24 hr tablet [id number] No TAKE 1 TABLET(100 MG) BY MOUTH EVERY MORNING WITH OR IMMEDIATELY FOLLOWING A MEAL Goble Last, MD 05/05/2024 Morning Active Self  Multiple Vitamins-Minerals (OCUVITE ADULT FORMULA) CAPS [id number] No Take 1 capsule by mouth daily.  Patient taking differently: Take 1 capsule by mouth 2 (two) times daily.   Wynetta Heckle, MD 05/05/2024 Morning Active Self  naloxone  (NARCAN ) nasal spray 4 mg/0.1 mL [id number] No Please administered to nostrils for decreased responsiveness or decreased breathing effort Quinn Bucco, DO Unknown Active Self  nystatin  cream (MYCOSTATIN ) [id number] No APPLY TOPICALLY TWICE A DAY  Patient  taking differently: Apply 1 Application topically 2 (two) times daily as needed for dry skin.   Meccariello, Joann Mu, MD Past Week Active Self  oxyCODONE -acetaminophen   (PERCOCET) 5-325 MG tablet [id number] No Take 1 tablet by mouth every 6 (six) hours as needed for severe pain (pain score 7-10).  Patient taking differently: Take 1 tablet by mouth every 6 (six) hours.   Goble Last, MD 05/05/2024 Morning Active Self           Med Note (SATTERFIELD, DARIUS E   Wed May 05, 2024  3:43 PM) Patient verified he is taking every 6 hours because he is in so much pain to the point he can't sleep   predniSONE  (DELTASONE ) 10 MG tablet [id number]  Take 4 tablets (40 mg total) by mouth daily for 3 days. Clem Currier, DO  Active   sacubitril-valsartan (ENTRESTO ) 49-51 MG [id number] No Take 1 tablet by mouth 2 (two) times daily. Tolia, Sunit, DO 05/05/2024 Morning Active Self  selenium  sulfide (SELSUN ) 2.5 % shampoo [id number] No APPLY 1 APPLICATION TOPICALLY DAILY AS NEEDED FOR IRRITATION Goble Last, MD Past Week Active Self  sodium chloride  (OCEAN) 0.65 % SOLN nasal spray [id number] No Place 1 spray into both nostrils as needed for congestion. [provider] Past Week Active Self            Home Care and Equipment/Supplies: Were Home Health Services Ordered?: NA Any new equipment or medical supplies ordered?: Yes Name of Medical supply agency?: [employer] Were you able to get the equipment/medical supplies?: Yes Do you have any questions related to the use of the equipment/supplies?: No  Functional Questionnaire: Do you need assistance with bathing/showering or dressing?: No Do you need assistance with meal preparation?: No Do you need assistance with eating?: No Do you have difficulty maintaining continence: No Do you need assistance with getting out of bed/getting out of a chair/moving?: No Do you have difficulty managing or taking your medications?: No  Follow up appointments reviewed: PCP Follow-up appointment confirmed?: NA Specialist Hospital Follow-up appointment confirmed?: NA Do you need transportation to your follow-up appointment?: No Do you  understand care options if your condition(s) worsen?: Yes-patient verbalized understanding    SIGNATURE Darrall Ellison, LPN [facility] Nurse Health Advisor Direct Dial [phone]

## 2024-05-25 ENCOUNTER — Encounter: Payer: Self-pay | Admitting: Student

## 2024-05-25 DIAGNOSIS — G894 Chronic pain syndrome: Secondary | ICD-10-CM

## 2024-05-25 DIAGNOSIS — M5090 Cervical disc disorder, unspecified, unspecified cervical region: Secondary | ICD-10-CM

## 2024-05-26 ENCOUNTER — Other Ambulatory Visit: Payer: Self-pay | Admitting: Student

## 2024-05-26 MED ORDER — OXYCODONE-ACETAMINOPHEN 5-325 MG PO TABS: 1.0000 | ORAL_TABLET | Freq: Four times a day (QID) | ORAL | 0 refills | Status: DC | PRN

## 2024-05-27 ENCOUNTER — Telehealth: Payer: Self-pay

## 2024-05-27 NOTE — Telephone Encounter (Signed)
 Bynum Cassis- PT calls nurse line requesting orders to delay start of care until next week, 06/03/24.  Provided verbal orders for delaying start of care.   Elsie Halo, RN

## 2024-06-03 ENCOUNTER — Encounter: Payer: Self-pay | Admitting: Student

## 2024-06-03 NOTE — Telephone Encounter (Signed)
 Received returned call regarding delaying start of care until next week.   Provided with verbal orders.   George JAYSON English, RN

## 2024-06-05 ENCOUNTER — Other Ambulatory Visit: Payer: Self-pay | Admitting: Student

## 2024-06-05 DIAGNOSIS — I5032 Chronic diastolic (congestive) heart failure: Secondary | ICD-10-CM

## 2024-06-16 ENCOUNTER — Encounter: Payer: Self-pay | Admitting: Cardiology

## 2024-06-16 ENCOUNTER — Ambulatory Visit: Attending: Cardiology | Admitting: Cardiology

## 2024-06-16 ENCOUNTER — Other Ambulatory Visit (HOSPITAL_COMMUNITY): Payer: Self-pay

## 2024-06-16 VITALS — BP 104/70 | HR 83 | Resp 16 | Ht 69.0 in | Wt 259.8 lb

## 2024-06-16 DIAGNOSIS — I10 Essential (primary) hypertension: Secondary | ICD-10-CM | POA: Diagnosis present

## 2024-06-16 DIAGNOSIS — I441 Atrioventricular block, second degree: Secondary | ICD-10-CM | POA: Insufficient documentation

## 2024-06-16 DIAGNOSIS — I5032 Chronic diastolic (congestive) heart failure: Secondary | ICD-10-CM | POA: Insufficient documentation

## 2024-06-16 DIAGNOSIS — I739 Peripheral vascular disease, unspecified: Secondary | ICD-10-CM | POA: Insufficient documentation

## 2024-06-16 DIAGNOSIS — Z950 Presence of cardiac pacemaker: Secondary | ICD-10-CM | POA: Diagnosis present

## 2024-06-16 DIAGNOSIS — F17210 Nicotine dependence, cigarettes, uncomplicated: Secondary | ICD-10-CM | POA: Insufficient documentation

## 2024-06-16 MED ORDER — SPIRONOLACTONE 25 MG PO TABS
25.0000 mg | ORAL_TABLET | Freq: Every day | ORAL | 3 refills | Status: DC
  Filled 2024-06-16: qty 30, 30d supply, fill #0
  Filled 2024-07-13: qty 30, 30d supply, fill #1
  Filled 2024-08-11: qty 30, 30d supply, fill #2
  Filled 2024-09-09: qty 30, 30d supply, fill #3

## 2024-06-16 NOTE — Patient Instructions (Addendum)
 Medication Instructions:  DISCONTINUE amlodipine   START Spironolactone  25 mg once daily in the morning  *If you need a refill on your cardiac medications before your next appointment, please call your pharmacy*  Lab Work: BMP in 1 week.   If you have labs (blood work) drawn today and your tests are completely normal, you will receive your results only by: MyChart Message (if you have MyChart) OR A paper copy in the mail If you have any lab test that is abnormal or we need to change your treatment, we will call you to review the results.  Testing/Procedures: Your physician has requested that you have an echocardiogram. Echocardiography is a painless test that uses sound waves to create images of your heart. It provides your doctor with information about the size and shape of your heart and how well your heart's chambers and valves are working. This procedure takes approximately one hour. There are no restrictions for this procedure. Please do NOT wear cologne, perfume, aftershave, or lotions (deodorant is allowed). Please arrive 15 minutes prior to your appointment time.  Please note: We ask at that you not bring children with you during ultrasound (echo/ vascular) testing. Due to room size and safety concerns, children are not allowed in the ultrasound rooms during exams. Our front office staff cannot provide observation of children in our lobby area while testing is being conducted. An adult accompanying a patient to their appointment will only be allowed in the ultrasound room at the discretion of the ultrasound technician under special circumstances. We apologize for any inconvenience.  Follow-Up: At [facility], you and your health needs are our priority.  As part of our continuing mission to provide you with exceptional heart care, our providers are all part of one team.  This team includes your primary Cardiologist (physician) and Advanced Practice Providers or APPs (Physician  Assistants and Nurse Practitioners) who all work together to provide you with the care you need, when you need it.  Your next appointment:   1 year(s)  Provider:   Madonna Large, DO    We recommend signing up for the patient portal called MyChart.  Sign up information is provided on this After Visit Summary.  MyChart is used to connect with patients for Virtual Visits (Telemedicine).  Patients are able to view lab/test results, encounter notes, upcoming appointments, etc.  Non-urgent messages can be sent to your provider as well.   To learn more about what you can do with MyChart, go to [email or web address].

## 2024-06-16 NOTE — Progress Notes (Signed)
 Cardiology Office Note:  .   Date:  06/16/2024  ID:  George Leon, DOB [birth year], MRN [id number] PCP:  Manon Jester, DO  Former Cardiology Providers: None [facility] Providers Cardiologist:  Madonna Large, DO, [facility] (established care 07/02/2021)  Electrophysiologist:  Danelle Birmingham, MD  Electrophysiologist:  Danelle Birmingham, MD  Click to update primary MD,subspecialty MD or APP then REFRESH:1}    Chief Complaint  Patient presents with   Chronic heart failure with preserved ejection fraction   Follow-up    History of Present Illness: George   LUKA Leon is a 68 y.o. Caucasian male whose past medical history and cardiovascular risk factors includes:  Status post dual-chamber pacemaker for symptomatic bradycardia and complete heart block, HFpEF, hypertension, hyperlipidemia, chronic stage IIIa chronic kidney disease,  obesity due to excess calories, prior tobacco use, COPD.   Patient being followed in the practice given his history of chronic HFpEF and history of symptomatic second-degree type II AV block status post pacemaker implant.  During initial office visit in July 2022 he was noted to be in second-degree type II AV block /as well as complete heart block and was quite symptomatic.  He underwent pacemaker implantation with Dr. Birmingham.  Subsequently had a left heart catheterization which noted no significant epicardial coronary disease.  And has medications have been uptitrated given his underlying HFpEF.   He presents today for 1 year follow-up visit.  He denies anginal chest pain or heart failure symptoms.  He was recently hospitalized in May 2025 for COPD exacerbation and was discharged home on oxygen.  Unfortunately, he continues to smoke less than 1 pack/day.  Patient states that he is motivated to quit but has been under a lot of stress which has made it difficult.  He denies claudication despite having an abnormal ABI in the past.  Patient did have his pacemaker  checked in April 2025, results reviewed.   Review of Systems: .   Review of Systems  Constitutional: Positive for weight gain.  Cardiovascular:  Positive for dyspnea on exertion (chronic and stable) and leg swelling. Negative for chest pain, claudication, irregular heartbeat, near-syncope, orthopnea, palpitations, paroxysmal nocturnal dyspnea and syncope.  Respiratory:  Positive for shortness of breath (chronic and stable).   Hematologic/Lymphatic: Negative for bleeding problem.    Studies Reviewed:   Echocardiogram: 07/03/2021: LVEF 60-65%, no regional wall motion abnormalities, mild LVH, grade 1 diastolic impairment, normal right ventricular size and function, mild MR, right atrial pressure 15 mmHg.   Heart catheterization: Left Heart Catheterization 07/03/21: No significant CAD.  Moderately elevated EDP. LV: 125 mmHg / 12 mmHg, EDP 22 mmHg.  Aortic pressure 109/46 with a mean of 69 mmHg.  There was no pressure gradient across the aortic valve.  LVEDP moderately elevated.  LVEF 65%, no significant MR. LM: Large vessel, normal. LAD: Very large vessel, gives origin to 3 large diagonals, very mild disease is evident with mild calcification in the proximal segment. CX: Moderate sized vessel, smooth and normal. RCA: Very large vessel, PL branch is very large.  There is mild disease in the distal RCA.  Mild calcification is evident in the proximal segment. Recommendation: No reversible cause for heart block.  He will be scheduled for pacemaker implantation tomorrow.   Lower Extremity Arterial Duplex 08/10/2021:  Proximal to mid right SFA >50% stenosis with monophasic and dampened  waveform pattern. There is diffuse disease and monophasic waveform pattern in the right below knee small arteries.  No hemodynamically significant stenosis  identified in the left lower extremity. There is diffuse plaque and severely abnormal monophasic waveform  in the small vessels below knee.  This exam reveals  mildly decreased perfusion of the right lower extremity, noted at the anterior tibial artery level (ABI  0.90) and moderately decreased perfusion of the left lower extremity at the post tibial artery level (ABI 0.71).    Abdominal Aortic Duplex 08/10/2021:  The maximum aorta (sac) diameter is 2.06 cm (prox). No AAA observed. No evidence of significant  atherosclerotic plaque. Normal flow velocities noted in bilateral common iliac arteries.    Pacemaker Biotronik Edora dual chamber pacemaker implantation 07/04/2021   In office device interrogation: April 1st 2025 Normal in-clinic dual chamber ICD check. Presenting Rhythm: AS-VP . Routine testing was performed. Thresholds, sensing, and impedance demonstrate stable parameters and no programming changes needed. No treated arrhythmias, rare NSVT. Estimated longevity  53yr52mo. Pt enrolled in remote follow-up.   RADIOLOGY: NA  Risk Assessment/Calculations:   NA   Labs:       Latest Ref Rng & Units 05/06/2024    6:59 AM 05/05/2024    6:05 PM 04/29/2024    4:15 PM  CBC  WBC 4.0 - 10.5 K/uL 16.8  15.6  13.8   Hemoglobin 13.0 - 17.0 g/dL 83.1  83.3  83.2   Hematocrit 39.0 - 52.0 % 51.2  51.6  49.2   Platelets 150 - 400 K/uL 229  209  208        Latest Ref Rng & Units 05/06/2024    9:03 AM 05/05/2024    6:05 PM 04/29/2024    4:15 PM  BMP  Glucose 70 - 99 mg/dL 892  880  82   BUN 8 - 23 mg/dL 14  8  12    Creatinine 0.61 - 1.24 mg/dL 9.28  9.38  9.43   BUN/Creat Ratio 10 - 24   21   Sodium 135 - 145 mmol/L 135  134  139   Potassium 3.5 - 5.1 mmol/L 4.9  4.4  4.7   Chloride 98 - 111 mmol/L 97  98  100   CO2 22 - 32 mmol/L 29  25  25    Calcium  8.9 - 10.3 mg/dL 9.7  9.0  9.2       Latest Ref Rng & Units 05/06/2024    9:03 AM 05/05/2024    6:05 PM 04/29/2024    4:15 PM  CMP  Glucose 70 - 99 mg/dL 892  880  82   BUN 8 - 23 mg/dL 14  8  12    Creatinine 0.61 - 1.24 mg/dL 9.28  9.38  9.43   Sodium 135 - 145 mmol/L 135  134  139   Potassium  3.5 - 5.1 mmol/L 4.9  4.4  4.7   Chloride 98 - 111 mmol/L 97  98  100   CO2 22 - 32 mmol/L 29  25  25    Calcium  8.9 - 10.3 mg/dL 9.7  9.0  9.2   Total Protein 6.5 - 8.1 g/dL  6.4  6.3   Total Bilirubin 0.0 - 1.2 mg/dL  0.8  0.3   Alkaline Phos 38 - 126 U/L  80  114   AST 15 - 41 U/L  20  19   ALT 0 - 44 U/L  17  14     Lab Results  Component Value Date   CHOL 146 04/02/2021   HDL 35 (L) 04/02/2021   LDLCALC 86 04/02/2021   LDLDIRECT  85 07/22/2012   TRIG 140 04/02/2021   CHOLHDL 4.2 04/02/2021   No results for input(s): LIPOA in the last 8760 hours. No components found for: NTPROBNP No results for input(s): PROBNP in the last 8760 hours. Recent Labs    04/29/24 1615  TSH 1.260    Physical Exam:    Today's Vitals   06/16/24 1517  BP: 104/70  Pulse: 83  Resp: 16  SpO2: (!) 89%  Weight: 259 lb 12.8 oz (117.8 kg)  Height: 5' 9 (1.753 m)   Body mass index is 38.37 kg/m. Wt Readings from Last 3 Encounters:  06/16/24 259 lb 12.8 oz (117.8 kg)  05/06/24 259 lb 14.8 oz (117.9 kg)  05/05/24 258 lb 9.6 oz (117.3 kg)    Physical Exam  Constitutional: No distress. He appears chronically ill.  hemodynamically stable, walks w/ cane  Neck: No JVD present.  Cardiovascular: Normal rate, regular rhythm, S1 normal and S2 normal. Exam reveals no gallop, no S3 and no S4.  No murmur heard. Pulses:      Dorsalis pedis pulses are 1+ on the right side and 1+ on the left side.       Posterior tibial pulses are 0 on the right side and 0 on the left side.  Pulmonary/Chest: No stridor. He has no wheezes. He has no rales.  Pacemaker noted. Decreased breath sounds bilateral  Musculoskeletal:        General: Edema (trace bilateral) present.     Cervical back: Neck supple.  Skin: Skin is warm.     Impression & Recommendation(s):  Impression:   ICD-10-CM   1. Chronic heart failure with preserved ejection fraction (HCC)  I50.32 spironolactone  (ALDACTONE ) 25 MG tablet    Basic  Metabolic Panel (BMET)    ECHOCARDIOGRAM COMPLETE    Basic Metabolic Panel (BMET)    2. Second degree AV block, Mobitz type II  I44.1     3. Cardiac pacemaker in situ  Z95.0     4. Benign hypertension  I10     5. PAD (peripheral artery disease) (HCC)  I73.9     6. Continuous dependence on cigarette smoking  F17.210        Recommendation(s):  Chronic heart failure with preserved ejection fraction (HCC) Stage B, NYHA class II. Continue Entresto  49/51 mg p.o. twice daily. Continue Toprol -XL 100 mg p.o. every morning. Continue Jardiance  10 mg p.o. daily. Discontinue amlodipine  5 mg p.o. daily. Will start spironolactone  25 mg p.o. daily. BMP in one week to check renal function and electrolytes Echo will be ordered to evaluate for structural heart disease and left ventricular systolic function.  Second degree AV block, Mobitz type II Cardiac pacemaker in situ Underwent pacemaker implant in July 2022. Recently had his office 1 year check on March 09, 2024, results reviewed  Benign hypertension Office blood pressures are soft but patient is asymptomatic. Medications as discussed above. Reemphasized importance of low-salt diet.  PAD (peripheral artery disease) (HCC) Lower extremity arterial duplex in the past as illustrated PAD. Denies claudication. Reemphasize importance of improving modifiable cardiovascular risk factors-especially complete smoking cessation. Monitor for now  Cigarette smoking: Tobacco cessation counseling: Currently smoking <1 packs/day   Patient denies claudication Patient is informed to follow-up with PCP and consider lung cancer screening if and when appropriate. He is informed of the dangers of tobacco abuse including stroke, cancer, and MI, as well as benefits of tobacco cessation. He is willing to quit at this time. 5 mins were spent counseling  patient cessation techniques. We discussed various methods to help quit smoking, including deciding on a  date to quit, joining a support group, pharmacological agents- nicotine  gum/patch/lozenges.  Tries to utilizes nicotine  patches. I have educated him that smoking in the presence of oxygen predisposes himself and others to fire hazard.  Patient verbalizes understanding. I will reassess his progress at the next follow-up visit   Orders Placed:  Orders Placed This Encounter  Procedures   Basic Metabolic Panel (BMET)    Standing Status:   Future    Number of Occurrences:   1    Expected Date:   06/23/2024    Expiration Date:   06/16/2025   ECHOCARDIOGRAM COMPLETE    Standing Status:   Future    Expected Date:   06/23/2024    Expiration Date:   06/16/2025    Where [facility] performed[facility]    Does the patient weigh less than or greater than 250 lbs?:   Patient weighs greater than 250 lbs    Perflutren DEFINITY (image enhancing agent) should be administered unless hypersensitivity or allergy exist:   Administer Perflutren    Reason for exam-Echo:   Other-Full Diagnosis List    Full ICD-10/Reason for Exam:   Heart failure with preserved ejection fraction (HCC) [[id number]]     Final Medication List:    Meds ordered this encounter  Medications   spironolactone  (ALDACTONE ) 25 MG tablet    Sig: Take 1 tablet (25 mg total) by mouth daily.    Dispense:  30 tablet    Refill:  3    Medications Discontinued During This Encounter  Medication Reason   amLODipine  (NORVASC ) 5 MG tablet Discontinued by provider     Current Outpatient Medications:    albuterol  (VENTOLIN  HFA) 108 (90 Base) MCG/ACT inhaler, USE 1 TO 2 INHALATIONS EVERY 4 HOURS AS NEEDED FOR SHORTNESS OF BREATH/WHEEZE/COUGH., Disp: 17 g, Rfl: 4   aspirin  EC 81 MG tablet, Take 1 tablet (81 mg total) by mouth daily. Swallow whole., Disp: 30 tablet, Rfl: 12   atorvastatin  (LIPITOR ) 80 MG tablet, TAKE 1 TABLET BY MOUTH DAILY, Disp: 90 tablet, Rfl: 3   diclofenac  Sodium (VOLTAREN ) 1 % GEL, Apply 2 g topically 4  (four) times daily. (Patient taking differently: Apply 2 g topically daily as needed (for pain).), Disp: 2 g, Rfl: 11   DULoxetine  (CYMBALTA ) 60 MG capsule, TAKE 1 CAPSULE BY MOUTH DAILY, Disp: 90 capsule, Rfl: 3   Elastic Bandages & Supports (MEDICAL COMPRESSION STOCKINGS) MISC, 1 each by Does not apply route once., Disp: 2 each, Rfl: 0   empagliflozin  (JARDIANCE ) 10 MG TABS tablet, Take 1 tablet (10 mg total) by mouth daily before breakfast., Disp: 90 tablet, Rfl: 3   esomeprazole  (NEXIUM ) 40 MG capsule, TAKE 1 CAPSULE DAILY AT 12 NOON, Disp: 90 capsule, Rfl: 3   ezetimibe  (ZETIA ) 10 MG tablet, TAKE 1 TABLET BY MOUTH EVERY DAY (Patient taking differently: Take 10 mg by mouth every evening.), Disp: 90 tablet, Rfl: 2   Fluticasone -Umeclidin-Vilant (TRELEGY ELLIPTA ) 100-62.5-25 MCG/ACT AEPB, Inhale 1 puff into the lungs daily., Disp: 1 each, Rfl: 2   metoprolol  succinate (TOPROL -XL) 100 MG 24 hr tablet, TAKE 1 TABLET(100 MG) BY MOUTH EVERY MORNING WITH OR IMMEDIATELY FOLLOWING A MEAL, Disp: 90 tablet, Rfl: 0   Multiple Vitamins-Minerals (OCUVITE ADULT FORMULA) CAPS, Take 1 capsule by mouth daily. (Patient taking differently: Take 1 capsule by mouth 2 (two) times daily.), Disp: 90  capsule, Rfl: 3   naloxone  (NARCAN ) nasal spray 4 mg/0.1 mL, Please administered to nostrils for decreased responsiveness or decreased breathing effort, Disp: 1 each, Rfl: 3   nicotine  (NICODERM CQ  - DOSED IN MG/24 HR) 7 mg/24hr patch, Place 1 patch (7 mg total) onto the skin daily., Disp: 28 patch, Rfl: 1   nystatin  cream (MYCOSTATIN ), APPLY TOPICALLY TWICE A DAY (Patient taking differently: Apply 1 Application topically 2 (two) times daily as needed for dry skin.), Disp: 30 g, Rfl: 23   oxyCODONE -acetaminophen  (PERCOCET) 5-325 MG tablet, Take 1 tablet by mouth every 6 (six) hours as needed for severe pain (pain score 7-10)., Disp: 120 tablet, Rfl: 0   sacubitril -valsartan  (ENTRESTO ) 49-51 MG, Take 1 tablet by mouth 2 (two)  times daily., Disp: 180 tablet, Rfl: 3   selenium  sulfide (SELSUN ) 2.5 % shampoo, APPLY 1 APPLICATION TOPICALLY DAILY AS NEEDED FOR IRRITATION, Disp: 120 mL, Rfl: 3   sodium chloride  (OCEAN) 0.65 % SOLN nasal spray, Place 1 spray into both nostrils as needed for congestion., Disp: , Rfl:    spironolactone  (ALDACTONE ) 25 MG tablet, Take 1 tablet (25 mg total) by mouth daily., Disp: 30 tablet, Rfl: 3  Consent:   NA  Disposition:   1 year follow-up sooner if needed  His questions and concerns were addressed to his satisfaction. He voices understanding of the recommendations provided during this encounter.    Signed, Madonna Michele HAS, [facility]  A Division of [facility] [address], [place], [place] [address]  [place], [place] [id number] 06/16/2024 6:46 PM

## 2024-06-17 ENCOUNTER — Encounter: Payer: Self-pay | Admitting: Family Medicine

## 2024-06-17 ENCOUNTER — Other Ambulatory Visit: Payer: Self-pay | Admitting: Student

## 2024-06-17 ENCOUNTER — Ambulatory Visit: Payer: Medicare Other

## 2024-06-17 DIAGNOSIS — I442 Atrioventricular block, complete: Secondary | ICD-10-CM

## 2024-06-17 NOTE — Telephone Encounter (Signed)
 Please call pharmacy to discontinue. Thank you!

## 2024-06-18 ENCOUNTER — Encounter: Payer: Self-pay | Admitting: Student

## 2024-06-18 LAB — CUP PACEART REMOTE DEVICE CHECK
Battery Voltage: 70
Date Time Interrogation Session: 20250710103729
Implantable Lead Connection Status: 753985
Implantable Lead Connection Status: 753985
Implantable Lead Implant Date: 20220727
Implantable Lead Implant Date: 20220727
Implantable Lead Location: 753859
Implantable Lead Location: 753860
Implantable Lead Model: 377171
Implantable Lead Model: 377171
Implantable Lead Serial Number: 8000456432
Implantable Lead Serial Number: 8000477614
Implantable Pulse Generator Implant Date: 20220727
Pulse Gen Model: 407145
Pulse Gen Serial Number: 70164524

## 2024-06-21 ENCOUNTER — Ambulatory Visit: Payer: Self-pay | Admitting: Internal Medicine

## 2024-06-21 ENCOUNTER — Encounter: Payer: Self-pay | Admitting: Student

## 2024-06-21 DIAGNOSIS — G894 Chronic pain syndrome: Secondary | ICD-10-CM

## 2024-06-21 DIAGNOSIS — M5090 Cervical disc disorder, unspecified, unspecified cervical region: Secondary | ICD-10-CM

## 2024-06-22 MED ORDER — OXYCODONE-ACETAMINOPHEN 5-325 MG PO TABS: 1.0000 | ORAL_TABLET | Freq: Four times a day (QID) | ORAL | 0 refills | Status: DC | PRN

## 2024-06-25 ENCOUNTER — Encounter: Payer: Self-pay | Admitting: Student

## 2024-06-25 DIAGNOSIS — M5090 Cervical disc disorder, unspecified, unspecified cervical region: Secondary | ICD-10-CM

## 2024-06-25 DIAGNOSIS — G894 Chronic pain syndrome: Secondary | ICD-10-CM

## 2024-06-25 MED ORDER — OXYCODONE-ACETAMINOPHEN 5-325 MG PO TABS: 1.0000 | ORAL_TABLET | Freq: Four times a day (QID) | ORAL | 0 refills | Status: DC | PRN

## 2024-06-28 ENCOUNTER — Encounter: Payer: Self-pay | Admitting: Cardiology

## 2024-06-28 NOTE — Telephone Encounter (Signed)
 Transitioned him from norvasc  to aldactone  (ideally should not cause swelling in different places).  Add on bnp to the prior labs.  Have him follow up w/ PCP or APP if possible.   Shanti Eichel Iva, DO, FACC

## 2024-06-30 ENCOUNTER — Other Ambulatory Visit: Payer: Self-pay

## 2024-06-30 DIAGNOSIS — R609 Edema, unspecified: Secondary | ICD-10-CM

## 2024-07-02 ENCOUNTER — Other Ambulatory Visit: Payer: Self-pay

## 2024-07-03 MED ORDER — TRELEGY ELLIPTA 100-62.5-25 MCG/ACT IN AEPB: 1.0000 | INHALATION_SPRAY | Freq: Every day | RESPIRATORY_TRACT | 2 refills | Status: DC

## 2024-07-07 ENCOUNTER — Ambulatory Visit: Payer: Self-pay | Admitting: Cardiology

## 2024-07-07 LAB — BASIC METABOLIC PANEL WITH GFR
BUN/Creatinine Ratio: 15 (ref 10–24)
BUN: 10 mg/dL (ref 8–27)
CO2: 22 mmol/L (ref 20–29)
Calcium: 9.1 mg/dL (ref 8.6–10.2)
Chloride: 100 mmol/L (ref 96–106)
Creatinine, Ser: 0.67 mg/dL — ABNORMAL LOW (ref 0.76–1.27)
Glucose: 92 mg/dL (ref 70–99)
Potassium: 4.4 mmol/L (ref 3.5–5.2)
Sodium: 138 mmol/L (ref 134–144)
eGFR: 103 mL/min/1.73 (ref >59)

## 2024-07-07 LAB — PRO B NATRIURETIC PEPTIDE: NT-Pro BNP: 48 pg/mL (ref 0–376)

## 2024-07-11 NOTE — Progress Notes (Unsigned)
 George Leon, male    DOB: [birth year]   MRN: [id number]   Brief patient profile:  68 yowm   sometime still smoker  remote [place] pt  referred to pulmonary clinic 07/14/2024 by [facility]  for new 02 dep resp failure in setting of longtime copd   p admit 05/05/24      PFTs 06/19/15 GOLD 3 COPD  FVC 1.70 [37%] FEV1 1.05 [31%) Ratio 0.64  Date of Admission: 05/05/2024                 Date of Discharge: 05/06/2024  Indication for Hospitalization: COPD exacerbation, acute hypoxemic respiratory failure   Discharge Diagnoses/Problem List:  Principal Problem for Admission: COPD exacerbation, acute hypoxemic respiratory failu:  COPD exacerbation   Acute hypoxemic respiratory failure (HCC)   Chronic health problem   Dyspnea       Brief Hospital Course:  FB is a 68yo M w/ hx of COPD, pulm HTN, HTN, HLD, complete heart block with pacemaker, CHF, PVD that was admitted for acute hypoxic respiratory failure.   AHRF  COPD Exacerbation  Pt presented to PCP with worsening wheezing and new oxygen requirement. Pt was direct admitted to [facility] service. Pt was satting well on RA, but desatted w/ exertion. CXR showed pulmonary edema versus viral infection. Pt was diagnosed w/ COPD exacerbation and treated steroids (prednisone  x5days, last dose 6/1). Pt was also treated w/ duonebs and home Trelegy (formulary alternative). At time of discharge, pt was still requiring 2L to maintain 92%, so was sent home w/ home oxygen.      PCP Recommendations 1) Monitor for improvement of acute COPD exac. Pt may need chronic O2 at baseline.    Disposition: Home with oxygen   General: Calm, cooperative, not in acute distress Pulmonary: Normal effort, clear to auscultation including no wheezing Cardio: Normal rate and rhythm.  Normal heart sounds. Abdominal: Soft and nontender Extremities: Trace lower extremity and bilaterally (chronic), warm and dry, no wounds          History of Present Illness  07/14/2024   Pulmonary/ 1st office eval/Renzo Vincelette maint on Trelegy since admit / 02 / still smoking and requesting POC  Chief Complaint  Patient presents with   Consult    Referred while pt was in the hospital for SOB. Pt was ordered o2 in the hospital.   Dyspnea:  can do 3 aisles at grocery store HT or [place] and usually does HC Parking / bad back/ balance uses cane/bad knees also  Cough: slt rattle / min mucoid  Sleep: in recline 30 degrees and 2lpm  SABA use: once daily  02 use:on return to house - has portable not using     No obvious day to day or daytime pattern/variability or assoc excess/ purulent sputum or mucus plugs or hemoptysis or cp or chest tightness, subjective wheeze or overt  hb symptoms.    Also denies any obvious fluctuation of symptoms with weather or environmental changes or other aggravating or alleviating factors except as outlined above   No unusual exposure hx or h/o childhood pna/ asthma or knowledge of premature birth.  Current Allergies, Complete Past Medical History, Past Surgical History, Family History, and Social History were reviewed in [employer] record.  ROS  The following are not active complaints unless bolded Hoarseness, sore throat, dysphagia, dental problems, itching, sneezing,  nasal congestion or discharge of excess mucus or purulent secretions, ear ache,   fever, chills, sweats, unintended wt loss  or wt gain, classically pleuritic or exertional cp,  orthopnea pnd or arm/hand swelling  or leg swelling -better since wearing elastic hose , presyncope, palpitations, abdominal pain, anorexia, nausea, vomiting, diarrhea  or change in bowel habits or change in bladder habits, change in stools or change in urine, dysuria, hematuria,  rash, arthralgias, visual complaints, headache, numbness, weakness or ataxia or problems with walking or coordination,  change in mood or  memory.             Outpatient Medications Prior to Visit  Medication Sig Dispense  Refill   albuterol  (VENTOLIN  HFA) 108 (90 Base) MCG/ACT inhaler USE 1 TO 2 INHALATIONS EVERY 4 HOURS AS NEEDED FOR SHORTNESS OF BREATH/WHEEZE/COUGH. 17 g 4   aspirin  EC 81 MG tablet Take 1 tablet (81 mg total) by mouth daily. Swallow whole. 30 tablet 12   atorvastatin  (LIPITOR ) 80 MG tablet TAKE 1 TABLET BY MOUTH DAILY 90 tablet 3   diclofenac  Sodium (VOLTAREN ) 1 % GEL Apply 2 g topically 4 (four) times daily. (Patient taking differently: Apply 2 g topically daily as needed (for pain).) 2 g 11   DULoxetine  (CYMBALTA ) 60 MG capsule TAKE 1 CAPSULE BY MOUTH DAILY 90 capsule 3   Elastic Bandages & Supports (MEDICAL COMPRESSION STOCKINGS) MISC 1 each by Does not apply route once. 2 each 0   empagliflozin  (JARDIANCE ) 10 MG TABS tablet Take 1 tablet (10 mg total) by mouth daily before breakfast. 90 tablet 3   esomeprazole  (NEXIUM ) 40 MG capsule TAKE 1 CAPSULE DAILY AT 12 NOON 90 capsule 3   ezetimibe  (ZETIA ) 10 MG tablet TAKE 1 TABLET BY MOUTH EVERY DAY (Patient taking differently: Take 10 mg by mouth every evening.) 90 tablet 2   Fluticasone -Umeclidin-Vilant (TRELEGY ELLIPTA ) 100-62.5-25 MCG/ACT AEPB Inhale 1 puff into the lungs daily. 1 each 2   metoprolol  succinate (TOPROL -XL) 100 MG 24 hr tablet TAKE 1 TABLET(100 MG) BY MOUTH EVERY MORNING WITH OR IMMEDIATELY FOLLOWING A MEAL 90 tablet 0   Multiple Vitamins-Minerals (OCUVITE ADULT FORMULA) CAPS Take 1 capsule by mouth daily. (Patient taking differently: Take 1 capsule by mouth 2 (two) times daily.) 90 capsule 3   naloxone  (NARCAN ) nasal spray 4 mg/0.1 mL Please administered to nostrils for decreased responsiveness or decreased breathing effort 1 each 3   nystatin  cream (MYCOSTATIN ) APPLY TOPICALLY TWICE A DAY (Patient taking differently: Apply 1 Application topically 2 (two) times daily as needed for dry skin.) 30 g 23   oxyCODONE -acetaminophen  (PERCOCET) 5-325 MG tablet Take 1 tablet by mouth every 6 (six) hours as needed for severe pain (pain score  7-10). 120 tablet 0   sacubitril -valsartan  (ENTRESTO ) 49-51 MG Take 1 tablet by mouth 2 (two) times daily. 180 tablet 3   selenium  sulfide (SELSUN ) 2.5 % shampoo APPLY 1 APPLICATION TOPICALLY DAILY AS NEEDED FOR IRRITATION 120 mL 3   sodium chloride  (OCEAN) 0.65 % SOLN nasal spray Place 1 spray into both nostrils as needed for congestion.     spironolactone  (ALDACTONE ) 25 MG tablet Take 1 tablet (25 mg total) by mouth daily. 30 tablet 3   No facility-administered medications prior to visit.    Past Medical History:  Diagnosis Date   Acute MI (HCC)    Acute on chronic diastolic heart failure (HCC) 12/01/2015   Anxiety    Arthritis    qwhere (08/06/2017)   CHF (congestive heart failure) (HCC) dx'd 2016   Chronic back pain    all my back (08/06/2017)   Chronic bronchitis (HCC)  Chronic pain    Chronic pain syndrome 12/01/2015   Complete heart block (HCC)    COPD (chronic obstructive pulmonary disease) (HCC) 03/03/2024   O2 sat 89% to 91% on RA.  States he probably needs O2 all the time.   Dyspnea on exertion 03/03/2024   O2 sat 89% to 91% on RA. States he probably needs O2 all the time.   Emphysema of lung (HCC)    Encounter for care of pacemaker 07/04/2021   High cholesterol    High degree atrioventricular block    History of hiatal hernia    possible     Hypertension    Liver fatty degeneration    Macular degeneration, right eye    Pacemaker Biotronik [place] dual chamber transmission  07/04/2021   Pacemaker Dual chamber [employer]otronik [place] 8dr-T Mri - [id number] pacemaker 07/04/2021 07/04/2021   Pneumonia  1990s X 1; 2016      Objective:     BP 139/80   Pulse 77   Temp 97.8 F (36.6 C)   Ht 5' 9 (1.753 m)   Wt 256 lb 12.8 oz (116.5 kg)   SpO2 (!) 89% Comment: RA  BMI 37.92 kg/m   SpO2: (!) 89 % (RA)  Mod obese (by BMI) amb (with cane) wm nad   HEENT :  Oropharynx  clear   Nasal turbinates nl    NECK :  without JVD/Nodes/TM/ nl carotid upstrokes  bilaterally   LUNGS: no acc muscle use,  Mod barrel  contour chest wall with bilateral  Distant bs s audible wheeze and  without cough on insp or exp maneuvers and mod  Hyperresonant  to  percussion bilaterally     CV:  RRR  no s3 or murmur or increase in P2, and no edema   ABD:  soft and nontender with pos mid insp Hoover's  in the supine position. No bruits or organomegaly appreciated, bowel sounds nl  MS:   Ext warm without deformities or   obvious joint restrictions , calf tenderness, cyanosis or clubbing  SKIN: warm and dry without lesions    NEURO:  alert, approp, nl sensorium with  no motor or cerebellar deficits apparent.          CXR PA and Lateral:   07/14/2024 :    I personally reviewed images and impression is as follows:     Mild t kyphosis/ mod copd no acute findings      Assessment     Assessment & Plan COPD GOLD 3/ 02 dep  Active smoker started on 2lpm @ admit 05/05/24  - PFTs 06/19/15  FVC 1.70 [37%],  FEV1 1.05 [31%)  Ratio 0.64 - 07/14/2024 trelegy 100 every day and return for pfts in 3 m  Clearly  Group D (now reclassified as E) in terms of symptom/risk and laba/lama/ICS  therefore appropriate rx at this point >>>  continue trelegy and approp saba:  Re SABA :  I spent extra time with pt today reviewing appropriate use of albuterol  for prn use on exertion with the following points: 1) saba is for relief of sob that does not improve by walking a slower pace or resting but rather if the pt does not improve after trying this first. 2) If the pt is convinced, as many are, that saba helps recover from activity faster then it's easy to tell if this is the case by re-challenging : ie stop, take the inhaler, then p 5 minutes try the exact same activity (intensity  of workload) that just caused the symptoms and see if they are substantially diminished or not after saba 3) if there is an activity that reproducibly causes the symptoms, try the saba 15 min before the activity on  alternate days   If in fact the saba really does help, then fine to continue to use it prn but advised may need to look closer at the maintenance regimen (trelegy for now)  being used to achieve better control of airways disease with exertion.   >>> return for pfts and alpha one AT phenotype next ov @ 3 m, call sooner prn       Chronic respiratory failure with hypoxia (HCC) Placed on 02 at admit 04/2024  - 07/14/2024   Walked on RA x 50 ft dropped to 86%   then completed 200 ft on POC upt o 6lpm with sats 88%  slow pace with cane   Rec: 2lpm at hs and at home but with > 50 ft walking:   Make sure you check your oxygen saturation  AT  your highest level of activity (not after you stop)   to be sure it stays over 90% and adjust  02 flow upward to maintain this level if needed but remember to turn it back to previous settings when you stop (to conserve your supply).    Cigarette smoker Counseled re importance of smoking cessation but did not meet time criteria for separate billing    Each maintenance medication was reviewed in detail including emphasizing most importantly the difference between maintenance and prns and under what circumstances the prns are to be triggered using an action plan format where appropriate.  Total time for H and P, chart review, counseling, reviewing hfa/dpi/ neb/02 /pulse ox  device(s) , directly observing portions of ambulatory 02 saturation study/ and generating customized AVS unique to this office visit / same day charting = 46 min new pt eval                 Patient Instructions  Continue trelegy   Leave 02 at 2lpm at house and bedtime   Make sure you check your oxygen saturation  AT  your highest level of activity (not after you stop)   to be sure it stays over 90% and adjust  02 flow upward to maintain this level if needed but remember to turn it back to previous settings when you stop (to conserve your supply).    Please remember to go to the  x-ray  department  for your tests - we will call you with the results when they are available     Please schedule a follow up visit in 3 months but call sooner if needed with PFTs on return    Ozell America, MD 07/15/2024

## 2024-07-14 ENCOUNTER — Encounter: Payer: Self-pay | Admitting: Internal Medicine

## 2024-07-14 ENCOUNTER — Ambulatory Visit (INDEPENDENT_AMBULATORY_CARE_PROVIDER_SITE_OTHER): Admitting: Internal Medicine

## 2024-07-14 ENCOUNTER — Ambulatory Visit

## 2024-07-14 VITALS — BP 139/80 | HR 77 | Temp 97.8°F | Ht 69.0 in | Wt 256.8 lb

## 2024-07-14 DIAGNOSIS — J449 Chronic obstructive pulmonary disease, unspecified: Secondary | ICD-10-CM | POA: Diagnosis not present

## 2024-07-14 DIAGNOSIS — F17210 Nicotine dependence, cigarettes, uncomplicated: Secondary | ICD-10-CM | POA: Diagnosis not present

## 2024-07-14 DIAGNOSIS — J9611 Chronic respiratory failure with hypoxia: Secondary | ICD-10-CM

## 2024-07-14 NOTE — Assessment & Plan Note (Signed)
 Active smoker started on 2lpm @ admit 05/05/24  - PFTs 06/19/15  FVC 1.70 [37%],  FEV1 1.05 [31%)  Ratio 0.64 - 07/14/2024 trelegy 100 every day and return for pfts in 3 m  Clearly  Group D (now reclassified as E) in terms of symptom/risk and laba/lama/ICS  therefore appropriate rx at this point >>>  continue trelegy and approp saba:  Re SABA :  I spent extra time with pt today reviewing appropriate use of albuterol  for prn use on exertion with the following points: 1) saba is for relief of sob that does not improve by walking a slower pace or resting but rather if the pt does not improve after trying this first. 2) If the pt is convinced, as many are, that saba helps recover from activity faster then it's easy to tell if this is the case by re-challenging : ie stop, take the inhaler, then p 5 minutes try the exact same activity (intensity of workload) that just caused the symptoms and see if they are substantially diminished or not after saba 3) if there is an activity that reproducibly causes the symptoms, try the saba 15 min before the activity on alternate days   If in fact the saba really does help, then fine to continue to use it prn but advised may need to look closer at the maintenance regimen (trelegy for now)  being used to achieve better control of airways disease with exertion.   >>> return for pfts and alpha one AT phenotype next ov @ 3 m, call sooner prn

## 2024-07-14 NOTE — Patient Instructions (Addendum)
 Continue trelegy   Leave 02 at 2lpm at house and bedtime   Make sure you check your oxygen saturation  AT  your highest level of activity (not after you stop)   to be sure it stays over 90% and adjust  02 flow upward to maintain this level if needed but remember to turn it back to previous settings when you stop (to conserve your supply).    Please remember to go to the  x-ray department  for your tests - we will call you with the results when they are available     Please schedule a follow up visit in 3 months but call sooner if needed with PFTs on return

## 2024-07-15 DIAGNOSIS — J9611 Chronic respiratory failure with hypoxia: Secondary | ICD-10-CM | POA: Insufficient documentation

## 2024-07-15 NOTE — Assessment & Plan Note (Addendum)
 Placed on 02 at admit 04/2024  - 07/14/2024   Walked on RA x 50 ft dropped to 86%   then completed 200 ft on POC upt o 6lpm with sats 88%  slow pace with cane   Rec: 2lpm at hs and at home but with > 50 ft walking:   Make sure you check your oxygen saturation  AT  your highest level of activity (not after you stop)   to be sure it stays over 90% and adjust  02 flow upward to maintain this level if needed but remember to turn it back to previous settings when you stop (to conserve your supply).

## 2024-07-15 NOTE — Assessment & Plan Note (Addendum)
 Counseled re importance of smoking cessation but did not meet time criteria for separate billing    Each maintenance medication was reviewed in detail including emphasizing most importantly the difference between maintenance and prns and under what circumstances the prns are to be triggered using an action plan format where appropriate.  Total time for H and P, chart review, counseling, reviewing hfa/dpi/ neb/02 /pulse ox  device(s) , directly observing portions of ambulatory 02 saturation study/ and generating customized AVS unique to this office visit / same day charting = 46 min new pt eval

## 2024-07-16 ENCOUNTER — Other Ambulatory Visit (HOSPITAL_COMMUNITY): Payer: Self-pay

## 2024-07-16 ENCOUNTER — Encounter: Payer: Self-pay | Admitting: Student

## 2024-07-16 ENCOUNTER — Ambulatory Visit (INDEPENDENT_AMBULATORY_CARE_PROVIDER_SITE_OTHER): Admitting: Student

## 2024-07-16 VITALS — BP 133/80 | HR 75 | Ht 69.0 in | Wt 248.4 lb

## 2024-07-16 DIAGNOSIS — L989 Disorder of the skin and subcutaneous tissue, unspecified: Secondary | ICD-10-CM | POA: Diagnosis present

## 2024-07-16 NOTE — Assessment & Plan Note (Signed)
 Chronic lesion on right hand with changes in size and appearance. Doesn't appear infected and given chronic nature and increase in size over the years differential includes skin cancer - Refer to dermatology for evaluation. - Schedule biopsy. - Consider laser removal post-biopsy if indicated.

## 2024-07-16 NOTE — Patient Instructions (Addendum)
 Pleasure to see you today.  For the lesion on your right hand I recommend you see our dermatology clinic who we will get a biopsy of the lesion to rule out cancer.  Your appointment is scheduled for 08/12/2024 at 2:50PM

## 2024-07-16 NOTE — Progress Notes (Signed)
    SUBJECTIVE:   CHIEF COMPLAINT / HPI:   George Leon is a 68 year old male who presents with a persistent skin lesion on his right hand.  The lesion on his right hand has been present for years, initially appearing as a small area of scale with breaks on either side and a smooth center. It has increased in size and changed in appearance, now featuring a new spot in the middle. He experienced soreness after tearing a piece of skin off the lesion while putting his hand in his pocket. The lesion occasionally itches and can be painful with certain hand movements.  PERTINENT  PMH / PSH: Reviewed   OBJECTIVE:   BP 133/80   Pulse 75   Ht 5' 9 (1.753 m)   Wt 248 lb 6.4 oz (112.7 kg)   SpO2 98%   BMI 36.68 kg/m    Physical Exam General: Alert, well appearing, NAD Cardiovascular: RRR, No Murmurs, Normal S2/S2 Respiratory: CTAB, No wheezing or Rales Skin: 5mm well circumscribed scarbed lesion on the dorsal right hand    ASSESSMENT/PLAN:   Skin lesion of hand Chronic lesion on right hand with changes in size and appearance. Doesn't appear infected and given chronic nature and increase in size over the years differential includes skin cancer - Refer to dermatology for evaluation. - Schedule biopsy. - Consider laser removal post-biopsy if indicated.    Norleen April, MD [facility]

## 2024-07-21 ENCOUNTER — Ambulatory Visit: Payer: Self-pay | Admitting: Internal Medicine

## 2024-07-22 ENCOUNTER — Encounter: Payer: Self-pay | Admitting: Student

## 2024-07-22 DIAGNOSIS — M5090 Cervical disc disorder, unspecified, unspecified cervical region: Secondary | ICD-10-CM

## 2024-07-22 DIAGNOSIS — G894 Chronic pain syndrome: Secondary | ICD-10-CM

## 2024-07-23 ENCOUNTER — Encounter: Payer: Self-pay | Admitting: Cardiology

## 2024-07-23 ENCOUNTER — Ambulatory Visit: Attending: Cardiology | Admitting: Cardiology

## 2024-07-23 VITALS — BP 104/80 | HR 88 | Resp 16 | Ht 69.0 in | Wt 257.2 lb

## 2024-07-23 DIAGNOSIS — I10 Essential (primary) hypertension: Secondary | ICD-10-CM | POA: Diagnosis present

## 2024-07-23 DIAGNOSIS — F17210 Nicotine dependence, cigarettes, uncomplicated: Secondary | ICD-10-CM | POA: Diagnosis present

## 2024-07-23 DIAGNOSIS — Z950 Presence of cardiac pacemaker: Secondary | ICD-10-CM | POA: Diagnosis present

## 2024-07-23 DIAGNOSIS — I739 Peripheral vascular disease, unspecified: Secondary | ICD-10-CM | POA: Diagnosis present

## 2024-07-23 DIAGNOSIS — I441 Atrioventricular block, second degree: Secondary | ICD-10-CM | POA: Insufficient documentation

## 2024-07-23 DIAGNOSIS — I5032 Chronic diastolic (congestive) heart failure: Secondary | ICD-10-CM | POA: Insufficient documentation

## 2024-07-23 MED ORDER — OXYCODONE-ACETAMINOPHEN 5-325 MG PO TABS: 1.0000 | ORAL_TABLET | Freq: Four times a day (QID) | ORAL | 0 refills | Status: DC | PRN

## 2024-07-23 NOTE — Patient Instructions (Signed)
 Medication Instructions:  No medication changes were made at this visit. Continue current regimen.   *If you need a refill on your cardiac medications before your next appointment, please call your pharmacy*  Lab Work: None ordered today. If you have labs (blood work) drawn today and your tests are completely normal, you will receive your results only by: MyChart Message (if you have MyChart) OR A paper copy in the mail If you have any lab test that is abnormal or we need to change your treatment, we will call you to review the results.  Testing/Procedures: None ordered today.  Follow-Up: At [facility], you and your health needs are our priority.  As part of our continuing mission to provide you with exceptional heart care, our providers are all part of one team.  This team includes your primary Cardiologist (physician) and Advanced Practice Providers or APPs (Physician Assistants and Nurse Practitioners) who all work together to provide you with the care you need, when you need it.  Your next appointment:   1 year(s)  Provider:   Madonna Large, DO

## 2024-07-23 NOTE — Progress Notes (Signed)
 Cardiology Office Note:  .   Date:  07/23/2024  ID:  George Leon, DOB June 05, 1956, MRN [id number] PCP:  Manon Jester, DO  Former Cardiology Providers: None [facility] Providers Cardiologist:  Madonna Large, DO, [facility] (established care 07/02/2021)  Electrophysiologist:  Danelle Birmingham, MD  Electrophysiologist:  Danelle Birmingham, MD  Click to update primary MD,subspecialty MD or APP then REFRESH:1}    Chief Complaint  Patient presents with   Chronic heart failure with preserved ejection fraction    Follow-up    History of Present Illness: George   ORLANDER Leon is a 68 y.o. Caucasian male whose past medical history and cardiovascular risk factors includes:  Status post dual-chamber pacemaker for symptomatic bradycardia and complete heart block, HFpEF, hypertension, hyperlipidemia, chronic stage IIIa chronic kidney disease,  obesity due to excess calories, prior tobacco use, COPD.   He is followed by the practice given his history of chronic HFpEF and history of symptomatic second-degree type II AV block status post pacemaker implant.  During initial office visit in July 2022 he was noted to be in second-degree type II AV block /as well as complete heart block and was quite symptomatic.  He underwent pacemaker implantation with Dr. Birmingham.  Subsequently had a left heart catheterization which noted no significant epicardial coronary disease.  And has medications have been uptitrated given his underlying HFpEF.   Last seen in office in July 2025 at which time his amlodipine  was discontinued and was started on spironolactone  25 mg p.o. daily.  He had follow-up labs on 07/06/2024 and renal function remained stable.  He had a chest x-ray on 07/14/2024 which noted no acute processes.  He has lost 2 pounds in his last office visit.  Since last office visit denies chest pain or heart failure symptoms. He endorses that swelling has significantly improved.  Review of Systems: .   Review of  Systems  Constitutional: Positive for weight loss.  Cardiovascular:  Positive for dyspnea on exertion (chronic and stable). Negative for chest pain, claudication, irregular heartbeat, leg swelling, near-syncope, orthopnea, palpitations, paroxysmal nocturnal dyspnea and syncope.  Respiratory:  Positive for shortness of breath.   Hematologic/Lymphatic: Negative for bleeding problem.    Studies Reviewed:   Echocardiogram: 07/03/2021: LVEF 60-65%, no regional wall motion abnormalities, mild LVH, grade 1 diastolic impairment, normal right ventricular size and function, mild MR, right atrial pressure 15 mmHg.   Heart catheterization: Left Heart Catheterization 07/03/21: No significant CAD.  Moderately elevated EDP. LV: 125 mmHg / 12 mmHg, EDP 22 mmHg.  Aortic pressure 109/46 with a mean of 69 mmHg.  There was no pressure gradient across the aortic valve.  LVEDP moderately elevated.  LVEF 65%, no significant MR. LM: Large vessel, normal. LAD: Very large vessel, gives origin to 3 large diagonals, very mild disease is evident with mild calcification in the proximal segment. CX: Moderate sized vessel, smooth and normal. RCA: Very large vessel, PL branch is very large.  There is mild disease in the distal RCA.  Mild calcification is evident in the proximal segment. Recommendation: No reversible cause for heart block.  He will be scheduled for pacemaker implantation tomorrow.   Lower Extremity Arterial Duplex 08/10/2021:  Proximal to mid right SFA >50% stenosis with monophasic and dampened  waveform pattern. There is diffuse disease and monophasic waveform pattern in the right below knee small arteries.  No hemodynamically significant stenosis identified in the left lower extremity. There is diffuse plaque and severely abnormal monophasic waveform  in  the small vessels below knee.  This exam reveals mildly decreased perfusion of the right lower extremity, noted at the anterior tibial artery level (ABI   0.90) and moderately decreased perfusion of the left lower extremity at the post tibial artery level (ABI 0.71).    Abdominal Aortic Duplex 08/10/2021:  The maximum aorta (sac) diameter is 2.06 cm (prox). No AAA observed. No evidence of significant  atherosclerotic plaque. Normal flow velocities noted in bilateral common iliac arteries.    Pacemaker Biotronik Edora dual chamber pacemaker implantation 07/04/2021   In office device interrogation: April 1st 2025 Normal in-clinic dual chamber ICD check. Presenting Rhythm: AS-VP . Routine testing was performed. Thresholds, sensing, and impedance demonstrate stable parameters and no programming changes needed. No treated arrhythmias, rare NSVT. Estimated longevity  13yr20mo. Pt enrolled in remote follow-up.   RADIOLOGY: NA  Risk Assessment/Calculations:   NA   Labs:       Latest Ref Rng & Units 05/06/2024    6:59 AM 05/05/2024    6:05 PM 04/29/2024    4:15 PM  CBC  WBC 4.0 - 10.5 K/uL 16.8  15.6  13.8   Hemoglobin 13.0 - 17.0 g/dL 83.1  83.3  83.2   Hematocrit 39.0 - 52.0 % 51.2  51.6  49.2   Platelets 150 - 400 K/uL 229  209  208        Latest Ref Rng & Units 07/06/2024    3:22 PM 05/06/2024    9:03 AM 05/05/2024    6:05 PM  BMP  Glucose 70 - 99 mg/dL 92  892  880   BUN 8 - 27 mg/dL 10  14  8    Creatinine 0.76 - 1.27 mg/dL 9.32  9.28  9.38   BUN/Creat Ratio 10 - 24 15     Sodium 134 - 144 mmol/L 138  135  134   Potassium 3.5 - 5.2 mmol/L 4.4  4.9  4.4   Chloride 96 - 106 mmol/L 100  97  98   CO2 20 - 29 mmol/L 22  29  25    Calcium 8.6 - 10.2 mg/dL 9.1  9.7  9.0       Latest Ref Rng & Units 07/06/2024    3:22 PM 05/06/2024    9:03 AM 05/05/2024    6:05 PM  CMP  Glucose 70 - 99 mg/dL 92  892  880   BUN 8 - 27 mg/dL 10  14  8    Creatinine 0.76 - 1.27 mg/dL 9.32  9.28  9.38   Sodium 134 - 144 mmol/L 138  135  134   Potassium 3.5 - 5.2 mmol/L 4.4  4.9  4.4   Chloride 96 - 106 mmol/L 100  97  98   CO2 20 - 29 mmol/L 22  29  25     Calcium 8.6 - 10.2 mg/dL 9.1  9.7  9.0   Total Protein 6.5 - 8.1 g/dL   6.4   Total Bilirubin 0.0 - 1.2 mg/dL   0.8   Alkaline Phos 38 - 126 U/L   80   AST 15 - 41 U/L   20   ALT 0 - 44 U/L   17     Lab Results  Component Value Date   CHOL 146 04/02/2021   HDL 35 (L) 04/02/2021   LDLCALC 86 04/02/2021   LDLDIRECT 85 07/22/2012   TRIG 140 04/02/2021   CHOLHDL 4.2 04/02/2021   No results for input(s): LIPOA in the last  8760 hours. No components found for: NTPROBNP Recent Labs    07/06/24 1523  PROBNP 48   Recent Labs    04/29/24 1615  TSH 1.260    Physical Exam:    Today's Vitals   07/23/24 1042  BP: 104/80  Pulse: 88  Resp: 16  SpO2: 91%  Weight: 257 lb 3.2 oz (116.7 kg)  Height: 5' 9 (1.753 m)   Body mass index is 37.98 kg/m. Wt Readings from Last 3 Encounters:  07/23/24 257 lb 3.2 oz (116.7 kg)  07/16/24 248 lb 6.4 oz (112.7 kg)  07/14/24 256 lb 12.8 oz (116.5 kg)    Physical Exam  Constitutional: No distress. He appears chronically ill.  hemodynamically stable, walks w/ cane  Neck: No JVD present.  Cardiovascular: Normal rate, regular rhythm, S1 normal and S2 normal. Exam reveals no gallop, no S3 and no S4.  No murmur heard. Pulses:      Dorsalis pedis pulses are 1+ on the right side and 1+ on the left side.       Posterior tibial pulses are 0 on the right side and 0 on the left side.  Pulmonary/Chest: No stridor. He has no wheezes. He has no rales.  Pacemaker noted. Decreased breath sounds bilateral  Musculoskeletal:        General: Edema (trace bilateral) present.     Cervical back: Neck supple.  Skin: Skin is warm.     Impression & Recommendation(s):  Impression:   ICD-10-CM   1. Chronic heart failure with preserved ejection fraction (HCC)  I50.32     2. Second degree AV block, Mobitz type II  I44.1     3. Cardiac pacemaker in situ  Z95.0     4. Benign hypertension  I10     5. PAD (peripheral artery disease) (HCC)  I73.9      6. Continuous dependence on cigarette smoking  F17.210        Recommendation(s):  Chronic heart failure with preserved ejection fraction (HCC) Stage B, NYHA class II. Continue Entresto  49/51 mg p.o. twice daily. Continue Toprol -XL 100 mg p.o. every morning. Continue Jardiance  10 mg p.o. daily. Continue spironolactone  25 mg p.o. daily. Follow-up blood work notes stable renal function and potassium levels.   Echocardiogram is scheduled for 07/27/2024.  Second degree AV block, Mobitz type II Cardiac pacemaker in situ Underwent pacemaker implant in July 2022. Recently had his office 1 year check on March 18, 2024, results reviewed  Benign hypertension Office blood pressures are soft but patient is asymptomatic. Medications as discussed above. Reemphasized importance of low-salt diet.  PAD (peripheral artery disease) (HCC) Lower extremity arterial duplex in the past as illustrated PAD. Denies claudication. Reemphasize importance of improving modifiable cardiovascular risk factors-especially complete smoking cessation. Monitor for now  Morbidity stop smoking abruptly 1.5 weeks ago.  Congratulated on his efforts and reemphasized its importance.  Patient currently uses nasal cannula oxygen on prn basis.   Orders Placed:  No orders of the defined types were placed in this encounter.    Final Medication List:    No orders of the defined types were placed in this encounter.   There are no discontinued medications.    Current Outpatient Medications:    albuterol  (VENTOLIN  HFA) 108 (90 Base) MCG/ACT inhaler, USE 1 TO 2 INHALATIONS EVERY 4 HOURS AS NEEDED FOR SHORTNESS OF BREATH/WHEEZE/COUGH., Disp: 17 g, Rfl: 4   aspirin  EC 81 MG tablet, Take 1 tablet (81 mg total) by mouth daily. Swallow whole.,  Disp: 30 tablet, Rfl: 12   atorvastatin (LIPITOR) 80 MG tablet, TAKE 1 TABLET BY MOUTH DAILY, Disp: 90 tablet, Rfl: 3   diclofenac Sodium (VOLTAREN) 1 % GEL, Apply 2 g topically 4 (four)  times daily. (Patient taking differently: Apply 2 g topically daily as needed (for pain).), Disp: 2 g, Rfl: 11   DULoxetine (CYMBALTA) 60 MG capsule, TAKE 1 CAPSULE BY MOUTH DAILY, Disp: 90 capsule, Rfl: 3   Elastic Bandages & Supports (MEDICAL COMPRESSION STOCKINGS) MISC, 1 each by Does not apply route once., Disp: 2 each, Rfl: 0   empagliflozin (JARDIANCE) 10 MG TABS tablet, Take 1 tablet (10 mg total) by mouth daily before breakfast., Disp: 90 tablet, Rfl: 3   esomeprazole (NEXIUM) 40 MG capsule, TAKE 1 CAPSULE DAILY AT 12 NOON, Disp: 90 capsule, Rfl: 3   ezetimibe (ZETIA) 10 MG tablet, TAKE 1 TABLET BY MOUTH EVERY DAY (Patient taking differently: Take 10 mg by mouth every evening.), Disp: 90 tablet, Rfl: 2   Fluticasone-Umeclidin-Vilant (TRELEGY ELLIPTA) 100-62.5-25 MCG/ACT AEPB, Inhale 1 puff into the lungs daily., Disp: 1 each, Rfl: 2   metoprolol succinate (TOPROL-XL) 100 MG 24 hr tablet, TAKE 1 TABLET(100 MG) BY MOUTH EVERY MORNING WITH OR IMMEDIATELY FOLLOWING A MEAL, Disp: 90 tablet, Rfl: 0   Multiple Vitamins-Minerals (OCUVITE ADULT FORMULA) CAPS, Take 1 capsule by mouth daily. (Patient taking differently: Take 1 capsule by mouth 2 (two) times daily.), Disp: 90 capsule, Rfl: 3   naloxone (NARCAN) nasal spray 4 mg/0.1 mL, Please administered to nostrils for decreased responsiveness or decreased breathing effort, Disp: 1 each, Rfl: 3   nystatin cream (MYCOSTATIN), APPLY TOPICALLY TWICE A DAY (Patient taking differently: Apply 1 Application topically 2 (two) times daily as needed for dry skin.), Disp: 30 g, Rfl: 23   sacubitril-valsartan (ENTRESTO) 49-51 MG, Take 1 tablet by mouth 2 (two) times daily., Disp: 180 tablet, Rfl: 3   selenium sulfide (SELSUN) 2.5 % shampoo, APPLY 1 APPLICATION TOPICALLY DAILY AS NEEDED FOR IRRITATION, Disp: 120 mL, Rfl: 3   sodium chloride (OCEAN) 0.65 % SOLN nasal spray, Place 1 spray into both nostrils as needed for congestion., Disp: , Rfl:    spironolactone  (ALDACTONE) 25 MG tablet, Take 1 tablet (25 mg total) by mouth daily., Disp: 30 tablet, Rfl: 3   oxyCODONE-acetaminophen (PERCOCET) 5-325 MG tablet, Take 1 tablet by mouth every 6 (six) hours as needed for severe pain (pain score 7-10)., Disp: 120 tablet, Rfl: 0  Consent:   NA  Disposition:   1 year follow-up sooner if needed  His questions and concerns were addressed to his satisfaction. He voices understanding of the recommendations provided during this encounter.    Signed, Madonna Michele HAS, [facility]  A Division of [facility] [address], [place], [place] [address]  [place], [place] [address]

## 2024-07-24 ENCOUNTER — Encounter: Payer: Self-pay | Admitting: Cardiology

## 2024-07-27 ENCOUNTER — Ambulatory Visit (HOSPITAL_COMMUNITY)

## 2024-08-06 ENCOUNTER — Other Ambulatory Visit: Payer: Self-pay | Admitting: Cardiology

## 2024-08-06 DIAGNOSIS — I5032 Chronic diastolic (congestive) heart failure: Secondary | ICD-10-CM

## 2024-08-12 ENCOUNTER — Other Ambulatory Visit (HOSPITAL_COMMUNITY): Payer: Self-pay

## 2024-08-12 ENCOUNTER — Ambulatory Visit (INDEPENDENT_AMBULATORY_CARE_PROVIDER_SITE_OTHER)

## 2024-08-12 VITALS — BP 106/62 | HR 72 | Wt 260.8 lb

## 2024-08-12 DIAGNOSIS — L989 Disorder of the skin and subcutaneous tissue, unspecified: Secondary | ICD-10-CM

## 2024-08-12 MED ORDER — FLUOROURACIL 5 % EX CREA: TOPICAL_CREAM | Freq: Two times a day (BID) | CUTANEOUS | 0 refills | Status: DC

## 2024-08-12 MED ORDER — FLUOROURACIL 5 % EX CREA
TOPICAL_CREAM | Freq: Two times a day (BID) | CUTANEOUS | 0 refills | Status: AC
  Filled 2024-08-12: qty 40, 30d supply, fill #0

## 2024-08-12 NOTE — Progress Notes (Signed)
    SUBJECTIVE:   CHIEF COMPLAINT / HPI:   George Leon presents today for eval of a long standing lesion on his R hand. Spot has been there since 2016 with a typical 2 month cycle of scabbing and falling off, with smooth pink skin underneath and surrounding. He's never had it formally evaluated before. Topical triamcinolone  and nystatin  creams helped to get the scab to fall off but did not break the cycle. It is neither painful nor itchy, but he wanted to get it checked out because it's been around for so long. He reports a multi decade history of working outside in the sun.   OBJECTIVE:   BP 106/62   Pulse 72   Wt 260 lb 12.8 oz (118.3 kg)   SpO2 93%   BMI 38.51 kg/m   General: Awake, alert, NAD. Communicates clearly. Skin: 5x77mm lesion on dorsal R hand w/ small scab overlying pink, smooth area w/ dry, flaky skin surrounding.   ASSESSMENT/PLAN:   Assessment & Plan Hand lesion AK most likely, possible BCC or SCC 5FU BID x 7m, protect from the sun  Refer to Derm for whole body skin exam given extensive sun exposure   Patient verbalized understanding and agrees w/ plan.   Leafy Scriver, DO [facility]

## 2024-08-12 NOTE — Patient Instructions (Signed)
 Thank you for coming in today!  We discussed your scab on the back of your right hand. While it could be multiple things, the most likely is an actinic keratosis. This is a small group of abnormal skin cells that only rarely can become cancer. We're going to treat this with 5FU, a chemotherapy cream that will help kill the cells and prevent progression. Use this cream twice a day for a month, and avoid exposing it to the sun as it can become red and irritated with too much sun exposure.   In addition, it's appropriate for you to see a dermatologist at some point to do a more comprehensive evaluation of any other spots on your skin, such as the mole behind your ear. You can expect a call from them to schedule an appointment in the future.    Thank you for choosing [facility].   Please call [phone] with any questions about today's appointment.  Please be sure to schedule follow up at the front  desk before you leave today.   George Scriver, DO Family Medicine

## 2024-08-20 ENCOUNTER — Encounter: Payer: Self-pay | Admitting: Student

## 2024-08-23 ENCOUNTER — Encounter: Payer: Self-pay | Admitting: Student

## 2024-08-23 DIAGNOSIS — M5090 Cervical disc disorder, unspecified, unspecified cervical region: Secondary | ICD-10-CM

## 2024-08-23 DIAGNOSIS — G894 Chronic pain syndrome: Secondary | ICD-10-CM

## 2024-08-23 MED ORDER — OXYCODONE-ACETAMINOPHEN 5-325 MG PO TABS
1.0000 | ORAL_TABLET | Freq: Four times a day (QID) | ORAL | 0 refills | Status: DC | PRN
Start: 2024-08-23 — End: 2024-09-20

## 2024-08-30 ENCOUNTER — Ambulatory Visit (HOSPITAL_COMMUNITY)
Admission: RE | Admit: 2024-08-30 | Discharge: 2024-08-30 | Disposition: A | Discharge status: Home / Self Care | Source: Ambulatory Visit | Attending: Cardiology | Admitting: Cardiology

## 2024-08-30 DIAGNOSIS — I5032 Chronic diastolic (congestive) heart failure: Secondary | ICD-10-CM | POA: Diagnosis not present

## 2024-08-30 LAB — ECHOCARDIOGRAM COMPLETE: S' Lateral: 2.94 cm

## 2024-09-01 NOTE — Progress Notes (Signed)
 MyChart message containing providers result note and interpretation read by patient proxy on: Last read by George Leon at 6:26PM on 08/31/2024.

## 2024-09-03 ENCOUNTER — Other Ambulatory Visit: Payer: Self-pay | Admitting: Student

## 2024-09-03 DIAGNOSIS — I5032 Chronic diastolic (congestive) heart failure: Secondary | ICD-10-CM

## 2024-09-10 ENCOUNTER — Other Ambulatory Visit (HOSPITAL_COMMUNITY): Payer: Self-pay

## 2024-09-10 ENCOUNTER — Other Ambulatory Visit: Payer: Self-pay | Admitting: Student

## 2024-09-10 DIAGNOSIS — E785 Hyperlipidemia, unspecified: Secondary | ICD-10-CM

## 2024-09-16 ENCOUNTER — Ambulatory Visit: Payer: Medicare Other

## 2024-09-16 DIAGNOSIS — I5032 Chronic diastolic (congestive) heart failure: Secondary | ICD-10-CM | POA: Diagnosis not present

## 2024-09-16 LAB — CUP PACEART REMOTE DEVICE CHECK
Date Time Interrogation Session: 20251009090212
Implantable Lead Connection Status: 753985
Implantable Lead Connection Status: 753985
Implantable Lead Implant Date: 20220727
Implantable Lead Implant Date: 20220727
Implantable Lead Location: 753859
Implantable Lead Location: 753860
Implantable Lead Model: 377171
Implantable Lead Model: 377171
Implantable Lead Serial Number: 8000456432
Implantable Lead Serial Number: 8000477614
Implantable Pulse Generator Implant Date: 20220727
Pulse Gen Model: 407145
Pulse Gen Serial Number: 70164524

## 2024-09-16 NOTE — Progress Notes (Signed)
 Remote PPM Transmission

## 2024-09-17 ENCOUNTER — Other Ambulatory Visit: Payer: Self-pay | Admitting: Student

## 2024-09-17 DIAGNOSIS — E785 Hyperlipidemia, unspecified: Secondary | ICD-10-CM

## 2024-09-18 ENCOUNTER — Encounter: Payer: Self-pay | Admitting: Student

## 2024-09-20 MED ORDER — HYDROCODONE-ACETAMINOPHEN 7.5-325 MG PO TABS: 1.0000 | ORAL_TABLET | Freq: Four times a day (QID) | ORAL | 0 refills | Status: DC | PRN

## 2024-09-20 NOTE — Addendum Note (Signed)
 Addended by: MANON JESTER on: 09/20/2024 10:52 AM   Modules accepted: Orders

## 2024-09-21 NOTE — Progress Notes (Signed)
 Remote PPM Transmission

## 2024-09-22 ENCOUNTER — Ambulatory Visit: Payer: Self-pay | Admitting: Internal Medicine

## 2024-09-29 ENCOUNTER — Encounter: Payer: Self-pay | Admitting: Student

## 2024-10-03 MED ORDER — TRELEGY ELLIPTA 100-62.5-25 MCG/ACT IN AEPB: 1.0000 | INHALATION_SPRAY | Freq: Every day | RESPIRATORY_TRACT | 2 refills | Status: DC

## 2024-10-08 ENCOUNTER — Other Ambulatory Visit: Payer: Self-pay | Admitting: Cardiology

## 2024-10-08 DIAGNOSIS — I5032 Chronic diastolic (congestive) heart failure: Secondary | ICD-10-CM

## 2024-10-11 ENCOUNTER — Other Ambulatory Visit (HOSPITAL_COMMUNITY): Payer: Self-pay

## 2024-10-11 MED ORDER — SPIRONOLACTONE 25 MG PO TABS
25.0000 mg | ORAL_TABLET | Freq: Every day | ORAL | 2 refills | Status: AC
  Filled 2024-10-11: qty 90, 90d supply, fill #0

## 2024-10-14 ENCOUNTER — Ambulatory Visit

## 2024-10-18 ENCOUNTER — Ambulatory Visit

## 2024-10-20 ENCOUNTER — Encounter: Payer: Self-pay | Admitting: Student

## 2024-10-20 ENCOUNTER — Other Ambulatory Visit (HOSPITAL_COMMUNITY): Payer: Self-pay

## 2024-10-21 MED ORDER — HYDROCODONE-ACETAMINOPHEN 7.5-325 MG PO TABS: 1.0000 | ORAL_TABLET | Freq: Four times a day (QID) | ORAL | 0 refills | Status: DC | PRN

## 2024-10-31 ENCOUNTER — Encounter: Payer: Self-pay | Admitting: Internal Medicine

## 2024-10-31 ENCOUNTER — Encounter: Payer: Self-pay | Admitting: Student

## 2024-11-01 ENCOUNTER — Ambulatory Visit: Admitting: Internal Medicine

## 2024-11-01 ENCOUNTER — Ambulatory Visit

## 2024-11-01 ENCOUNTER — Encounter

## 2024-11-01 NOTE — Progress Notes (Deleted)
 George Leon, male    DOB: 08/16/1956   MRN: [id number]   Brief patient profile:  68 yowm   sometime still smoker  remote Mannam pt  referred to pulmonary clinic 07/14/2024 by [facility]  for new 02 dep resp failure in setting of longtime copd   p admit 05/05/24      PFTs 06/19/15 GOLD 3 COPD  FVC 1.70 [37%] FEV1 1.05 [31%) Ratio 0.64  Date of Admission: 05/05/2024                 Date of Discharge: 05/06/2024  Indication for Hospitalization: COPD exacerbation, acute hypoxemic respiratory failure   Discharge Diagnoses/Problem List:  Principal Problem for Admission: COPD exacerbation, acute hypoxemic respiratory failu:  COPD exacerbation   Acute hypoxemic respiratory failure (HCC)   Chronic health problem   Dyspnea       Brief Hospital Course:  FB is a 68yo M w/ hx of COPD, pulm HTN, HTN, HLD, complete heart block with pacemaker, CHF, PVD that was admitted for acute hypoxic respiratory failure.   AHRF  COPD Exacerbation  Pt presented to PCP with worsening wheezing and new oxygen requirement. Pt was direct admitted to [facility] service. Pt was satting well on RA, but desatted w/ exertion. CXR showed pulmonary edema versus George infection. Pt was diagnosed w/ COPD exacerbation and treated steroids (prednisone  x5days, last dose 6/1). Pt was also treated w/ duonebs and home Trelegy (formulary alternative). At time of discharge, pt was still requiring 2L to maintain 92%, so was sent home w/ home oxygen.      PCP Recommendations 1) Monitor for improvement of acute COPD exac. Pt may need chronic O2 at baseline.    Disposition: Home with oxygen   General: Calm, cooperative, not in acute distress Pulmonary: Normal effort, clear to auscultation including no wheezing Cardio: Normal rate and rhythm.  Normal heart sounds. Abdominal: Soft and nontender Extremities: Trace lower extremity and bilaterally (chronic), warm and dry, no wounds          History of Present Illness  07/14/2024   Pulmonary/ 1st office eval/[facility] [place] maint on Trelegy since admit / 02 / still smoking and requesting POC  Chief Complaint  Patient presents with   Consult    Referred while pt was in the hospital for SOB. Pt was ordered o2 in the hospital.   Dyspnea:  can do 3 aisles at grocery store HT or [place] and usually does HC Parking / bad back/ balance uses cane/bad knees also  Cough: slt rattle / min mucoid  Sleep: in recline 30 degrees and 2lpm  SABA use: once daily  02 use:on return to house - has portable not using  Patient Instructions  Continue trelegy  Leave 02 at 2lpm at house and bedtime  Make sure you check your oxygen saturation  AT  your highest level of activity (not after you stop)   to be sure it stays over 90%   Please schedule a follow up visit in 3 months but call sooner if needed with PFTs on return  Cxr ok    11/01/2024  f/u ov/Bernetta Sutley re: ***   maint on ***  No chief complaint on file.   Dyspnea:  *** Cough: *** Sleeping: *** resp cc  SABA use: *** 02: ***  Lung cancer screening :  ***    No obvious day to day or daytime variability or assoc excess/ purulent sputum or mucus plugs or hemoptysis or cp or chest tightness, subjective  wheeze or overt sinus or hb symptoms.    Also denies any obvious fluctuation of symptoms with weather or environmental changes or other aggravating or alleviating factors except as outlined above   No unusual exposure hx or h/o childhood pna/ asthma or knowledge of premature birth.  Current Allergies, Complete Past Medical History, Past Surgical History, Family History, and Social History were reviewed in [employer] record.  ROS  The following are not active complaints unless bolded Hoarseness, sore throat, dysphagia, dental problems, itching, sneezing,  nasal congestion or discharge of excess mucus or purulent secretions, ear ache,   fever, chills, sweats, unintended wt loss or wt gain, classically pleuritic or exertional cp,   orthopnea pnd or arm/hand swelling  or leg swelling, presyncope, palpitations, abdominal pain, anorexia, nausea, vomiting, diarrhea  or change in bowel habits or change in bladder habits, change in stools or change in urine, dysuria, hematuria,  rash, arthralgias, visual complaints, headache, numbness, weakness or ataxia or problems with walking or coordination,  change in mood or  memory.        No outpatient medications have been marked as taking for the 11/01/24 encounter (Appointment) with Darlean Ozell NOVAK, MD.          Past Medical History:  Diagnosis Date   Acute [facility])    Acute on chronic diastolic heart failure (HCC) 12/01/2015   Anxiety    Arthritis    qwhere (08/06/2017)   CHF (congestive heart failure) (HCC) dx'd 2016   Chronic back pain    all my back (08/06/2017)   Chronic bronchitis (HCC)    Chronic pain    Chronic pain syndrome 12/01/2015   Complete heart block (HCC)    COPD (chronic obstructive pulmonary disease) (HCC) 03/03/2024   O2 sat 89% to 91% on RA.  States he probably needs O2 all the time.   Dyspnea on exertion 03/03/2024   O2 sat 89% to 91% on RA. States he probably needs O2 all the time.   Emphysema of lung (HCC)    Encounter for care of pacemaker 07/04/2021   High cholesterol    High degree atrioventricular block    History of hiatal hernia    possible     Hypertension    Liver fatty degeneration    Macular degeneration, right eye    Pacemaker Biotronik Edora dual chamber transmission  07/04/2021   Pacemaker Dual chamber Biotronik Escobares 8dr-T Mri - [id number] pacemaker 07/04/2021 07/04/2021   Pneumonia  1990s X 1; 2016      Objective:      Vital signs reviewed  11/01/2024  - Note at rest 02 sats  ***% on ***   General appearance:    ***    Mod bar***     Assessment

## 2024-11-08 ENCOUNTER — Ambulatory Visit: Payer: Self-pay

## 2024-11-08 NOTE — Telephone Encounter (Signed)
 FYI Only or Action Required?: FYI only for provider: appointment scheduled on 11/11/24.  Patient is followed in Pulmonology for CHF, last seen on 07/14/2024 by Darlean Ozell NOVAK, MD.  Called Nurse Triage reporting Shortness of Breath.  Symptoms began several days ago.  Interventions attempted: Home oxygen use.  Symptoms are: gradually worsening.  Triage Disposition: Call PCP When Office is Open  Patient/caregiver understands and will follow disposition?: Yes   Copied from CRM #[id number]. Topic: Clinical - Red Word Triage >> Nov 08, 2024 12:01 PM Chantha C wrote: Red Word that prompted transfer to Nurse Triage: Patient [phone] states a few days ago, and oxygen level is low, the range is dropping down to 80, when patient is sleeping and waking he is gasping for air. Patient states was afraid to lay down last night due to his oxygen level dropping. Patient has an oxygen machine. Patient states shortness of breath only when moving around, wheezing, denies dizziness, pain nor fever. Patient is trying to scheduled a PFT and follow up with Dr. Darlean. Informed patient's wife Mrs. Wollman, next available is 01/17/25. Mrs. Auld is asking for patient to be seen sooner. Please advise. Reason for Disposition  [1] Fall in oxygen level 2% or more (below known patient baseline, awake while at rest) AND [2] three times in 1 week  Answer Assessment - Initial Assessment Questions 1. MAIN CONCERN OR SYMPTOM: What's your main concern? (e.g., low oxygen level, breathing difficulty) What question do you have?     Pt reports when he tries to lay down at night his O2 drops and he feels like he is gasping for air. Pt reports last night, 11/30, he was having difficulty breathing and his face did start to change in coloration per his wife. He stated once he was able to get to side of the bed, he checked his O2 and it was at 83%. Once he felt he was able to breath, he slept in his recliner without any issues the rest  of the night. Appointment scheduled for evaluation. Patient agrees with plan of care, and will call back if anything changes, or if symptoms worsen.    2. ONSET: When did the breathing difficulty start?      Over the last couple nights   3. OXYGEN THERAPY:      Yes  4.  OXYGEN EQUIPMENT:  Are you having any trouble with your oxygen equipment?  (e.g., cannula, mask, tubing, tank, concentrator)     Nasal cannula; no equipment complications at this time   5. OXYGEN SATURATION MONITOR:       Yes  6. OXYGEN LEVEL: What is your reading (oxygen level) today? What is your usual oxygen saturation reading? (e.g., 95%)     2L [place]; 93%   7. VSS MONITORING: Do you monitor/measure your oxygen level or vital signs? (e.g., yes, no, measurements are automatically sent to provider/call center). Document CURRENT and NORMAL BASELINE values if available.       Yes he manually checks; baseline is 87-91%  8. BREATHING DIFFICULTY: Are you having any difficulty breathing? If Yes, ask: How bad is it?  (e.g., none, mild, moderate, severe)      No current breathing difficulty. Only occurs at night when he lays down  9. OTHER SYMPTOMS: Do you have any other symptoms? (e.g., fever, change in sputum)     Pt reports a fever last week but that has since resolved  Protocols used: Oxygen Monitoring and Hypoxia-A-AH

## 2024-11-11 ENCOUNTER — Encounter: Payer: Self-pay | Admitting: Internal Medicine

## 2024-11-11 ENCOUNTER — Ambulatory Visit: Admitting: Internal Medicine

## 2024-11-11 VITALS — BP 116/76 | HR 99 | Temp 98.0°F | Ht 69.0 in | Wt 262.4 lb

## 2024-11-11 DIAGNOSIS — R0683 Snoring: Secondary | ICD-10-CM

## 2024-11-11 DIAGNOSIS — R062 Wheezing: Secondary | ICD-10-CM

## 2024-11-11 DIAGNOSIS — Z87891 Personal history of nicotine dependence: Secondary | ICD-10-CM | POA: Diagnosis not present

## 2024-11-11 DIAGNOSIS — R0681 Apnea, not elsewhere classified: Secondary | ICD-10-CM

## 2024-11-11 DIAGNOSIS — R0609 Other forms of dyspnea: Secondary | ICD-10-CM

## 2024-11-11 DIAGNOSIS — Z87898 Personal history of other specified conditions: Secondary | ICD-10-CM

## 2024-11-11 DIAGNOSIS — G4734 Idiopathic sleep related nonobstructive alveolar hypoventilation: Secondary | ICD-10-CM

## 2024-11-11 DIAGNOSIS — J449 Chronic obstructive pulmonary disease, unspecified: Secondary | ICD-10-CM

## 2024-11-11 DIAGNOSIS — J441 Chronic obstructive pulmonary disease with (acute) exacerbation: Secondary | ICD-10-CM

## 2024-11-11 LAB — CBC WITH DIFFERENTIAL/PLATELET
Basophils Absolute: 0 K/uL (ref 0.0–0.1)
Basophils Relative: 0.3 % (ref 0.0–3.0)
Eosinophils Absolute: 0.1 K/uL (ref 0.0–0.7)
Eosinophils Relative: 0.5 % (ref 0.0–5.0)
HCT: 44.5 % (ref 39.0–52.0)
Hemoglobin: 14.7 g/dL (ref 13.0–17.0)
Lymphocytes Relative: 20.6 % (ref 12.0–46.0)
Lymphs Abs: 3 K/uL (ref 0.7–4.0)
MCHC: 33 g/dL (ref 30.0–36.0)
MCV: 89.4 fl (ref 78.0–100.0)
Monocytes Absolute: 1.2 K/uL — ABNORMAL HIGH (ref 0.1–1.0)
Monocytes Relative: 8.1 % (ref 3.0–12.0)
Neutro Abs: 10.3 K/uL — ABNORMAL HIGH (ref 1.4–7.7)
Neutrophils Relative %: 70.5 % (ref 43.0–77.0)
Platelets: 251 K/uL (ref 150.0–400.0)
RBC: 4.98 Mil/uL (ref 4.22–5.81)
RDW: 14.8 % (ref 11.5–15.5)
WBC: 14.7 K/uL — ABNORMAL HIGH (ref 4.0–10.5)

## 2024-11-11 MED ORDER — AZITHROMYCIN 250 MG PO TABS: ORAL_TABLET | ORAL | 0 refills | Status: AC

## 2024-11-11 MED ORDER — PREDNISONE 10 MG PO TABS: ORAL_TABLET | ORAL | 0 refills | Status: AC

## 2024-11-11 NOTE — Progress Notes (Signed)
 OV 11/11/2024  Subjective:  Patient ID: George Leon, male , DOB: [birth year] , age 68 y.o. , MRN: [id number] , ADDRESS: [address] [place] [address] PCP Manon Jester, DO Patient Care Team: Manon Jester, DO as PCP - General (Family Medicine) Waddell Danelle ORN, MD as PCP - Electrophysiology (Cardiology) Michele Richardson, DO as PCP - Cardiology (Cardiology) [facility] Ladona Heinz, MD as Consulting Physician (Cardiology) Michele Richardson, DO as Consulting Physician (Cardiology) Aneita Gwendlyn DASEN, MD (Inactive) as Consulting Physician (Gastroenterology) Rosendo Norleen BROCKS, MD (Inactive) as Resident (Family Medicine)  This Provider for this visit: Treatment Team:  Attending Provider: Geronimo Amel, MD    11/11/2024 -   Chief Complaint  Patient presents with   Acute Visit    Pt stated o2 is dropping the other night it dropped to 79 Pt is currently on 2L of 02 as needed, pt wants to know if he should use it all the time instead of as needed SOB occurs w/ any activity Prod cough ( phlegm white bubbly some clear)     HPI George Leon 68 y.o. -George Leon is a 68 year old male with COPD, emphysema, and diastolic heart failure who presents with concerns about low blood oxygen levels.  His primary pulmonary patient of Dr. Ozell America.  He was scheduled for a colonoscopy last year, but the procedure was canceled due to low blood oxygen levels, which dropped to 89. Since then, he has experienced increasingly frequent episodes of low oxygen saturation, particularly in the evenings and sometimes upon waking in the morning. He monitors his oxygen levels with an oximeter and notes that they occasionally dip into the low eighties, causing fatigue. These episodes have been occurring for several months.  Approximately six months ago, he was hospitalized overnight for testing, which confirmed the need for supplemental oxygen. He currently uses  an oxygen concentrator and tanks at home. He receives calls from [employer] regarding the continued need for oxygen equipment, but he believes he still requires it due to ongoing low oxygen levels.  He has a history of pneumonia in 2016, during which his blood oxygen level dropped critically low, necessitating a propofol  coma. He also has a history of chronic bronchitis, with colds often leading to prolonged chest congestion lasting up to three months.  He is trying to quit smoking and has not yet undergone a sleep study, although his wife reports symptoms suggestive of sleep apnea. He has not seen a sleep doctor. No history of cancer but has hypertension, COPD, emphysema, and diastolic heart failure, for which he has a pacemaker.  No current fever or chills, although he had a fever last week due to a cold. He experiences wheezing and chest congestion.    CT Chest data from date: OCT 2023  - personally visualized and independently interpreted : NO - my findings are: as below Narrative & Impression  CLINICAL DATA:  Altered mental status, concern for pulmonary embolism.   EXAM: CT ANGIOGRAPHY CHEST WITH CONTRAST   TECHNIQUE: Multidetector CT imaging of the chest was performed using the standard protocol during bolus administration of intravenous contrast. Multiplanar CT image reconstructions and MIPs were obtained to evaluate the vascular anatomy.   RADIATION DOSE REDUCTION: This exam was performed according to the departmental dose-optimization program which includes automated exposure control, adjustment of the mA and/or kV according to patient size and/or use of iterative reconstruction technique.   CONTRAST:  70mL OMNIPAQUE  IOHEXOL   350 MG/ML SOLN   COMPARISON:  Same-day chest radiograph CT chest 11/28/2016   FINDINGS: Cardiovascular: There is adequate opacification of the pulmonary arteries. There is no evidence of pulmonary embolism. The heart size is normal. There is no  pericardial effusion. Mild coronary artery calcifications and calcified atherosclerotic plaque of the thoracic aorta is noted. There is a left chest wall cardiac device with leads terminating in the right atrium and right ventricle.   Mediastinum/Nodes: The thyroid  is unremarkable. The esophagus is grossly unremarkable. There is no mediastinal, hilar, or axillary lymphadenopathy.   Lungs/Pleura: The trachea and central airways are patent.   The lungs are well inflated. There is no focal consolidation or pulmonary edema. There is no pleural effusion or pneumothorax. There are no suspicious nodules.   Upper Abdomen: Hypodense left renal cysts are noted for which no specific imaging follow-up is required. A small focus of hyperdensity arising from the right kidney cortex may reflect a small hemorrhagic cyst.   Musculoskeletal: 22 there is no acute osseous abnormality or suspicious osseous lesion.   Review of the MIP images confirms the above findings.   IMPRESSION: 1. No evidence of pulmonary embolism or other acute cardiopulmonary pathology. 2. Small hyperdense lesion in the right kidney may reflect a small hemorrhagic cyst. Recommend nonemergent renal ultrasound for further evaluation.     Electronically Signed   By: Maude Harry M.D.   On: 09/18/2022 08:54       Latest Reference Range & Units 05/12/17 15:12 10/08/17 16:55  EOS (ABSOLUTE) 0.0 - 0.4 x10E3/uL 0.2 0.2    PFT     Latest Ref Rng & Units 11/29/2016    3:40 PM  PFT Results  FVC-Pre L 2.21   FVC-Predicted Pre % 51   FVC-Post L 2.30   FVC-Predicted Post % 53   Pre FEV1/FVC % % 76   Post FEV1/FCV % % 77   FEV1-Pre L 1.69   FEV1-Predicted Pre % 51   FEV1-Post L 1.77   DLCO uncorrected ml/min/mmHg 20.61   DLCO UNC% % 72   DLCO corrected ml/min/mmHg 22.36   DLCO COR %Predicted % 79   DLVA Predicted % 101   TLC L 6.07   TLC % Predicted % 94   RV % Predicted % 183        LAB RESULTS last 96  hours No results found.   eCHO 08/30/24   IMPRESSIONS     1. Left ventricular ejection fraction, by estimation, is 55 to 60%. The  left ventricle has normal function. The left ventricle has no regional  wall motion abnormalities. There is mild left ventricular hypertrophy.  Left ventricular diastolic parameters  were normal. The average left ventricular global longitudinal strain is  -23.4 %. The global longitudinal strain is normal.   2. Right ventricular systolic function is normal. The right ventricular  size is normal. Tricuspid regurgitation signal is inadequate for assessing  PA pressure.   3. The mitral valve is grossly normal. No evidence of mitral valve  regurgitation. No evidence of mitral stenosis.   4. The aortic valve was not well visualized. Aortic valve regurgitation  is not visualized. No aortic stenosis is present.   5. The inferior vena cava is normal in size with greater than 50%  respiratory variability, suggesting right atrial pressure of 3 mmHg.   Comparison(s): A prior study was performed on 07/03/2021. LVEF 60-65%,  Grade I diastolic dysfunction, mild MR, estimated RAP .    Allergies  Allergen  Reactions   Adhesive [Tape] Rash    Paper tape is what bothers him Pulled skin off also   Hydrochlorothiazide Other (See Comments)    Back Pain & gives him kidney stones   Sulfamethoxazole Swelling and Other (See Comments)    Tongue Swelling. Pt states he has had a sulfa drug since and has tolerated them.        has a past medical history of Acute MI (HCC), Acute on chronic diastolic heart failure (HCC) (87/76/7983), Anxiety, Arthritis, CHF (congestive heart failure) (HCC) (dx'd 2016), Chronic back pain, Chronic bronchitis (HCC), Chronic pain, Chronic pain syndrome (12/01/2015), Complete heart block (HCC), COPD (chronic obstructive pulmonary disease) (HCC) (03/03/2024), Dyspnea on exertion (03/03/2024), Emphysema of lung (HCC), Encounter for care of  pacemaker (07/04/2021), High cholesterol, High degree atrioventricular block, History of hiatal hernia, Hypertension, Liver fatty degeneration, Macular degeneration, right eye, Pacemaker Biotronik Edora dual chamber transmission  (07/04/2021), Pacemaker Dual chamber Biotronik Palacios 8dr-T Mri - [id number] pacemaker 07/04/2021 (07/04/2021), and Pneumonia ( 1990s X 1; 2016).   reports that he has been smoking cigarettes. He has a 11.3 pack-year smoking history. He quit smokeless tobacco use about 12 years ago.  His smokeless tobacco use included chew.  Past Surgical History:  Procedure Laterality Date   ANTERIOR CERVICAL DECOMP/DISCECTOMY FUSION  1988   plate; cadavar bone; couple bolts   BACK SURGERY     CARDIAC CATHETERIZATION     JOINT REPLACEMENT     KNEE ARTHROSCOPY Left 05/27/2017   Procedure: LEFT KNEE ARTHROSCOPY WITH PARTIAL MEDIAL MENISCECTOMY;  Surgeon: Vernetta Lonni GRADE, MD;  Location: [facility] OR;  Service: Orthopedics;  Laterality: Left;   LEFT HEART CATH AND CORONARY ANGIOGRAPHY N/A 07/03/2021   Procedure: LEFT HEART CATH AND CORONARY ANGIOGRAPHY;  Surgeon: Ladona Heinz, MD;  Location: [facility] INVASIVE CV LAB;  Service: Cardiovascular;  Laterality: N/A;   PACEMAKER IMPLANT N/A 07/04/2021   Procedure: PACEMAKER IMPLANT;  Surgeon: Waddell Danelle ORN, MD;  Location: [facility] INVASIVE CV LAB;  Service: Cardiovascular;  Laterality: N/A;   SHOULDER ARTHROSCOPY WITH ROTATOR CUFF REPAIR Right    TOTAL HIP ARTHROPLASTY Left 08/05/2017   Procedure: LEFT TOTAL HIP ARTHROPLASTY ANTERIOR APPROACH;  Surgeon: Vernetta Lonni GRADE, MD;  Location: [facility] OR;  Service: Orthopedics;  Laterality: Left;    Allergies  Allergen Reactions   Adhesive [Tape] Rash    Paper tape is what bothers him Pulled skin off also   Hydrochlorothiazide Other (See Comments)    Back Pain & gives him kidney stones   Sulfamethoxazole Swelling and Other (See Comments)    Tongue Swelling. Pt states he has had a sulfa drug since and has  tolerated them.    Immunization History  Administered Date(s) Administered   Fluad Quad(high Dose 65+) 09/27/2022   Fluad Trivalent(High Dose 65+) 10/31/2023   Influenza Split 09/19/2011, 08/25/2012   Influenza,inj,Quad PF,6+ Mos 08/23/2013, 12/23/2014, 01/18/2016, 08/16/2016, 08/04/2017, 09/21/2018, 11/22/2019, 10/30/2020, 10/01/2021   PFIZER Comirnaty(Gray Top)Covid-19 Tri-Sucrose Vaccine 04/02/2021   PFIZER(Purple Top)SARS-COV-2 Vaccination 05/09/2020, 05/30/2020   PNEUMOCOCCAL CONJUGATE-20 07/23/2021   Pfizer Covid-19 Vaccine Bivalent Booster 11yrs & up 10/01/2021   Pfizer(Comirnaty)Fall Seasonal Vaccine 12 years and older 10/31/2023   Pneumococcal Polysaccharide-23 10/16/2012, 11/16/2018   Td 06/13/2005   Tdap 09/15/2012    Family History  Problem Relation Age of Onset   Hypertension Father    AAA (abdominal aortic aneurysm) Father    Colon cancer Neg Hx    Esophageal cancer Neg Hx    Colon polyps Neg Hx  Rectal cancer Neg Hx    Stomach cancer Neg Hx      Current Outpatient Medications:    albuterol  (VENTOLIN  HFA) 108 (90 Base) MCG/ACT inhaler, USE 1 TO 2 INHALATIONS EVERY 4 HOURS AS NEEDED FOR SHORTNESS OF BREATH/WHEEZE/COUGH., Disp: 17 g, Rfl: 4   aspirin  EC 81 MG tablet, Take 1 tablet (81 mg total) by mouth daily. Swallow whole., Disp: 30 tablet, Rfl: 12   atorvastatin  (LIPITOR ) 80 MG tablet, TAKE 1 TABLET BY MOUTH DAILY, Disp: 90 tablet, Rfl: 3   azithromycin  (ZITHROMAX ) 250 MG tablet, Five hundred milligrams azithromycin  on day 1 followed by 250 mg daily azithromycin  x 4 days for total 5 days, Disp: 6 tablet, Rfl: 0   diclofenac  Sodium (VOLTAREN ) 1 % GEL, Apply 2 g topically 4 (four) times daily. (Patient taking differently: Apply 2 g topically daily as needed (for pain).), Disp: 2 g, Rfl: 11   DULoxetine  (CYMBALTA ) 60 MG capsule, TAKE 1 CAPSULE BY MOUTH DAILY, Disp: 90 capsule, Rfl: 3   Elastic Bandages & Supports (MEDICAL COMPRESSION STOCKINGS) MISC, 1 each by Does  not apply route once., Disp: 2 each, Rfl: 0   empagliflozin  (JARDIANCE ) 10 MG TABS tablet, Take 1 tablet (10 mg total) by mouth daily before breakfast., Disp: 90 tablet, Rfl: 3   esomeprazole  (NEXIUM ) 40 MG capsule, TAKE 1 CAPSULE DAILY AT 12 NOON, Disp: 90 capsule, Rfl: 3   ezetimibe  (ZETIA ) 10 MG tablet, Take 1 tablet (10 mg total) by mouth every evening., Disp: 90 tablet, Rfl: 2   Fluticasone -Umeclidin-Vilant (TRELEGY ELLIPTA ) 100-62.5-25 MCG/ACT AEPB, Inhale 1 puff into the lungs daily., Disp: 1 each, Rfl: 2   HYDROcodone -acetaminophen  (NORCO) 7.5-325 MG tablet, Take 1 tablet by mouth every 6 (six) hours as needed for moderate pain (pain score 4-6)., Disp: 120 tablet, Rfl: 0   metoprolol  succinate (TOPROL -XL) 100 MG 24 hr tablet, TAKE 1 TABLET(100 MG) BY MOUTH EVERY MORNING WITH OR IMMEDIATELY FOLLOWING A MEAL, Disp: 90 tablet, Rfl: 0   Multiple Vitamins-Minerals (OCUVITE ADULT FORMULA) CAPS, Take 1 capsule by mouth daily. (Patient taking differently: Take 1 capsule by mouth 2 (two) times daily.), Disp: 90 capsule, Rfl: 3   nystatin  cream (MYCOSTATIN ), APPLY TOPICALLY TWICE A DAY (Patient taking differently: Apply 1 Application topically 2 (two) times daily as needed for dry skin.), Disp: 30 g, Rfl: 23   predniSONE  (DELTASONE ) 10 MG tablet, Please take prednisone  40 mg x1 day, then 30 mg x1 day, then 20 mg x1 day, then 10 mg x1 day, and then 5 mg x1 day and stop, Disp: 20 tablet, Rfl: 0   sacubitril -valsartan  (ENTRESTO ) 49-51 MG, Take 1 tablet by mouth 2 (two) times daily., Disp: 180 tablet, Rfl: 3   selenium  sulfide (SELSUN ) 2.5 % shampoo, APPLY 1 APPLICATION TOPICALLY DAILY AS NEEDED FOR IRRITATION, Disp: 120 mL, Rfl: 3   sodium chloride  (OCEAN) 0.65 % SOLN nasal spray, Place 1 spray into both nostrils as needed for congestion., Disp: , Rfl:    spironolactone  (ALDACTONE ) 25 MG tablet, Take 1 tablet (25 mg total) by mouth daily., Disp: 90 tablet, Rfl: 2   fluorouracil  (EFUDEX ) 5 % cream, Apply  topically 2 (two) times daily. (Patient not taking: Reported on 11/11/2024), Disp: 40 g, Rfl: 0   naloxone  (NARCAN ) nasal spray 4 mg/0.1 mL, Please administered to nostrils for decreased responsiveness or decreased breathing effort (Patient not taking: Reported on 11/11/2024), Disp: 1 each, Rfl: 3      Objective:   Vitals:   11/11/24 1452  BP: 116/76  Pulse: 99  Temp: 98 F (36.7 C)  TempSrc: Oral  SpO2: 91%  Weight: 262 lb 6.4 oz (119 kg)  Height: 5' 9 (1.753 m)    Estimated body mass index is 38.75 kg/m as calculated from the following:   Height as of this encounter: 5' 9 (1.753 m).   Weight as of this encounter: 262 lb 6.4 oz (119 kg).  @WEIGHTCHANGE @  Filed Weights   11/11/24 1452  Weight: 262 lb 6.4 oz (119 kg)     Physical Exam   General: No distress. obese O2 at rest: no Cane present: yes Sitting in wheel chair: no Frail: no Obese: yes Neuro: Alert and Oriented x 3. GCS 15. Speech normal Psych: Pleasant Resp:  Barrel Chest - no.  Wheeze -  YES V JUNKY SOUNDING, Crackles - no, No overt respiratory distress CVS: Normal heart sounds. Murmurs - no Ext: Stigmata of Connective Tissue Disease - no HEENT: Normal upper airway. PEERL +. No post nasal drip        Assessment/     Assessment & Plan COPD exacerbation (HCC)  Stage 2 moderate COPD by GOLD classification (HCC)  Wheezing  Smoking history  DOE (dyspnea on exertion)  History of snoring  Witnessed apneic spells  Nocturnal hypoxemia    PLAN Patient Instructions   #COPD flare up  Plan - check blood cbc with diff and IgE to help target future treatment options  - Please take prednisone  40 mg x1 day, then 30 mg x1 day, then 20 mg x1 day, then 10 mg x1 day, and then 5 mg x1 day and stop - Z Pak  #SNoring and night o2 need  Plan  - refer sleep doc  #COPD #smoking hisotyr #shortness of breath  Plan  - do HRCT - do pft  Followup Dr Darlean in  2 months    FOLLOWUP     Return for Dr Darlean in  2 months.    SIGNATURE    Dr. Dorethia Cave, M.D., F.C.C.P,  Pulmonary and Critical Care Medicine Staff Physician, [facility] Director - Interstitial Lung Disease  Program  Pulmonary Fibrosis [facility] at [facility] [place], [place], [address]  Pager: [phone], If no answer or between  15:00h - 7:00h: call [phone] Telephone: [phone]  4:51 PM 11/11/2024

## 2024-11-11 NOTE — Patient Instructions (Addendum)
#  COPD flare up  Plan - check blood cbc with diff and IgE to help target future treatment options  - Please take prednisone  40 mg x1 day, then 30 mg x1 day, then 20 mg x1 day, then 10 mg x1 day, and then 5 mg x1 day and stop - Z Pak  #SNoring and night o2 need  Plan  - refer sleep doc  #COPD #smoking hisotyr #shortness of breath  Plan  - do HRCT - do pft  Followup Dr Darlean in  2 months

## 2024-11-12 LAB — IGE: IgE (Immunoglobulin E), Serum: 218 kU/L — ABNORMAL HIGH (ref <=114)

## 2024-11-19 MED ORDER — HYDROCODONE-ACETAMINOPHEN 7.5-325 MG PO TABS: 1.0000 | ORAL_TABLET | Freq: Four times a day (QID) | ORAL | 0 refills | Status: DC | PRN

## 2024-11-26 ENCOUNTER — Ambulatory Visit (HOSPITAL_BASED_OUTPATIENT_CLINIC_OR_DEPARTMENT_OTHER)
Admission: RE | Admit: 2024-11-26 | Discharge: 2024-11-26 | Disposition: A | Discharge status: Home / Self Care | Source: Ambulatory Visit | Attending: Internal Medicine | Admitting: Internal Medicine

## 2024-11-26 DIAGNOSIS — R0609 Other forms of dyspnea: Secondary | ICD-10-CM | POA: Diagnosis present

## 2024-11-26 DIAGNOSIS — J441 Chronic obstructive pulmonary disease with (acute) exacerbation: Secondary | ICD-10-CM | POA: Insufficient documentation

## 2024-11-26 DIAGNOSIS — Z87898 Personal history of other specified conditions: Secondary | ICD-10-CM | POA: Diagnosis present

## 2024-11-26 DIAGNOSIS — R0681 Apnea, not elsewhere classified: Secondary | ICD-10-CM | POA: Insufficient documentation

## 2024-11-26 DIAGNOSIS — G4734 Idiopathic sleep related nonobstructive alveolar hypoventilation: Secondary | ICD-10-CM | POA: Diagnosis present

## 2024-11-26 DIAGNOSIS — R062 Wheezing: Secondary | ICD-10-CM | POA: Diagnosis present

## 2024-11-26 DIAGNOSIS — J449 Chronic obstructive pulmonary disease, unspecified: Secondary | ICD-10-CM | POA: Insufficient documentation

## 2024-11-26 DIAGNOSIS — Z87891 Personal history of nicotine dependence: Secondary | ICD-10-CM | POA: Insufficient documentation

## 2024-11-28 ENCOUNTER — Other Ambulatory Visit: Payer: Self-pay

## 2024-11-30 ENCOUNTER — Other Ambulatory Visit: Payer: Self-pay

## 2024-11-30 DIAGNOSIS — I5032 Chronic diastolic (congestive) heart failure: Secondary | ICD-10-CM

## 2024-12-01 ENCOUNTER — Ambulatory Visit: Payer: Self-pay | Admitting: Internal Medicine

## 2024-12-01 NOTE — Progress Notes (Signed)
 Several non-emergent  and chronic fidnings. Pease discuss with Tammy the APP at followup Xxxx  IMPRESSION: 1. No evidence of interstitial lung disease. 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 2. Minimal patchy ground-glass in the anterior segment left upper lobe, likely infectious/inflammatory in etiology. Recommend follow-up CT chest without contrast in 3 months to ensure resolution. 3. Hepatic steatosis. 4. Bilateral adrenal adenomas. 5. Aortic atherosclerosis (ICD10-I70.0). Coronary artery calcification.     Electronically Signed   By: Newell Eke M.D.   On: 11/27/2024 17:35

## 2024-12-16 ENCOUNTER — Ambulatory Visit: Payer: Medicare Other

## 2024-12-16 DIAGNOSIS — I5032 Chronic diastolic (congestive) heart failure: Secondary | ICD-10-CM | POA: Diagnosis not present

## 2024-12-17 LAB — CUP PACEART REMOTE DEVICE CHECK
Battery Voltage: 65
Date Time Interrogation Session: 20260108073509
Implantable Lead Connection Status: 753985
Implantable Lead Connection Status: 753985
Implantable Lead Implant Date: 20220727
Implantable Lead Implant Date: 20220727
Implantable Lead Location: 753859
Implantable Lead Location: 753860
Implantable Lead Model: 377171
Implantable Lead Model: 377171
Implantable Lead Serial Number: 8000456432
Implantable Lead Serial Number: 8000477614
Implantable Pulse Generator Implant Date: 20220727
Pulse Gen Model: 407145
Pulse Gen Serial Number: 70164524

## 2024-12-18 ENCOUNTER — Ambulatory Visit: Payer: Self-pay | Admitting: Cardiology

## 2024-12-18 MED ORDER — HYDROCODONE-ACETAMINOPHEN 7.5-325 MG PO TABS: 1.0000 | ORAL_TABLET | Freq: Four times a day (QID) | ORAL | 0 refills | Status: AC | PRN

## 2024-12-21 NOTE — Progress Notes (Signed)
 Remote PPM Transmission

## 2025-01-04 ENCOUNTER — Other Ambulatory Visit: Payer: Self-pay

## 2025-01-04 DIAGNOSIS — G894 Chronic pain syndrome: Secondary | ICD-10-CM

## 2025-01-04 DIAGNOSIS — M545 Low back pain, unspecified: Secondary | ICD-10-CM

## 2025-01-04 MED ORDER — DULOXETINE HCL 60 MG PO CPEP: 60.0000 mg | ORAL_CAPSULE | Freq: Every day | ORAL | 3 refills | Status: AC

## 2025-01-06 ENCOUNTER — Ambulatory Visit: Admitting: Adult Health

## 2025-01-24 ENCOUNTER — Encounter

## 2025-01-24 ENCOUNTER — Ambulatory Visit: Payer: Self-pay

## 2025-02-17 ENCOUNTER — Ambulatory Visit: Admitting: Adult Health

## 2025-03-28 ENCOUNTER — Ambulatory Visit: Admitting: Physician Assistant
